# Patient Record
Sex: Female | Born: 1935 | Race: White | Hispanic: No | Smoking: Former smoker
Health system: Southern US, Community
[De-identification: ages and names within clinical notes are randomized; demographics above are authoritative.]

## PROBLEM LIST (undated history)

## (undated) DIAGNOSIS — I2510 Atherosclerotic heart disease of native coronary artery without angina pectoris: Secondary | ICD-10-CM

## (undated) DIAGNOSIS — E785 Hyperlipidemia, unspecified: Secondary | ICD-10-CM

## (undated) DIAGNOSIS — G4730 Sleep apnea, unspecified: Secondary | ICD-10-CM

## (undated) DIAGNOSIS — Z8639 Personal history of other endocrine, nutritional and metabolic disease: Secondary | ICD-10-CM

## (undated) DIAGNOSIS — I10 Essential (primary) hypertension: Secondary | ICD-10-CM

## (undated) DIAGNOSIS — R0600 Dyspnea, unspecified: Secondary | ICD-10-CM

## (undated) DIAGNOSIS — A0811 Acute gastroenteropathy due to Norwalk agent: Secondary | ICD-10-CM

## (undated) DIAGNOSIS — I341 Nonrheumatic mitral (valve) prolapse: Secondary | ICD-10-CM

## (undated) DIAGNOSIS — I779 Disorder of arteries and arterioles, unspecified: Secondary | ICD-10-CM

## (undated) DIAGNOSIS — IMO0002 Reserved for concepts with insufficient information to code with codable children: Secondary | ICD-10-CM

## (undated) DIAGNOSIS — M4802 Spinal stenosis, cervical region: Secondary | ICD-10-CM

## (undated) DIAGNOSIS — I639 Cerebral infarction, unspecified: Secondary | ICD-10-CM

## (undated) DIAGNOSIS — I739 Peripheral vascular disease, unspecified: Secondary | ICD-10-CM

## (undated) DIAGNOSIS — R001 Bradycardia, unspecified: Secondary | ICD-10-CM

## (undated) HISTORY — PX: CAROTID ENDARTERECTOMY: SUR193

## (undated) HISTORY — DX: Reserved for concepts with insufficient information to code with codable children: IMO0002

## (undated) HISTORY — PX: CHOLECYSTECTOMY: SHX55

## (undated) HISTORY — PX: CORONARY ARTERY BYPASS GRAFT: SHX141

## (undated) HISTORY — PX: ABDOMINAL HYSTERECTOMY: SUR658

## (undated) HISTORY — DX: Hyperlipidemia, unspecified: E78.5

## (undated) HISTORY — DX: Nonrheumatic mitral (valve) prolapse: I34.1

## (undated) HISTORY — DX: Atherosclerotic heart disease of native coronary artery without angina pectoris: I25.10

## (undated) HISTORY — DX: Bradycardia, unspecified: R00.1

## (undated) HISTORY — DX: Essential (primary) hypertension: I10

## (undated) HISTORY — DX: Acute gastroenteropathy due to Norwalk agent: A08.11

## (undated) HISTORY — DX: Spinal stenosis, cervical region: M48.02

## (undated) HISTORY — DX: Cerebral infarction, unspecified: I63.9

## (undated) HISTORY — DX: Personal history of other endocrine, nutritional and metabolic disease: Z86.39

## (undated) HISTORY — PX: BLADDER SUSPENSION: SHX72

---

## 2004-03-29 ENCOUNTER — Inpatient Hospital Stay: Payer: Self-pay | Admitting: Internal Medicine

## 2004-09-05 ENCOUNTER — Inpatient Hospital Stay (HOSPITAL_COMMUNITY): Admission: AD | Admit: 2004-09-05 | Discharge: 2004-09-07 | Payer: Self-pay | Admitting: Cardiology

## 2004-09-05 ENCOUNTER — Ambulatory Visit: Payer: Self-pay

## 2004-09-05 ENCOUNTER — Ambulatory Visit: Payer: Self-pay | Admitting: *Deleted

## 2004-09-22 ENCOUNTER — Ambulatory Visit: Payer: Self-pay | Admitting: Cardiology

## 2005-01-28 ENCOUNTER — Emergency Department: Payer: Self-pay | Admitting: Emergency Medicine

## 2005-04-21 ENCOUNTER — Ambulatory Visit: Payer: Self-pay | Admitting: Cardiology

## 2006-01-16 ENCOUNTER — Ambulatory Visit: Payer: Self-pay | Admitting: Cardiology

## 2006-01-29 ENCOUNTER — Ambulatory Visit: Payer: Self-pay | Admitting: Unknown Physician Specialty

## 2006-02-26 ENCOUNTER — Ambulatory Visit: Payer: Self-pay

## 2006-04-03 ENCOUNTER — Ambulatory Visit: Payer: Self-pay | Admitting: Cardiology

## 2006-04-11 ENCOUNTER — Ambulatory Visit (HOSPITAL_COMMUNITY): Admission: RE | Admit: 2006-04-11 | Discharge: 2006-04-11 | Payer: Self-pay | Admitting: Cardiology

## 2006-04-11 ENCOUNTER — Ambulatory Visit: Payer: Self-pay | Admitting: Cardiology

## 2006-05-02 ENCOUNTER — Ambulatory Visit: Payer: Self-pay | Admitting: Cardiology

## 2006-10-02 ENCOUNTER — Ambulatory Visit: Payer: Self-pay | Admitting: Unknown Physician Specialty

## 2006-10-29 ENCOUNTER — Ambulatory Visit: Payer: Self-pay | Admitting: Cardiology

## 2006-10-29 LAB — CONVERTED CEMR LAB
AST: 27 units/L (ref 0–37)
Chloride: 108 meq/L (ref 96–112)
Cholesterol: 140 mg/dL (ref 0–200)
GFR calc non Af Amer: 46 mL/min
Glucose, Bld: 104 mg/dL — ABNORMAL HIGH (ref 70–99)
HDL: 39.2 mg/dL (ref >39.0)
LDL Cholesterol: 73 mg/dL (ref 0–99)
Sodium: 143 meq/L (ref 135–145)
Total Bilirubin: 0.8 mg/dL (ref 0.3–1.2)
Total CHOL/HDL Ratio: 3.6
Total Protein: 7.4 g/dL (ref 6.0–8.3)
Triglycerides: 137 mg/dL (ref 0–149)

## 2006-12-28 ENCOUNTER — Ambulatory Visit: Payer: Self-pay | Admitting: Family Medicine

## 2007-01-28 ENCOUNTER — Ambulatory Visit: Payer: Self-pay | Admitting: Family Medicine

## 2007-01-31 ENCOUNTER — Ambulatory Visit: Payer: Self-pay | Admitting: Family Medicine

## 2007-02-13 ENCOUNTER — Ambulatory Visit: Payer: Self-pay | Admitting: Family Medicine

## 2007-02-14 ENCOUNTER — Ambulatory Visit: Payer: Self-pay | Admitting: Family Medicine

## 2007-04-23 ENCOUNTER — Ambulatory Visit: Payer: Self-pay | Admitting: Cardiology

## 2007-04-25 ENCOUNTER — Ambulatory Visit: Payer: Self-pay | Admitting: Cardiology

## 2007-07-30 ENCOUNTER — Ambulatory Visit: Payer: Self-pay | Admitting: Cardiology

## 2007-07-30 LAB — CONVERTED CEMR LAB
ALT: 16 units/L (ref 0–35)
Albumin: 4.2 g/dL (ref 3.5–5.2)
Alkaline Phosphatase: 110 units/L (ref 39–117)
Cholesterol: 131 mg/dL (ref 0–200)
LDL Cholesterol: 56 mg/dL (ref 0–99)
Triglycerides: 114 mg/dL (ref <150)

## 2007-10-17 ENCOUNTER — Ambulatory Visit: Payer: Self-pay | Admitting: Family Medicine

## 2007-11-08 ENCOUNTER — Ambulatory Visit: Payer: Self-pay | Admitting: Cardiology

## 2007-11-16 ENCOUNTER — Other Ambulatory Visit: Payer: Self-pay

## 2007-11-16 ENCOUNTER — Emergency Department: Payer: Self-pay | Admitting: Emergency Medicine

## 2007-12-18 ENCOUNTER — Ambulatory Visit: Payer: Self-pay | Admitting: Cardiology

## 2007-12-18 LAB — CONVERTED CEMR LAB
ALT: 20 U/L
AST: 25 U/L
Albumin: 4.3 g/dL
Alkaline Phosphatase: 102 U/L
BUN: 34 mg/dL — ABNORMAL HIGH
Bilirubin, Direct: 0.1 mg/dL
CO2: 23 meq/L
Calcium: 9.6 mg/dL
Chloride: 106 meq/L
Cholesterol: 138 mg/dL
Creatinine, Ser: 1.16 mg/dL
Glucose, Bld: 105 mg/dL — ABNORMAL HIGH
HDL: 45 mg/dL
Indirect Bilirubin: 0.4 mg/dL
LDL Cholesterol: 58 mg/dL
Potassium: 5.1 meq/L
Sodium: 142 meq/L
Total Bilirubin: 0.5 mg/dL
Total CHOL/HDL Ratio: 3.1
Total Protein: 7.3 g/dL
Triglycerides: 176 mg/dL — ABNORMAL HIGH
VLDL: 35 mg/dL

## 2008-03-27 ENCOUNTER — Ambulatory Visit: Payer: Self-pay | Admitting: Family Medicine

## 2008-04-28 ENCOUNTER — Ambulatory Visit: Payer: Self-pay | Admitting: Cardiovascular Disease

## 2008-06-23 ENCOUNTER — Ambulatory Visit: Payer: Federal, State, Local not specified - PPO | Admitting: Family Medicine

## 2008-07-27 ENCOUNTER — Ambulatory Visit: Payer: Self-pay | Admitting: Internal Medicine

## 2008-07-27 ENCOUNTER — Encounter: Payer: Self-pay | Admitting: Cardiovascular Disease

## 2008-07-27 LAB — CONVERTED CEMR LAB
Albumin: 4.3 g/dL (ref 3.5–5.2)
Alkaline Phosphatase: 105 units/L (ref 39–117)
Bilirubin, Direct: 0.1 mg/dL (ref 0.0–0.3)
HDL: 42 mg/dL (ref >39)
LDL Cholesterol: 63 mg/dL (ref 0–99)
Total Protein: 7.2 g/dL (ref 6.0–8.3)
Triglycerides: 209 mg/dL — ABNORMAL HIGH (ref <150)

## 2008-10-14 ENCOUNTER — Ambulatory Visit: Payer: Federal, State, Local not specified - PPO | Admitting: Family Medicine

## 2008-10-26 ENCOUNTER — Ambulatory Visit: Payer: Self-pay | Admitting: Cardiovascular Disease

## 2008-11-11 ENCOUNTER — Ambulatory Visit: Payer: Federal, State, Local not specified - PPO | Admitting: Family Medicine

## 2009-04-19 ENCOUNTER — Encounter (INDEPENDENT_AMBULATORY_CARE_PROVIDER_SITE_OTHER): Payer: Self-pay | Admitting: *Deleted

## 2009-05-20 ENCOUNTER — Encounter: Payer: Self-pay | Admitting: Cardiovascular Disease

## 2009-05-25 ENCOUNTER — Ambulatory Visit: Payer: Self-pay | Admitting: Cardiovascular Disease

## 2009-05-25 DIAGNOSIS — I25718 Atherosclerosis of autologous vein coronary artery bypass graft(s) with other forms of angina pectoris: Secondary | ICD-10-CM

## 2009-05-25 DIAGNOSIS — R0609 Other forms of dyspnea: Secondary | ICD-10-CM

## 2009-05-26 ENCOUNTER — Encounter: Payer: Self-pay | Admitting: Cardiovascular Disease

## 2009-05-28 LAB — CONVERTED CEMR LAB
CO2: 24 meq/L (ref 19–32)
Calcium: 9.8 mg/dL (ref 8.4–10.5)
Chloride: 103 meq/L (ref 96–112)
Sodium: 139 meq/L (ref 135–145)

## 2009-05-31 ENCOUNTER — Telehealth (INDEPENDENT_AMBULATORY_CARE_PROVIDER_SITE_OTHER): Payer: Self-pay | Admitting: *Deleted

## 2009-06-01 ENCOUNTER — Ambulatory Visit: Payer: Self-pay | Admitting: Cardiovascular Disease

## 2009-06-01 ENCOUNTER — Encounter (HOSPITAL_COMMUNITY): Admission: RE | Admit: 2009-06-01 | Discharge: 2009-06-17 | Payer: Self-pay | Admitting: Cardiovascular Disease

## 2009-06-01 ENCOUNTER — Ambulatory Visit: Payer: Self-pay

## 2009-06-01 ENCOUNTER — Encounter: Payer: Self-pay | Admitting: Cardiovascular Disease

## 2009-06-01 ENCOUNTER — Ambulatory Visit (HOSPITAL_COMMUNITY): Admission: RE | Admit: 2009-06-01 | Discharge: 2009-06-01 | Payer: Self-pay | Admitting: Cardiology

## 2009-06-08 ENCOUNTER — Ambulatory Visit: Payer: Self-pay | Admitting: Cardiovascular Disease

## 2009-06-09 LAB — CONVERTED CEMR LAB
BUN: 24 mg/dL — ABNORMAL HIGH (ref 6–23)
CO2: 16 meq/L — ABNORMAL LOW (ref 19–32)
Chloride: 107 meq/L (ref 96–112)
Glucose, Bld: 129 mg/dL — ABNORMAL HIGH (ref 70–99)
Pro B Natriuretic peptide (BNP): 48.1 pg/mL (ref 0.0–100.0)
Sodium: 141 meq/L (ref 135–145)

## 2009-06-17 ENCOUNTER — Ambulatory Visit: Payer: Federal, State, Local not specified - PPO | Admitting: Surgery

## 2009-06-21 ENCOUNTER — Ambulatory Visit: Payer: Self-pay | Admitting: Cardiovascular Disease

## 2009-06-22 ENCOUNTER — Encounter: Payer: Self-pay | Admitting: Cardiovascular Disease

## 2009-06-22 LAB — CONVERTED CEMR LAB
Lymphs Abs: 2.9 10*3/uL
MCHC: 32.7 g/dL
MCV: 92 fL
Monocytes Absolute: 0.9 10*3/uL
Neutro Abs: 5.4 10*3/uL

## 2009-08-23 ENCOUNTER — Ambulatory Visit: Payer: Federal, State, Local not specified - PPO | Admitting: Family Medicine

## 2009-10-12 ENCOUNTER — Ambulatory Visit: Payer: Federal, State, Local not specified - PPO | Admitting: Surgery

## 2009-11-16 ENCOUNTER — Telehealth: Payer: Self-pay | Admitting: Cardiovascular Disease

## 2009-11-22 ENCOUNTER — Telehealth (INDEPENDENT_AMBULATORY_CARE_PROVIDER_SITE_OTHER): Payer: Self-pay | Admitting: *Deleted

## 2009-11-22 ENCOUNTER — Telehealth: Payer: Self-pay | Admitting: Cardiovascular Disease

## 2009-11-24 ENCOUNTER — Telehealth: Payer: Self-pay | Admitting: Cardiovascular Disease

## 2009-12-21 ENCOUNTER — Ambulatory Visit: Payer: Self-pay

## 2009-12-21 ENCOUNTER — Encounter: Payer: Self-pay | Admitting: Cardiology

## 2009-12-21 DIAGNOSIS — I6529 Occlusion and stenosis of unspecified carotid artery: Secondary | ICD-10-CM

## 2010-01-21 ENCOUNTER — Encounter: Payer: Self-pay | Admitting: Cardiovascular Disease

## 2010-01-24 ENCOUNTER — Encounter: Payer: Self-pay | Admitting: Cardiovascular Disease

## 2010-01-31 ENCOUNTER — Ambulatory Visit: Payer: Self-pay | Admitting: Cardiovascular Disease

## 2010-01-31 DIAGNOSIS — R42 Dizziness and giddiness: Secondary | ICD-10-CM

## 2010-01-31 DIAGNOSIS — E785 Hyperlipidemia, unspecified: Secondary | ICD-10-CM

## 2010-02-01 ENCOUNTER — Encounter: Payer: Self-pay | Admitting: Cardiovascular Disease

## 2010-06-30 ENCOUNTER — Encounter: Payer: Self-pay | Admitting: Cardiovascular Disease

## 2010-07-17 LAB — CONVERTED CEMR LAB
ALT: 19 units/L
AST: 25 units/L
Alkaline Phosphatase: 127 units/L
BUN: 36 mg/dL
Calcium: 10.2 mg/dL
Chloride: 102 meq/L
Glomerular Filtration Rate, Af Am: 49 mL/min/{1.73_m2}
Total Bilirubin: 0.3 mg/dL
Total Protein: 7.4 g/dL

## 2010-07-19 NOTE — Miscellaneous (Signed)
Summary: Orders Update  Clinical Lists Changes  Problems: Added new problem of CAROTID ARTERY DISEASE (ICD-433.10) Orders: Added new Test order of Carotid Duplex (Carotid Duplex) - Signed 

## 2010-07-19 NOTE — Assessment & Plan Note (Signed)
Summary: F1M/AMD  Medications Added D 1000 PLUS  TABS (FA-CYANOCOBALAMIN-B6-D-CA) 2 by mouth daily LIPITOR 80 MG TABS (ATORVASTATIN CALCIUM) Take one tablet by mouth daily.      Allergies Added:   Visit Type:  Follow-up Primary Provider:  Dr.Maloney  CC:  SOB somewhat improved since starting spiriva and symbicort; lightheaded and dizzy on occasion, infected navel- resolving, and lump in L breast.  History of Present Illness: this is an 77 year-old woman with CAD s/p CABG presenting today for follow-up evaluation. She presented last month with progressive dyspnea and was evaluated with an echo and myoview stress test. The echo showed normal LV function without valvular abnormalities and the stress test was negative for ischemia.   She has been started on spiriva and symbicort and reports improvement in her breathing. Denies chest pain or edema. No other complaints at present.  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  Current Medications (verified): 1)  Amlodipine Besylate 10 Mg Tabs (Amlodipine Besylate) .... Take One Tablet By Mouth Daily 2)  Prevacid 30 Mg Cpdr (Lansoprazole) .Marland Kitchen.. 1 By Mouth Daily 3)  Detrol La 4 Mg Xr24h-Cap (Tolterodine Tartrate) .Marland Kitchen.. 1 By Mouth Daily 4)  Levothyroxine Sodium 25 Mcg Tabs (Levothyroxine Sodium) .Marland Kitchen.. 1 By Mouth Daily 5)  Sertraline Hcl 50 Mg Tabs (Sertraline Hcl) .Marland Kitchen.. 1 By Mouth Daily 6)  Aspirin Ec 325 Mg Tbec (Aspirin) .... Take One Tablet By Mouth Daily 7)  Calcium Citrate +  Tabs (Multiple Minerals-Vitamins) .Marland Kitchen.. 1 By Mouth Daily 8)  Centrum Silver  Tabs (Multiple Vitamins-Minerals) .Marland Kitchen.. 1 By Mouth Daily 9)  Ocuvite Preservision  Tabs (Multiple Vitamins-Minerals) .Marland Kitchen.. 1 By Mouth Daily 10)  Coenzyme Q10 100 Mg Caps (Coenzyme Q10) .Marland Kitchen.. 1 By Mouth Daily 11)  D 1000 Plus  Tabs (Fa-Cyanocobalamin-B6-D-Ca) .... 2 By Mouth Daily 12)  Lipitor 80 Mg Tabs (Atorvastatin Calcium) .... Take One Tablet By Mouth Daily. 13)   Nitroglycerine Er 2.5mg  Capsule .... Take One Capsule P.o. Two Times A Day 14)  Nitroglycerin 0.4 Mg/hr Pt24 (Nitroglycerin) .... As Needed 15)  Diazepam 5 Mg Tabs (Diazepam) .... As Needed 16)  Symbicort 80-4.5 Mcg/act Aero (Budesonide-Formoterol Fumarate) .... Use 1 Puff Every Morning and Every Evening 17)  Spiriva Handihaler 18 Mcg Caps (Tiotropium Bromide Monohydrate) .... Once Daily 18)  Furosemide 40 Mg Tabs (Furosemide) .... Take One Tablet By Mouth Daily. 19)  Klor-Con M20 20 Meq Cr-Tabs (Potassium Chloride Crys Cr) .Marland Kitchen.. 1 Tab By Mouth Daily  Allergies (verified): 1)  ! Sulfa  Past History:  Past medical history reviewed for relevance to current acute and chronic problems.  Past Medical History: Reviewed history from 05/25/2009 and no changes required. CAD status post CABG Hyperlipidemia Hypertension History of fatigue and bradycardia with beta blockade. History of hyperkalemia Cerebrovascular Disease status post carotid endarterectomy Left brain stroke in the 1980's Mitral Valve Prolapse Osteoporsis Asthma  Vital Signs:  Patient profile:   77 year old female Weight:      186.25 pounds Pulse rate:   68 / minute Pulse rhythm:   regular Resp:     16 per minute BP sitting:   122 / 56  (right arm) Cuff size:   large  Vitals Entered By: Charlena Cross, RN, BSN (June 21, 2009 2:19 PM)  Physical Exam  General:  Pt is alert and oriented, elderly woman, in no acute distress. HEENT: normal Neck: normal carotid upstrokes without bruits, JVP normal Lungs: CTA CV: RRR without murmur  or gallop Abd: soft, NT, positive BS, no bruit, no organomegaly Ext: no clubbing, cyanosis, or edema. peripheral pulses 2+ and equal Skin: warm and dry without rash    Impression & Recommendations:  Problem # 1:  DYSPNEA ON EXERTION (ICD-786.09) This is predominately related to pulmonary disease. The pt's echo and stress myoview revealed normal LV function and no areas of  ischemia. Her BNP was normal and physical exam shows no signs of volume overload. I have recommended to discontinue furosemide (this was started at the time of her last evaluation).  Recommend f/u in one year with Dr Mariah Milling.  The following medications were removed from the medication list:    Furosemide 40 Mg Tabs (Furosemide) .Marland Kitchen... Take one tablet by mouth daily. Her updated medication list for this problem includes:    Amlodipine Besylate 10 Mg Tabs (Amlodipine besylate) .Marland Kitchen... Take one tablet by mouth daily    Aspirin Ec 325 Mg Tbec (Aspirin) .Marland Kitchen... Take one tablet by mouth daily  Problem # 2:  CORONARY ATHEROSLERO AUTOL VEIN BYPASS GRAFT (ICD-414.02) Pt is stable without angina. Myoview results as above. Continue current medical therapy.  Her updated medication list for this problem includes:    Amlodipine Besylate 10 Mg Tabs (Amlodipine besylate) .Marland Kitchen... Take one tablet by mouth daily    Aspirin Ec 325 Mg Tbec (Aspirin) .Marland Kitchen... Take one tablet by mouth daily    Nitroglycerin 0.4 Mg/hr Pt24 (Nitroglycerin) .Marland Kitchen... As needed

## 2010-07-19 NOTE — Miscellaneous (Signed)
  Clinical Lists Changes  Observations: Added new observation of ABSOLUTE BAS: 0.0 K/uL (06/22/2009 9:09) Added new observation of EOS ABSLT: 0.3 K/uL (06/22/2009 9:09) Added new observation of ABSOLUTE MON: 0.9 K/uL (06/22/2009 9:09) Added new observation of ABS LYMPHOCY: 2.9 K/uL (06/22/2009 9:09) Added new observation of ABS NEUTROPH: 5.4 K/uL (06/22/2009 9:09) Added new observation of MCHC RBC: 32.7 g/dL (17/51/0258 5:27) Added new observation of PLATELETK/UL: 284 K/uL (06/22/2009 9:09) Added new observation of RDW: 14.5 % (06/22/2009 9:09) Added new observation of MCV: 92 fL (06/22/2009 9:09) Added new observation of HCT: 38.5 % (06/22/2009 9:09) Added new observation of HGB: 12.6 g/dL (78/24/2353 6:14) Added new observation of RBC M/UL: 4.19 M/uL (06/22/2009 9:09) Added new observation of WBC COUNT: 9.5 10*3/microliter (06/22/2009 9:09) Added new observation of GFR AA: 49 mL/min/1.67m2 (05/20/2009 9:09) Added new observation of GFR: 40 mL/min (05/20/2009 9:09) Added new observation of ALBUMIN: 4.5 g/dL (43/15/4008 6:76) Added new observation of PROTEIN, TOT: 7.4 g/dL (19/50/9326 7:12) Added new observation of CALCIUM: 10.2 mg/dL (45/80/9983 3:82) Added new observation of ALK PHOS: 127 units/L (05/20/2009 9:09) Added new observation of BILI TOTAL: 0.3 mg/dL (50/53/9767 3:41) Added new observation of SGPT (ALT): 19 units/L (05/20/2009 9:09) Added new observation of SGOT (AST): 25 units/L (05/20/2009 9:09) Added new observation of CO2 PLSM/SER: 20 meq/L (05/20/2009 9:09) Added new observation of CL SERUM: 102 meq/L (05/20/2009 9:09) Added new observation of K SERUM: 4.7 meq/L (05/20/2009 9:09) Added new observation of NA: 142 meq/L (05/20/2009 9:09) Added new observation of CREATININE: 1.27 mg/dL (93/79/0240 9:73) Added new observation of BUN: 36 mg/dL (53/29/9242 6:83) Added new observation of BG RANDOM: 105 mg/dL (41/96/2229 7:98)

## 2010-07-19 NOTE — Progress Notes (Signed)
Summary: RX NTG 2.5mg   Medications Added NITROGLYCERIN CR 2.5 MG CR-CAPS (NITROGLYCERIN) Take 1 tablet by mouth two times a day       Phone Note Refill Request Call back at Home Phone [phone] Message from:  Patient on November 24, 2009 4:43 PM  Refills Requested: Medication #1:  NITROGLYCERINE ER 2.5MG  CAPSULE take one capsule p.o. two times a day WOULD LIKE A 180 DAY RX CALLED INTO [facility] ON [address] IN [place]  Initial call taken by: Harlon Flor,  November 24, 2009 4:44 PM  Follow-up for Phone Call        [facility] regarding dose of NTG.    New/Updated Medications: NITROGLYCERIN CR 2.5 MG CR-CAPS (NITROGLYCERIN) Take 1 tablet by mouth two times a day Prescriptions: NITROGLYCERIN CR 2.5 MG CR-CAPS (NITROGLYCERIN) Take 1 tablet by mouth two times a day  #180 x 3   Entered by:   Bishop Dublin, CMA   Authorized by:   Norva Karvonen, MD   Signed by:   Bishop Dublin, CMA on 11/29/2009   Method used:   Electronically to        [facility]* (retail)             ,          Ph: [phone]       Fax: [phone]   RxID:   [id number]

## 2010-07-19 NOTE — Progress Notes (Signed)
Summary: Refill Lipitor   Phone Note Refill Request   Refills Requested: Medication #1:  LIPITOR 80 MG TABS Take one tablet by mouth daily. Needs Lipitor refilled  through [facility] not regular pharmacy     Prescriptions: LIPITOR 80 MG TABS (ATORVASTATIN CALCIUM) Take one tablet by mouth daily.  #30 x 1   Entered by:   Bishop Dublin, CMA   Authorized by:   Dossie Arbour MD   Signed by:   Bishop Dublin, CMA on 11/22/2009   Method used:   Electronically to        [facility] [address] [phone]* (retail)       [address] [place], [place]  [id number]       Ph: [phone]       Fax: [phone]   RxID:   [id number]

## 2010-07-19 NOTE — Assessment & Plan Note (Signed)
Summary: EC6/AMD  Medications Added AMLODIPINE BESYLATE 10 MG TABS (AMLODIPINE BESYLATE) Take one tablet by mouth daily in the evening LEVOTHROID 75 MCG TABS (LEVOTHYROXINE SODIUM) one tablet once daily      Allergies Added:   Visit Type:  Follow-up Primary Tawny Raspberry:  Dr.Maloney  CC:  c/o dizziness and shortness of breath.  Patient gets dizzy after gets up and walks about 10-12 ft..  History of Present Illness: 77 year-old woman with CAD s/p CABG, diastolic dysfunction, mildly dilated left atrium, mild MR, presenting today for follow-up evaluation. She presented several months ago with progressive dyspnea and was evaluated with an echo and myoview stress test.   She has been started on spiriva and symbicort and reports improvement in her breathing. Denies chest pain or edema. No other complaints at present.  she has had some dizziness when standing. It does not happen every time though happens frequently, worse when she starts to walk after standing.  Blood work done by Dr. Elease Hashimoto shows creatinine 1.46, BUN 34, GFR 33.  Previous echo in 05/2009 showed normal LV function without valvular abnormalities  stress test was negative for ischemia.    Current Medications (verified): 1)  Amlodipine Besylate 10 Mg Tabs (Amlodipine Besylate) .... Take One Tablet By Mouth Daily 2)  Prevacid 30 Mg Cpdr (Lansoprazole) .Marland Kitchen.. 1 By Mouth Daily 3)  Detrol La 4 Mg Xr24h-Cap (Tolterodine Tartrate) .Marland Kitchen.. 1 By Mouth Daily 4)  Levothroid 75 Mcg Tabs (Levothyroxine Sodium) .... One Tablet Once Daily 5)  Sertraline Hcl 50 Mg Tabs (Sertraline Hcl) .Marland Kitchen.. 1 By Mouth Daily 6)  Aspirin Ec 325 Mg Tbec (Aspirin) .... Take One Tablet By Mouth Daily 7)  Calcium Citrate +  Tabs (Multiple Minerals-Vitamins) .Marland Kitchen.. 1 By Mouth Daily 8)  Ocuvite Preservision  Tabs (Multiple Vitamins-Minerals) .Marland Kitchen.. 1 By Mouth Daily 9)  Coenzyme Q10 100 Mg Caps (Coenzyme Q10) .Marland Kitchen.. 1 By Mouth Daily 10)  D 1000 Plus  Tabs  (Fa-Cyanocobalamin-B6-D-Ca) .... 2 By Mouth Daily 11)  Lipitor 80 Mg Tabs (Atorvastatin Calcium) .... Take One Tablet By Mouth Daily. 12)  Nitroglycerin Cr 2.5 Mg Cr-Caps (Nitroglycerin) .... Take 1 Tablet By Mouth Two Times A Day 13)  Nitroglycerin 0.4 Mg/hr Pt24 (Nitroglycerin) .... As Needed 14)  Diazepam 5 Mg Tabs (Diazepam) .... As Needed 15)  Symbicort 80-4.5 Mcg/act Aero (Budesonide-Formoterol Fumarate) .... Use 1 Puff Every Morning and Every Evening 16)  Spiriva Handihaler 18 Mcg Caps (Tiotropium Bromide Monohydrate) .... Once Daily  Allergies (verified): 1)  ! Sulfa  Past History:  Past Medical History: Last updated: 05/25/2009 CAD status post CABG Hyperlipidemia Hypertension History of fatigue and bradycardia with beta blockade. History of hyperkalemia Cerebrovascular Disease status post carotid endarterectomy Left brain stroke in the 1980's Mitral Valve Prolapse Osteoporsis Asthma  Past Surgical History: Last updated: 05/25/2009 CABG Left carotid endarterectomy performed in [place] Cholecystectomy hysterectomy bladder suspension  Family History: Last updated: 11/06/08  Father died at 80 of stroke.  Mother died at 56 of  throat  cancer.  Three siblings are alive all with hypertension.  Her daughter is  alive and well at 59.  Social History: Last updated: 2008/11/06 The patient is a retired Catering manager.  She is widowed with a  daughter who lives in [place].  Gets no exercise.  Enjoys reading.  She  never smoked.  Denies alcohol and illicit drug use.  Review of Systems  The patient denies fever, weight loss, weight gain, vision loss, decreased hearing, hoarseness, chest pain, syncope, dyspnea on  exertion, peripheral edema, prolonged cough, abdominal pain, incontinence, muscle weakness, depression, and enlarged lymph nodes.         Dizzy with standing.   Vital Signs:  Patient profile:   77 year old female Height:      68 inches Weight:      174  pounds BMI:     26.55 Pulse rate:   64 / minute BP sitting:   148 / 83  (left arm) Cuff size:   regular  Vitals Entered By: Bishop Dublin, CMA (January 31, 2010 11:10 AM)  Physical Exam  General:  Well developed, well nourished, in no acute distress. Head:  normocephalic and atraumatic Neck:  Neck supple, no JVD. No masses, thyromegaly or abnormal cervical nodes. Chest Wall:  no deformities or breast masses noted Lungs:  Clear bilaterally to auscultation and percussion. Heart:  Non-displaced PMI, chest non-tender; regular rate and rhythm, S1, S2 with I/VI SEM RSB, no rubs or gallops. Carotid upstroke normal, no bruit.  Pedals normal pulses. No edema, no varicosities. Abdomen:  Bowel sounds positive; abdomen soft and non-tender without masses Msk:  Back normal, normal gait. Muscle strength and tone normal. Pulses:  pulses normal in all 4 extremities Extremities:  No clubbing or cyanosis. Neurologic:  Alert and oriented x 3. Skin:  Intact without lesions or rashes. Psych:  Normal affect.   Impression & Recommendations:  Problem # 1:  CAROTID ARTERY DISEASE (ICD-433.10) history of CAD with bypass. No symptoms of angina. Recent negative stress test.  Her updated medication list for this problem includes:    Aspirin Ec 325 Mg Tbec (Aspirin) .Marland Kitchen... Take one tablet by mouth daily  Orders: T-Lipid Profile [phone]) T-Hepatic Function [phone])  Problem # 2:  HYPERLIPIDEMIA (ICD-272.4) It has been over a year since her cholesterol was checked per her report. We will give her a lab slip for a lipid panel To be done at her convenience.  Her updated medication list for this problem includes:    Lipitor 80 Mg Tabs (Atorvastatin calcium) .Marland Kitchen... Take one tablet by mouth daily.  Orders: T-Hepatic Function [id number])  Problem # 3:  DIZZINESS (ICD-780.4) I suspect that her symptoms of dizziness might be due to orthostasis. Her BUN and creatinine are elevated compared to 6  months ago. She might be mildly dehydrated. I have asked her to increase her p.o. fluid intake. We will also change the amlodipine to  dosing in the p.m. to see if this helps with her symptoms. If she continues to have dizziness, and asked her to cut her amlodipine in half.  Patient Instructions: 1)  Your physician recommends that you return for a FASTING lipid profile: at [facility] 2)  Your physician has recommended you make the following change in your medication: START taking your amolodipin in the PM if you continue being dizzy you can cut the pill in half.  3)  Your physician wants you to follow-up in:   6 months You will receive a reminder letter in the mail two months in advance. If you don't receive a letter, please call our office to schedule the follow-up appointment. Prescriptions: LIPITOR 80 MG TABS (ATORVASTATIN CALCIUM) Take one tablet by mouth daily.  #90 x 3   Entered by:   Bishop Dublin, CMA   Authorized by:   Dossie Arbour MD   Signed by:   Bishop Dublin, CMA on 01/31/2010   Method used:   Electronically to        [facility]* (retail)             ,  Ph: [phone]       Fax: [phone]   RxID:   [id number]

## 2010-07-19 NOTE — Progress Notes (Signed)
   Phone Note Outgoing Call   Summary of Call: CMA S/W with pharmacy adivsed to disregard Rx. Rx. being sent to [facility] today. Bishop Dublin, CMA  November 22, 2009 11:27 AM

## 2010-07-19 NOTE — Progress Notes (Signed)
Summary: RX   Phone Note Refill Request Call back at Home Phone [phone] Message from:  Patient on Nov 16, 2009 9:56 AM  Refills Requested: Medication #1:  LIPITOR 80 MG TABS Take one tablet by mouth daily.   Notes: [facility]  Medication #2:  NITROGLYCERINE ER 2.5MG  CAPSULE take one capsule p.o. two times a day   Notes: [facility] #[id number] Initial call taken by: Harlon Flor,  Nov 16, 2009 9:57 AM

## 2010-07-21 ENCOUNTER — Ambulatory Visit: Admit: 2010-07-21 | Payer: Self-pay | Admitting: Cardiovascular Disease

## 2010-07-21 ENCOUNTER — Encounter: Payer: Self-pay | Admitting: Cardiovascular Disease

## 2010-07-21 ENCOUNTER — Ambulatory Visit (INDEPENDENT_AMBULATORY_CARE_PROVIDER_SITE_OTHER): Payer: Medicare Other | Admitting: Cardiovascular Disease

## 2010-07-21 DIAGNOSIS — R42 Dizziness and giddiness: Secondary | ICD-10-CM

## 2010-07-21 DIAGNOSIS — I739 Peripheral vascular disease, unspecified: Secondary | ICD-10-CM

## 2010-07-21 DIAGNOSIS — R1084 Generalized abdominal pain: Secondary | ICD-10-CM | POA: Insufficient documentation

## 2010-07-21 DIAGNOSIS — E785 Hyperlipidemia, unspecified: Secondary | ICD-10-CM

## 2010-07-21 DIAGNOSIS — I2510 Atherosclerotic heart disease of native coronary artery without angina pectoris: Secondary | ICD-10-CM

## 2010-07-25 ENCOUNTER — Telehealth: Payer: Self-pay | Admitting: Cardiovascular Disease

## 2010-07-27 NOTE — Assessment & Plan Note (Signed)
Summary: 6 MONTH F/U/SAB   Vital Signs:  Patient profile:   77 year old female Height:      68 inches Weight:      172.25 pounds BMI:     26.29 Pulse rate:   69 / minute BP sitting:   139 / 72  (left arm) Cuff size:   regular  Vitals Entered By: Lysbeth Galas CMA (July 21, 2010 10:45 AM)   Visit Type:  Follow-up Primary Provider:  Dr.Maloney  CC:  c/o SOB due to COPD and chest pains. Pt seems to think chest pain is due to her diverticulosis.Marland Kitchen  History of Present Illness: 77 year-old woman with CAD s/p CABG, diastolic dysfunction, carotid endarterectomy on the left 10 years ago, mildly dilated left atrium, mild MR, presenting today for follow-up evaluation. she had dizziness in the past and her amlodipine was decreased from 10 mg to 5 mg.  She has several issues on today's visit. She does have some dizziness that has come back again. It happens when she stands and last for several minutes. Sitting, her blood pressure has been in the 120s to 130s, rarely 140s.  She also reports abdominal pain radiating around her sides which she attributes to her gallbladder. It comes on for 15 minutes at a time, 2 hours after she eats, one time per week. She does have problems with constipation. Symptoms started last summer.  Previous echo in 05/2009 showed normal LV function without valvular abnormalities  stress test was negative for ischemia.   carotid ultrasound last year showed mild bilateral disease  labs show total cholesterol 130, LDL 56, HDL 54  EKG shows normal sinus rhythm with a rate of 69 beats per minute, no significant ST or T wave changes   Current Medications (verified): 1)  Amlodipine Besylate 10 Mg Tabs (Amlodipine Besylate) .... Take One Tablet By Mouth Daily in The Evening 2)  Prevacid 30 Mg Cpdr (Lansoprazole) .Marland Kitchen.. 1 By Mouth Daily 3)  Detrol La 4 Mg Xr24h-Cap (Tolterodine Tartrate) .Marland Kitchen.. 1 By Mouth Daily 4)  Levothroid 75 Mcg Tabs (Levothyroxine Sodium) .... One  Tablet Once Daily 5)  Sertraline Hcl 50 Mg Tabs (Sertraline Hcl) .Marland Kitchen.. 1 By Mouth Daily 6)  Aspirin Ec 325 Mg Tbec (Aspirin) .... Take One Tablet By Mouth Daily 7)  Calcium Citrate +  Tabs (Multiple Minerals-Vitamins) .Marland Kitchen.. 1 By Mouth Daily 8)  Ocuvite Preservision  Tabs (Multiple Vitamins-Minerals) .Marland Kitchen.. 1 By Mouth Daily 9)  D 1000 Plus  Tabs (Fa-Cyanocobalamin-B6-D-Ca) .... 2 By Mouth Daily 10)  Lipitor 80 Mg Tabs (Atorvastatin Calcium) .... Take One Tablet By Mouth Daily. 11)  Nitroglycerin Cr 2.5 Mg Cr-Caps (Nitroglycerin) .... Take 1 Tablet By Mouth Two Times A Day 12)  Nitroglycerin 0.4 Mg/hr Pt24 (Nitroglycerin) .... As Needed 13)  Diazepam 5 Mg Tabs (Diazepam) .... As Needed 14)  Symbicort 160-4.5 Mcg/act Aero (Budesonide-Formoterol Fumarate) .... Two Times A Day 15)  Spiriva Handihaler 18 Mcg Caps (Tiotropium Bromide Monohydrate) .... Once Daily  Allergies (verified): 1)  ! Sulfa  Past History:  Past Medical History: Last updated: 05/25/2009 CAD status post CABG Hyperlipidemia Hypertension History of fatigue and bradycardia with beta blockade. History of hyperkalemia Cerebrovascular Disease status post carotid endarterectomy Left brain stroke in the 1980's Mitral Valve Prolapse Osteoporsis Asthma  Past Surgical History: Last updated: 05/25/2009 CABG Left carotid endarterectomy performed in [place] Cholecystectomy hysterectomy bladder suspension  Family History: Last updated: 06-Nov-2008  Father died at 57 of stroke.  Mother died at 34  of  throat  cancer.  Three siblings are alive all with hypertension.  Her daughter is  alive and well at 60.  Social History: Last updated: 10/26/2008 The patient is a retired Catering manager.  She is widowed with a  daughter who lives in [place].  Gets no exercise.  Enjoys reading.  She  never smoked.  Denies alcohol and illicit drug use.  Review of Systems  The patient denies fever, weight loss, weight gain, vision loss,  decreased hearing, hoarseness, chest pain, syncope, dyspnea on exertion, peripheral edema, prolonged cough, abdominal pain, incontinence, muscle weakness, depression, and enlarged lymph nodes.         rare dizzy epsiodes, some stomach discomfort  Physical Exam  General:  Well developed, well nourished, in no acute distress. Head:  normocephalic and atraumatic Neck:  Neck supple, no JVD. No masses, thyromegaly or abnormal cervical nodes. Lungs:  Clear bilaterally to auscultation and percussion. Heart:  Non-displaced PMI, chest non-tender; regular rate and rhythm, S1, S2 with I/VI SEM RSB, no rubs or gallops. Carotid upstroke normal, no bruit.  Pedals normal pulses. No edema, no varicosities. Abdomen:  Bowel sounds positive; abdomen soft and non-tender without masses Msk:  Back normal, normal gait. Muscle strength and tone normal. Pulses:  pulses normal in all 4 extremities Extremities:  No clubbing or cyanosis. Neurologic:  Alert and oriented x 3. Skin:  Intact without lesions or rashes. Psych:  Normal affect.   Impression & Recommendations:  Problem # 1:  DIZZINESS (ICD-780.4) Etiology of her dizziness his likely secondary to orthostasis. We have suggested she try to decrease her amlodipine to 2.5 mg daily. If this helps alleviate her symptoms, we will write a new prescription for her. She has also been encouraged to drink more fluids. If she continues to have symptoms of dizziness, we will change her amlodipine medication.  she does have poor balance. We have recommended she participated in physical therapy at [facility].  Orders: EKG w/ Interpretation (93000)  Problem # 2:  HYPERLIPIDEMIA (ICD-272.4)  cholesterol is under excellent control on current medication. No changes made.  Her updated medication list for this problem includes:    Lipitor 80 Mg Tabs (Atorvastatin calcium) .Marland Kitchen... Take one tablet by mouth daily.  Problem # 3:  CORONARY ATHEROSLERO AUTOL VEIN BYPASS GRAFT  (ICD-414.02) No symptoms of angina at this time. No further testing needed.  Her updated medication list for this problem includes:    Amlodipine Besylate 5 Mg Tabs (Amlodipine besylate) .Marland Kitchen... Take one tablet by mouth daily in the evening    Aspirin Ec 325 Mg Tbec (Aspirin) .Marland Kitchen... Take one tablet by mouth daily    Nitroglycerin Cr 2.5 Mg Cr-caps (Nitroglycerin) .Marland Kitchen... Take 1 tablet by mouth two times a day    Nitroglycerin 0.4 Mg/hr Pt24 (Nitroglycerin) .Marland Kitchen... As needed  Problem # 4:  CAROTID ARTERY DISEASE (ICD-433.10) Mild bilateral carotid arterial disease bilaterally seen July 2011. She does have significant bruising on her arms. We will decrease her aspirin to 81 mg x2.  Her updated medication list for this problem includes:    Aspirin Ec 162 Mg Tbec (Aspirin) .Marland Kitchen... Take one tablet by mouth daily  Problem # 5:  ABDOMINAL PAIN-GENERALIZED (ICD-789.07) Etiology of her abdominal pain is uncertain. This could be secondary to hiatal hernia, GI pathology. She does have followup with a gastroenterologist in the near future.  Patient Instructions: 1)  Your physician recommends that you schedule a follow-up appointment in: 6 months 2)  Your physician recommends  that you continue on your current medications as directed. Please refer to the Current Medication list given to you today. 3)  Letter sent to [facility] to start Physical Therapy. Prescriptions: NITROGLYCERIN 0.4 MG/HR PT24 (NITROGLYCERIN) Take one tablet under the tongue every 5 minutes, up to 3 tablets as needed.  #25 x 3   Entered by:   Lanny Hurst RN   Authorized by:   Dossie Arbour MD   Signed by:   Lanny Hurst RN on 07/21/2010   Method used:   Electronically to        [facility] [address] [phone]* (retail)       [address] [place], [place]  [id number]       Ph: [phone]       Fax: [phone]   RxID:   [id number]    Orders Added: 1)  EKG w/ Interpretation [93000]  Appended Document: 6 MONTH  F/U/SAB Patch cancelled, resent rx for 2.5mg  tablets.   Clinical Lists Changes  Medications: Changed medication from NITROGLYCERIN CR 2.5 MG CR-CAPS (NITROGLYCERIN) Take 1 tablet by mouth two times a day to NITROGLYCERIN CR 2.5 MG CR-CAPS (NITROGLYCERIN) Take 1 tablet by mouth two times a day - Signed Rx of NITROGLYCERIN CR 2.5 MG CR-CAPS (NITROGLYCERIN) Take 1 tablet by mouth two times a day;  #60 x 6;  Signed;  Entered by: Lanny Hurst RN;  Authorized by: Dossie Arbour MD;  Method used: Electronically to [facility] [phone]*, [address], [place], [place]  [id number], Ph: [phone], Fax: [phone]    Prescriptions: NITROGLYCERIN CR 2.5 MG CR-CAPS (NITROGLYCERIN) Take 1 tablet by mouth two times a day  #60 x 6   Entered by:   Lanny Hurst RN   Authorized by:   Dossie Arbour MD   Signed by:   Lanny Hurst RN on 07/21/2010   Method used:   Electronically to        [facility]. [address] [phone]* (retail)       [address] [place], [place]  [address]       Ph: [phone]       Fax: [phone]   RxID:   [id number]

## 2010-07-27 NOTE — Miscellaneous (Signed)
Summary: Office Visit - Infectious Disease  Clinical Lists Changes  Medications: Removed medication of NITROGLYCERIN 0.4 MG/HR PT24 (NITROGLYCERIN) Take one tablet under the tongue every 5 minutes, up to 3 tablets as needed.

## 2010-07-27 NOTE — Letter (Signed)
Summary: [employer] [facility] at [address]   [place], [place] [address]   Phone: [phone]  Fax: [phone]        July 21, 2010 MRN: [id number]    Chelsea [name] [address] [place], [place]  [address]    To whom it may concern:  Patient will need physical therapy started for back and leg strengthening.         Sincerely,         Dossie Arbour, MD

## 2010-08-04 NOTE — Progress Notes (Signed)
Summary: NTG Rx  Medications Added NITROSTAT 0.4 MG SUBL (NITROGLYCERIN) 1 tablet under tongue at onset of chest pain; you may repeat every 5 minutes for up to 3 doses.       Phone Note Call from Patient   Caller: Patient Summary of Call: Pt called stating she needed refill of nitrostat 0.4mg  SL. Notified pt rx called in. Initial call taken by: Lanny Hurst RN,  July 25, 2010 9:11 AM    New/Updated Medications: NITROSTAT 0.4 MG SUBL (NITROGLYCERIN) 1 tablet under tongue at onset of chest pain; you may repeat every 5 minutes for up to 3 doses. Prescriptions: NITROSTAT 0.4 MG SUBL (NITROGLYCERIN) 1 tablet under tongue at onset of chest pain; you may repeat every 5 minutes for up to 3 doses.  #25 x 2   Entered by:   Lanny Hurst RN   Authorized by:   Dossie Arbour MD   Signed by:   Lanny Hurst RN on 07/25/2010   Method used:   Electronically to        [facility]. [address] [phone]* (retail)       [address] [place], [place]  [address]       Ph: [phone]       Fax: [phone]   RxID:   [id number]

## 2010-08-29 ENCOUNTER — Ambulatory Visit: Payer: Federal, State, Local not specified - PPO | Admitting: Unknown Physician Specialty

## 2010-09-06 NOTE — Miscellaneous (Signed)
Summary: Medical Information Form   Medical Information Form   Imported By: Roderic Ovens 08/29/2010 11:27:28  _____________________________________________________________________  External Attachment:    Type:   Image     Comment:   External Document

## 2010-09-08 ENCOUNTER — Ambulatory Visit: Payer: Federal, State, Local not specified - PPO | Admitting: Unknown Physician Specialty

## 2010-09-20 ENCOUNTER — Ambulatory Visit: Payer: Federal, State, Local not specified - PPO | Admitting: Specialist

## 2010-10-17 ENCOUNTER — Ambulatory Visit: Payer: Federal, State, Local not specified - PPO | Admitting: Family Medicine

## 2010-11-01 NOTE — Assessment & Plan Note (Signed)
[facility] OFFICE NOTE   NAME:Withrow, PALMA BUSTER                    MRN:          [id number]  DATE:04/28/2008                            DOB:          24-Jul-1923    Chelsea [name] is a delightful 77 year old woman seen in follow up at  the [facility] office in [place] on April 28, 2008.  She  has a history of coronary artery disease and underwent coronary bypass  surgery in 1990.  She also has cerebrovascular disease and has undergone  a left carotid endarterectomy.  She has been followed by Dr. Jens Som  and is changing over since he no longer comes to [place].   Ms. Kelsay is doing well from a symptomatic standpoint.  She has mild  bilateral calf pain with walking and also has stable exertional dyspnea.  She denies orthopnea, PND, or chest pain.  She does not recall her  symptoms back from the time of her bypass surgery.  She has not had  palpitations, lightheadedness, syncope, or edema.  She has seen Dr.  Earnestine Leys for her claudication symptoms, and apparently her ultrasound  studies have shown normal arterial and venous findings in the legs.   CURRENT MEDICATIONS:  1. Amlodipine 10 mg daily.  2. Prevacid 30 mg daily.  3. Nitroglycerin SR caps 1 p.o. daily.  4. Detrol LA 4 mg daily.  5. Levothyroxine 25 mcg daily.  6. Sertraline 50 mg daily.  7. Singulair 1 daily.  8. Aspirin 1 daily.  9. Citracal 800 mg twice daily.  10.Ocuvite daily.  11.Multivitamin daily.  12.CoQ10 1000 mg daily.  13.Vitamin D 1000 mg daily.  14.Lipitor 80 mg daily.   ALLERGIES:  NKDA.   PHYSICAL EXAMINATION:  GENERAL:  She is an alert and oriented elderly  woman no acute distress.  VITAL SIGNS:  Weight is 175 pounds, blood pressure 130/58, heart rate  56, respiratory rate 12.  HEENT:  Normal.  NECK:  Normal carotid upstrokes, no bruits.  JVP normal.  LUNGS:  Clear bilaterally.  HEART:  Bradycardic and  regular.  No murmurs or gallops.  ABDOMEN:  Soft, nontender.  No organomegaly.  No abdominal bruits.  EXTREMITIES:  No clubbing, cyanosis, or edema.  SKIN:  Warm and dry.  No rash.   EKG shows sinus bradycardia and otherwise is within normal limits.   ASSESSMENT:  1. Coronary artery disease status post coronary artery bypass graft.      The patient is stable with no angina.  Her last nuclear stress      study was in September 2007 and was negative for ischemia.  Her      gated left ventricular ejection fraction was 81%.  Continue medical      therapy as outlined above with a combination of aspirin, statin,      and treatment of her hypertension.  2. Hypertension.  Blood pressure well controlled on amlodipine.  No      changes were made to her medical regimen today.  3. Hyperlipidemia.  She is concerned about taking high-dose Lipitor.  She prefers to be on a lower dose and will be more comfortable with      this.  She is not having any symptoms at present, but has had some      problems with myalgias in the past.  Her last lipid panel from December 18, 2007, showed a cholesterol of 138, triglyceride 176, HDL 45, LDL      58.  I advised that it is okay to cut her dose back to 40 mg and      will repeat a lipid panel and liver function tests in 3 months.  4. Followup.  I would like to see Ms. Borghi back in 6 months or      sooner if any new problems arise.     Veverly Fells. Excell Seltzer, MD  Electronically Signed    MDC/MedQ  DD: 04/28/2008  DT: 04/29/2008  Job #: [id number]   cc:   Lorie Phenix

## 2010-11-01 NOTE — Assessment & Plan Note (Signed)
Chelsea [name]                            CARDIOLOGY OFFICE NOTE   NAME:Chelsea [name], Chelsea [name]                    MRN:          [id number]  DATE:04/22/2007                            DOB:          1923/09/04    Chelsea [name] is a pleasant female who has a history of coronary artery  disease, status post coronary artery bypass graft.  Her last Myoview was  in September 2007.  There was no sign of scar or ischemia and her  ejection fraction was 81%.  She also has a history of cerebrovascular  disease and will need followup carotid Dopplers in September 2009.  Note, the patient had peripheral arteriogram on April 11, 2006.  The  aorta was normal without plaque or aneurysm.  The iliac arteries were  normal bilaterally.  There was no renal artery stenosis noted.  Since I  last saw her, she has not had any substernal chest pain and there is no  dyspnea, however, she has had some pain in the right rib area and right  upper quadrant.  It lasts for 5 minutes.  It is not exertional nor is it  pleuritic or positional.  It is not related to food.  She has also had  problems with pain in her lower extremities bilaterally.  This is  present all the time and predominantly in the calves.  It does not  change with exertion.  She did have ABIs ordered by her primary care  physician.  This showed a left ABI of 0.80 and a right ABI of 1.01.  It  was also thought that this may be related to her Lipitor.  However, this  medication was discontinued x6 weeks and there was no change.  Also,  note she did have a CK checked that was normal.  She also has problems  feeling lightheaded at times when she ambulates.  There has been no  frank syncope.   CURRENT MEDICATIONS:  1. Aspirin 325 mg p.o. daily.  2. Nitroglycerin SR 2.5 mg p.o. b.i.d.  3. Advair.  4. Singulair.  5. Prevacid.  6. Citracal.  7. Ocuvite.  8. Detrol LA.  9. Lipitor 80 mg p.o. daily.  10.Levothyroxine 25 mcg  p.o. daily.  11.Sertraline 25 mg p.o. daily.  12.Norvasc 5 mg p.o. daily.  13.Coenzyme Q.  14.Multivitamin.   PHYSICAL EXAMINATION:  VITAL SIGNS:  Blood pressure 155/74, pulse 78.  HEENT:  Normal.  NECK:  Supple.  CHEST:  Clear.  CARDIAC:  Regular rate and rhythm.  ABDOMEN:  No tenderness.  EXTREMITIES:  She has 2+ femoral, popliteal and dorsalis pedis pulses  bilaterally.  I cannot palpate cords.  There is no edema.   Note, we did review laboratories that she had drawn recently.  Her TSH  was normal in July.  In August, she had a BUN and creatinine of 44 and  1.4 with a potassium of 4.7.  Her Alk phos is minimally elevated at 139.  Note, when her BUN and creatinine were up, she had been taking Lasix.  She also had an elevated D-dimer, but apparently she had  a CT that was  negative, although I do not have those records available.  A CK  performed on February 16, 2007, was 68 with a sedimentation rate of 19.  Her electrocardiogram shows sinus rhythm at a rate of 73.  The axis is  normal.  There are no ST changes noted.   IMPRESSION/PLAN:  1. Leg pain.  The etiology of this is not clear to me.  I do not think      this is claudication/peripheral vascular disease based on the      description of her symptoms.  Also, I note that she has 2+ femoral,      popliteal and dorsalis pedis pulses.  I also note that her ankle      brachial indexes are only minimally decreased on the left and the      right is normal.  She did have a peripheral arteriogram in October      2007, looking at her renal arteries and her iliacs were normal at      that time.  This also does not appear to be statin related as her      symptoms did not improve off of Lipitor and her previous CK was      normal.  She will follow up with Dr. Elease Hashimoto concerning this issue.  2. Right rib/upper quadrant pain.  We will check dedicated rib x-rays      as well as a right upper quadrant ultrasound, particularly in light      of  her minimally elevated Alk phos.  3. Coronary artery disease, status post coronary artery bypass graft.      She has had no chest pain and her previous Myoview showed no      ischemia or infarction.  We will continue with her aspirin, statin      and nitroglycerin.  4. Cerebrovascular disease.  She will need a followup of carotid      Doppler in September 2009.  5. Hypertension.  Her blood pressure is minimally elevated today.  I      will not increase her Norvasc as she is feeling lightheaded at      times when she ambulates.  She has not had frank syncope and the      etiology of this is not clear.  6. Hyperlipidemia.  We will check lipids and liver and adjust as      indicated.  I will also check a BMET.  7. History of fatigue and bradycardia with beta-blockade.  8. History of hyperkalemia.  9. I will see her back in 6 months.     Madolyn Frieze Jens Som, MD, [facility]  Electronically Signed    BSC/MedQ  DD: 04/22/2007  DT: 04/23/2007  Job #: [id number]   cc:   Lorie Phenix

## 2010-11-01 NOTE — Assessment & Plan Note (Signed)
[facility]                            CARDIOLOGY OFFICE NOTE   NAME:WHITCOMBHolleigh, Crihfield                    MRN:          [id number]  DATE:10/29/2006                            DOB:          June 12, 1924    Ms. Hinesley is a pleasant female who is status post coronary artery  bypass graft.  Her most recent Myoview was in September 2007.  It showed  an ejection fraction of 81% and no scar or ischemia.  Since I last saw  her, she is doing reasonably well.  She has mild dyspnea on exertion but  not with routine activities.  There is no orthopnea, PND or pedal edema.  She occasionally feels chest tightness for several minutes which  resolves the pain spontaneously.  This is not exertional, and it is  unchanged.  She has had this for years.  She also has had some problems  with a pounding sensation in her ears.  She apparently has seen ENT as  well as neurology.  They wonder whether it may be related to the higher  dose of aspirin.   MEDICATIONS:  1. Aspirin 325 mg p.o. daily.  2. Nitroglycerin SR 2.5 mg p.o. b.i.d.  3. Advair.  4. Singulair.  5. Prevacid.  6. Citracal.  7. Ocuvite.  8. Detrol LA 40 mg p.o. daily.  9. Lipitor 80 mg p.o. daily.  10.Levothyroxine 25 mcg p.o. daily.  11.Sertraline 25 mg p.o. daily.  12.She is also taking Norvasc 5 mg p.o. daily.   PHYSICAL EXAMINATION:  VITAL SIGNS:  Blood pressure 120/76, pulse 70.  NECK:  Supple.  CHEST:  Clear.  CARDIOVASCULAR:  Reveals regular rhythm.  EXTREMITIES:  Show no edema.   Her electrocardiogram shows a sinus rhythm at a rate of 70, axis normal.  There are no significant ST changes.   DIAGNOSES:  1. Atypical chest pain - Ms. Sappington's electrocardiogram is      unchanged, and she has had these symptoms intermittently for years.      Her most recent Myoview in September showed no ischemia or infarct.      We will continue with medical therapy.  2. Coronary artery disease status post  coronary bypass and graft - we      will continue with medical therapy including her aspirin.  I will      reduce the dose to 81 mg p.o. daily (the neurologists have wondered      whether the pounding in her ears was related to higher dose of      aspirin; I think this is unlikely, but we will try to decrease to      see if it helps).  We will also continue with her Lipitor and her      nitroglycerin SR.  3. Cerebrovascular disease with history of carotid endarterectomy.      She will need followup carotid Dopplers in September 2009.  4. Hypertension - her blood pressure has been mildly elevated.  She      states it runs in the 130 to 150 systolic range.  We will increase  her Norvasc to 10 mg p.o. daily to see if she tolerates.  5. Hyperlipidemia - we will check lipids and liver and adjust her      regimen as indicated.  6. History of fatigue and bradycardia with beta blockade therapy.  7. History of hyperkalemia now improved.   PLAN:  We will see her back in six months.     Madolyn Frieze Jens Som, MD, [facility]  Electronically Signed    BSC/MedQ  DD: 10/29/2006  DT: 10/29/2006  Job #: [id number]

## 2010-11-01 NOTE — Assessment & Plan Note (Signed)
[facility] OFFICE NOTE   NAME:WHITCOMBBraeley, Buskey                    MRN:          [id number]  DATE:11/08/2007                            DOB:          [birth year]    Ms. Mcburney is a pleasant 77 year old female who has a history of  coronary artery disease, status post coronary bypassing graft.  Her most  recent Myoview was performed in September 2007.  There was no ischemia  or scar and  her ejection fraction was 81%.  She also has a history of  cerebrovascular disease.  She also has a history of renal insufficiency.  When I last saw her on April 22, 2007, she was having some leg pain.  However, this apparently has improved.  We did note at that time that  she had had recent ABIs.  They were only minimally decreased.  She has  also had a previous peripheral arteriogram in October 2007 that showed  no significant obstruction of the renal arteries and her iliacs were  normal at that time.  She was also complaining of right rib upper  quadrant pain.  We did perform an ultrasound of her abdomen.  She was  status post cholecystectomy and the left kidney was smaller than the  right, which was noted on previous scans.  Her rib films showed no  definite fracture.  Since I last saw her she is doing well from a  symptomatic standpoint.  She does have some dyspnea on exertion but she  discontinued her Advair and attributes her dyspnea to this.  There is no  orthopnea, PND or increased pedal edema.  She has not had exertional  chest pain, presyncope or syncope.  She has had some problems with back  pain and problems with dentition.   MEDICATIONS:  1. Aspirin 325 mg p.o. daily.  2. Nitroglycerin SR 2.5 mg p.o. b.i.d.  3. Singulair.  4. Prevacid.  5. Citracal.  6. Ocuvite.  7. Detrol.  8. Levothyroxine 25 mcg p.o. daily.  9. Sertraline.  10.Norvasc 10 mg p.o. daily.  11.Coenzyme Q.  12.Multivitamin.  13.Lipitor 80  mg p.o. daily.   PHYSICAL EXAM:  Blood pressure of 132/60 and her pulse is 61.  She  weighs 185 pounds.  HEENT:  Normal.  NECK:  Supple.  CHEST:  Clear.  CARDIOVASCULAR:  A regular rate and rhythm.  ABDOMEN:  No tenderness.  EXTREMITIES:  No edema.   Her electrocardiogram shows a sinus rhythm at a rate of 61.  There are  no significant ST changes noted.   DIAGNOSES:  1. Coronary artery disease, status post coronary bypassing grafts.      Mrs. Schnoebelen is doing well from a symptomatic standpoint.  She      will continue on her aspirin as well as her statin.  She will      continue with exercise and diet.  2. Cerebrovascular disease.  She needs follow-up carotid Dopplers, and      we will arrange this.  3. Hypertension.  Her blood pressure is adequately controlled on her  present medications.  Note, she has had some problems with      hyperkalemia in the past and we will check a BMET to follow      potassium and renal insufficiency.  4. Hyperlipidemia.  She will continue on her statin and we will check      lipids and liver and adjust with a goal LDL of less than 70.  5. History of fatigue and bradycardia with beta blockade.  6. History of hyperkalemia.   She will see [place] back in 6 months in [place].     Madolyn Frieze Jens Som, MD, [facility]  Electronically Signed    BSC/MedQ  DD: 11/08/2007  DT: 11/08/2007  Job #: [id number]   cc:   Lorie Phenix

## 2010-11-01 NOTE — Assessment & Plan Note (Signed)
[facility] OFFICE NOTE   NAME:Chelsea [name], Chelsea [name]                    MRN:          [id number]  DATE:10/26/2008                            DOB:          [birth year]    REASON FOR VISIT:  Followup CAD.   HISTORY OF PRESENT ILLNESS:  Ms. Boullion is an 77 year old woman with  coronary artery disease, status post remote coronary bypass surgery in  1990.  She had a carotid endarterectomy just before her bypass.  She has  really been very stable from a cardiac standpoint.  She reports  undergoing a cardiac catheterization in [place] prior to her moved down  to [place].  This was a few years back.  She was told that her  grafts were all patent.   The patient has had pneumonia and has been treated as an outpatient.  She completed a course of Augmentin.  She has been taking Mucinex.  She  continues to have a productive cough with approximately 1 month of  symptoms.  She denies fevers or chills.  She complains of generalized  weakness.   She has taken 1 nitroglycerin since her last visit here.  This was a few  months back.  She has otherwise had no chest pain or anginal symptoms.  She denies edema, orthopnea, PND, lightheadedness, palpitations, or  syncope.   CURRENT MEDICATIONS:  1. Amlodipine 10 mg daily.  2. Prevacid 30 mg daily.  3. Nitroglycerin SR 1 p.o. b.i.d.  4. Detrol LA 4 mg daily.  5. Levothyroxine 25 mcg daily.  6. Sertraline 50 mg daily.  7. Aspirin 325 mg daily.  8. Citracal twice daily.  9. Ocuvite twice daily.  10.Multivitamin 1 daily.  11.Coenzyme Q10 100 mg daily.  12.Vitamin D 1 g daily.  13.Lipitor 40 mg daily.  14.Fish oil 120 mg 2 daily.  15.Mucinex 1 daily.   ALLERGIES:  NKDA.   PHYSICAL EXAMINATION:  GENERAL:  The patient is alert and oriented  elderly woman no acute distress.  VITAL SIGNS:  Weight is 178 pounds, blood pressure 126/60, heart rate  74, and respiratory rate  16.  HEENT:  Normal.  NECK:  Normal carotid upstrokes without bruits.  JVP is normal.  LUNGS:  Inspiratory rales in the left lower lung field, otherwise clear.  HEART:  Regular rate and rhythm.  No murmurs or gallops.  ABDOMEN:  Soft, nontender, no organomegaly.  EXTREMITIES:  No clubbing, cyanosis, or edema.  SKIN:  Warm and dry.   EKG shows normal sinus rhythm, cannot rule out anterior infarct,  otherwise within normal limits with no significant ST or T changes.   ASSESSMENT:  1. Coronary artery disease, status post coronary artery bypass graft.      The patient remains stable.  She is on antiplatelet-therapy with      aspirin.  She is on appropriate secondary risk reduction measures      with atorvastatin.  Her blood pressure is well controlled.  No      changes were made in her medical regimen today.  2. Dyslipidemia.  The patient's Lipitor  dose was reduced from 80 to 40      several months ago.  Followup lipids were still at goal with total      cholesterol of 147, triglycerides of 209, HDL 42, and LDL 63.      Followup lipids and LFTs in February 2011.  I would like to see her      back in the office after those labs are completed.  3. Hypertension.  Blood pressure at goal on current therapy.  No      changes made today.  4. Recent pneumonia.  The patient continues to have an abnormal lung      exam.  I have asked her to follow up with her primary care      physician.     Veverly Fells. Excell Seltzer, MD  Electronically Signed    MDC/MedQ  DD: 10/26/2008  DT: 10/27/2008  Job #: [id number]   cc:   Lorie Phenix

## 2010-11-04 NOTE — Assessment & Plan Note (Signed)
[facility]                              CARDIOLOGY OFFICE NOTE   NAME:Chelsea [name], Chelsea [name]                    MRN:          [id number]  DATE:01/16/2006                            DOB:          08/21/1923    Mrs. Blassingame is a very pleasant 77 year old female who has a history of  coronary disease, status post coronary bypassing graft.  Since I last saw  her, she continues to have dyspnea which is a chronic issue.  There is no  orthopnea, PND, or pedal edema.  She has had 2 episodes of chest pain  requiring nitroglycerin.  She also is complaining of leg cramps that she  thinks may be related to her Lipitor.   MEDICATIONS:  1.  Meclizine.  2.  Nitroglycerin SR 2.5 mg p.o. b.i.d.  3.  Aspirin 325 mg p.o. daily.  4.  Advair.  5.  Singulair.  6.  Prevacid.  7.  Citracal.  8.  Ocuvite.  9.  Detrol.  10. Premarin.  11. Altace 10 mg p.o. daily.  12. Levothyroxine 25 mcg p.o. every day.  13. Lipitor 80 mg p.o. every day.  14. Albuterol b.i.d.   PHYSICAL EXAMINATION:  CHEST:  Clear  CARDIOVASCULAR:  Reveals a regular rate and rhythm.  EXTREMITIES:  Show no edema.   I do have an electrocardiogram from December 27, 2005, that showed a normal  sinus rhythm with no significant ST changes.  There were occasional PACs.  Note, there was some concern about an irregular heart rhythm but she has not  had palpitations.   DIAGNOSES:  1.  Coronary artery disease, status post coronary bypassing graft.  2.  Hypertension.  3.  Hyperlipidemia.  4.  History of carotid endarterectomy.  5.  History of bradycardia and fatigue with beta-blockade.   PLAN:  Mrs. Kent is doing reasonably well from a symptomatic standpoint.  She is complaining of chest pain with 2 brief episodes in the past 2 months.  We will schedule her for an adenosine Myoview for risk stratification.  If  it shows normal perfusion, we will continue with medical therapy.  She has  complained of  pain in her lower extremities that are described as cramping  and she feels it is related to her Lipitor.  We will discontinue that  medication and see if she improves.  If so, we will try Vytorin 10/40 q.h.s.  to see if she tolerates this better with recheck of lipids and liver 6 weeks  afterwards.  She will need followup carotid dopplers.  She will continue  with diet and exercise.  I will see her back in 12 months.  Note, she  apparently was found to have an irregular heart beat  but her  electrocardiogram only shows PACs.  I do not think this needs further  evaluation.                              Madolyn Frieze Jens Som, MD, [facility]    BSC/MedQ  DD:  01/16/2006  DT:  01/16/2006  Job #:  [id number]

## 2010-11-04 NOTE — H&P (Signed)
NAMEMarland Kitchen  Chelsea [name], Chelsea [name] NO.:  [id number]   MEDICAL RECORD NO.:  [id number]          PATIENT TYPE:  INP   LOCATION:  3733                         FACILITY:  [facility]   PHYSICIAN:  Carole Binning, M.D. LHCDATE OF BIRTH:  04-11-1924   DATE OF ADMISSION:  09/05/2004  DATE OF DISCHARGE:                                HISTORY & PHYSICAL   CHIEF COMPLAINT:  Chest pain.   HISTORY OF PRESENT ILLNESS:  Chelsea [name] is a 77 year old female who has  had bypass surgery in 1989 with LIMA to LAD and SVG to circumflex.  She had  a Cardiolite in October 2005 that was equivocal for ischemia.  She was  evaluated by Dr. Jens Som at that time, and patient stated she was offered  catheterization but refused it.   On Friday, Chelsea [name] stated that she received a prescription from her  physician for Astelin nasal spray and had it filled.  She stated she used  the nasal spray as directed.  She stated that ever since then she has had  chest pain.  The chest pain is described as a pressure, and she states it is  like her previous episodes of chest pain, but this did not resolve.  She  states the pain has been continuous for the last two days.  She stated that  finally yesterday morning she took a sublingual nitroglycerin with partial  relief.  She states it reaches a 6 or 7/10.  She has associated shortness of  breath but no nausea or diaphoresis. She has not tried any other medications  for this pain.  She is noted by Dr. Jens Som to have occasional chest  heaviness every since the surgery, and this is worse than usual for her.   PAST MEDICAL HISTORY:  1.  Status post coronary artery bypass surgery in 1989 with LIMA to LAD and      SVG to circumflex.  2.  Preserved left ventricular function with EF of 79% by [facility]      February 2005.  3.  Hypertension.  4.  Hyperlipidemia.  5.  History of bradycardia and fatigue secondary to beta blockade.  6.  Peripheral vascular disease.  7.   History of atypical chest pain.  8.  History of COPD, asthma.  9.  History of cataracts.   PAST SURGICAL HISTORY:  1.  Aortocoronary bypass surgery.  2.  Carotid endarterectomy.  3.  Hysterectomy.   ALLERGIES:  No known drug allergies.   MEDICATIONS:  1.  Meclizine 12.5 mg b.i.d.  2.  Nitroglycerin 2.5 mg b.i.d.  3.  Aspirin 325 mg daily.  4.  Advair 250/50 b.i.d.  5.  Singulair 2 mg daily.  6.  Flonase 1 spray in each nostril daily.  7.  Prevacid 30 mg daily.  8.  Citracal 800 mg daily.  9.  Ocu-Vite/PreserVision four times a day  10. Detrol LA 4 mg daily.  11. Premarin 0.625 mg daily.  12. Altace 10 mg daily.  13. Folic acid.  14. Lipitor 40 mg daily.  15. Glucolax daily.  16. Astelin is on hold.  17. Sublingual nitroglycerin.  18. Valium 5 mg p.r.n.   SOCIAL HISTORY:  She lives alone in [place].  She is retired.  She does  not abuse alcohol or drugs.   FAMILY HISTORY:  Her parents are deceased.  They had a history of CVA but  not coronary artery disease.   REVIEW OF SYSTEMS:  Significant for chronic dyspnea on exertion.  She has  had some abdominal pain.  She had an episode recently where she had weakness  and abnormal level of consciousness.  She was wondering if this might be a  TIA but did not seek medical attention at the time.  Review of Systems is  otherwise negative.   PHYSICAL EXAMINATION:  VITAL SIGNS:  She is afebrile.  Her weight is 180  pounds which is 3 pounds more than it was November 2005.  Blood pressure  142/86, heart rate 100.  GENERAL:  Well-developed, elderly, white female in no acute distress.  HEENT:  Head is normocephalic and atraumatic.  Pupils equal, round, and  reactive to light and accommodation.  Extraocular movements are intact.  NECK:  There is no JVD or thyromegaly.  She may have a soft carotid bruit on  the left.  CHEST:  Clear to auscultation bilaterally.  CARDIOVASCULAR:  Heart is regular in rate and rhythm with no  significant  murmur, rub, or gallop noted.  ABDOMEN:  Soft with active bowel sounds.  She has some right upper quadrant  tenderness but no hepatosplenomegaly by palpation.  EXTREMITIES:  No edema.  Distal pulses are 2+ in all four extremities.  No  femoral bruits are appreciated.  MUSCULOSKELETAL:  No deformity or effusions noted.  NEUROLOGIC:  She is alert and oriented.  Cranial nerves II-XII grossly  intact.  SKIN:  No rashes or lesions are noted.   LABORATORY DATA AND OTHER STUDIES:  EKG:  Sinus tachycardia with PVCs.   ASSESSMENT AND PLAN:  1.  Chest pain:  With ongoing chest pain, admission to the hospital is      indicated.  She will be started on IV nitroglycerin. We will obtain a      head CT since recently concerning for transient ischemic attack.  If the      head CT is negative, will add heparin to her medication regimen as well.      She will be continued on her other medications.  She has been put on the      catheterization schedule and pre-catheterization orders have been      written.  Her blood pressure will be reassessed once she is pain free.      If it continues elevated, possibly can add Cardizem or Norvasc to her      medications.  She will be continued on her other medications.   This is Theodore Demark, P.A.-C. dictating for [name], M.D. who saw  the patient and determined the plan of care.      RB/MEDQ  D:  09/05/2004  T:  09/05/2004  Job:  [id number]

## 2010-11-04 NOTE — Op Note (Signed)
[name], [name] NO.:  [id number]   MEDICAL RECORD NO.:  [id number]          PATIENT TYPE:  AMB   LOCATION:  SDS                          FACILITY:  [facility]   PHYSICIAN:  Salvadore Farber, MD  DATE OF BIRTH:  01/15/1924   DATE OF PROCEDURE:  04/11/2006  DATE OF DISCHARGE:                                 OPERATIVE REPORT   PROCEDURE:  Selective bilateral renal angiography, abdominal aortography,  Starclose closure of the right common femoral arteriotomy site.   INDICATIONS:  Chelsea [name] is an 77 year old lady with long standing  atherosclerosis.  She recently had a non contrast CT scan of the abdomen  suggesting atrophy of the left kidney.  She then developed severe  hyperkalemia while on an ACE inhibitor.  This raised concern for bilateral  renal artery stenosis.  She has a creatinine of 1.2.  She has not had any  heart failure.  Due to a high pretest probability of renal artery stenosis,  she was referred for renal angiography.   PROCEDURE TECHNIQUE:  Informed consent was obtained.  Under 1% lidocaine  local anesthesia, a 5-French sheath was placed in the right common femoral  artery using the modified Seldinger technique.  A pigtail catheter was  advanced to the suprarenal abdominal aorta.  Abdominal aortography was  performed by power injection.  This demonstrated two right renal arteries  and a single left renal artery.  A LIMA catheter was then used to  selectively engage each renal artery in turn.  All renal arteries are  normal.  The parenchyma of the left kidney appears normal with a normal  sized kidney.   The arteriotomy was then closed using a Starclose device.  Complete  hemostasis was obtained.  She was then transferred to the holding room in  stable condition.   COMPLICATIONS:  None.   FINDINGS:  1. Abdominal aorta:  Normal infrarenal abdominal aorta without plaque or      aneurysm.  2. Iliac arteries:  The proximal portion of the common  iliacs were normal      bilaterally.  3. Renal arteries:  There is a single left renal artery.  It is      angiographically normal.  The left kidney is normal in size with a      normal appearing distal vasculature.  There are two right renal      arteries.  The inferior is the dominant.  Both are angiographically      normal.   IMPRESSION/PLAN:  The patient has normal renal arteries.  Her hyperkalemia,  therefore, raises the concern for primary hyperaldosteronism.  I discussed  this with Dr. Jens Som who will evaluate her at an upcoming office visit.      Salvadore Farber, MD  Electronically Signed     WED/MEDQ  D:  04/11/2006  T:  04/12/2006  Job:  [id number]   cc:   Baxter Kail Jens Som, MD, [facility]

## 2010-11-04 NOTE — Cardiovascular Report (Signed)
NAMEMarland Kitchen  Chelsea [name], Chelsea [name] NO.:  [id number]   MEDICAL RECORD NO.:  [id number]          PATIENT TYPE:  INP   LOCATION:  3733                         FACILITY:  [facility]   PHYSICIAN:  Carole Binning, M.D. LHCDATE OF BIRTH:  11/18/1923   DATE OF PROCEDURE:  09/06/2004  DATE OF DISCHARGE:                              CARDIAC CATHETERIZATION   PROCEDURE PERFORMED:  Left heart catheterization with coronary angiography,  bypass graft angiography and left ventriculography.   INDICATIONS:  The patient is a 77 year old woman with history of previous  coronary artery bypass surgery. She presented to the office yesterday with  three days of persistent substernal chest pain. She was admitted to the  hospital where she ruled out for myocardial infarction. She was then  referred for cardiac catheterization.   PROCEDURE NOTE:  A 6-French sheath was placed in the right femoral artery.  Native coronary angiography was performed using standard Judkins 6-French  catheters. The saphenous vein graft to obtuse marginal was imaged with a JR-  4 catheter. The internal mammary graft was imaged with internal mammary  catheter. Left ventriculography was performed with an angled pigtail  catheter. Contrast was Omnipaque. There were no complications.   RESULTS:  HEMODYNAMICS:  Left ventricular pressure 140/10, aortic pressure was initially 188/75 but  then decreased with intravenous nitroglycerin. There was no aortic valve  gradient. Wall motion is normal, ejection fraction estimated 65%. There was  mild mitral regurgitation which appeared to be secondary to ventricular  ectopy.   Coronary arteriography:  Left main is normal.  Left anterior descending artery has an 80% stenosis in the ostium and 80%  stenosis in the mid vessel. The distal LAD fills via left internal mammary  graft.  Left circumflex gives rise to large bifurcating ramus intermedius and normal-  sized obtuse marginal branch.  The ramus intermedius is free of significant  disease. The obtuse marginal branch is 100% occluded at its origin. However,  it fills via saphenous vein graft.  Right coronary is a dominant vessel. There is a 30% stenosis in the proximal  vessel, followed by 60-70% stenosis in the mid vessel, followed by 40%  stenosis in the mid vessel. The distal right coronary gives rise to large  acute marginal branch, a normal size posterior descending artery and a small  posterior branch.  Left internal mammary artery to the distal LAD is patent throughout its  course filling the mid and distal LAD with competitive flow from the native  LAD.  Saphenous vein graft to obtuse marginal was patent with mild ectasia in the  mid graft but no stenotic disease. This fills a normal-sized obtuse marginal  branch.   IMPRESSION:  1.  Normal left ventricular systolic function.  2.  Native three-vessel coronary artery disease.  3.  Status post coronary bypass surgery. The patient has patent grafts to      the left anterior descending artery and obtuse marginal branch. The      right coronary is not grafted and there is moderate disease in the      native right coronary which is of borderline  severity.   RECOMMENDATIONS:  For medical therapy. If the patient has recurrent, more  typical anginal symptoms, would recommend a stress nuclear study to rule out  ischemia in the right coronary artery distribution. If there is ischemia  found in this territory, the right coronary could be treated percutaneously.      MWP/MEDQ  D:  09/06/2004  T:  09/06/2004  Job:  [id number]   cc:   Lorie Phenix  [address]  [place]  [place] [address]  Fax: [phone]

## 2010-11-04 NOTE — Assessment & Plan Note (Signed)
[facility]                              CARDIOLOGY OFFICE NOTE   NAME:WHITCOMBMoria, Brophy                    MRN:          [id number]  DATE:05/02/2006                            DOB:          [birth year]    Chelsea [name] has a history of coronary artery disease and is status post  coronary bypassing graft.  I last saw her in July and she has had occasional  episodes of chest pain which is not uncommon for her.  We did schedule her  to have a Myoview which was performed on February 26, 2006.  Her ejection  fraction was noted to be 81% and there was no ischemia or infarction.  Note,  her most recent cardiac catheterization performed in March of 2006 also  showed patent grafts.  She recently apparently had an episode of  hyperkalemia and was seen by Dr. Samule Ohm after CT performed at [facility] suggested an atypical left kidney.  There was concern that there  may be renal artery stenosis.  She underwent a renal arteriogram on April 11, 2006.  There was no significant renal artery stenosis noted.  Since that  time she has had occasional right chest pain that lasts for several minutes  and resolves.  She has not taken nitroglycerine.  Note, again this is not  common and she typically does not have exertional chest pain.  She has mild  dyspnea on exertion which is a chronic issue.  Her medications include  nitroglycerine SR 2.5 mg p.o. b.i.d., aspirin, Advair, Singulair, Prevacid,  Citracal, OsteoVite, Detrol, Premarin, Altace 10 mg p.o. daily, GlycoLax,  Sertraline and Lipitor 80 mg p.o. daily.   PHYSICAL EXAMINATION:  Her physical examination today shows a blood pressure  of 143/83 and her pulse is 59.  NECK:  Supple.  CHEST:  Clear.  CARDIAC:  Regular rate and rhythm.  EXTREMITIES:  Show no edema.   Her electrocardiogram shows a sinus rhythm with occasional PACs.  There are  no ST changes noted.   DIAGNOSES:  1. Coronary artery disease  status post coronary bypassing graft.  2. Cardiovascular disease with history of carotid endarterectomy.  3. Hypertension.  4. Hyperlipidemia.  5. Recent hyperkalemia.  6. History of bradycardia and fatigue with beta blocker therapy   PLAN:  [name] is doing well from a symptomatic standpoint.  She has  had brief episodes of chest pain which not unusual for her, but she does not  have exertional chest pain and her recent Myoview showed normal perfusion.  We will therefore continue with medical therapy.  Etiology of her recent  hyperkalemia is unclear.  Her renal arteriogram did not show renal artery  stenosis.  She is scheduled to have BMET tomorrow by Dr. Elease Hashimoto and if she  continues to have hyperkalemia then her Altace would need to be  discontinued.  She had had problems with myalgias in the past on Lipitor but  apparently is tolerating 80 mg a day at present.  We will therefore continue with this medication.  She will see [place] back in  approximately 6  months.  She will need followup carotid Doppler in  approximately 2 years.     Chelsea [name] Jens Som, MD, [facility]  Electronically Signed    BSC/MedQ  DD: 05/02/2006  DT: 05/02/2006  Job #: [id number]

## 2010-11-04 NOTE — Discharge Summary (Signed)
NAMESHERMAINE, RIVET NO.:  [id number]   MEDICAL RECORD NO.:  [id number]          PATIENT TYPE:  INP   LOCATION:  3733                         FACILITY:  [facility]   PHYSICIAN:  Olga Millers, M.D. LHCDATE OF BIRTH:  12-Jun-1924   DATE OF ADMISSION:  09/05/2004  DATE OF DISCHARGE:  09/07/2004                                 DISCHARGE SUMMARY   PROCEDURES:  1.  Cardiac catheterization.  2.  Coronary arteriogram.  3.  Left ventriculogram.  4.  Angiogram.  5.  Left internal mammary artery arteriogram.   HOSPITAL COURSE:  Ms. Fort is a 77 year old female with known coronary  artery disease.  She came to the office on September 05, 2004 complaining of a  two day history of chest pain.  She was admitted for further evaluation and  treatment.   Her cardiac enzymes were negative and she had a catheterization on September 06, 2004.  It shows an 80% LAD and an OM that was total proximally.  The LIMA to  LAD filled the distal LAD and the SVG to OM was patent as well.  The RCA had  60-70% stenosis and her EF was normal.  Dr. Gerri Spore evaluated the films  and felt that the RCA lesion was borderline in severity and medical therapy  was the best option.  Postprocedure she was pain-free and her groin was  stable.  She is tentatively considered stable for discharge on September 07, 2004.   DISCHARGE DIAGNOSES:  1.  Status post aortocoronary bypass surgery in 1989 with left internal      mammary artery to left anterior descending artery and saphenous vein      graft to circumflex.  2.  Preserved left ventricular function by catheterization this admission.  3.  Hypertension.  4.  Hyperlipidemia.  5.  History of bradycardia/fatigue secondary to beta-blockers.  6.  History of peripheral vascular disease.  7.  History of atypical chest pain.  8.  History of chronic obstructive pulmonary disease and asthma.  9.  History of cataracts.  10. Status post carotid endarterectomy.  11.  Status post hysterectomy.   DISCHARGE INSTRUCTIONS:  Her activity level is to include no strenuous  activity and no driving for two days.  She is to stick to a low fat diet.  She is to call the office for problems with the cath site.  She has a  followup appointment with Dr. Jens Som on September 22, 2004 at 11:00 a.m. and  she is to follow up with Dr. Elease Hashimoto as scheduled.   DISCHARGE MEDICATIONS:  1.  Coated aspirin 325 mg daily.  2.  Meclizine 12.5 mg b.i.d.  3.  Nitroglycerin 2.5 mg b.i.d.  4.  Advair 250/50 b.i.d.  5.  Singulair 2 mg daily.  6.  Flonase q. nares daily.  7.  Prevacid 30 mg daily.  8.  Citracal 800 mg daily.  9.  Ocuvite PreserVision vitamins four times a day.  10. Detrol LA 4 mg daily.  11. Premarin 0.625 daily.  12. Altace 10 mg daily.  13. Folic acid daily.  14. Lipitor  40 mg daily.  15. __________ daily.  16. Astelin is on hold.  17. Sublingual nitroglycerin.  18. Valium 5 mg p.r.n.      RB/MEDQ  D:  09/06/2004  T:  09/06/2004  Job:  [id number]   cc:   Lorie Phenix  [address]  [place]  [place] [address]  Fax: [phone]   Olga Millers, M.D. [facility]

## 2010-11-04 NOTE — Progress Notes (Signed)
[facility]                          PERIPHERAL VASCULAR OFFICE NOTE   NAME:Chelsea [name]                    MRN:          [id number]  DATE:04/03/2006                            DOB:          [birth year]    REASON FOR CONSULTATION:  The patient referred by Dr. Elease Hashimoto for  consultation regarding possible renal artery stenosis.   HISTORY OF PRESENT ILLNESS:  Chelsea [name] is an 77 year old lady with a  longstanding history of controlled hypertension.  CT of the abdomen and  pelvis performed January 29, 2006, at [facility] demonstrated an  atrophic left kidney and unremarkable right kidney.  She has been treated  with Altace for a number of years.  Last week she was noted to have  hyperkalemia with a potassium level of 6.4.  Altace was discontinued and  potassium declined in the subsequent 2 days.  She has not been on exogenous  potassium.  She has not had heart failure.   PAST MEDICAL HISTORY:  1. Atherosclerotic coronary disease status post coronary artery bypass      grafting in 1989.  2. Status post left carotid endarterectomy performed in [place].  3. Status post cholecystectomy.  4. Status post hysterectomy and bladder suspension.  5. Irritable bowel syndrome.  6. Hypertension.  7. Hypercholesterolemia.  8. Status post left brain stroke in the 1980s, etiology not recalled by      the patient. No residual symptoms.  9. Mitral valve prolapse.  10.Osteoporosis.  11.Asthma.   ALLERGIES:  NKDA.   CURRENT MEDICATIONS:  1. Lipitor 80 mg per week.  2. Premarin 0.065 mg per day.  3. Prevacid 30 mg per day.  4. Detrol LA 4 mg per day.  5. Advair 250/50 one per day.  6. Singulair p.r.n.  7. Nitroglycerin p.r.n.  8. Aspirin 325 mg per day.  9. Valium 5 mg p.r.n.  10.Zoloft 25 mg per day.  11.Ocuvite.  12.Levothyroxine 25 mcg per day.  13.She stopped Altace last week.   SOCIAL HISTORY:  The patient is a retired Catering manager.  She  is widowed with a  daughter who lives in [place].  Gets no exercise.  Enjoys reading.  She  never smoked.  Denies alcohol and illicit drug use.   FAMILY HISTORY:  Father died at 74 of stroke.  Mother died at 41 of  throat  cancer.  Three siblings are alive all with hypertension.  Her daughter is  alive and well at 15.   REVIEW OF SYSTEMS:  Remarkable for wearing glasses, partial upper and lower  dentures, occasional exertional dyspnea which she attributes to her asthma.  Review of systems is otherwise negative in detail except as above.   PHYSICAL EXAMINATION:  She is generally well-appearing in no distress.  She  appears younger than her stated age.  Heart rate 77.  Blood pressure 128/82 on the right and 122/80 on the left.  She is 5 feet 8 inches tall and weighs 181 pounds.  SKIN:  Normal.  HEENT:  Normal.  MUSCULOSKELETAL:  Normal.  She has no jugular venous distention, thyromegaly, or lymphadenopathy.  Respiratory effort  is normal.  Lungs are clear to auscultation.  She has a nondisplaced point of maximal cardiac impulse.  There is a regular  rate and rhythm without murmur, rub, or gallop.  ABDOMEN:  Soft, nondistended, nontender.  There is no hepatosplenomegaly.  There is no midline pulsatile mass and no abdominal bruit.  EXTREMITIES:  Warm without clubbing, cyanosis, edema, or ulceration.  Carotid pulses bilaterally without bruit.  Femoral pulses 2+ bilaterally  without bruit.  DP and PT pulses 2+ bilaterally.  Popliteal pulses 2+ with  normal size.  She is alert and oriented x3 with normal affect and normal neurologic exam.   IMPRESSION/RECOMMENDATION:  An 77 year old lady with longstanding  atherosclerotic disease.  She has CT evidence of an atrophic left kidney.  The recent development of hyperkalemia is suggestive of development of renal  artery stenosis in the right kidney as well.  Because of the pretest  probability of bilateral renal artery stenosis, I have suggested  proceeding  directly to invasive angiography.  If found, would plan on revascularization  to allow treatment with angiotensin-converting enzyme inhibitor and perhaps  to prevent further deterioration in  renal function.  We will check pre-procedural laboratories today and  schedule the procedure for shortly.       Chelsea Farber, MD      WED/MedQ  DD:  04/04/2006  DT:  04/05/2006  Job #:  [id number]

## 2010-12-07 ENCOUNTER — Encounter: Payer: Self-pay | Admitting: Cardiovascular Disease

## 2011-01-20 ENCOUNTER — Encounter: Payer: Self-pay | Admitting: Cardiovascular Disease

## 2011-01-20 ENCOUNTER — Ambulatory Visit (INDEPENDENT_AMBULATORY_CARE_PROVIDER_SITE_OTHER): Payer: Medicare Other | Admitting: Cardiovascular Disease

## 2011-01-20 DIAGNOSIS — I2510 Atherosclerotic heart disease of native coronary artery without angina pectoris: Secondary | ICD-10-CM

## 2011-01-20 DIAGNOSIS — I25810 Atherosclerosis of coronary artery bypass graft(s) without angina pectoris: Secondary | ICD-10-CM

## 2011-01-20 DIAGNOSIS — E785 Hyperlipidemia, unspecified: Secondary | ICD-10-CM

## 2011-01-20 DIAGNOSIS — R42 Dizziness and giddiness: Secondary | ICD-10-CM

## 2011-01-20 DIAGNOSIS — I6529 Occlusion and stenosis of unspecified carotid artery: Secondary | ICD-10-CM

## 2011-01-20 MED ORDER — ATORVASTATIN CALCIUM 80 MG PO TABS: 80.0000 mg | ORAL_TABLET | Freq: Every day | ORAL | Status: DC

## 2011-01-20 NOTE — Assessment & Plan Note (Signed)
Currently with no symptoms of angina. No further workup at this time. Continue current medication regimen. 

## 2011-01-20 NOTE — Patient Instructions (Addendum)
You are doing well. Please hold the amlodipine.  Take benicar as needed for high blood pressure Decrease the aspirin to one a day Please call us if you have new issues that need to be addressed before your next appt.  We will call you for a follow up Appt. In 6 months

## 2011-01-20 NOTE — Assessment & Plan Note (Signed)
Cholesterol is at goal on the current lipid regimen. No changes to the medications were made.  

## 2011-01-20 NOTE — Assessment & Plan Note (Signed)
Etiology of the dizziness is uncertain. We have suggested we hold the amlodipine. We have given her samples of Benicar that she can take for hypertension over the next week. Uncertain if amlodipine has been causing her symptoms.

## 2011-01-20 NOTE — Assessment & Plan Note (Signed)
Heavy plaque with minimal stenosis in the right bulb in 2011. Will repeat carotid ultrasound next year in 2013.

## 2011-01-20 NOTE — Progress Notes (Signed)
Patient ID: Chelsea [name], female    DOB: Nov 27, 1923, 77 y.o.   MRN: [id number]  HPI Comments: 76 year-old woman with CAD s/p CABG, diastolic dysfunction, carotid endarterectomy on the left 10 years ago, mildly dilated left atrium, mild MR, presenting today for follow-up evaluation. she had dizziness in the past and her amlodipine was decreased from 10 mg to 5 mg.    She Continues to have some dizziness . It happens when she stands and last for several minutes. Blood pressure measurements are ranging in the 140-160 range on average. She has stopped nitroglycerin patch with no improvement. No improvement with decreasing amlodipine to 5 mg daily. Previous flank and abdominal pain has resolved   Previous echo in 05/2009 showed normal LV function without valvular abnormalities   stress test was negative for ischemia.     carotid ultrasound last year showed mild bilateral disease   Cholesterol is well controlled with LDL less than 70   EKG shows normal sinus rhythm with a rate of 62 beats per minute, no significant ST or T wave changes     Outpatient Encounter Prescriptions as of 01/20/2011  Medication Sig Dispense Refill  . amLODipine (NORVASC) 5 MG tablet Take 5 mg by mouth daily.        Marland Kitchen aspirin 81 MG tablet Take two tablets daily.       Marland Kitchen atorvastatin (LIPITOR) 80 MG tablet Take 1 tablet (80 mg total) by mouth daily.  90 tablet  4  . budesonide-formoterol (SYMBICORT) 80-4.5 MCG/ACT inhaler Inhale 2 puffs into the lungs 2 (two) times daily.        . diazepam (VALIUM) 5 MG tablet Take 5 mg by mouth every 6 (six) hours as needed.        . lansoprazole (PREVACID) 30 MG capsule Take 30 mg by mouth daily.        Marland Kitchen levothyroxine (SYNTHROID, LEVOTHROID) 75 MCG tablet Take 75 mcg by mouth daily.        . Multiple Minerals-Vitamins (CALCIUM CITRATE +) TABS Take 1 tablet by mouth daily.        . Multiple Vitamins-Minerals (OCUVITE PRESERVISION PO) Take 1 capsule by mouth daily.        .  nitroGLYCERIN (NITRODUR - DOSED IN MG/24 HR) 0.4 mg/hr Place 1 patch onto the skin daily as needed.        . nitroGLYCERIN (NITROSTAT) 0.4 MG SL tablet Place 0.4 mg under the tongue every 5 (five) minutes as needed.        . sertraline (ZOLOFT) 50 MG tablet Take 50 mg by mouth daily.        Marland Kitchen tiotropium (SPIRIVA) 18 MCG inhalation capsule Place 18 mcg into inhaler and inhale daily.        Marland Kitchen tolterodine (DETROL LA) 4 MG 24 hr capsule Take 4 mg by mouth daily.          Review of Systems  Constitutional: Negative.   HENT: Negative.   Eyes: Negative.   Respiratory: Negative.   Cardiovascular: Negative.   Gastrointestinal: Negative.   Musculoskeletal: Negative.   Skin: Negative.   Neurological: Positive for dizziness.  Hematological: Negative.   Psychiatric/Behavioral: Negative.   All other systems reviewed and are negative.    BP 139/75  Pulse 62  Ht 5\' 7"  (1.702 m)  Wt 169 lb (76.658 kg)  BMI 26.47 kg/m2   Physical Exam  Nursing note and vitals reviewed. Constitutional: She is oriented to person, place, and time.  She appears well-developed and well-nourished.  HENT:  Head: Normocephalic.  Nose: Nose normal.  Mouth/Throat: Oropharynx is clear and moist.  Eyes: Conjunctivae are normal. Pupils are equal, round, and reactive to light.  Neck: Normal range of motion. Neck supple. No JVD present.  Cardiovascular: Normal rate, regular rhythm, S1 normal, S2 normal, normal heart sounds and intact distal pulses.  Exam reveals no gallop and no friction rub.   No murmur heard. Pulmonary/Chest: Effort normal and breath sounds normal. No respiratory distress. She has no wheezes. She has no rales. She exhibits no tenderness.  Abdominal: Soft. Bowel sounds are normal. She exhibits no distension. There is no tenderness.  Musculoskeletal: Normal range of motion. She exhibits no edema and no tenderness.  Lymphadenopathy:    She has no cervical adenopathy.  Neurological: She is alert and  oriented to person, place, and time. Coordination normal.  Skin: Skin is warm and dry. No rash noted. No erythema.  Psychiatric: She has a normal mood and affect. Her behavior is normal. Judgment and thought content normal.         Assessment and Plan

## 2011-01-26 ENCOUNTER — Telehealth: Payer: Self-pay | Admitting: *Deleted

## 2011-01-26 NOTE — Telephone Encounter (Signed)
She could take a benicar (samples given) if BP is > than 150 over the weekend Try a few more days off the amlodipine to see if this helps with dizziness

## 2011-01-26 NOTE — Telephone Encounter (Signed)
Pt called to give update on her dizziness, she has been holding amlodipine as instructed at last visit. Her BP running SBP140s, she had an episode of dizziness and her SBP was 160. She says dizziness is "some" better but still present. Advised pt if BP continues to be elevated throughout weekend to restart amlodipine if she has not heard back from me by Friday. Dr. Mariah Milling please advise.

## 2011-01-27 NOTE — Telephone Encounter (Signed)
Ok, will let pt know. Attempted to contact pt, LMOM TCB.

## 2011-01-27 NOTE — Telephone Encounter (Signed)
Spoke to pt, she had forgotten about the samples. She will take Benicare for SBP >150, and continue to hold amlodipine. Pt will call me back early next week to follow up with symptoms.

## 2011-02-07 ENCOUNTER — Telehealth: Payer: Self-pay | Admitting: *Deleted

## 2011-02-07 NOTE — Telephone Encounter (Signed)
Pt called to report BP elevated at 160/82, consistent past 2 days. I advised pt to take her Benicar samples as instructed at last ov 01/20/11 for SBP >150. Pt will do this, she just wanted to make sure its ok. She still has dizziness, and not only when she stands up, but "all the time." Pt states her HR in 70s mostly. She has held her amlodipine for 3 weeks now, and has not had any change. Her tremors have also increased along with dizziness, she sees Dr. Sherryll Burger pcp for tremors. I told pt if BP/Hr not low, off amlodipine >3wks, and still dizzy, this may be some other medical issue rather than cardiac causing.  Dr. Mariah Milling, since we had her hold amlodipine to see if symptoms improved, and no changes please advise regarding continued dizziness. Thanks.

## 2011-02-08 NOTE — Telephone Encounter (Signed)
As you said, dizziness probably not from amlodipine. If it is all the time, could be inner ear problem? She is not on any other heart meds. She could restart amlodipine probably as this does not seem to be the cause.

## 2011-02-08 NOTE — Telephone Encounter (Signed)
Spoke to pt, notified of msg below, she is going to restart Amlodipine, hold Benicar and f/u with her PCP regarding dizziness.

## 2011-02-18 HISTORY — PX: HIP SURGERY: SHX245

## 2011-05-04 DIAGNOSIS — J449 Chronic obstructive pulmonary disease, unspecified: Secondary | ICD-10-CM

## 2011-05-04 DIAGNOSIS — I10 Essential (primary) hypertension: Secondary | ICD-10-CM

## 2011-05-04 DIAGNOSIS — S7290XA Unspecified fracture of unspecified femur, initial encounter for closed fracture: Secondary | ICD-10-CM

## 2011-05-04 DIAGNOSIS — I2510 Atherosclerotic heart disease of native coronary artery without angina pectoris: Secondary | ICD-10-CM

## 2011-05-23 DIAGNOSIS — S7290XA Unspecified fracture of unspecified femur, initial encounter for closed fracture: Secondary | ICD-10-CM

## 2011-05-23 DIAGNOSIS — I2510 Atherosclerotic heart disease of native coronary artery without angina pectoris: Secondary | ICD-10-CM

## 2011-05-23 DIAGNOSIS — J449 Chronic obstructive pulmonary disease, unspecified: Secondary | ICD-10-CM

## 2011-05-23 DIAGNOSIS — I10 Essential (primary) hypertension: Secondary | ICD-10-CM

## 2011-06-21 DIAGNOSIS — R269 Unspecified abnormalities of gait and mobility: Secondary | ICD-10-CM | POA: Diagnosis not present

## 2011-06-21 DIAGNOSIS — M6281 Muscle weakness (generalized): Secondary | ICD-10-CM | POA: Diagnosis not present

## 2011-06-23 DIAGNOSIS — M6281 Muscle weakness (generalized): Secondary | ICD-10-CM | POA: Diagnosis not present

## 2011-06-23 DIAGNOSIS — R269 Unspecified abnormalities of gait and mobility: Secondary | ICD-10-CM | POA: Diagnosis not present

## 2011-06-26 DIAGNOSIS — M6281 Muscle weakness (generalized): Secondary | ICD-10-CM | POA: Diagnosis not present

## 2011-06-26 DIAGNOSIS — R269 Unspecified abnormalities of gait and mobility: Secondary | ICD-10-CM | POA: Diagnosis not present

## 2011-06-28 DIAGNOSIS — R269 Unspecified abnormalities of gait and mobility: Secondary | ICD-10-CM | POA: Diagnosis not present

## 2011-06-28 DIAGNOSIS — M6281 Muscle weakness (generalized): Secondary | ICD-10-CM | POA: Diagnosis not present

## 2011-06-30 DIAGNOSIS — M6281 Muscle weakness (generalized): Secondary | ICD-10-CM | POA: Diagnosis not present

## 2011-06-30 DIAGNOSIS — R269 Unspecified abnormalities of gait and mobility: Secondary | ICD-10-CM | POA: Diagnosis not present

## 2011-07-04 DIAGNOSIS — R269 Unspecified abnormalities of gait and mobility: Secondary | ICD-10-CM | POA: Diagnosis not present

## 2011-07-04 DIAGNOSIS — M6281 Muscle weakness (generalized): Secondary | ICD-10-CM | POA: Diagnosis not present

## 2011-07-05 DIAGNOSIS — M6281 Muscle weakness (generalized): Secondary | ICD-10-CM | POA: Diagnosis not present

## 2011-07-05 DIAGNOSIS — R269 Unspecified abnormalities of gait and mobility: Secondary | ICD-10-CM | POA: Diagnosis not present

## 2011-07-06 DIAGNOSIS — R05 Cough: Secondary | ICD-10-CM | POA: Diagnosis not present

## 2011-07-06 DIAGNOSIS — R079 Chest pain, unspecified: Secondary | ICD-10-CM | POA: Diagnosis not present

## 2011-07-07 DIAGNOSIS — M6281 Muscle weakness (generalized): Secondary | ICD-10-CM | POA: Diagnosis not present

## 2011-07-07 DIAGNOSIS — R269 Unspecified abnormalities of gait and mobility: Secondary | ICD-10-CM | POA: Diagnosis not present

## 2011-07-10 DIAGNOSIS — R269 Unspecified abnormalities of gait and mobility: Secondary | ICD-10-CM | POA: Diagnosis not present

## 2011-07-10 DIAGNOSIS — M6281 Muscle weakness (generalized): Secondary | ICD-10-CM | POA: Diagnosis not present

## 2011-07-12 DIAGNOSIS — R269 Unspecified abnormalities of gait and mobility: Secondary | ICD-10-CM | POA: Diagnosis not present

## 2011-07-12 DIAGNOSIS — M6281 Muscle weakness (generalized): Secondary | ICD-10-CM | POA: Diagnosis not present

## 2011-07-14 DIAGNOSIS — R269 Unspecified abnormalities of gait and mobility: Secondary | ICD-10-CM | POA: Diagnosis not present

## 2011-07-14 DIAGNOSIS — M6281 Muscle weakness (generalized): Secondary | ICD-10-CM | POA: Diagnosis not present

## 2011-07-17 DIAGNOSIS — R269 Unspecified abnormalities of gait and mobility: Secondary | ICD-10-CM | POA: Diagnosis not present

## 2011-07-17 DIAGNOSIS — M6281 Muscle weakness (generalized): Secondary | ICD-10-CM | POA: Diagnosis not present

## 2011-07-19 ENCOUNTER — Ambulatory Visit: Payer: Medicare Other | Admitting: Internal Medicine

## 2011-07-19 DIAGNOSIS — M6281 Muscle weakness (generalized): Secondary | ICD-10-CM | POA: Diagnosis not present

## 2011-07-19 DIAGNOSIS — R269 Unspecified abnormalities of gait and mobility: Secondary | ICD-10-CM | POA: Diagnosis not present

## 2011-07-21 DIAGNOSIS — M6281 Muscle weakness (generalized): Secondary | ICD-10-CM | POA: Diagnosis not present

## 2011-07-21 DIAGNOSIS — R269 Unspecified abnormalities of gait and mobility: Secondary | ICD-10-CM | POA: Diagnosis not present

## 2011-07-24 ENCOUNTER — Encounter: Payer: Self-pay | Admitting: Cardiovascular Disease

## 2011-07-24 ENCOUNTER — Ambulatory Visit (INDEPENDENT_AMBULATORY_CARE_PROVIDER_SITE_OTHER): Payer: Medicare Other | Admitting: Cardiovascular Disease

## 2011-07-24 VITALS — BP 166/72 | HR 72 | Ht 67.0 in | Wt 153.0 lb

## 2011-07-24 DIAGNOSIS — M6281 Muscle weakness (generalized): Secondary | ICD-10-CM | POA: Diagnosis not present

## 2011-07-24 DIAGNOSIS — R42 Dizziness and giddiness: Secondary | ICD-10-CM

## 2011-07-24 DIAGNOSIS — I25810 Atherosclerosis of coronary artery bypass graft(s) without angina pectoris: Secondary | ICD-10-CM | POA: Diagnosis not present

## 2011-07-24 DIAGNOSIS — E785 Hyperlipidemia, unspecified: Secondary | ICD-10-CM

## 2011-07-24 DIAGNOSIS — I2510 Atherosclerotic heart disease of native coronary artery without angina pectoris: Secondary | ICD-10-CM

## 2011-07-24 DIAGNOSIS — I6529 Occlusion and stenosis of unspecified carotid artery: Secondary | ICD-10-CM

## 2011-07-24 DIAGNOSIS — I779 Disorder of arteries and arterioles, unspecified: Secondary | ICD-10-CM

## 2011-07-24 DIAGNOSIS — I10 Essential (primary) hypertension: Secondary | ICD-10-CM | POA: Insufficient documentation

## 2011-07-24 DIAGNOSIS — R269 Unspecified abnormalities of gait and mobility: Secondary | ICD-10-CM | POA: Diagnosis not present

## 2011-07-24 NOTE — Patient Instructions (Addendum)
You are doing well. No medication changes were made. Please monitor your blood pressure three times a week. Call the office with your numbers We will schedule you for a carotid ultrasound this summer  Please call us if you have new issues that need to be addressed before your next appt.  Your physician wants you to follow-up in: 6 months.  You will receive a reminder letter in the mail two months in advance. If you don't receive a letter, please call our office to schedule the follow-up appointment.

## 2011-07-24 NOTE — Assessment & Plan Note (Signed)
Cholesterol is at goal on the current lipid regimen. No changes to the medications were made.  

## 2011-07-24 NOTE — Progress Notes (Signed)
Patient ID: Chelsea [name], female    DOB: [birth year], 78 y.o.   MRN: [id number]  HPI Comments: 78 year-old woman with CAD s/p CABG, diastolic dysfunction, carotid endarterectomy on the left 10 years ago, mildly dilated left atrium, mild MR, presenting today for follow-up evaluation. she had dizziness in the past and her amlodipine was decreased from 10 mg to 5 mg. Since we have last seen her in August 2012, she had a fall with right hip fracture, right arm fracture, 3 ribs injured. She had a long recovery in [place] where the injury occurred. She is now back at [facility] and doing physical therapy 3 times per week. She is recovering from a upper respiratory infection.   She does not complain of any dizziness but does have gait instability and is using a walker. She has not been watching her blood pressure. Overall she has no complaints   Previous echo in 05/2009 showed normal LV function without valvular abnormalities   stress test was negative for ischemia.   carotid ultrasound last year showed mild bilateral disease Cholesterol is well controlled with LDL less than 70   EKG shows normal sinus rhythm with a rate of 65 beats per minute, no significant ST or T wave changes     Outpatient Encounter Prescriptions as of 07/24/2011  Medication Sig Dispense Refill  . albuterol (PROVENTIL HFA;VENTOLIN HFA) 108 (90 BASE) MCG/ACT inhaler Inhale 2 puffs into the lungs every 6 (six) hours as needed.      Marland Kitchen amLODipine (NORVASC) 10 MG tablet Take 5 mg by mouth daily.       Marland Kitchen aspirin 81 MG tablet Take two tablets daily.       Marland Kitchen atorvastatin (LIPITOR) 80 MG tablet Take 1 tablet (80 mg total) by mouth daily.  90 tablet  4  . budesonide-formoterol (SYMBICORT) 80-4.5 MCG/ACT inhaler Inhale 2 puffs into the lungs 2 (two) times daily.        . diazepam (VALIUM) 5 MG tablet Take 5 mg by mouth every 6 (six) hours as needed.        . lansoprazole (PREVACID) 30 MG capsule Take 30 mg by mouth daily.        Marland Kitchen  levothyroxine (SYNTHROID, LEVOTHROID) 75 MCG tablet Take 75 mcg by mouth daily.        . Multiple Minerals-Vitamins (CALCIUM CITRATE +) TABS Take 1 tablet by mouth daily.        . Multiple Vitamins-Minerals (OCUVITE PRESERVISION PO) Take 1 capsule by mouth daily.        . nitroGLYCERIN (NITRODUR - DOSED IN MG/24 HR) 0.4 mg/hr Place 1 patch onto the skin daily as needed.        . nitroGLYCERIN (NITROSTAT) 0.4 MG SL tablet Place 0.4 mg under the tongue every 5 (five) minutes as needed.        . sertraline (ZOLOFT) 50 MG tablet Take 50 mg by mouth daily.        Marland Kitchen tiotropium (SPIRIVA) 18 MCG inhalation capsule Place 18 mcg into inhaler and inhale daily.        Marland Kitchen tolterodine (DETROL LA) 4 MG 24 hr capsule Take 4 mg by mouth daily.           Review of Systems  HENT: Negative.   Eyes: Negative.   Respiratory: Negative.   Cardiovascular: Negative.   Gastrointestinal: Negative.   Musculoskeletal: Positive for gait problem.  Skin: Negative.   Neurological: Positive for weakness.  Hematological: Negative.  Psychiatric/Behavioral: Negative.   All other systems reviewed and are negative.    BP 166/72  Pulse 72  Ht 5\' 7"  (1.702 m)  Wt 153 lb (69.4 kg)  BMI 23.96 kg/m2   Physical Exam  Nursing note and vitals reviewed. Constitutional: She is oriented to person, place, and time. She appears well-developed and well-nourished.  HENT:  Head: Normocephalic.  Nose: Nose normal.  Mouth/Throat: Oropharynx is clear and moist.  Eyes: Conjunctivae are normal. Pupils are equal, round, and reactive to light.  Neck: Normal range of motion. Neck supple. No JVD present.  Cardiovascular: Normal rate, regular rhythm, S1 normal, S2 normal, normal heart sounds and intact distal pulses.  Exam reveals no gallop and no friction rub.   No murmur heard. Pulmonary/Chest: Effort normal and breath sounds normal. No respiratory distress. She has no wheezes. She has no rales. She exhibits no tenderness.  Abdominal:  Soft. Bowel sounds are normal. She exhibits no distension. There is no tenderness.  Musculoskeletal: Normal range of motion. She exhibits no edema and no tenderness.  Lymphadenopathy:    She has no cervical adenopathy.  Neurological: She is alert and oriented to person, place, and time. Coordination normal.  Skin: Skin is warm and dry. No rash noted. No erythema.  Psychiatric: She has a normal mood and affect. Her behavior is normal. Judgment and thought content normal.         Assessment and Plan

## 2011-07-24 NOTE — Assessment & Plan Note (Signed)
Currently with no symptoms of angina. No further workup at this time. Continue current medication regimen. 

## 2011-07-24 NOTE — Assessment & Plan Note (Signed)
I suggested that she have the nurse at [facility] monitor her blood pressure for now and call us with the. Measurements. We could potentially start an ACE inhibitor or ARB for hypertension if needed.

## 2011-07-24 NOTE — Assessment & Plan Note (Signed)
She has not commented on dizziness on today's visit. We'll continue the amlodipine 5 mg daily and closely monitor her blood pressure.

## 2011-07-24 NOTE — Assessment & Plan Note (Signed)
We will schedule her for a repeat carotid ultrasound later this year.

## 2011-07-26 DIAGNOSIS — M6281 Muscle weakness (generalized): Secondary | ICD-10-CM | POA: Diagnosis not present

## 2011-07-26 DIAGNOSIS — R269 Unspecified abnormalities of gait and mobility: Secondary | ICD-10-CM | POA: Diagnosis not present

## 2011-07-27 DIAGNOSIS — H8110 Benign paroxysmal vertigo, unspecified ear: Secondary | ICD-10-CM | POA: Diagnosis not present

## 2011-07-27 DIAGNOSIS — N318 Other neuromuscular dysfunction of bladder: Secondary | ICD-10-CM | POA: Diagnosis not present

## 2011-07-27 DIAGNOSIS — J019 Acute sinusitis, unspecified: Secondary | ICD-10-CM | POA: Diagnosis not present

## 2011-07-28 DIAGNOSIS — M6281 Muscle weakness (generalized): Secondary | ICD-10-CM | POA: Diagnosis not present

## 2011-07-28 DIAGNOSIS — R269 Unspecified abnormalities of gait and mobility: Secondary | ICD-10-CM | POA: Diagnosis not present

## 2011-07-30 ENCOUNTER — Emergency Department: Payer: Self-pay | Admitting: Internal Medicine

## 2011-07-30 DIAGNOSIS — I10 Essential (primary) hypertension: Secondary | ICD-10-CM | POA: Diagnosis not present

## 2011-07-30 DIAGNOSIS — Z951 Presence of aortocoronary bypass graft: Secondary | ICD-10-CM | POA: Diagnosis not present

## 2011-07-30 DIAGNOSIS — J019 Acute sinusitis, unspecified: Secondary | ICD-10-CM | POA: Diagnosis not present

## 2011-07-30 DIAGNOSIS — J0100 Acute maxillary sinusitis, unspecified: Secondary | ICD-10-CM | POA: Diagnosis not present

## 2011-07-30 DIAGNOSIS — Z79899 Other long term (current) drug therapy: Secondary | ICD-10-CM | POA: Diagnosis not present

## 2011-08-01 DIAGNOSIS — G250 Essential tremor: Secondary | ICD-10-CM | POA: Diagnosis not present

## 2011-08-01 DIAGNOSIS — G252 Other specified forms of tremor: Secondary | ICD-10-CM | POA: Diagnosis not present

## 2011-08-01 DIAGNOSIS — R209 Unspecified disturbances of skin sensation: Secondary | ICD-10-CM | POA: Diagnosis not present

## 2011-08-02 DIAGNOSIS — R269 Unspecified abnormalities of gait and mobility: Secondary | ICD-10-CM | POA: Diagnosis not present

## 2011-08-02 DIAGNOSIS — M6281 Muscle weakness (generalized): Secondary | ICD-10-CM | POA: Diagnosis not present

## 2011-08-04 DIAGNOSIS — M6281 Muscle weakness (generalized): Secondary | ICD-10-CM | POA: Diagnosis not present

## 2011-08-04 DIAGNOSIS — R269 Unspecified abnormalities of gait and mobility: Secondary | ICD-10-CM | POA: Diagnosis not present

## 2011-08-08 DIAGNOSIS — S72009D Fracture of unspecified part of neck of unspecified femur, subsequent encounter for closed fracture with routine healing: Secondary | ICD-10-CM | POA: Diagnosis not present

## 2011-08-08 DIAGNOSIS — M6281 Muscle weakness (generalized): Secondary | ICD-10-CM | POA: Diagnosis not present

## 2011-08-08 DIAGNOSIS — M76899 Other specified enthesopathies of unspecified lower limb, excluding foot: Secondary | ICD-10-CM | POA: Diagnosis not present

## 2011-08-08 DIAGNOSIS — R269 Unspecified abnormalities of gait and mobility: Secondary | ICD-10-CM | POA: Diagnosis not present

## 2011-08-11 DIAGNOSIS — R269 Unspecified abnormalities of gait and mobility: Secondary | ICD-10-CM | POA: Diagnosis not present

## 2011-08-11 DIAGNOSIS — M6281 Muscle weakness (generalized): Secondary | ICD-10-CM | POA: Diagnosis not present

## 2011-08-15 ENCOUNTER — Other Ambulatory Visit: Payer: Self-pay | Admitting: Cardiovascular Disease

## 2011-08-15 MED ORDER — AMLODIPINE BESYLATE 5 MG PO TABS: 5.0000 mg | ORAL_TABLET | Freq: Every day | ORAL | Status: DC

## 2011-08-15 NOTE — Telephone Encounter (Signed)
Pt feeling fine, no complaints. Will send to Dr Mariah Milling to review bp readings, refilled norvasc 5mg  tablet. Please advise if different strength needed. Thank you.

## 2011-08-15 NOTE — Telephone Encounter (Signed)
CALLING WITH BP READINGS  2/11 152/78 2/12 146/70 2/19 118/64 2/17 120/70 2/19 148/70 2/27 151/70

## 2011-08-22 DIAGNOSIS — K219 Gastro-esophageal reflux disease without esophagitis: Secondary | ICD-10-CM | POA: Diagnosis not present

## 2011-08-23 DIAGNOSIS — L570 Actinic keratosis: Secondary | ICD-10-CM | POA: Diagnosis not present

## 2011-08-23 DIAGNOSIS — L820 Inflamed seborrheic keratosis: Secondary | ICD-10-CM | POA: Diagnosis not present

## 2011-08-23 DIAGNOSIS — L821 Other seborrheic keratosis: Secondary | ICD-10-CM | POA: Diagnosis not present

## 2011-09-05 DIAGNOSIS — B351 Tinea unguium: Secondary | ICD-10-CM | POA: Diagnosis not present

## 2011-09-05 DIAGNOSIS — M79609 Pain in unspecified limb: Secondary | ICD-10-CM | POA: Diagnosis not present

## 2011-09-11 ENCOUNTER — Other Ambulatory Visit: Payer: Medicare Other

## 2011-09-26 DIAGNOSIS — M76899 Other specified enthesopathies of unspecified lower limb, excluding foot: Secondary | ICD-10-CM | POA: Diagnosis not present

## 2011-10-09 DIAGNOSIS — L219 Seborrheic dermatitis, unspecified: Secondary | ICD-10-CM | POA: Diagnosis not present

## 2011-10-09 DIAGNOSIS — L570 Actinic keratosis: Secondary | ICD-10-CM | POA: Diagnosis not present

## 2011-10-10 ENCOUNTER — Other Ambulatory Visit: Payer: Medicare Other

## 2011-10-18 DIAGNOSIS — J019 Acute sinusitis, unspecified: Secondary | ICD-10-CM | POA: Diagnosis not present

## 2011-10-18 DIAGNOSIS — I10 Essential (primary) hypertension: Secondary | ICD-10-CM | POA: Diagnosis not present

## 2011-10-18 DIAGNOSIS — J309 Allergic rhinitis, unspecified: Secondary | ICD-10-CM | POA: Diagnosis not present

## 2011-11-03 ENCOUNTER — Ambulatory Visit: Payer: Self-pay | Admitting: Family Medicine

## 2011-11-03 DIAGNOSIS — Z1231 Encounter for screening mammogram for malignant neoplasm of breast: Secondary | ICD-10-CM | POA: Diagnosis not present

## 2011-11-03 DIAGNOSIS — R928 Other abnormal and inconclusive findings on diagnostic imaging of breast: Secondary | ICD-10-CM | POA: Diagnosis not present

## 2011-11-07 DIAGNOSIS — M76899 Other specified enthesopathies of unspecified lower limb, excluding foot: Secondary | ICD-10-CM | POA: Diagnosis not present

## 2011-11-16 DIAGNOSIS — L570 Actinic keratosis: Secondary | ICD-10-CM | POA: Diagnosis not present

## 2011-12-04 DIAGNOSIS — J984 Other disorders of lung: Secondary | ICD-10-CM | POA: Diagnosis not present

## 2011-12-04 DIAGNOSIS — J449 Chronic obstructive pulmonary disease, unspecified: Secondary | ICD-10-CM | POA: Diagnosis not present

## 2011-12-04 DIAGNOSIS — J309 Allergic rhinitis, unspecified: Secondary | ICD-10-CM | POA: Diagnosis not present

## 2011-12-04 DIAGNOSIS — J019 Acute sinusitis, unspecified: Secondary | ICD-10-CM | POA: Diagnosis not present

## 2011-12-04 DIAGNOSIS — N390 Urinary tract infection, site not specified: Secondary | ICD-10-CM | POA: Diagnosis not present

## 2011-12-08 ENCOUNTER — Emergency Department: Payer: Self-pay | Admitting: Emergency Medicine

## 2011-12-08 DIAGNOSIS — Z951 Presence of aortocoronary bypass graft: Secondary | ICD-10-CM | POA: Diagnosis not present

## 2011-12-08 DIAGNOSIS — Z9079 Acquired absence of other genital organ(s): Secondary | ICD-10-CM | POA: Diagnosis not present

## 2011-12-08 DIAGNOSIS — K5289 Other specified noninfective gastroenteritis and colitis: Secondary | ICD-10-CM | POA: Diagnosis not present

## 2011-12-08 DIAGNOSIS — R109 Unspecified abdominal pain: Secondary | ICD-10-CM | POA: Diagnosis not present

## 2011-12-08 DIAGNOSIS — R197 Diarrhea, unspecified: Secondary | ICD-10-CM | POA: Diagnosis not present

## 2011-12-08 DIAGNOSIS — S2239XA Fracture of one rib, unspecified side, initial encounter for closed fracture: Secondary | ICD-10-CM | POA: Diagnosis not present

## 2011-12-08 DIAGNOSIS — R6889 Other general symptoms and signs: Secondary | ICD-10-CM | POA: Diagnosis not present

## 2011-12-08 DIAGNOSIS — R1110 Vomiting, unspecified: Secondary | ICD-10-CM | POA: Diagnosis not present

## 2011-12-08 DIAGNOSIS — R0602 Shortness of breath: Secondary | ICD-10-CM | POA: Diagnosis not present

## 2011-12-08 DIAGNOSIS — Z9089 Acquired absence of other organs: Secondary | ICD-10-CM | POA: Diagnosis not present

## 2011-12-08 LAB — COMPREHENSIVE METABOLIC PANEL
Albumin: 3.3 g/dL — ABNORMAL LOW (ref 3.4–5.0)
Alkaline Phosphatase: 189 U/L — ABNORMAL HIGH (ref 50–136)
Anion Gap: 9 (ref 7–16)
BUN: 27 mg/dL — ABNORMAL HIGH (ref 7–18)
Bilirubin,Total: 0.6 mg/dL (ref 0.2–1.0)
Calcium, Total: 8.9 mg/dL (ref 8.5–10.1)
Chloride: 103 mmol/L (ref 98–107)
Co2: 25 mmol/L (ref 21–32)
Creatinine: 1.27 mg/dL (ref 0.60–1.30)
EGFR (African American): 45 — ABNORMAL LOW
Glucose: 99 mg/dL (ref 65–99)
Osmolality: 279 (ref 275–301)
Potassium: 3.9 mmol/L (ref 3.5–5.1)
SGOT(AST): 52 U/L — ABNORMAL HIGH (ref 15–37)
SGPT (ALT): 38 U/L
Sodium: 137 mmol/L (ref 136–145)
Total Protein: 7.7 g/dL (ref 6.4–8.2)

## 2011-12-08 LAB — CBC
HGB: 11.4 g/dL — ABNORMAL LOW (ref 12.0–16.0)
MCHC: 32 g/dL (ref 32.0–36.0)
Platelet: 304 10*3/uL (ref 150–440)
RBC: 3.95 10*6/uL (ref 3.80–5.20)
RDW: 14.8 % — ABNORMAL HIGH (ref 11.5–14.5)
WBC: 14 10*3/uL — ABNORMAL HIGH (ref 3.6–11.0)

## 2011-12-08 LAB — URINALYSIS, COMPLETE
Bacteria: NONE SEEN
Bilirubin,UR: NEGATIVE
Glucose,UR: NEGATIVE mg/dL (ref 0–75)
Ph: 5 (ref 4.5–8.0)
Protein: 30
RBC,UR: 1 /HPF (ref 0–5)
Specific Gravity: 1.024 (ref 1.003–1.030)
Squamous Epithelial: 16
WBC UR: 6 /HPF (ref 0–5)

## 2011-12-08 LAB — TROPONIN I: Troponin-I: <0.02 ng/mL

## 2011-12-11 DIAGNOSIS — M549 Dorsalgia, unspecified: Secondary | ICD-10-CM | POA: Diagnosis not present

## 2011-12-11 DIAGNOSIS — K5289 Other specified noninfective gastroenteritis and colitis: Secondary | ICD-10-CM | POA: Diagnosis not present

## 2011-12-11 DIAGNOSIS — J309 Allergic rhinitis, unspecified: Secondary | ICD-10-CM | POA: Diagnosis not present

## 2011-12-18 DIAGNOSIS — K5900 Constipation, unspecified: Secondary | ICD-10-CM | POA: Diagnosis not present

## 2011-12-18 DIAGNOSIS — K5732 Diverticulitis of large intestine without perforation or abscess without bleeding: Secondary | ICD-10-CM | POA: Diagnosis not present

## 2011-12-18 DIAGNOSIS — M549 Dorsalgia, unspecified: Secondary | ICD-10-CM | POA: Diagnosis not present

## 2011-12-19 ENCOUNTER — Ambulatory Visit: Payer: Self-pay | Admitting: Specialist

## 2011-12-19 DIAGNOSIS — J439 Emphysema, unspecified: Secondary | ICD-10-CM | POA: Diagnosis not present

## 2011-12-19 DIAGNOSIS — K449 Diaphragmatic hernia without obstruction or gangrene: Secondary | ICD-10-CM | POA: Diagnosis not present

## 2011-12-19 DIAGNOSIS — J984 Other disorders of lung: Secondary | ICD-10-CM | POA: Diagnosis not present

## 2011-12-19 DIAGNOSIS — J438 Other emphysema: Secondary | ICD-10-CM | POA: Diagnosis not present

## 2011-12-26 DIAGNOSIS — M25559 Pain in unspecified hip: Secondary | ICD-10-CM | POA: Diagnosis not present

## 2011-12-27 DIAGNOSIS — M25559 Pain in unspecified hip: Secondary | ICD-10-CM | POA: Diagnosis not present

## 2011-12-27 DIAGNOSIS — M47817 Spondylosis without myelopathy or radiculopathy, lumbosacral region: Secondary | ICD-10-CM | POA: Diagnosis not present

## 2011-12-27 DIAGNOSIS — M76899 Other specified enthesopathies of unspecified lower limb, excluding foot: Secondary | ICD-10-CM | POA: Diagnosis not present

## 2011-12-27 DIAGNOSIS — M5137 Other intervertebral disc degeneration, lumbosacral region: Secondary | ICD-10-CM | POA: Diagnosis not present

## 2011-12-27 DIAGNOSIS — IMO0002 Reserved for concepts with insufficient information to code with codable children: Secondary | ICD-10-CM | POA: Diagnosis not present

## 2011-12-29 ENCOUNTER — Ambulatory Visit: Payer: Self-pay | Admitting: Physical Medicine and Rehabilitation

## 2011-12-29 DIAGNOSIS — M51379 Other intervertebral disc degeneration, lumbosacral region without mention of lumbar back pain or lower extremity pain: Secondary | ICD-10-CM | POA: Diagnosis not present

## 2011-12-29 DIAGNOSIS — IMO0002 Reserved for concepts with insufficient information to code with codable children: Secondary | ICD-10-CM | POA: Diagnosis not present

## 2011-12-29 DIAGNOSIS — M545 Low back pain, unspecified: Secondary | ICD-10-CM | POA: Diagnosis not present

## 2011-12-29 DIAGNOSIS — M79609 Pain in unspecified limb: Secondary | ICD-10-CM | POA: Diagnosis not present

## 2011-12-29 DIAGNOSIS — M5030 Other cervical disc degeneration, unspecified cervical region: Secondary | ICD-10-CM | POA: Diagnosis not present

## 2011-12-29 DIAGNOSIS — M5137 Other intervertebral disc degeneration, lumbosacral region: Secondary | ICD-10-CM | POA: Diagnosis not present

## 2011-12-29 DIAGNOSIS — M47817 Spondylosis without myelopathy or radiculopathy, lumbosacral region: Secondary | ICD-10-CM | POA: Diagnosis not present

## 2011-12-29 DIAGNOSIS — M5126 Other intervertebral disc displacement, lumbar region: Secondary | ICD-10-CM | POA: Diagnosis not present

## 2012-01-03 DIAGNOSIS — M47817 Spondylosis without myelopathy or radiculopathy, lumbosacral region: Secondary | ICD-10-CM | POA: Diagnosis not present

## 2012-01-03 DIAGNOSIS — M48062 Spinal stenosis, lumbar region with neurogenic claudication: Secondary | ICD-10-CM | POA: Diagnosis not present

## 2012-01-03 DIAGNOSIS — IMO0002 Reserved for concepts with insufficient information to code with codable children: Secondary | ICD-10-CM | POA: Diagnosis not present

## 2012-01-07 ENCOUNTER — Emergency Department: Payer: Self-pay | Admitting: Emergency Medicine

## 2012-01-07 DIAGNOSIS — Z79899 Other long term (current) drug therapy: Secondary | ICD-10-CM | POA: Diagnosis not present

## 2012-01-07 DIAGNOSIS — R6889 Other general symptoms and signs: Secondary | ICD-10-CM | POA: Diagnosis not present

## 2012-01-07 DIAGNOSIS — R109 Unspecified abdominal pain: Secondary | ICD-10-CM | POA: Diagnosis not present

## 2012-01-07 DIAGNOSIS — K5900 Constipation, unspecified: Secondary | ICD-10-CM | POA: Diagnosis not present

## 2012-01-07 DIAGNOSIS — Z7982 Long term (current) use of aspirin: Secondary | ICD-10-CM | POA: Diagnosis not present

## 2012-01-07 DIAGNOSIS — N390 Urinary tract infection, site not specified: Secondary | ICD-10-CM | POA: Diagnosis not present

## 2012-01-07 LAB — URINALYSIS, COMPLETE
Bilirubin,UR: NEGATIVE
Glucose,UR: NEGATIVE mg/dL (ref 0–75)
Ketone: NEGATIVE
Nitrite: NEGATIVE
Protein: 30
Specific Gravity: 1.014 (ref 1.003–1.030)
Squamous Epithelial: 3
WBC UR: 136 /HPF (ref 0–5)

## 2012-01-12 DIAGNOSIS — M6281 Muscle weakness (generalized): Secondary | ICD-10-CM | POA: Diagnosis not present

## 2012-01-12 DIAGNOSIS — M545 Low back pain: Secondary | ICD-10-CM | POA: Diagnosis not present

## 2012-01-12 DIAGNOSIS — R262 Difficulty in walking, not elsewhere classified: Secondary | ICD-10-CM | POA: Diagnosis not present

## 2012-01-15 DIAGNOSIS — M545 Low back pain: Secondary | ICD-10-CM | POA: Diagnosis not present

## 2012-01-15 DIAGNOSIS — M6281 Muscle weakness (generalized): Secondary | ICD-10-CM | POA: Diagnosis not present

## 2012-01-15 DIAGNOSIS — R262 Difficulty in walking, not elsewhere classified: Secondary | ICD-10-CM | POA: Diagnosis not present

## 2012-01-16 DIAGNOSIS — R262 Difficulty in walking, not elsewhere classified: Secondary | ICD-10-CM | POA: Diagnosis not present

## 2012-01-16 DIAGNOSIS — I2510 Atherosclerotic heart disease of native coronary artery without angina pectoris: Secondary | ICD-10-CM | POA: Diagnosis not present

## 2012-01-16 DIAGNOSIS — M6281 Muscle weakness (generalized): Secondary | ICD-10-CM | POA: Diagnosis not present

## 2012-01-16 DIAGNOSIS — M48061 Spinal stenosis, lumbar region without neurogenic claudication: Secondary | ICD-10-CM

## 2012-01-16 DIAGNOSIS — J449 Chronic obstructive pulmonary disease, unspecified: Secondary | ICD-10-CM

## 2012-01-16 DIAGNOSIS — M545 Low back pain: Secondary | ICD-10-CM | POA: Diagnosis not present

## 2012-01-16 DIAGNOSIS — K219 Gastro-esophageal reflux disease without esophagitis: Secondary | ICD-10-CM

## 2012-01-17 DIAGNOSIS — M545 Low back pain: Secondary | ICD-10-CM | POA: Diagnosis not present

## 2012-01-17 DIAGNOSIS — M6281 Muscle weakness (generalized): Secondary | ICD-10-CM | POA: Diagnosis not present

## 2012-01-17 DIAGNOSIS — R262 Difficulty in walking, not elsewhere classified: Secondary | ICD-10-CM | POA: Diagnosis not present

## 2012-01-18 DIAGNOSIS — M545 Low back pain: Secondary | ICD-10-CM | POA: Diagnosis not present

## 2012-01-18 DIAGNOSIS — IMO0002 Reserved for concepts with insufficient information to code with codable children: Secondary | ICD-10-CM | POA: Diagnosis not present

## 2012-01-19 DIAGNOSIS — M545 Low back pain: Secondary | ICD-10-CM | POA: Diagnosis not present

## 2012-01-19 DIAGNOSIS — IMO0002 Reserved for concepts with insufficient information to code with codable children: Secondary | ICD-10-CM | POA: Diagnosis not present

## 2012-01-21 DIAGNOSIS — M545 Low back pain: Secondary | ICD-10-CM | POA: Diagnosis not present

## 2012-01-21 DIAGNOSIS — IMO0002 Reserved for concepts with insufficient information to code with codable children: Secondary | ICD-10-CM | POA: Diagnosis not present

## 2012-01-22 ENCOUNTER — Ambulatory Visit: Payer: Medicare Other | Admitting: Cardiovascular Disease

## 2012-01-22 DIAGNOSIS — B370 Candidal stomatitis: Secondary | ICD-10-CM | POA: Diagnosis not present

## 2012-01-22 DIAGNOSIS — IMO0002 Reserved for concepts with insufficient information to code with codable children: Secondary | ICD-10-CM | POA: Diagnosis not present

## 2012-01-22 DIAGNOSIS — M545 Low back pain: Secondary | ICD-10-CM | POA: Diagnosis not present

## 2012-01-23 DIAGNOSIS — M545 Low back pain: Secondary | ICD-10-CM | POA: Diagnosis not present

## 2012-01-23 DIAGNOSIS — IMO0002 Reserved for concepts with insufficient information to code with codable children: Secondary | ICD-10-CM | POA: Diagnosis not present

## 2012-01-24 DIAGNOSIS — IMO0002 Reserved for concepts with insufficient information to code with codable children: Secondary | ICD-10-CM | POA: Diagnosis not present

## 2012-01-24 DIAGNOSIS — M545 Low back pain: Secondary | ICD-10-CM | POA: Diagnosis not present

## 2012-01-25 DIAGNOSIS — IMO0002 Reserved for concepts with insufficient information to code with codable children: Secondary | ICD-10-CM | POA: Diagnosis not present

## 2012-01-25 DIAGNOSIS — M545 Low back pain: Secondary | ICD-10-CM | POA: Diagnosis not present

## 2012-01-29 DIAGNOSIS — M545 Low back pain: Secondary | ICD-10-CM | POA: Diagnosis not present

## 2012-01-29 DIAGNOSIS — M6281 Muscle weakness (generalized): Secondary | ICD-10-CM | POA: Diagnosis not present

## 2012-01-30 DIAGNOSIS — IMO0002 Reserved for concepts with insufficient information to code with codable children: Secondary | ICD-10-CM | POA: Diagnosis not present

## 2012-01-30 DIAGNOSIS — M48062 Spinal stenosis, lumbar region with neurogenic claudication: Secondary | ICD-10-CM | POA: Diagnosis not present

## 2012-01-30 DIAGNOSIS — M47817 Spondylosis without myelopathy or radiculopathy, lumbosacral region: Secondary | ICD-10-CM | POA: Diagnosis not present

## 2012-01-31 DIAGNOSIS — M545 Low back pain: Secondary | ICD-10-CM | POA: Diagnosis not present

## 2012-01-31 DIAGNOSIS — M6281 Muscle weakness (generalized): Secondary | ICD-10-CM | POA: Diagnosis not present

## 2012-02-01 DIAGNOSIS — M6281 Muscle weakness (generalized): Secondary | ICD-10-CM | POA: Diagnosis not present

## 2012-02-01 DIAGNOSIS — M545 Low back pain: Secondary | ICD-10-CM | POA: Diagnosis not present

## 2012-02-05 DIAGNOSIS — F4320 Adjustment disorder, unspecified: Secondary | ICD-10-CM | POA: Diagnosis not present

## 2012-02-05 DIAGNOSIS — M4800 Spinal stenosis, site unspecified: Secondary | ICD-10-CM | POA: Diagnosis not present

## 2012-02-05 DIAGNOSIS — I10 Essential (primary) hypertension: Secondary | ICD-10-CM | POA: Diagnosis not present

## 2012-02-05 DIAGNOSIS — M961 Postlaminectomy syndrome, not elsewhere classified: Secondary | ICD-10-CM | POA: Diagnosis not present

## 2012-02-05 DIAGNOSIS — D649 Anemia, unspecified: Secondary | ICD-10-CM | POA: Diagnosis not present

## 2012-02-05 DIAGNOSIS — K5900 Constipation, unspecified: Secondary | ICD-10-CM | POA: Diagnosis not present

## 2012-02-06 DIAGNOSIS — M6281 Muscle weakness (generalized): Secondary | ICD-10-CM | POA: Diagnosis not present

## 2012-02-06 DIAGNOSIS — M545 Low back pain: Secondary | ICD-10-CM | POA: Diagnosis not present

## 2012-02-08 DIAGNOSIS — M545 Low back pain: Secondary | ICD-10-CM | POA: Diagnosis not present

## 2012-02-08 DIAGNOSIS — M6281 Muscle weakness (generalized): Secondary | ICD-10-CM | POA: Diagnosis not present

## 2012-02-09 DIAGNOSIS — M545 Low back pain: Secondary | ICD-10-CM | POA: Diagnosis not present

## 2012-02-09 DIAGNOSIS — M6281 Muscle weakness (generalized): Secondary | ICD-10-CM | POA: Diagnosis not present

## 2012-02-12 DIAGNOSIS — B351 Tinea unguium: Secondary | ICD-10-CM | POA: Diagnosis not present

## 2012-02-12 DIAGNOSIS — M79609 Pain in unspecified limb: Secondary | ICD-10-CM | POA: Diagnosis not present

## 2012-02-13 DIAGNOSIS — M545 Low back pain: Secondary | ICD-10-CM | POA: Diagnosis not present

## 2012-02-13 DIAGNOSIS — M6281 Muscle weakness (generalized): Secondary | ICD-10-CM | POA: Diagnosis not present

## 2012-02-14 DIAGNOSIS — M545 Low back pain: Secondary | ICD-10-CM | POA: Diagnosis not present

## 2012-02-14 DIAGNOSIS — M6281 Muscle weakness (generalized): Secondary | ICD-10-CM | POA: Diagnosis not present

## 2012-02-15 DIAGNOSIS — M6281 Muscle weakness (generalized): Secondary | ICD-10-CM | POA: Diagnosis not present

## 2012-02-15 DIAGNOSIS — M545 Low back pain: Secondary | ICD-10-CM | POA: Diagnosis not present

## 2012-02-21 DIAGNOSIS — M545 Low back pain: Secondary | ICD-10-CM | POA: Diagnosis not present

## 2012-02-21 DIAGNOSIS — M6281 Muscle weakness (generalized): Secondary | ICD-10-CM | POA: Diagnosis not present

## 2012-02-22 ENCOUNTER — Encounter (INDEPENDENT_AMBULATORY_CARE_PROVIDER_SITE_OTHER): Payer: Medicare Other

## 2012-02-22 DIAGNOSIS — M76899 Other specified enthesopathies of unspecified lower limb, excluding foot: Secondary | ICD-10-CM | POA: Diagnosis not present

## 2012-02-22 DIAGNOSIS — I6529 Occlusion and stenosis of unspecified carotid artery: Secondary | ICD-10-CM

## 2012-02-22 DIAGNOSIS — IMO0002 Reserved for concepts with insufficient information to code with codable children: Secondary | ICD-10-CM | POA: Diagnosis not present

## 2012-02-22 DIAGNOSIS — M47817 Spondylosis without myelopathy or radiculopathy, lumbosacral region: Secondary | ICD-10-CM | POA: Diagnosis not present

## 2012-02-22 DIAGNOSIS — M48062 Spinal stenosis, lumbar region with neurogenic claudication: Secondary | ICD-10-CM | POA: Diagnosis not present

## 2012-02-26 ENCOUNTER — Ambulatory Visit: Payer: Medicare Other | Admitting: Cardiovascular Disease

## 2012-03-05 DIAGNOSIS — M48062 Spinal stenosis, lumbar region with neurogenic claudication: Secondary | ICD-10-CM | POA: Diagnosis not present

## 2012-03-05 DIAGNOSIS — IMO0002 Reserved for concepts with insufficient information to code with codable children: Secondary | ICD-10-CM | POA: Diagnosis not present

## 2012-03-05 DIAGNOSIS — M47817 Spondylosis without myelopathy or radiculopathy, lumbosacral region: Secondary | ICD-10-CM | POA: Diagnosis not present

## 2012-03-11 ENCOUNTER — Ambulatory Visit (INDEPENDENT_AMBULATORY_CARE_PROVIDER_SITE_OTHER): Payer: Medicare Other | Admitting: Cardiovascular Disease

## 2012-03-11 ENCOUNTER — Encounter: Payer: Self-pay | Admitting: Cardiovascular Disease

## 2012-03-11 VITALS — BP 148/70 | HR 77 | Ht 63.0 in | Wt 147.5 lb

## 2012-03-11 DIAGNOSIS — I6529 Occlusion and stenosis of unspecified carotid artery: Secondary | ICD-10-CM | POA: Diagnosis not present

## 2012-03-11 DIAGNOSIS — I25810 Atherosclerosis of coronary artery bypass graft(s) without angina pectoris: Secondary | ICD-10-CM | POA: Diagnosis not present

## 2012-03-11 DIAGNOSIS — I10 Essential (primary) hypertension: Secondary | ICD-10-CM | POA: Diagnosis not present

## 2012-03-11 DIAGNOSIS — E785 Hyperlipidemia, unspecified: Secondary | ICD-10-CM

## 2012-03-11 NOTE — Assessment & Plan Note (Signed)
We have suggested that she stay on her Lipitor

## 2012-03-11 NOTE — Patient Instructions (Addendum)
You are doing well. No medication changes were made.  Please call us if you have new issues that need to be addressed before your next appt.  Your physician wants you to follow-up in: 6 months.  You will receive a reminder letter in the mail two months in advance. If you don't receive a letter, please call our office to schedule the follow-up appointment.   

## 2012-03-11 NOTE — Progress Notes (Signed)
Patient ID: Chelsea [name], female    DOB: 06-29-1923, 78 y.o.   MRN: [id number]  HPI Comments: 78 year-old woman with CAD s/p CABG, diastolic dysfunction, carotid endarterectomy on the left 10 years ago, now with 40-59% carotid disease , mildly dilated left atrium, mild MR, presenting today for follow-up evaluation. she had dizziness in the past and her amlodipine was decreased from 10 mg to 5 mg. Since we have last seen her in August 2012, she had a fall with right hip fracture, right arm fracture, 3 ribs injured. She had a long recovery in [place] where the injury occurred. She is now back at [facility] and doing physical therapy 3 times per week. Previous upper respiratory infection.  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 She has had significant pain, cortisone shots x3 to her hip now wearing a TENS unit for pain. She reports having moderate spinal stenosis.  She denies any significant chest pain or shortness of breath. She wonders if she can cut back on any of her medications but she is only taking aspirin, statin, amlodipine. Blood pressure at home has been within a reasonable range by her report   Previous echo in 05/2009 showed normal LV function without valvular abnormalities   stress test was negative for ischemia.   carotid ultrasound last year showed mild bilateral disease Cholesterol is well controlled with LDL less than 70   EKG shows normal sinus rhythm with a rate of 77 beats per minute, no significant ST or T wave changes     Outpatient Encounter Prescriptions as of 03/11/2012  Medication Sig Dispense Refill  . albuterol (PROVENTIL HFA;VENTOLIN HFA) 108 (90 BASE) MCG/ACT inhaler Inhale 2 puffs into the lungs every 6 (six) hours as needed.      Marland Kitchen amLODipine (NORVASC) 10 MG tablet Take 10 mg by mouth daily.      Marland Kitchen aspirin 81 MG tablet Take two tablets daily.       Marland Kitchen atorvastatin (LIPITOR) 80 MG tablet Take 1 tablet (80  mg total) by mouth daily.  90 tablet  4  . budesonide-formoterol (SYMBICORT) 80-4.5 MCG/ACT inhaler Inhale 2 puffs into the lungs 2 (two) times daily.        . diazepam (VALIUM) 5 MG tablet Take 5 mg by mouth every 6 (six) hours as needed.        . lansoprazole (PREVACID) 30 MG capsule Take 30 mg by mouth 2 (two) times daily.       Marland Kitchen levothyroxine (SYNTHROID, LEVOTHROID) 75 MCG tablet Take 75 mcg by mouth daily.        Marland Kitchen lidocaine (LIDODERM) 5 % Place 1 patch onto the skin daily. Remove & Discard patch within 12 hours or as directed by MD      . Multiple Minerals-Vitamins (CALCIUM CITRATE +) TABS Take 1 tablet by mouth daily.        . Multiple Vitamins-Minerals (OCUVITE PRESERVISION PO) Take 1 capsule by mouth daily.        . nitroGLYCERIN (NITROSTAT) 0.4 MG SL tablet Place 0.4 mg under the tongue every 5 (five) minutes as needed.        . NYSTATIN PO Take by mouth 4 (four) times daily.      Marland Kitchen oxyCODONE-acetaminophen (PERCOCET) 10-325 MG per tablet Take 1 tablet by mouth every 4 (four) hours as needed.      . Polyethylene Glycol 3350 (MIRALAX PO) Take by mouth daily.      Marland Kitchen  sertraline (ZOLOFT) 100 MG tablet Take 100 mg by mouth daily.      Marland Kitchen tiotropium (SPIRIVA) 18 MCG inhalation capsule Place 18 mcg into inhaler and inhale daily.        Marland Kitchen tolterodine (DETROL LA) 4 MG 24 hr capsule Take 4 mg by mouth daily.        Marland Kitchen DISCONTD: amLODipine (NORVASC) 5 MG tablet Take 1 tablet (5 mg total) by mouth daily.  90 tablet  1  . DISCONTD: nitroGLYCERIN (NITRODUR - DOSED IN MG/24 HR) 0.4 mg/hr Place 1 patch onto the skin daily as needed.        Marland Kitchen DISCONTD: sertraline (ZOLOFT) 50 MG tablet Take 50 mg by mouth daily.           Review of Systems  HENT: Negative.   Eyes: Negative.   Respiratory: Negative.   Cardiovascular: Negative.   Gastrointestinal: Negative.   Musculoskeletal: Positive for gait problem.  Skin: Negative.   Neurological: Positive for weakness.  Hematological: Negative.     Psychiatric/Behavioral: Negative.   All other systems reviewed and are negative.    BP 148/70  Pulse 77  Ht 5\' 3"  (1.6 m)  Wt 147 lb 8 oz (66.906 kg)  BMI 26.13 kg/m2  Physical Exam  Nursing note and vitals reviewed. Constitutional: She is oriented to person, place, and time. She appears well-developed and well-nourished.  HENT:  Head: Normocephalic.  Nose: Nose normal.  Mouth/Throat: Oropharynx is clear and moist.  Eyes: Conjunctivae normal are normal. Pupils are equal, round, and reactive to light.  Neck: Normal range of motion. Neck supple. No JVD present.  Cardiovascular: Normal rate, regular rhythm, S1 normal, S2 normal, normal heart sounds and intact distal pulses.  Exam reveals no gallop and no friction rub.   No murmur heard. Pulmonary/Chest: Effort normal and breath sounds normal. No respiratory distress. She has no wheezes. She has no rales. She exhibits no tenderness.  Abdominal: Soft. Bowel sounds are normal. She exhibits no distension. There is no tenderness.  Musculoskeletal: Normal range of motion. She exhibits no edema and no tenderness.  Lymphadenopathy:    She has no cervical adenopathy.  Neurological: She is alert and oriented to person, place, and time. Coordination normal.  Skin: Skin is warm and dry. No rash noted. No erythema.  Psychiatric: She has a normal mood and affect. Her behavior is normal. Judgment and thought content normal.         Assessment and Plan

## 2012-03-11 NOTE — Assessment & Plan Note (Signed)
Blood pressure is well controlled on today's visit. No changes made to the medications. 

## 2012-03-11 NOTE — Assessment & Plan Note (Signed)
Continue Lipitor, recheck carotid in 1 year. 40-59% disease

## 2012-03-11 NOTE — Assessment & Plan Note (Signed)
Currently with no symptoms of angina. No further workup at this time. Continue current medication regimen. 

## 2012-03-18 DIAGNOSIS — Z23 Encounter for immunization: Secondary | ICD-10-CM | POA: Diagnosis not present

## 2012-03-18 DIAGNOSIS — M961 Postlaminectomy syndrome, not elsewhere classified: Secondary | ICD-10-CM | POA: Diagnosis not present

## 2012-03-18 DIAGNOSIS — R5381 Other malaise: Secondary | ICD-10-CM | POA: Diagnosis not present

## 2012-03-18 DIAGNOSIS — B370 Candidal stomatitis: Secondary | ICD-10-CM | POA: Diagnosis not present

## 2012-03-18 DIAGNOSIS — R5383 Other fatigue: Secondary | ICD-10-CM | POA: Diagnosis not present

## 2012-03-25 ENCOUNTER — Other Ambulatory Visit: Payer: Self-pay | Admitting: Cardiovascular Disease

## 2012-03-25 DIAGNOSIS — M47817 Spondylosis without myelopathy or radiculopathy, lumbosacral region: Secondary | ICD-10-CM | POA: Diagnosis not present

## 2012-03-25 DIAGNOSIS — M48062 Spinal stenosis, lumbar region with neurogenic claudication: Secondary | ICD-10-CM | POA: Diagnosis not present

## 2012-03-25 DIAGNOSIS — IMO0002 Reserved for concepts with insufficient information to code with codable children: Secondary | ICD-10-CM | POA: Diagnosis not present

## 2012-03-25 MED ORDER — ATORVASTATIN CALCIUM 80 MG PO TABS: 80.0000 mg | ORAL_TABLET | Freq: Every day | ORAL | Status: DC

## 2012-03-28 DIAGNOSIS — L819 Disorder of pigmentation, unspecified: Secondary | ICD-10-CM | POA: Diagnosis not present

## 2012-03-28 DIAGNOSIS — L570 Actinic keratosis: Secondary | ICD-10-CM | POA: Diagnosis not present

## 2012-03-28 DIAGNOSIS — L821 Other seborrheic keratosis: Secondary | ICD-10-CM | POA: Diagnosis not present

## 2012-03-28 DIAGNOSIS — L219 Seborrheic dermatitis, unspecified: Secondary | ICD-10-CM | POA: Diagnosis not present

## 2012-04-05 DIAGNOSIS — H353190 Nonexudative age-related macular degeneration, unspecified eye, stage unspecified: Secondary | ICD-10-CM | POA: Diagnosis not present

## 2012-04-09 DIAGNOSIS — M47817 Spondylosis without myelopathy or radiculopathy, lumbosacral region: Secondary | ICD-10-CM | POA: Diagnosis not present

## 2012-04-09 DIAGNOSIS — M48062 Spinal stenosis, lumbar region with neurogenic claudication: Secondary | ICD-10-CM | POA: Diagnosis not present

## 2012-04-09 DIAGNOSIS — M76899 Other specified enthesopathies of unspecified lower limb, excluding foot: Secondary | ICD-10-CM | POA: Diagnosis not present

## 2012-04-09 DIAGNOSIS — IMO0002 Reserved for concepts with insufficient information to code with codable children: Secondary | ICD-10-CM | POA: Diagnosis not present

## 2012-04-18 DIAGNOSIS — M48062 Spinal stenosis, lumbar region with neurogenic claudication: Secondary | ICD-10-CM | POA: Diagnosis not present

## 2012-04-18 DIAGNOSIS — M79609 Pain in unspecified limb: Secondary | ICD-10-CM | POA: Diagnosis not present

## 2012-04-18 DIAGNOSIS — M76899 Other specified enthesopathies of unspecified lower limb, excluding foot: Secondary | ICD-10-CM | POA: Diagnosis not present

## 2012-04-18 DIAGNOSIS — IMO0002 Reserved for concepts with insufficient information to code with codable children: Secondary | ICD-10-CM | POA: Diagnosis not present

## 2012-04-18 DIAGNOSIS — M25569 Pain in unspecified knee: Secondary | ICD-10-CM | POA: Diagnosis not present

## 2012-04-18 DIAGNOSIS — M47817 Spondylosis without myelopathy or radiculopathy, lumbosacral region: Secondary | ICD-10-CM | POA: Diagnosis not present

## 2012-04-19 DIAGNOSIS — R262 Difficulty in walking, not elsewhere classified: Secondary | ICD-10-CM | POA: Diagnosis not present

## 2012-04-19 DIAGNOSIS — M25569 Pain in unspecified knee: Secondary | ICD-10-CM | POA: Diagnosis not present

## 2012-04-19 DIAGNOSIS — M6281 Muscle weakness (generalized): Secondary | ICD-10-CM | POA: Diagnosis not present

## 2012-04-22 DIAGNOSIS — M25569 Pain in unspecified knee: Secondary | ICD-10-CM | POA: Diagnosis not present

## 2012-04-22 DIAGNOSIS — M6281 Muscle weakness (generalized): Secondary | ICD-10-CM | POA: Diagnosis not present

## 2012-04-22 DIAGNOSIS — R262 Difficulty in walking, not elsewhere classified: Secondary | ICD-10-CM | POA: Diagnosis not present

## 2012-04-23 DIAGNOSIS — M25569 Pain in unspecified knee: Secondary | ICD-10-CM | POA: Diagnosis not present

## 2012-04-23 DIAGNOSIS — R262 Difficulty in walking, not elsewhere classified: Secondary | ICD-10-CM | POA: Diagnosis not present

## 2012-04-23 DIAGNOSIS — I2510 Atherosclerotic heart disease of native coronary artery without angina pectoris: Secondary | ICD-10-CM

## 2012-04-23 DIAGNOSIS — M6281 Muscle weakness (generalized): Secondary | ICD-10-CM | POA: Diagnosis not present

## 2012-04-23 DIAGNOSIS — M48061 Spinal stenosis, lumbar region without neurogenic claudication: Secondary | ICD-10-CM

## 2012-04-23 DIAGNOSIS — IMO0002 Reserved for concepts with insufficient information to code with codable children: Secondary | ICD-10-CM

## 2012-04-23 DIAGNOSIS — J449 Chronic obstructive pulmonary disease, unspecified: Secondary | ICD-10-CM

## 2012-04-24 DIAGNOSIS — M25569 Pain in unspecified knee: Secondary | ICD-10-CM | POA: Diagnosis not present

## 2012-04-24 DIAGNOSIS — R262 Difficulty in walking, not elsewhere classified: Secondary | ICD-10-CM | POA: Diagnosis not present

## 2012-04-24 DIAGNOSIS — M6281 Muscle weakness (generalized): Secondary | ICD-10-CM | POA: Diagnosis not present

## 2012-04-25 DIAGNOSIS — M6281 Muscle weakness (generalized): Secondary | ICD-10-CM | POA: Diagnosis not present

## 2012-04-25 DIAGNOSIS — R262 Difficulty in walking, not elsewhere classified: Secondary | ICD-10-CM | POA: Diagnosis not present

## 2012-04-25 DIAGNOSIS — M25569 Pain in unspecified knee: Secondary | ICD-10-CM | POA: Diagnosis not present

## 2012-04-29 DIAGNOSIS — R262 Difficulty in walking, not elsewhere classified: Secondary | ICD-10-CM | POA: Diagnosis not present

## 2012-04-29 DIAGNOSIS — M25569 Pain in unspecified knee: Secondary | ICD-10-CM | POA: Diagnosis not present

## 2012-04-29 DIAGNOSIS — M6281 Muscle weakness (generalized): Secondary | ICD-10-CM | POA: Diagnosis not present

## 2012-04-30 DIAGNOSIS — M6281 Muscle weakness (generalized): Secondary | ICD-10-CM | POA: Diagnosis not present

## 2012-04-30 DIAGNOSIS — R262 Difficulty in walking, not elsewhere classified: Secondary | ICD-10-CM | POA: Diagnosis not present

## 2012-04-30 DIAGNOSIS — M25569 Pain in unspecified knee: Secondary | ICD-10-CM | POA: Diagnosis not present

## 2012-05-02 DIAGNOSIS — M6281 Muscle weakness (generalized): Secondary | ICD-10-CM | POA: Diagnosis not present

## 2012-05-02 DIAGNOSIS — M25569 Pain in unspecified knee: Secondary | ICD-10-CM | POA: Diagnosis not present

## 2012-05-02 DIAGNOSIS — R262 Difficulty in walking, not elsewhere classified: Secondary | ICD-10-CM | POA: Diagnosis not present

## 2012-05-03 DIAGNOSIS — M6281 Muscle weakness (generalized): Secondary | ICD-10-CM | POA: Diagnosis not present

## 2012-05-03 DIAGNOSIS — M25569 Pain in unspecified knee: Secondary | ICD-10-CM | POA: Diagnosis not present

## 2012-05-03 DIAGNOSIS — R262 Difficulty in walking, not elsewhere classified: Secondary | ICD-10-CM | POA: Diagnosis not present

## 2012-05-06 DIAGNOSIS — IMO0002 Reserved for concepts with insufficient information to code with codable children: Secondary | ICD-10-CM | POA: Diagnosis not present

## 2012-05-06 DIAGNOSIS — M76899 Other specified enthesopathies of unspecified lower limb, excluding foot: Secondary | ICD-10-CM | POA: Diagnosis not present

## 2012-05-06 DIAGNOSIS — M47817 Spondylosis without myelopathy or radiculopathy, lumbosacral region: Secondary | ICD-10-CM | POA: Diagnosis not present

## 2012-05-06 DIAGNOSIS — M5137 Other intervertebral disc degeneration, lumbosacral region: Secondary | ICD-10-CM | POA: Diagnosis not present

## 2012-05-07 DIAGNOSIS — M25569 Pain in unspecified knee: Secondary | ICD-10-CM | POA: Diagnosis not present

## 2012-05-07 DIAGNOSIS — R262 Difficulty in walking, not elsewhere classified: Secondary | ICD-10-CM | POA: Diagnosis not present

## 2012-05-07 DIAGNOSIS — M6281 Muscle weakness (generalized): Secondary | ICD-10-CM | POA: Diagnosis not present

## 2012-05-10 DIAGNOSIS — M6281 Muscle weakness (generalized): Secondary | ICD-10-CM | POA: Diagnosis not present

## 2012-05-10 DIAGNOSIS — R262 Difficulty in walking, not elsewhere classified: Secondary | ICD-10-CM | POA: Diagnosis not present

## 2012-05-10 DIAGNOSIS — M25569 Pain in unspecified knee: Secondary | ICD-10-CM | POA: Diagnosis not present

## 2012-05-12 DIAGNOSIS — M25569 Pain in unspecified knee: Secondary | ICD-10-CM | POA: Diagnosis not present

## 2012-05-12 DIAGNOSIS — R262 Difficulty in walking, not elsewhere classified: Secondary | ICD-10-CM | POA: Diagnosis not present

## 2012-05-12 DIAGNOSIS — M6281 Muscle weakness (generalized): Secondary | ICD-10-CM | POA: Diagnosis not present

## 2012-05-13 DIAGNOSIS — M549 Dorsalgia, unspecified: Secondary | ICD-10-CM | POA: Diagnosis not present

## 2012-05-13 DIAGNOSIS — M6281 Muscle weakness (generalized): Secondary | ICD-10-CM | POA: Diagnosis not present

## 2012-05-13 DIAGNOSIS — Z23 Encounter for immunization: Secondary | ICD-10-CM | POA: Diagnosis not present

## 2012-05-13 DIAGNOSIS — I10 Essential (primary) hypertension: Secondary | ICD-10-CM | POA: Diagnosis not present

## 2012-05-13 DIAGNOSIS — M25569 Pain in unspecified knee: Secondary | ICD-10-CM | POA: Diagnosis not present

## 2012-05-13 DIAGNOSIS — R262 Difficulty in walking, not elsewhere classified: Secondary | ICD-10-CM | POA: Diagnosis not present

## 2012-05-20 DIAGNOSIS — M47817 Spondylosis without myelopathy or radiculopathy, lumbosacral region: Secondary | ICD-10-CM | POA: Diagnosis not present

## 2012-05-20 DIAGNOSIS — IMO0002 Reserved for concepts with insufficient information to code with codable children: Secondary | ICD-10-CM | POA: Diagnosis not present

## 2012-05-20 DIAGNOSIS — M76899 Other specified enthesopathies of unspecified lower limb, excluding foot: Secondary | ICD-10-CM | POA: Diagnosis not present

## 2012-05-20 DIAGNOSIS — M48062 Spinal stenosis, lumbar region with neurogenic claudication: Secondary | ICD-10-CM | POA: Diagnosis not present

## 2012-05-28 DIAGNOSIS — J984 Other disorders of lung: Secondary | ICD-10-CM | POA: Diagnosis not present

## 2012-05-28 DIAGNOSIS — J449 Chronic obstructive pulmonary disease, unspecified: Secondary | ICD-10-CM | POA: Diagnosis not present

## 2012-06-07 DIAGNOSIS — M25559 Pain in unspecified hip: Secondary | ICD-10-CM | POA: Diagnosis not present

## 2012-06-07 DIAGNOSIS — R1031 Right lower quadrant pain: Secondary | ICD-10-CM | POA: Diagnosis not present

## 2012-06-07 LAB — URINALYSIS, COMPLETE
Bilirubin,UR: NEGATIVE
Blood: NEGATIVE
Glucose,UR: NEGATIVE mg/dL (ref 0–75)
Ketone: NEGATIVE
RBC,UR: 4 /HPF (ref 0–5)
Specific Gravity: 1.013 (ref 1.003–1.030)
Squamous Epithelial: 11
Transitional Epi: 1
WBC UR: 20 /HPF (ref 0–5)

## 2012-06-07 LAB — COMPREHENSIVE METABOLIC PANEL
Anion Gap: 9 (ref 7–16)
BUN: 29 mg/dL — ABNORMAL HIGH (ref 7–18)
Calcium, Total: 8.8 mg/dL (ref 8.5–10.1)
Chloride: 107 mmol/L (ref 98–107)
Creatinine: 1.15 mg/dL (ref 0.60–1.30)
EGFR (African American): 50 — ABNORMAL LOW
Osmolality: 282 (ref 275–301)
Potassium: 4.3 mmol/L (ref 3.5–5.1)
SGOT(AST): 24 U/L (ref 15–37)
Sodium: 138 mmol/L (ref 136–145)

## 2012-06-07 LAB — CBC
HCT: 34.5 % — ABNORMAL LOW (ref 35.0–47.0)
HGB: 11.3 g/dL — ABNORMAL LOW (ref 12.0–16.0)
Platelet: 303 10*3/uL (ref 150–440)
RDW: 16.1 % — ABNORMAL HIGH (ref 11.5–14.5)
WBC: 9.8 10*3/uL (ref 3.6–11.0)

## 2012-06-08 ENCOUNTER — Inpatient Hospital Stay: Payer: Self-pay | Admitting: Internal Medicine

## 2012-06-08 DIAGNOSIS — N390 Urinary tract infection, site not specified: Secondary | ICD-10-CM | POA: Diagnosis not present

## 2012-06-08 DIAGNOSIS — I10 Essential (primary) hypertension: Secondary | ICD-10-CM | POA: Diagnosis not present

## 2012-06-08 DIAGNOSIS — E785 Hyperlipidemia, unspecified: Secondary | ICD-10-CM | POA: Diagnosis not present

## 2012-06-08 DIAGNOSIS — Z800 Family history of malignant neoplasm of digestive organs: Secondary | ICD-10-CM | POA: Diagnosis not present

## 2012-06-08 DIAGNOSIS — Z66 Do not resuscitate: Secondary | ICD-10-CM | POA: Diagnosis present

## 2012-06-08 DIAGNOSIS — E860 Dehydration: Secondary | ICD-10-CM | POA: Diagnosis not present

## 2012-06-08 DIAGNOSIS — Z79899 Other long term (current) drug therapy: Secondary | ICD-10-CM | POA: Diagnosis not present

## 2012-06-08 DIAGNOSIS — M25559 Pain in unspecified hip: Secondary | ICD-10-CM | POA: Diagnosis not present

## 2012-06-08 DIAGNOSIS — M899 Disorder of bone, unspecified: Secondary | ICD-10-CM | POA: Diagnosis present

## 2012-06-08 DIAGNOSIS — Z951 Presence of aortocoronary bypass graft: Secondary | ICD-10-CM | POA: Diagnosis not present

## 2012-06-08 DIAGNOSIS — Z9089 Acquired absence of other organs: Secondary | ICD-10-CM | POA: Diagnosis not present

## 2012-06-08 DIAGNOSIS — Z882 Allergy status to sulfonamides status: Secondary | ICD-10-CM | POA: Diagnosis not present

## 2012-06-08 DIAGNOSIS — J45909 Unspecified asthma, uncomplicated: Secondary | ICD-10-CM | POA: Diagnosis present

## 2012-06-08 DIAGNOSIS — Z0389 Encounter for observation for other suspected diseases and conditions ruled out: Secondary | ICD-10-CM | POA: Diagnosis not present

## 2012-06-08 DIAGNOSIS — E039 Hypothyroidism, unspecified: Secondary | ICD-10-CM | POA: Diagnosis not present

## 2012-06-08 DIAGNOSIS — Z823 Family history of stroke: Secondary | ICD-10-CM | POA: Diagnosis not present

## 2012-06-08 DIAGNOSIS — H3530 Unspecified macular degeneration: Secondary | ICD-10-CM | POA: Diagnosis present

## 2012-06-08 DIAGNOSIS — M810 Age-related osteoporosis without current pathological fracture: Secondary | ICD-10-CM | POA: Diagnosis present

## 2012-06-08 DIAGNOSIS — N318 Other neuromuscular dysfunction of bladder: Secondary | ICD-10-CM | POA: Diagnosis present

## 2012-06-08 DIAGNOSIS — M79609 Pain in unspecified limb: Secondary | ICD-10-CM | POA: Diagnosis not present

## 2012-06-08 DIAGNOSIS — K219 Gastro-esophageal reflux disease without esophagitis: Secondary | ICD-10-CM | POA: Diagnosis present

## 2012-06-08 DIAGNOSIS — Z87891 Personal history of nicotine dependence: Secondary | ICD-10-CM | POA: Diagnosis not present

## 2012-06-08 DIAGNOSIS — M87059 Idiopathic aseptic necrosis of unspecified femur: Secondary | ICD-10-CM | POA: Diagnosis not present

## 2012-06-08 DIAGNOSIS — I2510 Atherosclerotic heart disease of native coronary artery without angina pectoris: Secondary | ICD-10-CM | POA: Diagnosis present

## 2012-06-08 DIAGNOSIS — K5900 Constipation, unspecified: Secondary | ICD-10-CM | POA: Diagnosis present

## 2012-06-08 DIAGNOSIS — Z7982 Long term (current) use of aspirin: Secondary | ICD-10-CM | POA: Diagnosis not present

## 2012-06-08 DIAGNOSIS — Z90710 Acquired absence of both cervix and uterus: Secondary | ICD-10-CM | POA: Diagnosis not present

## 2012-06-09 DIAGNOSIS — E860 Dehydration: Secondary | ICD-10-CM | POA: Diagnosis not present

## 2012-06-09 DIAGNOSIS — M87059 Idiopathic aseptic necrosis of unspecified femur: Secondary | ICD-10-CM | POA: Diagnosis not present

## 2012-06-09 DIAGNOSIS — M25559 Pain in unspecified hip: Secondary | ICD-10-CM | POA: Diagnosis not present

## 2012-06-09 DIAGNOSIS — E785 Hyperlipidemia, unspecified: Secondary | ICD-10-CM | POA: Diagnosis not present

## 2012-06-09 DIAGNOSIS — N390 Urinary tract infection, site not specified: Secondary | ICD-10-CM | POA: Diagnosis not present

## 2012-06-09 LAB — CBC WITH DIFFERENTIAL/PLATELET
Basophil %: 0.5 %
Eosinophil #: 0.3 10*3/uL (ref 0.0–0.7)
HGB: 11.2 g/dL — ABNORMAL LOW (ref 12.0–16.0)
Lymphocyte #: 1.3 10*3/uL (ref 1.0–3.6)
Lymphocyte %: 18.8 %
MCH: 30.1 pg (ref 26.0–34.0)
MCHC: 33.5 g/dL (ref 32.0–36.0)
MCV: 90 fL (ref 80–100)
Monocyte #: 0.4 x10 3/mm (ref 0.2–0.9)
Monocyte %: 6.3 %
Neutrophil #: 4.7 10*3/uL (ref 1.4–6.5)
Neutrophil %: 69.9 %
Platelet: 272 10*3/uL (ref 150–440)
RBC: 3.74 10*6/uL — ABNORMAL LOW (ref 3.80–5.20)
WBC: 6.7 10*3/uL (ref 3.6–11.0)

## 2012-06-09 LAB — BASIC METABOLIC PANEL
Anion Gap: 8 (ref 7–16)
BUN: 19 mg/dL — ABNORMAL HIGH (ref 7–18)
Creatinine: 0.94 mg/dL (ref 0.60–1.30)
EGFR (African American): >60
EGFR (Non-African Amer.): 55 — ABNORMAL LOW
Glucose: 96 mg/dL (ref 65–99)
Potassium: 4 mmol/L (ref 3.5–5.1)
Sodium: 139 mmol/L (ref 136–145)

## 2012-06-19 DIAGNOSIS — A0811 Acute gastroenteropathy due to Norwalk agent: Secondary | ICD-10-CM

## 2012-06-19 HISTORY — DX: Acute gastroenteropathy due to Norwalk agent: A08.11

## 2012-06-21 DIAGNOSIS — K589 Irritable bowel syndrome without diarrhea: Secondary | ICD-10-CM | POA: Diagnosis not present

## 2012-06-21 DIAGNOSIS — Z23 Encounter for immunization: Secondary | ICD-10-CM | POA: Diagnosis not present

## 2012-06-21 DIAGNOSIS — S7290XD Unspecified fracture of unspecified femur, subsequent encounter for closed fracture with routine healing: Secondary | ICD-10-CM | POA: Diagnosis not present

## 2012-06-21 DIAGNOSIS — M25559 Pain in unspecified hip: Secondary | ICD-10-CM | POA: Diagnosis not present

## 2012-06-21 DIAGNOSIS — M549 Dorsalgia, unspecified: Secondary | ICD-10-CM | POA: Diagnosis not present

## 2012-06-21 DIAGNOSIS — M4800 Spinal stenosis, site unspecified: Secondary | ICD-10-CM | POA: Diagnosis not present

## 2012-06-24 DIAGNOSIS — M76899 Other specified enthesopathies of unspecified lower limb, excluding foot: Secondary | ICD-10-CM | POA: Diagnosis not present

## 2012-06-24 DIAGNOSIS — IMO0002 Reserved for concepts with insufficient information to code with codable children: Secondary | ICD-10-CM | POA: Diagnosis not present

## 2012-06-24 DIAGNOSIS — M47817 Spondylosis without myelopathy or radiculopathy, lumbosacral region: Secondary | ICD-10-CM | POA: Diagnosis not present

## 2012-06-24 DIAGNOSIS — M48062 Spinal stenosis, lumbar region with neurogenic claudication: Secondary | ICD-10-CM | POA: Diagnosis not present

## 2012-07-03 ENCOUNTER — Ambulatory Visit: Payer: Medicare Other | Admitting: Internal Medicine

## 2012-07-05 DIAGNOSIS — M48062 Spinal stenosis, lumbar region with neurogenic claudication: Secondary | ICD-10-CM | POA: Diagnosis not present

## 2012-07-05 DIAGNOSIS — M47817 Spondylosis without myelopathy or radiculopathy, lumbosacral region: Secondary | ICD-10-CM | POA: Diagnosis not present

## 2012-07-05 DIAGNOSIS — IMO0002 Reserved for concepts with insufficient information to code with codable children: Secondary | ICD-10-CM | POA: Diagnosis not present

## 2012-07-15 DIAGNOSIS — M79609 Pain in unspecified limb: Secondary | ICD-10-CM | POA: Diagnosis not present

## 2012-07-15 DIAGNOSIS — B351 Tinea unguium: Secondary | ICD-10-CM | POA: Diagnosis not present

## 2012-07-15 DIAGNOSIS — L03119 Cellulitis of unspecified part of limb: Secondary | ICD-10-CM | POA: Diagnosis not present

## 2012-07-15 DIAGNOSIS — L02619 Cutaneous abscess of unspecified foot: Secondary | ICD-10-CM | POA: Diagnosis not present

## 2012-07-22 DIAGNOSIS — M47817 Spondylosis without myelopathy or radiculopathy, lumbosacral region: Secondary | ICD-10-CM | POA: Diagnosis not present

## 2012-07-22 DIAGNOSIS — M48062 Spinal stenosis, lumbar region with neurogenic claudication: Secondary | ICD-10-CM | POA: Diagnosis not present

## 2012-07-22 DIAGNOSIS — IMO0002 Reserved for concepts with insufficient information to code with codable children: Secondary | ICD-10-CM | POA: Diagnosis not present

## 2012-07-25 DIAGNOSIS — M87059 Idiopathic aseptic necrosis of unspecified femur: Secondary | ICD-10-CM | POA: Diagnosis not present

## 2012-07-30 DIAGNOSIS — K5289 Other specified noninfective gastroenteritis and colitis: Secondary | ICD-10-CM | POA: Diagnosis not present

## 2012-08-03 ENCOUNTER — Other Ambulatory Visit: Payer: Self-pay

## 2012-08-05 ENCOUNTER — Ambulatory Visit (INDEPENDENT_AMBULATORY_CARE_PROVIDER_SITE_OTHER): Payer: Medicare Other | Admitting: Cardiovascular Disease

## 2012-08-05 ENCOUNTER — Encounter: Payer: Self-pay | Admitting: Cardiovascular Disease

## 2012-08-05 VITALS — BP 127/67 | HR 70 | Ht 67.0 in | Wt 138.5 lb

## 2012-08-05 DIAGNOSIS — I25810 Atherosclerosis of coronary artery bypass graft(s) without angina pectoris: Secondary | ICD-10-CM | POA: Diagnosis not present

## 2012-08-05 DIAGNOSIS — R42 Dizziness and giddiness: Secondary | ICD-10-CM

## 2012-08-05 DIAGNOSIS — I10 Essential (primary) hypertension: Secondary | ICD-10-CM

## 2012-08-05 DIAGNOSIS — R0602 Shortness of breath: Secondary | ICD-10-CM | POA: Diagnosis not present

## 2012-08-05 DIAGNOSIS — I6529 Occlusion and stenosis of unspecified carotid artery: Secondary | ICD-10-CM

## 2012-08-05 DIAGNOSIS — E785 Hyperlipidemia, unspecified: Secondary | ICD-10-CM | POA: Diagnosis not present

## 2012-08-05 DIAGNOSIS — Z01810 Encounter for preprocedural cardiovascular examination: Secondary | ICD-10-CM

## 2012-08-05 NOTE — Assessment & Plan Note (Signed)
Rare episodes of dizziness. She will monitor her blood pressure when dizzy to make sure she is not orthostatic

## 2012-08-05 NOTE — Assessment & Plan Note (Signed)
Currently with no symptoms of angina. No further workup at this time. Continue current medication regimen. 

## 2012-08-05 NOTE — Assessment & Plan Note (Signed)
No recent lipid panel available. Would continue her statin. Previously was well controlled

## 2012-08-05 NOTE — Patient Instructions (Addendum)
You are doing well. No medication changes were made.  Please monitor your blood pressure. Call the office if you have low numbers  Please call us if you have new issues that need to be addressed before your next appt.  Your physician wants you to follow-up in: 6 months.  You will receive a reminder letter in the mail two months in advance. If you don't receive a letter, please call our office to schedule the follow-up appointment.

## 2012-08-05 NOTE — Assessment & Plan Note (Signed)
40-59% disease on the right

## 2012-08-05 NOTE — Progress Notes (Signed)
Patient ID: Chelsea [name], female    DOB: 10-Apr-1924, 79 y.o.   MRN: [id number]  HPI Comments: 79 year-old woman with CAD s/p CABG, diastolic dysfunction, carotid endarterectomy on the left 10 years ago, now with 40-59% carotid disease , mildly dilated left atrium, mild MR, presenting today for follow-up evaluation. she had dizziness in the past, h/o fall with right hip fracture, right arm fracture, 3 ribs injured. She had a long recovery in [place] where the injury occurred. She is now back at [facility]. Previous upper respiratory infection. 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 She has had significant pain, cortisone shots x3 to her hip now wearing a TENS unit for pain. She reports having moderate spinal stenosis.  Since we have last seen her, she has had several admissions to the hospital for abdominal pain. One was in late December, the other last week. She reports having a viral gastroenteritis last week and was in the hospital 3 days. Her weight is down 7 pounds from her prior clinic visit. She denies any significant chest pain, shortness of breath. Balance is stable but she uses a walker. Blood pressures labile, occasionally low. Occasional lightheadedness of uncertain if this is related to low blood pressure  She is  schedule for hip replacement surgery on the right on 09/02/2012  Previous echo in 05/2009 showed normal LV function without valvular abnormalities   stress test was negative for ischemia.   carotid ultrasound last year showed mild bilateral disease Cholesterol is well controlled with LDL less than 70   EKG shows normal sinus rhythm with a rate of 70 beats per minute, no significant ST or T wave changes     Outpatient Encounter Prescriptions as of 08/05/2012  Medication Sig Dispense Refill  . albuterol (PROVENTIL HFA;VENTOLIN HFA) 108 (90 BASE) MCG/ACT inhaler Inhale 2 puffs into the lungs every 6 (six) hours as needed.      Marland Kitchen amLODipine (NORVASC) 10 MG tablet Take  10 mg by mouth daily.      Marland Kitchen atorvastatin (LIPITOR) 80 MG tablet Take 1 tablet (80 mg total) by mouth daily.  90 tablet  4  . budesonide-formoterol (SYMBICORT) 80-4.5 MCG/ACT inhaler Inhale 2 puffs into the lungs 2 (two) times daily.        . diazepam (VALIUM) 5 MG tablet Take 5 mg by mouth every 6 (six) hours as needed.        . lansoprazole (PREVACID) 30 MG capsule Take 30 mg by mouth 2 (two) times daily.       Marland Kitchen levothyroxine (SYNTHROID, LEVOTHROID) 75 MCG tablet Take 75 mcg by mouth daily.        . Multiple Minerals-Vitamins (CALCIUM CITRATE +) TABS Take 1 tablet by mouth daily.        . Multiple Vitamins-Minerals (OCUVITE PRESERVISION PO) Take 1 capsule by mouth daily.        . nitroGLYCERIN (NITROSTAT) 0.4 MG SL tablet Place 0.4 mg under the tongue every 5 (five) minutes as needed.        Marland Kitchen oxyCODONE-acetaminophen (PERCOCET) 10-325 MG per tablet Take 1 tablet by mouth every 4 (four) hours as needed.      . Polyethylene Glycol 3350 (MIRALAX PO) Take by mouth daily.      . sertraline (ZOLOFT) 100 MG tablet Take 100 mg by mouth daily.      Marland Kitchen tiotropium (SPIRIVA) 18 MCG inhalation capsule Place 18 mcg into inhaler and inhale daily.        Marland Kitchen  tolterodine (DETROL LA) 4 MG 24 hr capsule Take 4 mg by mouth daily.        . [DISCONTINUED] aspirin 81 MG tablet Take two tablets daily.       . [DISCONTINUED] lidocaine (LIDODERM) 5 % Place 1 patch onto the skin daily. Remove & Discard patch within 12 hours or as directed by MD      . [DISCONTINUED] NYSTATIN PO Take by mouth 4 (four) times daily.       No facility-administered encounter medications on file as of 08/05/2012.     Review of Systems  HENT: Negative.   Eyes: Negative.   Respiratory: Negative.   Cardiovascular: Negative.   Gastrointestinal: Negative.   Musculoskeletal: Positive for gait problem.  Skin: Negative.   Neurological: Positive for weakness.  Psychiatric/Behavioral: Negative.   All other systems reviewed and are  negative.    BP 127/67  Pulse 70  Ht 5\' 7"  (1.702 m)  Wt 138 lb 8 oz (62.823 kg)  BMI 21.69 kg/m2  Physical Exam  Nursing note and vitals reviewed. Constitutional: She is oriented to person, place, and time. She appears well-developed and well-nourished.  HENT:  Head: Normocephalic.  Nose: Nose normal.  Mouth/Throat: Oropharynx is clear and moist.  Eyes: Conjunctivae are normal. Pupils are equal, round, and reactive to light.  Neck: Normal range of motion. Neck supple. No JVD present.  Cardiovascular: Normal rate, regular rhythm, S1 normal, S2 normal, normal heart sounds and intact distal pulses.  Exam reveals no gallop and no friction rub.   No murmur heard. Pulmonary/Chest: Effort normal and breath sounds normal. No respiratory distress. She has no wheezes. She has no rales. She exhibits no tenderness.  Abdominal: Soft. Bowel sounds are normal. She exhibits no distension. There is no tenderness.  Musculoskeletal: Normal range of motion. She exhibits no edema and no tenderness.  Lymphadenopathy:    She has no cervical adenopathy.  Neurological: She is alert and oriented to person, place, and time. Coordination normal.  Skin: Skin is warm and dry. No rash noted. No erythema.  Psychiatric: She has a normal mood and affect. Her behavior is normal. Judgment and thought content normal.    Assessment and Plan

## 2012-08-05 NOTE — Assessment & Plan Note (Signed)
Acceptable risk for upcoming replacement surgery by Dr. Ernest Pine. No further testing needed at this time. No changes to her medications.

## 2012-08-05 NOTE — Assessment & Plan Note (Signed)
Blood pressure adequate today. We have suggested that she closely monitor her blood pressure when she is dizzy. If blood pressure is low, we will change her amlodipine to 5 mg twice a day, possibly even once a day. She has had recent weight loss which may cause lower blood pressure

## 2012-08-13 DIAGNOSIS — M87059 Idiopathic aseptic necrosis of unspecified femur: Secondary | ICD-10-CM | POA: Diagnosis not present

## 2012-08-22 ENCOUNTER — Ambulatory Visit: Payer: Self-pay | Admitting: General Practice

## 2012-08-22 DIAGNOSIS — Z9889 Other specified postprocedural states: Secondary | ICD-10-CM | POA: Diagnosis not present

## 2012-08-22 DIAGNOSIS — Z79899 Other long term (current) drug therapy: Secondary | ICD-10-CM | POA: Diagnosis not present

## 2012-08-22 DIAGNOSIS — Z90710 Acquired absence of both cervix and uterus: Secondary | ICD-10-CM | POA: Diagnosis not present

## 2012-08-22 DIAGNOSIS — M169 Osteoarthritis of hip, unspecified: Secondary | ICD-10-CM | POA: Diagnosis not present

## 2012-08-22 DIAGNOSIS — M79609 Pain in unspecified limb: Secondary | ICD-10-CM | POA: Diagnosis not present

## 2012-08-22 DIAGNOSIS — R350 Frequency of micturition: Secondary | ICD-10-CM | POA: Diagnosis not present

## 2012-08-22 DIAGNOSIS — Z01812 Encounter for preprocedural laboratory examination: Secondary | ICD-10-CM | POA: Diagnosis not present

## 2012-08-22 LAB — BASIC METABOLIC PANEL
Creatinine: 1.04 mg/dL (ref 0.60–1.30)
EGFR (African American): 56 — ABNORMAL LOW
EGFR (Non-African Amer.): 49 — ABNORMAL LOW
Glucose: 106 mg/dL — ABNORMAL HIGH (ref 65–99)
Osmolality: 282 (ref 275–301)
Potassium: 4.5 mmol/L (ref 3.5–5.1)
Sodium: 139 mmol/L (ref 136–145)

## 2012-08-22 LAB — CBC
HCT: 34.5 % — ABNORMAL LOW (ref 35.0–47.0)
HGB: 11.4 g/dL — ABNORMAL LOW (ref 12.0–16.0)
MCH: 30.3 pg (ref 26.0–34.0)
RBC: 3.76 10*6/uL — ABNORMAL LOW (ref 3.80–5.20)

## 2012-08-22 LAB — PROTIME-INR
INR: 1
Prothrombin Time: 13.2 secs (ref 11.5–14.7)

## 2012-08-22 LAB — URINALYSIS, COMPLETE
Bilirubin,UR: NEGATIVE
Blood: NEGATIVE
Glucose,UR: NEGATIVE mg/dL (ref 0–75)
Hyaline Cast: 9
Ketone: NEGATIVE
Ph: 6 (ref 4.5–8.0)
Protein: NEGATIVE
RBC,UR: NONE SEEN /HPF (ref 0–5)
Specific Gravity: 1.011 (ref 1.003–1.030)
Squamous Epithelial: 4
Transitional Epi: 1
WBC UR: 1 /HPF (ref 0–5)

## 2012-08-22 LAB — SEDIMENTATION RATE: Erythrocyte Sed Rate: 25 mm/hr (ref 0–30)

## 2012-08-22 LAB — APTT: Activated PTT: 30.3 secs (ref 23.6–35.9)

## 2012-08-23 LAB — URINE CULTURE

## 2012-08-27 DIAGNOSIS — M87059 Idiopathic aseptic necrosis of unspecified femur: Secondary | ICD-10-CM | POA: Diagnosis not present

## 2012-09-02 ENCOUNTER — Inpatient Hospital Stay: Payer: Self-pay | Admitting: General Practice

## 2012-09-02 DIAGNOSIS — E7800 Pure hypercholesterolemia, unspecified: Secondary | ICD-10-CM | POA: Diagnosis not present

## 2012-09-02 DIAGNOSIS — Z66 Do not resuscitate: Secondary | ICD-10-CM | POA: Diagnosis present

## 2012-09-02 DIAGNOSIS — M6281 Muscle weakness (generalized): Secondary | ICD-10-CM | POA: Diagnosis not present

## 2012-09-02 DIAGNOSIS — M8700 Idiopathic aseptic necrosis of unspecified bone: Secondary | ICD-10-CM | POA: Diagnosis not present

## 2012-09-02 DIAGNOSIS — T84498A Other mechanical complication of other internal orthopedic devices, implants and grafts, initial encounter: Secondary | ICD-10-CM | POA: Diagnosis not present

## 2012-09-02 DIAGNOSIS — R6889 Other general symptoms and signs: Secondary | ICD-10-CM | POA: Diagnosis not present

## 2012-09-02 DIAGNOSIS — R262 Difficulty in walking, not elsewhere classified: Secondary | ICD-10-CM | POA: Diagnosis not present

## 2012-09-02 DIAGNOSIS — Z882 Allergy status to sulfonamides status: Secondary | ICD-10-CM | POA: Diagnosis not present

## 2012-09-02 DIAGNOSIS — K589 Irritable bowel syndrome without diarrhea: Secondary | ICD-10-CM | POA: Diagnosis not present

## 2012-09-02 DIAGNOSIS — Z8719 Personal history of other diseases of the digestive system: Secondary | ICD-10-CM | POA: Diagnosis not present

## 2012-09-02 DIAGNOSIS — R32 Unspecified urinary incontinence: Secondary | ICD-10-CM | POA: Diagnosis present

## 2012-09-02 DIAGNOSIS — I2510 Atherosclerotic heart disease of native coronary artery without angina pectoris: Secondary | ICD-10-CM | POA: Diagnosis present

## 2012-09-02 DIAGNOSIS — J449 Chronic obstructive pulmonary disease, unspecified: Secondary | ICD-10-CM | POA: Diagnosis present

## 2012-09-02 DIAGNOSIS — D62 Acute posthemorrhagic anemia: Secondary | ICD-10-CM | POA: Diagnosis not present

## 2012-09-02 DIAGNOSIS — Z471 Aftercare following joint replacement surgery: Secondary | ICD-10-CM | POA: Diagnosis not present

## 2012-09-02 DIAGNOSIS — K219 Gastro-esophageal reflux disease without esophagitis: Secondary | ICD-10-CM | POA: Diagnosis present

## 2012-09-02 DIAGNOSIS — E039 Hypothyroidism, unspecified: Secondary | ICD-10-CM | POA: Diagnosis not present

## 2012-09-02 DIAGNOSIS — Z79899 Other long term (current) drug therapy: Secondary | ICD-10-CM | POA: Diagnosis not present

## 2012-09-02 DIAGNOSIS — M167 Other unilateral secondary osteoarthritis of hip: Secondary | ICD-10-CM | POA: Diagnosis not present

## 2012-09-02 DIAGNOSIS — R42 Dizziness and giddiness: Secondary | ICD-10-CM | POA: Diagnosis not present

## 2012-09-02 DIAGNOSIS — H3530 Unspecified macular degeneration: Secondary | ICD-10-CM | POA: Diagnosis present

## 2012-09-02 DIAGNOSIS — F411 Generalized anxiety disorder: Secondary | ICD-10-CM | POA: Diagnosis present

## 2012-09-02 DIAGNOSIS — Z8781 Personal history of (healed) traumatic fracture: Secondary | ICD-10-CM | POA: Diagnosis not present

## 2012-09-02 DIAGNOSIS — Z96649 Presence of unspecified artificial hip joint: Secondary | ICD-10-CM | POA: Diagnosis not present

## 2012-09-02 DIAGNOSIS — M87059 Idiopathic aseptic necrosis of unspecified femur: Secondary | ICD-10-CM | POA: Diagnosis not present

## 2012-09-03 LAB — PLATELET COUNT: Platelet: 174 10*3/uL (ref 150–440)

## 2012-09-03 LAB — BASIC METABOLIC PANEL
Anion Gap: 6 — ABNORMAL LOW (ref 7–16)
Calcium, Total: 7.7 mg/dL — ABNORMAL LOW (ref 8.5–10.1)
Co2: 26 mmol/L (ref 21–32)
Creatinine: 1.07 mg/dL (ref 0.60–1.30)
EGFR (Non-African Amer.): 47 — ABNORMAL LOW
Glucose: 144 mg/dL — ABNORMAL HIGH (ref 65–99)
Osmolality: 279 (ref 275–301)
Potassium: 4.3 mmol/L (ref 3.5–5.1)
Sodium: 136 mmol/L (ref 136–145)

## 2012-09-03 LAB — HEMOGLOBIN
HGB: 6.6 g/dL — ABNORMAL LOW (ref 12.0–16.0)
HGB: 6.8 g/dL — ABNORMAL LOW (ref 12.0–16.0)

## 2012-09-04 LAB — BASIC METABOLIC PANEL
Anion Gap: 9 (ref 7–16)
BUN: 15 mg/dL (ref 7–18)
Calcium, Total: 7.9 mg/dL — ABNORMAL LOW (ref 8.5–10.1)
Chloride: 103 mmol/L (ref 98–107)
Co2: 22 mmol/L (ref 21–32)
Osmolality: 270 (ref 275–301)
Sodium: 134 mmol/L — ABNORMAL LOW (ref 136–145)

## 2012-09-04 LAB — PLATELET COUNT: Platelet: 157 10*3/uL (ref 150–440)

## 2012-09-04 LAB — HEMOGLOBIN: HGB: 7.7 g/dL — ABNORMAL LOW (ref 12.0–16.0)

## 2012-09-05 LAB — HEMOGLOBIN: HGB: 7.9 g/dL — ABNORMAL LOW (ref 12.0–16.0)

## 2012-09-05 LAB — BASIC METABOLIC PANEL
Chloride: 105 mmol/L (ref 98–107)
EGFR (Non-African Amer.): >60
Glucose: 116 mg/dL — ABNORMAL HIGH (ref 65–99)
Osmolality: 276 (ref 275–301)

## 2012-09-05 LAB — PATHOLOGY REPORT

## 2012-09-06 DIAGNOSIS — K219 Gastro-esophageal reflux disease without esophagitis: Secondary | ICD-10-CM | POA: Diagnosis not present

## 2012-09-06 DIAGNOSIS — K589 Irritable bowel syndrome without diarrhea: Secondary | ICD-10-CM | POA: Diagnosis not present

## 2012-09-06 DIAGNOSIS — J449 Chronic obstructive pulmonary disease, unspecified: Secondary | ICD-10-CM | POA: Diagnosis not present

## 2012-09-06 DIAGNOSIS — L820 Inflamed seborrheic keratosis: Secondary | ICD-10-CM | POA: Diagnosis not present

## 2012-09-06 DIAGNOSIS — R6889 Other general symptoms and signs: Secondary | ICD-10-CM | POA: Diagnosis not present

## 2012-09-06 DIAGNOSIS — I2510 Atherosclerotic heart disease of native coronary artery without angina pectoris: Secondary | ICD-10-CM | POA: Diagnosis not present

## 2012-09-06 DIAGNOSIS — M8700 Idiopathic aseptic necrosis of unspecified bone: Secondary | ICD-10-CM | POA: Diagnosis not present

## 2012-09-06 DIAGNOSIS — R262 Difficulty in walking, not elsewhere classified: Secondary | ICD-10-CM | POA: Diagnosis not present

## 2012-09-06 DIAGNOSIS — R351 Nocturia: Secondary | ICD-10-CM | POA: Diagnosis not present

## 2012-09-06 DIAGNOSIS — M79609 Pain in unspecified limb: Secondary | ICD-10-CM | POA: Diagnosis not present

## 2012-09-06 DIAGNOSIS — L570 Actinic keratosis: Secondary | ICD-10-CM | POA: Diagnosis not present

## 2012-09-06 DIAGNOSIS — J209 Acute bronchitis, unspecified: Secondary | ICD-10-CM | POA: Diagnosis not present

## 2012-09-06 DIAGNOSIS — H3530 Unspecified macular degeneration: Secondary | ICD-10-CM | POA: Diagnosis not present

## 2012-09-06 DIAGNOSIS — Z471 Aftercare following joint replacement surgery: Secondary | ICD-10-CM | POA: Diagnosis not present

## 2012-09-06 DIAGNOSIS — F411 Generalized anxiety disorder: Secondary | ICD-10-CM | POA: Diagnosis not present

## 2012-09-06 DIAGNOSIS — M6281 Muscle weakness (generalized): Secondary | ICD-10-CM | POA: Diagnosis not present

## 2012-09-06 DIAGNOSIS — E039 Hypothyroidism, unspecified: Secondary | ICD-10-CM | POA: Diagnosis not present

## 2012-09-06 DIAGNOSIS — B351 Tinea unguium: Secondary | ICD-10-CM | POA: Diagnosis not present

## 2012-09-06 DIAGNOSIS — L821 Other seborrheic keratosis: Secondary | ICD-10-CM | POA: Diagnosis not present

## 2012-09-06 DIAGNOSIS — L219 Seborrheic dermatitis, unspecified: Secondary | ICD-10-CM | POA: Diagnosis not present

## 2012-09-06 DIAGNOSIS — E7800 Pure hypercholesterolemia, unspecified: Secondary | ICD-10-CM | POA: Diagnosis not present

## 2012-09-06 DIAGNOSIS — Z96649 Presence of unspecified artificial hip joint: Secondary | ICD-10-CM | POA: Diagnosis not present

## 2012-09-06 LAB — HEMOGLOBIN: HGB: 9.7 g/dL — ABNORMAL LOW (ref 12.0–16.0)

## 2012-09-10 DIAGNOSIS — F411 Generalized anxiety disorder: Secondary | ICD-10-CM

## 2012-09-10 DIAGNOSIS — J449 Chronic obstructive pulmonary disease, unspecified: Secondary | ICD-10-CM

## 2012-09-10 DIAGNOSIS — I2510 Atherosclerotic heart disease of native coronary artery without angina pectoris: Secondary | ICD-10-CM

## 2012-09-10 DIAGNOSIS — M8700 Idiopathic aseptic necrosis of unspecified bone: Secondary | ICD-10-CM | POA: Diagnosis not present

## 2012-09-16 DIAGNOSIS — M79609 Pain in unspecified limb: Secondary | ICD-10-CM | POA: Diagnosis not present

## 2012-09-16 DIAGNOSIS — B351 Tinea unguium: Secondary | ICD-10-CM | POA: Diagnosis not present

## 2012-09-19 DIAGNOSIS — J449 Chronic obstructive pulmonary disease, unspecified: Secondary | ICD-10-CM

## 2012-09-19 DIAGNOSIS — M8700 Idiopathic aseptic necrosis of unspecified bone: Secondary | ICD-10-CM

## 2012-09-19 DIAGNOSIS — R351 Nocturia: Secondary | ICD-10-CM

## 2012-09-19 DIAGNOSIS — J4489 Other specified chronic obstructive pulmonary disease: Secondary | ICD-10-CM

## 2012-09-19 DIAGNOSIS — F411 Generalized anxiety disorder: Secondary | ICD-10-CM

## 2012-10-07 DIAGNOSIS — J209 Acute bronchitis, unspecified: Secondary | ICD-10-CM

## 2012-10-15 DIAGNOSIS — Z96649 Presence of unspecified artificial hip joint: Secondary | ICD-10-CM | POA: Diagnosis not present

## 2012-10-16 DIAGNOSIS — L820 Inflamed seborrheic keratosis: Secondary | ICD-10-CM | POA: Diagnosis not present

## 2012-10-16 DIAGNOSIS — L570 Actinic keratosis: Secondary | ICD-10-CM | POA: Diagnosis not present

## 2012-10-16 DIAGNOSIS — L821 Other seborrheic keratosis: Secondary | ICD-10-CM | POA: Diagnosis not present

## 2012-10-16 DIAGNOSIS — L219 Seborrheic dermatitis, unspecified: Secondary | ICD-10-CM | POA: Diagnosis not present

## 2012-10-21 DIAGNOSIS — R262 Difficulty in walking, not elsewhere classified: Secondary | ICD-10-CM | POA: Diagnosis not present

## 2012-10-21 DIAGNOSIS — M6281 Muscle weakness (generalized): Secondary | ICD-10-CM | POA: Diagnosis not present

## 2012-10-23 DIAGNOSIS — R262 Difficulty in walking, not elsewhere classified: Secondary | ICD-10-CM | POA: Diagnosis not present

## 2012-10-23 DIAGNOSIS — M6281 Muscle weakness (generalized): Secondary | ICD-10-CM | POA: Diagnosis not present

## 2012-10-24 DIAGNOSIS — M6281 Muscle weakness (generalized): Secondary | ICD-10-CM | POA: Diagnosis not present

## 2012-10-24 DIAGNOSIS — R262 Difficulty in walking, not elsewhere classified: Secondary | ICD-10-CM | POA: Diagnosis not present

## 2012-10-25 DIAGNOSIS — M6281 Muscle weakness (generalized): Secondary | ICD-10-CM | POA: Diagnosis not present

## 2012-10-25 DIAGNOSIS — R262 Difficulty in walking, not elsewhere classified: Secondary | ICD-10-CM | POA: Diagnosis not present

## 2012-10-28 DIAGNOSIS — R262 Difficulty in walking, not elsewhere classified: Secondary | ICD-10-CM | POA: Diagnosis not present

## 2012-10-28 DIAGNOSIS — M6281 Muscle weakness (generalized): Secondary | ICD-10-CM | POA: Diagnosis not present

## 2012-10-29 DIAGNOSIS — M6281 Muscle weakness (generalized): Secondary | ICD-10-CM | POA: Diagnosis not present

## 2012-10-29 DIAGNOSIS — R262 Difficulty in walking, not elsewhere classified: Secondary | ICD-10-CM | POA: Diagnosis not present

## 2012-10-30 DIAGNOSIS — IMO0002 Reserved for concepts with insufficient information to code with codable children: Secondary | ICD-10-CM | POA: Diagnosis not present

## 2012-10-30 DIAGNOSIS — M545 Low back pain: Secondary | ICD-10-CM | POA: Diagnosis not present

## 2012-10-31 DIAGNOSIS — M6281 Muscle weakness (generalized): Secondary | ICD-10-CM | POA: Diagnosis not present

## 2012-10-31 DIAGNOSIS — R262 Difficulty in walking, not elsewhere classified: Secondary | ICD-10-CM | POA: Diagnosis not present

## 2012-11-04 DIAGNOSIS — M549 Dorsalgia, unspecified: Secondary | ICD-10-CM | POA: Diagnosis not present

## 2012-11-04 DIAGNOSIS — M6281 Muscle weakness (generalized): Secondary | ICD-10-CM | POA: Diagnosis not present

## 2012-11-04 DIAGNOSIS — R262 Difficulty in walking, not elsewhere classified: Secondary | ICD-10-CM | POA: Diagnosis not present

## 2012-11-04 DIAGNOSIS — J449 Chronic obstructive pulmonary disease, unspecified: Secondary | ICD-10-CM | POA: Diagnosis not present

## 2012-11-04 DIAGNOSIS — Z23 Encounter for immunization: Secondary | ICD-10-CM | POA: Diagnosis not present

## 2012-11-04 DIAGNOSIS — I10 Essential (primary) hypertension: Secondary | ICD-10-CM | POA: Diagnosis not present

## 2012-11-05 DIAGNOSIS — R262 Difficulty in walking, not elsewhere classified: Secondary | ICD-10-CM | POA: Diagnosis not present

## 2012-11-05 DIAGNOSIS — M6281 Muscle weakness (generalized): Secondary | ICD-10-CM | POA: Diagnosis not present

## 2012-11-07 DIAGNOSIS — M6281 Muscle weakness (generalized): Secondary | ICD-10-CM | POA: Diagnosis not present

## 2012-11-07 DIAGNOSIS — R262 Difficulty in walking, not elsewhere classified: Secondary | ICD-10-CM | POA: Diagnosis not present

## 2012-11-13 DIAGNOSIS — M48062 Spinal stenosis, lumbar region with neurogenic claudication: Secondary | ICD-10-CM | POA: Diagnosis not present

## 2012-11-13 DIAGNOSIS — IMO0002 Reserved for concepts with insufficient information to code with codable children: Secondary | ICD-10-CM | POA: Diagnosis not present

## 2012-11-13 DIAGNOSIS — M62838 Other muscle spasm: Secondary | ICD-10-CM | POA: Diagnosis not present

## 2012-11-18 DIAGNOSIS — R262 Difficulty in walking, not elsewhere classified: Secondary | ICD-10-CM | POA: Diagnosis not present

## 2012-11-18 DIAGNOSIS — M545 Low back pain: Secondary | ICD-10-CM | POA: Diagnosis not present

## 2012-11-20 DIAGNOSIS — R262 Difficulty in walking, not elsewhere classified: Secondary | ICD-10-CM | POA: Diagnosis not present

## 2012-11-20 DIAGNOSIS — M545 Low back pain: Secondary | ICD-10-CM | POA: Diagnosis not present

## 2012-11-21 DIAGNOSIS — R262 Difficulty in walking, not elsewhere classified: Secondary | ICD-10-CM | POA: Diagnosis not present

## 2012-11-21 DIAGNOSIS — M545 Low back pain: Secondary | ICD-10-CM | POA: Diagnosis not present

## 2012-11-22 DIAGNOSIS — M545 Low back pain: Secondary | ICD-10-CM | POA: Diagnosis not present

## 2012-11-22 DIAGNOSIS — R262 Difficulty in walking, not elsewhere classified: Secondary | ICD-10-CM | POA: Diagnosis not present

## 2012-11-25 DIAGNOSIS — R262 Difficulty in walking, not elsewhere classified: Secondary | ICD-10-CM | POA: Diagnosis not present

## 2012-11-25 DIAGNOSIS — M545 Low back pain: Secondary | ICD-10-CM | POA: Diagnosis not present

## 2012-11-26 DIAGNOSIS — R262 Difficulty in walking, not elsewhere classified: Secondary | ICD-10-CM | POA: Diagnosis not present

## 2012-11-26 DIAGNOSIS — M545 Low back pain: Secondary | ICD-10-CM | POA: Diagnosis not present

## 2012-11-28 DIAGNOSIS — R05 Cough: Secondary | ICD-10-CM | POA: Diagnosis not present

## 2012-11-28 DIAGNOSIS — J449 Chronic obstructive pulmonary disease, unspecified: Secondary | ICD-10-CM | POA: Diagnosis not present

## 2012-11-29 DIAGNOSIS — M545 Low back pain: Secondary | ICD-10-CM | POA: Diagnosis not present

## 2012-11-29 DIAGNOSIS — IMO0002 Reserved for concepts with insufficient information to code with codable children: Secondary | ICD-10-CM | POA: Diagnosis not present

## 2012-11-29 DIAGNOSIS — M5137 Other intervertebral disc degeneration, lumbosacral region: Secondary | ICD-10-CM | POA: Diagnosis not present

## 2012-11-29 DIAGNOSIS — M48062 Spinal stenosis, lumbar region with neurogenic claudication: Secondary | ICD-10-CM | POA: Diagnosis not present

## 2012-11-29 DIAGNOSIS — R262 Difficulty in walking, not elsewhere classified: Secondary | ICD-10-CM | POA: Diagnosis not present

## 2012-12-03 DIAGNOSIS — R262 Difficulty in walking, not elsewhere classified: Secondary | ICD-10-CM | POA: Diagnosis not present

## 2012-12-03 DIAGNOSIS — M545 Low back pain: Secondary | ICD-10-CM | POA: Diagnosis not present

## 2012-12-05 DIAGNOSIS — R262 Difficulty in walking, not elsewhere classified: Secondary | ICD-10-CM | POA: Diagnosis not present

## 2012-12-05 DIAGNOSIS — M545 Low back pain: Secondary | ICD-10-CM | POA: Diagnosis not present

## 2012-12-06 DIAGNOSIS — R262 Difficulty in walking, not elsewhere classified: Secondary | ICD-10-CM | POA: Diagnosis not present

## 2012-12-06 DIAGNOSIS — M545 Low back pain: Secondary | ICD-10-CM | POA: Diagnosis not present

## 2012-12-11 DIAGNOSIS — M545 Low back pain: Secondary | ICD-10-CM | POA: Diagnosis not present

## 2012-12-11 DIAGNOSIS — R262 Difficulty in walking, not elsewhere classified: Secondary | ICD-10-CM | POA: Diagnosis not present

## 2012-12-23 DIAGNOSIS — R262 Difficulty in walking, not elsewhere classified: Secondary | ICD-10-CM | POA: Diagnosis not present

## 2012-12-23 DIAGNOSIS — M545 Low back pain: Secondary | ICD-10-CM | POA: Diagnosis not present

## 2012-12-30 DIAGNOSIS — B351 Tinea unguium: Secondary | ICD-10-CM | POA: Diagnosis not present

## 2012-12-30 DIAGNOSIS — M79609 Pain in unspecified limb: Secondary | ICD-10-CM | POA: Diagnosis not present

## 2013-01-02 ENCOUNTER — Ambulatory Visit: Payer: Self-pay | Admitting: Specialist

## 2013-01-02 DIAGNOSIS — R918 Other nonspecific abnormal finding of lung field: Secondary | ICD-10-CM | POA: Diagnosis not present

## 2013-01-02 DIAGNOSIS — J984 Other disorders of lung: Secondary | ICD-10-CM | POA: Diagnosis not present

## 2013-01-03 DIAGNOSIS — IMO0002 Reserved for concepts with insufficient information to code with codable children: Secondary | ICD-10-CM | POA: Diagnosis not present

## 2013-01-03 DIAGNOSIS — M48062 Spinal stenosis, lumbar region with neurogenic claudication: Secondary | ICD-10-CM | POA: Diagnosis not present

## 2013-01-03 DIAGNOSIS — M5137 Other intervertebral disc degeneration, lumbosacral region: Secondary | ICD-10-CM | POA: Diagnosis not present

## 2013-01-08 DIAGNOSIS — M47817 Spondylosis without myelopathy or radiculopathy, lumbosacral region: Secondary | ICD-10-CM | POA: Diagnosis not present

## 2013-01-08 DIAGNOSIS — IMO0002 Reserved for concepts with insufficient information to code with codable children: Secondary | ICD-10-CM | POA: Diagnosis not present

## 2013-01-08 DIAGNOSIS — M48062 Spinal stenosis, lumbar region with neurogenic claudication: Secondary | ICD-10-CM | POA: Diagnosis not present

## 2013-01-15 DIAGNOSIS — L570 Actinic keratosis: Secondary | ICD-10-CM | POA: Diagnosis not present

## 2013-01-15 DIAGNOSIS — L219 Seborrheic dermatitis, unspecified: Secondary | ICD-10-CM | POA: Diagnosis not present

## 2013-01-22 ENCOUNTER — Other Ambulatory Visit: Payer: Self-pay

## 2013-01-29 DIAGNOSIS — I10 Essential (primary) hypertension: Secondary | ICD-10-CM | POA: Diagnosis not present

## 2013-01-29 DIAGNOSIS — J029 Acute pharyngitis, unspecified: Secondary | ICD-10-CM | POA: Diagnosis not present

## 2013-01-29 DIAGNOSIS — M549 Dorsalgia, unspecified: Secondary | ICD-10-CM | POA: Diagnosis not present

## 2013-01-29 DIAGNOSIS — E7800 Pure hypercholesterolemia, unspecified: Secondary | ICD-10-CM | POA: Diagnosis not present

## 2013-01-31 ENCOUNTER — Other Ambulatory Visit: Payer: Self-pay

## 2013-02-04 ENCOUNTER — Ambulatory Visit: Payer: Medicare Other | Admitting: Cardiovascular Disease

## 2013-02-10 ENCOUNTER — Ambulatory Visit: Payer: Self-pay | Admitting: Family Medicine

## 2013-02-10 DIAGNOSIS — J029 Acute pharyngitis, unspecified: Secondary | ICD-10-CM | POA: Diagnosis not present

## 2013-02-10 DIAGNOSIS — I10 Essential (primary) hypertension: Secondary | ICD-10-CM | POA: Diagnosis not present

## 2013-02-10 DIAGNOSIS — M549 Dorsalgia, unspecified: Secondary | ICD-10-CM | POA: Diagnosis not present

## 2013-02-10 DIAGNOSIS — R109 Unspecified abdominal pain: Secondary | ICD-10-CM | POA: Diagnosis not present

## 2013-02-20 ENCOUNTER — Encounter: Payer: Self-pay | Admitting: Cardiovascular Disease

## 2013-02-20 ENCOUNTER — Ambulatory Visit (INDEPENDENT_AMBULATORY_CARE_PROVIDER_SITE_OTHER): Payer: Medicare Other | Admitting: Cardiovascular Disease

## 2013-02-20 VITALS — BP 130/60 | HR 66 | Ht 67.0 in | Wt 131.0 lb

## 2013-02-20 DIAGNOSIS — E785 Hyperlipidemia, unspecified: Secondary | ICD-10-CM | POA: Diagnosis not present

## 2013-02-20 DIAGNOSIS — I25810 Atherosclerosis of coronary artery bypass graft(s) without angina pectoris: Secondary | ICD-10-CM

## 2013-02-20 DIAGNOSIS — I6529 Occlusion and stenosis of unspecified carotid artery: Secondary | ICD-10-CM

## 2013-02-20 DIAGNOSIS — I10 Essential (primary) hypertension: Secondary | ICD-10-CM

## 2013-02-20 NOTE — Progress Notes (Signed)
Patient ID: Chelsea [name], female    DOB: [birth year], 79 y.o.   MRN: [id number]  HPI Comments: 79 year-old woman with CAD s/p CABG, diastolic dysfunction, carotid endarterectomy on the left 10 years ago, now with 40-59% carotid disease , mildly dilated left atrium, mild MR, presenting today for follow-up evaluation.   dizziness in the past, h/o fall with right hip fracture, right arm fracture, 3 ribs injured. She had a long recovery in [place] where the injury occurred. She is now back at [facility]. Previous upper respiratory infection. 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 She has had significant pain, cortisone shots x3 to her hip now wearing a TENS unit for pain. She reports having moderate spinal stenosis.  several admissions to the hospital for abdominal pain. One was in late December 2013, any in February 2014 . Her weight continues to drop over the past year. Weight was 147 pounds September 2013, down to 138 pounds in February 2014, now down to 131 pounds. She reports that she lost weight after her hip surgery in March 2014. She had a complicated recovery with bronchitis requiring long period of rehabilitation. She no longer participates in physical therapy. She walks with a cane. Continues to have rare abdominal pain. She did take laxatives for a short period of time, now has frequent bowel movements without laxatives.   Previous echo in 05/2009 showed normal LV function without valvular abnormalities   stress test was negative for ischemia.   carotid ultrasound last year showed mild bilateral disease   EKG shows normal sinus rhythm with a rate of 66 beats per minute, no significant ST or T wave changes     Outpatient Encounter Prescriptions as of 02/20/2013  Medication Sig Dispense Refill  . albuterol (PROVENTIL HFA;VENTOLIN HFA) 108 (90 BASE) MCG/ACT inhaler Inhale 2 puffs into the lungs every 6 (six) hours as needed.      Marland Kitchen amLODipine (NORVASC) 10 MG tablet Take 10 mg by  mouth daily.      Marland Kitchen atorvastatin (LIPITOR) 80 MG tablet Take 1 tablet (80 mg total) by mouth daily.  90 tablet  4  . budesonide-formoterol (SYMBICORT) 80-4.5 MCG/ACT inhaler Inhale 2 puffs into the lungs 2 (two) times daily.        . diazepam (VALIUM) 5 MG tablet Take 5 mg by mouth every 6 (six) hours as needed.        . lansoprazole (PREVACID) 30 MG capsule Take 30 mg by mouth 2 (two) times daily.       Marland Kitchen levothyroxine (SYNTHROID, LEVOTHROID) 75 MCG tablet Take 75 mcg by mouth daily.        Marland Kitchen LORazepam (ATIVAN) 0.5 MG tablet Take 0.5 mg by mouth as needed.       . Multiple Minerals-Vitamins (CALCIUM CITRATE +) TABS Take 1 tablet by mouth daily.        . Multiple Vitamins-Minerals (OCUVITE PRESERVISION PO) Take 1 capsule by mouth 2 (two) times daily.       . nitroGLYCERIN (NITROSTAT) 0.4 MG SL tablet Place 0.4 mg under the tongue every 5 (five) minutes as needed.        Marland Kitchen oxyCODONE-acetaminophen (PERCOCET) 10-325 MG per tablet Take 1 tablet by mouth every 4 (four) hours as needed.      . Polyethylene Glycol 3350 (MIRALAX PO) Take by mouth daily.      . sertraline (ZOLOFT) 100 MG tablet Take 150 mg by mouth daily.       Marland Kitchen  tiotropium (SPIRIVA) 18 MCG inhalation capsule Place 18 mcg into inhaler and inhale daily.        Marland Kitchen tolterodine (DETROL LA) 4 MG 24 hr capsule Take 4 mg by mouth daily.         No facility-administered encounter medications on file as of 02/20/2013.     Review of Systems  HENT: Negative.   Eyes: Negative.   Respiratory: Negative.   Cardiovascular: Negative.   Gastrointestinal: Negative.   Musculoskeletal: Positive for gait problem.  Skin: Negative.   Neurological: Positive for weakness.  Psychiatric/Behavioral: Negative.   All other systems reviewed and are negative.    BP 130/60  Pulse 66  Ht 5\' 7"  (1.702 m)  Wt 131 lb (59.421 kg)  BMI 20.51 kg/m2  Physical Exam  Nursing note and vitals reviewed. Constitutional: She is oriented to person, place, and time. She  appears well-developed and well-nourished.  HENT:  Head: Normocephalic.  Nose: Nose normal.  Mouth/Throat: Oropharynx is clear and moist.  Eyes: Conjunctivae are normal. Pupils are equal, round, and reactive to light.  Neck: Normal range of motion. Neck supple. No JVD present.  Cardiovascular: Normal rate, regular rhythm, S1 normal, S2 normal, normal heart sounds and intact distal pulses.  Exam reveals no gallop and no friction rub.   No murmur heard. Pulmonary/Chest: Effort normal and breath sounds normal. No respiratory distress. She has no wheezes. She has no rales. She exhibits no tenderness.  Abdominal: Soft. Bowel sounds are normal. She exhibits no distension. There is no tenderness.  Musculoskeletal: Normal range of motion. She exhibits no edema and no tenderness.  Lymphadenopathy:    She has no cervical adenopathy.  Neurological: She is alert and oriented to person, place, and time. Coordination normal.  Skin: Skin is warm and dry. No rash noted. No erythema.  Psychiatric: She has a normal mood and affect. Her behavior is normal. Judgment and thought content normal.    Assessment and Plan

## 2013-02-20 NOTE — Patient Instructions (Addendum)
You are doing well. No medication changes were made.  Please have fasting labs done anytime for cholesterol  Please call us if you have new issues that need to be addressed before your next appt.  Your physician wants you to follow-up in: 6 months.  You will receive a reminder letter in the mail two months in advance. If you don't receive a letter, please call our office to schedule the follow-up appointment.

## 2013-02-20 NOTE — Assessment & Plan Note (Signed)
We'll repeat cholesterol panel. If well below goal, we'll decrease her Lipitor in half.

## 2013-02-20 NOTE — Assessment & Plan Note (Signed)
Moderate carotid disease in the past. Repeat carotid ultrasound scheduled later this month.

## 2013-02-20 NOTE — Assessment & Plan Note (Signed)
Blood pressure is well controlled on today's visit. No changes made to the medications. We'll need to monitor her blood pressure with recent weight loss. May need to decrease medications for any low blood pressures.

## 2013-02-20 NOTE — Assessment & Plan Note (Signed)
Currently with no symptoms of angina. No further workup at this time. Continue current medication regimen. 

## 2013-02-25 DIAGNOSIS — I25810 Atherosclerosis of coronary artery bypass graft(s) without angina pectoris: Secondary | ICD-10-CM | POA: Diagnosis not present

## 2013-02-25 DIAGNOSIS — E782 Mixed hyperlipidemia: Secondary | ICD-10-CM | POA: Diagnosis not present

## 2013-02-27 ENCOUNTER — Telehealth: Payer: Self-pay

## 2013-02-27 NOTE — Telephone Encounter (Signed)
Message copied by Marilynne Halsted on Thu Feb 27, 2013 11:47 AM ------      Message from: Antonieta Iba      Created: Wed Feb 26, 2013  8:09 PM       Cholesterol good,      Stay on current meds ------

## 2013-02-27 NOTE — Telephone Encounter (Signed)
Pt is aware of results. 

## 2013-02-28 ENCOUNTER — Other Ambulatory Visit: Payer: Self-pay | Admitting: Internal Medicine

## 2013-02-28 NOTE — Telephone Encounter (Signed)
Refill denied 

## 2013-02-28 NOTE — Telephone Encounter (Signed)
Just from rehab Should go to PCP or to Dr Mariah Milling who sees her

## 2013-02-28 NOTE — Telephone Encounter (Signed)
Is this your patient?

## 2013-03-19 DIAGNOSIS — M48062 Spinal stenosis, lumbar region with neurogenic claudication: Secondary | ICD-10-CM | POA: Diagnosis not present

## 2013-03-19 DIAGNOSIS — IMO0002 Reserved for concepts with insufficient information to code with codable children: Secondary | ICD-10-CM | POA: Diagnosis not present

## 2013-03-19 DIAGNOSIS — M5137 Other intervertebral disc degeneration, lumbosacral region: Secondary | ICD-10-CM | POA: Diagnosis not present

## 2013-03-31 ENCOUNTER — Encounter: Payer: Self-pay | Admitting: Podiatry

## 2013-03-31 ENCOUNTER — Ambulatory Visit (INDEPENDENT_AMBULATORY_CARE_PROVIDER_SITE_OTHER): Payer: Medicare Other | Admitting: Podiatry

## 2013-03-31 VITALS — BP 122/74 | HR 72 | Resp 14 | Ht 66.0 in | Wt 130.0 lb

## 2013-03-31 DIAGNOSIS — B351 Tinea unguium: Secondary | ICD-10-CM | POA: Diagnosis not present

## 2013-03-31 DIAGNOSIS — M79609 Pain in unspecified limb: Secondary | ICD-10-CM | POA: Insufficient documentation

## 2013-03-31 NOTE — Progress Notes (Signed)
Derriana presents today with a chief complaint of painful elongated toenails bilaterally.  Objective: Pulses are palpable strong bilateral. Nails are elongated thickened dystrophic with hammertoe deformities bilateral.  Assessment: Pain in limb secondary to onychomycosis and hammertoe deformity bilateral.  Plan: Debridement of nails 1 through 5 bilateral is cover service secondary to pain.

## 2013-04-10 DIAGNOSIS — IMO0002 Reserved for concepts with insufficient information to code with codable children: Secondary | ICD-10-CM | POA: Diagnosis not present

## 2013-04-10 DIAGNOSIS — M48062 Spinal stenosis, lumbar region with neurogenic claudication: Secondary | ICD-10-CM | POA: Diagnosis not present

## 2013-04-10 DIAGNOSIS — M5137 Other intervertebral disc degeneration, lumbosacral region: Secondary | ICD-10-CM | POA: Diagnosis not present

## 2013-04-10 DIAGNOSIS — M62838 Other muscle spasm: Secondary | ICD-10-CM | POA: Diagnosis not present

## 2013-04-11 DIAGNOSIS — H353190 Nonexudative age-related macular degeneration, unspecified eye, stage unspecified: Secondary | ICD-10-CM | POA: Diagnosis not present

## 2013-04-17 DIAGNOSIS — Z23 Encounter for immunization: Secondary | ICD-10-CM | POA: Diagnosis not present

## 2013-04-24 ENCOUNTER — Other Ambulatory Visit: Payer: Self-pay

## 2013-04-29 ENCOUNTER — Encounter (INDEPENDENT_AMBULATORY_CARE_PROVIDER_SITE_OTHER): Payer: Medicare Other

## 2013-04-29 DIAGNOSIS — I6529 Occlusion and stenosis of unspecified carotid artery: Secondary | ICD-10-CM | POA: Diagnosis not present

## 2013-05-08 ENCOUNTER — Other Ambulatory Visit: Payer: Self-pay | Admitting: Internal Medicine

## 2013-05-09 ENCOUNTER — Ambulatory Visit: Payer: Self-pay | Admitting: Family Medicine

## 2013-05-09 DIAGNOSIS — S2249XA Multiple fractures of ribs, unspecified side, initial encounter for closed fracture: Secondary | ICD-10-CM | POA: Diagnosis not present

## 2013-05-09 DIAGNOSIS — K449 Diaphragmatic hernia without obstruction or gangrene: Secondary | ICD-10-CM | POA: Diagnosis not present

## 2013-05-09 DIAGNOSIS — R059 Cough, unspecified: Secondary | ICD-10-CM | POA: Diagnosis not present

## 2013-05-12 ENCOUNTER — Telehealth: Payer: Self-pay

## 2013-05-12 NOTE — Telephone Encounter (Signed)
Spoke w/ pt. She is aware of results and agreeable to plan. She reports that she doesn't know when she stopped taking asa at night, but would like to know if Dr. Mariah Milling would like her resume taking it.  Please advise.  Thank you.

## 2013-05-12 NOTE — Telephone Encounter (Signed)
Would restart taking aspirin 81 mg daily

## 2013-05-12 NOTE — Telephone Encounter (Signed)
Message copied by Marilynne Halsted on Mon May 12, 2013 10:05 AM ------      Message from: Antonieta Iba      Created: Sun May 11, 2013  9:44 PM       40% carotid disease on the right      Appears stable      Repeat in 1 to 2 years ------

## 2013-05-13 NOTE — Telephone Encounter (Signed)
Spoke w/ pt.  She verbalizes understanding and will restart asa today.

## 2013-06-03 DIAGNOSIS — J309 Allergic rhinitis, unspecified: Secondary | ICD-10-CM | POA: Diagnosis not present

## 2013-06-03 DIAGNOSIS — J029 Acute pharyngitis, unspecified: Secondary | ICD-10-CM | POA: Diagnosis not present

## 2013-06-03 DIAGNOSIS — M549 Dorsalgia, unspecified: Secondary | ICD-10-CM | POA: Diagnosis not present

## 2013-06-03 DIAGNOSIS — J45909 Unspecified asthma, uncomplicated: Secondary | ICD-10-CM | POA: Diagnosis not present

## 2013-06-06 DIAGNOSIS — IMO0002 Reserved for concepts with insufficient information to code with codable children: Secondary | ICD-10-CM | POA: Diagnosis not present

## 2013-06-06 DIAGNOSIS — M47817 Spondylosis without myelopathy or radiculopathy, lumbosacral region: Secondary | ICD-10-CM | POA: Diagnosis not present

## 2013-06-06 DIAGNOSIS — M5137 Other intervertebral disc degeneration, lumbosacral region: Secondary | ICD-10-CM | POA: Diagnosis not present

## 2013-06-06 DIAGNOSIS — M48062 Spinal stenosis, lumbar region with neurogenic claudication: Secondary | ICD-10-CM | POA: Diagnosis not present

## 2013-07-01 DIAGNOSIS — J449 Chronic obstructive pulmonary disease, unspecified: Secondary | ICD-10-CM | POA: Diagnosis not present

## 2013-07-01 DIAGNOSIS — R079 Chest pain, unspecified: Secondary | ICD-10-CM | POA: Diagnosis not present

## 2013-07-01 DIAGNOSIS — J984 Other disorders of lung: Secondary | ICD-10-CM | POA: Diagnosis not present

## 2013-07-01 DIAGNOSIS — R071 Chest pain on breathing: Secondary | ICD-10-CM | POA: Diagnosis not present

## 2013-07-01 DIAGNOSIS — J8410 Pulmonary fibrosis, unspecified: Secondary | ICD-10-CM | POA: Diagnosis not present

## 2013-07-02 ENCOUNTER — Ambulatory Visit: Payer: Medicare Other | Admitting: Podiatry

## 2013-07-04 DIAGNOSIS — J45909 Unspecified asthma, uncomplicated: Secondary | ICD-10-CM | POA: Diagnosis not present

## 2013-07-04 DIAGNOSIS — G4700 Insomnia, unspecified: Secondary | ICD-10-CM | POA: Diagnosis not present

## 2013-07-04 DIAGNOSIS — R079 Chest pain, unspecified: Secondary | ICD-10-CM | POA: Diagnosis not present

## 2013-07-04 DIAGNOSIS — J029 Acute pharyngitis, unspecified: Secondary | ICD-10-CM | POA: Diagnosis not present

## 2013-07-08 DIAGNOSIS — IMO0002 Reserved for concepts with insufficient information to code with codable children: Secondary | ICD-10-CM | POA: Diagnosis not present

## 2013-07-08 DIAGNOSIS — M48062 Spinal stenosis, lumbar region with neurogenic claudication: Secondary | ICD-10-CM | POA: Diagnosis not present

## 2013-07-08 DIAGNOSIS — M47817 Spondylosis without myelopathy or radiculopathy, lumbosacral region: Secondary | ICD-10-CM | POA: Diagnosis not present

## 2013-07-08 DIAGNOSIS — M5137 Other intervertebral disc degeneration, lumbosacral region: Secondary | ICD-10-CM | POA: Diagnosis not present

## 2013-07-15 ENCOUNTER — Ambulatory Visit: Payer: Self-pay | Admitting: Family Medicine

## 2013-07-15 DIAGNOSIS — M810 Age-related osteoporosis without current pathological fracture: Secondary | ICD-10-CM | POA: Diagnosis not present

## 2013-07-23 ENCOUNTER — Encounter: Payer: Self-pay | Admitting: Podiatry

## 2013-07-23 ENCOUNTER — Ambulatory Visit (INDEPENDENT_AMBULATORY_CARE_PROVIDER_SITE_OTHER): Payer: Medicare Other | Admitting: Podiatry

## 2013-07-23 VITALS — BP 119/52 | HR 72 | Resp 16 | Ht 64.0 in | Wt 135.0 lb

## 2013-07-23 DIAGNOSIS — M79609 Pain in unspecified limb: Secondary | ICD-10-CM

## 2013-07-23 DIAGNOSIS — B351 Tinea unguium: Secondary | ICD-10-CM | POA: Diagnosis not present

## 2013-07-23 NOTE — Progress Notes (Signed)
Chelsea [name] presents today with a chief complaint of painful toenails one through 5 bilateral.  Objective: Vital signs are stable she is alert and oriented x3. Nails are thick yellow dystrophic onychomycotic and painful on palpation as well as debridement.  Assessment: Pain in limb secondary to onychomycosis 1 through 5 bilateral.  Plan: Debridement of nails 1 through 5 bilateral is cover service secondary to pain.

## 2013-07-30 DIAGNOSIS — IMO0002 Reserved for concepts with insufficient information to code with codable children: Secondary | ICD-10-CM | POA: Diagnosis not present

## 2013-07-30 DIAGNOSIS — M48062 Spinal stenosis, lumbar region with neurogenic claudication: Secondary | ICD-10-CM | POA: Diagnosis not present

## 2013-07-30 DIAGNOSIS — M5137 Other intervertebral disc degeneration, lumbosacral region: Secondary | ICD-10-CM | POA: Diagnosis not present

## 2013-07-30 DIAGNOSIS — M47817 Spondylosis without myelopathy or radiculopathy, lumbosacral region: Secondary | ICD-10-CM | POA: Diagnosis not present

## 2013-08-19 ENCOUNTER — Ambulatory Visit: Payer: Medicare Other | Admitting: Cardiovascular Disease

## 2013-08-20 DIAGNOSIS — N390 Urinary tract infection, site not specified: Secondary | ICD-10-CM | POA: Diagnosis not present

## 2013-08-20 DIAGNOSIS — J309 Allergic rhinitis, unspecified: Secondary | ICD-10-CM | POA: Diagnosis not present

## 2013-08-20 DIAGNOSIS — J45909 Unspecified asthma, uncomplicated: Secondary | ICD-10-CM | POA: Diagnosis not present

## 2013-08-20 DIAGNOSIS — M810 Age-related osteoporosis without current pathological fracture: Secondary | ICD-10-CM | POA: Diagnosis not present

## 2013-08-27 ENCOUNTER — Ambulatory Visit (INDEPENDENT_AMBULATORY_CARE_PROVIDER_SITE_OTHER): Payer: Medicare Other | Admitting: Cardiovascular Disease

## 2013-08-27 ENCOUNTER — Encounter: Payer: Self-pay | Admitting: Cardiovascular Disease

## 2013-08-27 VITALS — BP 130/60 | HR 74 | Ht 65.0 in | Wt 142.5 lb

## 2013-08-27 DIAGNOSIS — I10 Essential (primary) hypertension: Secondary | ICD-10-CM | POA: Diagnosis not present

## 2013-08-27 DIAGNOSIS — R0609 Other forms of dyspnea: Secondary | ICD-10-CM

## 2013-08-27 DIAGNOSIS — I6529 Occlusion and stenosis of unspecified carotid artery: Secondary | ICD-10-CM

## 2013-08-27 DIAGNOSIS — R0989 Other specified symptoms and signs involving the circulatory and respiratory systems: Secondary | ICD-10-CM

## 2013-08-27 DIAGNOSIS — R0602 Shortness of breath: Secondary | ICD-10-CM

## 2013-08-27 DIAGNOSIS — E785 Hyperlipidemia, unspecified: Secondary | ICD-10-CM

## 2013-08-27 DIAGNOSIS — I25810 Atherosclerosis of coronary artery bypass graft(s) without angina pectoris: Secondary | ICD-10-CM

## 2013-08-27 MED ORDER — NITROGLYCERIN 0.4 MG SL SUBL: 0.4000 mg | SUBLINGUAL_TABLET | SUBLINGUAL | Status: DC | PRN

## 2013-08-27 NOTE — Assessment & Plan Note (Signed)
Blood pressure is well controlled on today's visit. No changes made to the medications. 

## 2013-08-27 NOTE — Progress Notes (Signed)
Patient ID: Chelsea [name], female    DOB: 1924/02/15, 80 y.o.   MRN: [id number]  HPI Comments: 80 year-old woman with CAD s/p CABG, diastolic dysfunction, carotid endarterectomy on the left 10 years ago, now with 40-59% carotid disease , mildly dilated left atrium, mild MR, presenting today for follow-up evaluation.   dizziness in the past, h/o fall with right hip fracture, right arm fracture, 3 ribs injured. She had a long recovery in [place] where the injury occurred. She is now back at [facility]. Previous upper respiratory infection. 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 She has had significant pain, cortisone shots x3 to her hip now wearing a TENS unit for pain. She reports having moderate spinal stenosis.  several admissions to the hospital for abdominal pain. One was in late December 2013, any in February 2014 .  Weight was 147 pounds September 2013, down to 138 pounds in February 2014, down to 131 pounds. Weight has improved on today's visit now up to 143 pounds   hip surgery in March 2014. She had a complicated recovery with bronchitis requiring long period of rehabilitation.  She walks with a cane. Continues to have rare abdominal pain. She did take laxatives for a short period of time, now has frequent bowel movements without laxatives.  She reports having hypoxia that her visit with [facility]. Ambulation in the office today showed saturations maintained in the mid 90s. Minimal prior smoking history She does have chronic mild shortness of breath. Continues to have significant back pain, leg pain She would like to start an exercise program at [facility]  Previous echo in 05/2009 showed normal LV function without valvular abnormalities   stress test was negative for ischemia.   carotid ultrasound last year showed mild bilateral disease   EKG shows normal sinus rhythm with a rate of 74 beats per minute, no significant ST or T wave changes     Outpatient  Encounter Prescriptions as of 08/27/2013  Medication Sig  . albuterol (PROVENTIL HFA;VENTOLIN HFA) 108 (90 BASE) MCG/ACT inhaler Inhale 2 puffs into the lungs every 6 (six) hours as needed.  Marland Kitchen amLODipine (NORVASC) 10 MG tablet Take 10 mg by mouth daily.  Marland Kitchen aspirin 81 MG tablet Take 81 mg by mouth daily.  Marland Kitchen atorvastatin (LIPITOR) 80 MG tablet Take 1 tablet (80 mg total) by mouth daily.  . budesonide-formoterol (SYMBICORT) 80-4.5 MCG/ACT inhaler Inhale 2 puffs into the lungs 2 (two) times daily.    . ciprofloxacin (CIPRO) 500 MG tablet Take 500 mg by mouth 2 (two) times daily.  . diazepam (VALIUM) 5 MG tablet Take 5 mg by mouth every 6 (six) hours as needed.    . fluticasone (FLONASE) 50 MCG/ACT nasal spray Place 2 sprays into both nostrils daily.  . lansoprazole (PREVACID) 30 MG capsule Take 30 mg by mouth 2 (two) times daily.   Marland Kitchen levothyroxine (SYNTHROID, LEVOTHROID) 75 MCG tablet Take 75 mcg by mouth daily.    Marland Kitchen loratadine (ALLERGY RELIEF) 10 MG tablet Take 10 mg by mouth daily.  Marland Kitchen LORazepam (ATIVAN) 0.5 MG tablet Take 0.5 mg by mouth as needed.   . metaxalone (SKELAXIN) 800 MG tablet Take 800 mg by mouth as needed for muscle spasms.  . Multiple Minerals-Vitamins (CALCIUM CITRATE +) TABS Take 1 tablet by mouth daily.    . Multiple Vitamins-Minerals (OCUVITE PRESERVISION PO) Take 1 capsule by mouth 2 (two) times daily.   . nitroGLYCERIN (NITROSTAT) 0.4 MG SL  tablet Place 0.4 mg under the tongue every 5 (five) minutes as needed.    Marland Kitchen oxyCODONE-acetaminophen (PERCOCET) 10-325 MG per tablet Take 1 tablet by mouth every 4 (four) hours as needed.  . Polyethylene Glycol 3350 (MIRALAX PO) Take by mouth daily.  . sertraline (ZOLOFT) 100 MG tablet Take 150 mg by mouth daily.   Marland Kitchen tiotropium (SPIRIVA) 18 MCG inhalation capsule Place 18 mcg into inhaler and inhale daily.    Marland Kitchen tolterodine (DETROL LA) 4 MG 24 hr capsule Take 4 mg by mouth daily.    . vitamin C (ASCORBIC ACID) 250 MG tablet Take 250 mg by  mouth daily.    Review of Systems  HENT: Negative.   Eyes: Negative.   Respiratory: Negative.   Cardiovascular: Negative.   Gastrointestinal: Negative.   Endocrine: Negative.   Musculoskeletal: Positive for gait problem.  Skin: Negative.   Allergic/Immunologic: Negative.   Neurological: Positive for weakness.  Hematological: Negative.   Psychiatric/Behavioral: Negative.   All other systems reviewed and are negative.    BP 130/60  Pulse 74  Ht 5\' 5"  (1.651 m)  Wt 142 lb 8 oz (64.638 kg)  BMI 23.71 kg/m2  Physical Exam  Nursing note and vitals reviewed. Constitutional: She is oriented to person, place, and time. She appears well-developed and well-nourished.  HENT:  Head: Normocephalic.  Nose: Nose normal.  Mouth/Throat: Oropharynx is clear and moist.  Eyes: Conjunctivae are normal. Pupils are equal, round, and reactive to light.  Neck: Normal range of motion. Neck supple. No JVD present.  Cardiovascular: Normal rate, regular rhythm, S1 normal, S2 normal, normal heart sounds and intact distal pulses.  Exam reveals no gallop and no friction rub.   No murmur heard. Pulmonary/Chest: Effort normal and breath sounds normal. No respiratory distress. She has no wheezes. She has no rales. She exhibits no tenderness.  Abdominal: Soft. Bowel sounds are normal. She exhibits no distension. There is no tenderness.  Musculoskeletal: Normal range of motion. She exhibits no edema and no tenderness.  Lymphadenopathy:    She has no cervical adenopathy.  Neurological: She is alert and oriented to person, place, and time. Coordination normal.  Skin: Skin is warm and dry. No rash noted. No erythema.  Psychiatric: She has a normal mood and affect. Her behavior is normal. Judgment and thought content normal.    Assessment and Plan

## 2013-08-27 NOTE — Assessment & Plan Note (Signed)
Encouraged her to stay on her Lipitor. Goal LDL less than 70 

## 2013-08-27 NOTE — Patient Instructions (Signed)
You are doing well. No medication changes were made.  Please call us if you have new issues that need to be addressed before your next appt.  Your physician wants you to follow-up in: 6 months.  You will receive a reminder letter in the mail two months in advance. If you don't receive a letter, please call our office to schedule the follow-up appointment.   

## 2013-08-27 NOTE — Assessment & Plan Note (Signed)
Unable to exclude ischemia as a cause of her shortness of breath. She does not want stress testing at this time.

## 2013-08-27 NOTE — Assessment & Plan Note (Signed)
Etiology of her shortness of breath is likely multifactorial. Underlying coronary disease, deconditioning, kyphosis, secondhand smoke exposure. No hypoxia noted on today's visit with ambulation. Oxygen not indicated

## 2013-08-27 NOTE — Assessment & Plan Note (Signed)
Stable mild to moderate carotid arterial disease. Repeat ultrasound in one year   

## 2013-09-03 DIAGNOSIS — M48062 Spinal stenosis, lumbar region with neurogenic claudication: Secondary | ICD-10-CM | POA: Diagnosis not present

## 2013-09-03 DIAGNOSIS — IMO0002 Reserved for concepts with insufficient information to code with codable children: Secondary | ICD-10-CM | POA: Diagnosis not present

## 2013-09-03 DIAGNOSIS — M47817 Spondylosis without myelopathy or radiculopathy, lumbosacral region: Secondary | ICD-10-CM | POA: Diagnosis not present

## 2013-09-04 DIAGNOSIS — Z471 Aftercare following joint replacement surgery: Secondary | ICD-10-CM | POA: Diagnosis not present

## 2013-09-04 DIAGNOSIS — Z96649 Presence of unspecified artificial hip joint: Secondary | ICD-10-CM | POA: Diagnosis not present

## 2013-09-23 DIAGNOSIS — J309 Allergic rhinitis, unspecified: Secondary | ICD-10-CM | POA: Diagnosis not present

## 2013-09-23 DIAGNOSIS — L0390 Cellulitis, unspecified: Secondary | ICD-10-CM | POA: Diagnosis not present

## 2013-09-23 DIAGNOSIS — S81009A Unspecified open wound, unspecified knee, initial encounter: Secondary | ICD-10-CM | POA: Diagnosis not present

## 2013-09-23 DIAGNOSIS — S81809A Unspecified open wound, unspecified lower leg, initial encounter: Secondary | ICD-10-CM | POA: Diagnosis not present

## 2013-09-23 DIAGNOSIS — M810 Age-related osteoporosis without current pathological fracture: Secondary | ICD-10-CM | POA: Diagnosis not present

## 2013-09-23 DIAGNOSIS — L0291 Cutaneous abscess, unspecified: Secondary | ICD-10-CM | POA: Diagnosis not present

## 2013-10-01 ENCOUNTER — Ambulatory Visit: Payer: Self-pay | Admitting: Family Medicine

## 2013-10-01 DIAGNOSIS — L0291 Cutaneous abscess, unspecified: Secondary | ICD-10-CM | POA: Diagnosis not present

## 2013-10-01 DIAGNOSIS — M79609 Pain in unspecified limb: Secondary | ICD-10-CM | POA: Diagnosis not present

## 2013-10-01 DIAGNOSIS — M810 Age-related osteoporosis without current pathological fracture: Secondary | ICD-10-CM | POA: Diagnosis not present

## 2013-10-01 DIAGNOSIS — J309 Allergic rhinitis, unspecified: Secondary | ICD-10-CM | POA: Diagnosis not present

## 2013-10-01 DIAGNOSIS — L0390 Cellulitis, unspecified: Secondary | ICD-10-CM | POA: Diagnosis not present

## 2013-10-01 DIAGNOSIS — M7989 Other specified soft tissue disorders: Secondary | ICD-10-CM | POA: Diagnosis not present

## 2013-10-01 DIAGNOSIS — R609 Edema, unspecified: Secondary | ICD-10-CM | POA: Diagnosis not present

## 2013-10-06 DIAGNOSIS — L0291 Cutaneous abscess, unspecified: Secondary | ICD-10-CM | POA: Diagnosis not present

## 2013-10-06 DIAGNOSIS — M810 Age-related osteoporosis without current pathological fracture: Secondary | ICD-10-CM | POA: Diagnosis not present

## 2013-10-06 DIAGNOSIS — R609 Edema, unspecified: Secondary | ICD-10-CM | POA: Diagnosis not present

## 2013-10-06 DIAGNOSIS — J309 Allergic rhinitis, unspecified: Secondary | ICD-10-CM | POA: Diagnosis not present

## 2013-10-06 DIAGNOSIS — Z0000 Encounter for general adult medical examination without abnormal findings: Secondary | ICD-10-CM | POA: Diagnosis not present

## 2013-10-07 DIAGNOSIS — R609 Edema, unspecified: Secondary | ICD-10-CM | POA: Diagnosis not present

## 2013-10-07 DIAGNOSIS — I2510 Atherosclerotic heart disease of native coronary artery without angina pectoris: Secondary | ICD-10-CM | POA: Diagnosis not present

## 2013-10-07 DIAGNOSIS — D649 Anemia, unspecified: Secondary | ICD-10-CM | POA: Diagnosis not present

## 2013-10-18 DIAGNOSIS — R059 Cough, unspecified: Secondary | ICD-10-CM | POA: Diagnosis not present

## 2013-10-18 DIAGNOSIS — M810 Age-related osteoporosis without current pathological fracture: Secondary | ICD-10-CM | POA: Diagnosis not present

## 2013-10-18 DIAGNOSIS — Z0000 Encounter for general adult medical examination without abnormal findings: Secondary | ICD-10-CM | POA: Diagnosis not present

## 2013-10-18 DIAGNOSIS — J329 Chronic sinusitis, unspecified: Secondary | ICD-10-CM | POA: Diagnosis not present

## 2013-10-18 DIAGNOSIS — R05 Cough: Secondary | ICD-10-CM | POA: Diagnosis not present

## 2013-10-22 ENCOUNTER — Ambulatory Visit (INDEPENDENT_AMBULATORY_CARE_PROVIDER_SITE_OTHER): Payer: Medicare Other

## 2013-10-22 ENCOUNTER — Emergency Department: Payer: Self-pay | Admitting: Emergency Medicine

## 2013-10-22 ENCOUNTER — Ambulatory Visit (INDEPENDENT_AMBULATORY_CARE_PROVIDER_SITE_OTHER): Payer: Medicare Other | Admitting: Podiatry

## 2013-10-22 VITALS — BP 120/53 | HR 64 | Resp 16

## 2013-10-22 DIAGNOSIS — M79609 Pain in unspecified limb: Secondary | ICD-10-CM | POA: Diagnosis not present

## 2013-10-22 DIAGNOSIS — R112 Nausea with vomiting, unspecified: Secondary | ICD-10-CM | POA: Diagnosis not present

## 2013-10-22 DIAGNOSIS — K5289 Other specified noninfective gastroenteritis and colitis: Secondary | ICD-10-CM | POA: Diagnosis not present

## 2013-10-22 DIAGNOSIS — K5730 Diverticulosis of large intestine without perforation or abscess without bleeding: Secondary | ICD-10-CM | POA: Diagnosis not present

## 2013-10-22 DIAGNOSIS — R1084 Generalized abdominal pain: Secondary | ICD-10-CM | POA: Diagnosis not present

## 2013-10-22 DIAGNOSIS — R197 Diarrhea, unspecified: Secondary | ICD-10-CM | POA: Diagnosis not present

## 2013-10-22 DIAGNOSIS — M25579 Pain in unspecified ankle and joints of unspecified foot: Secondary | ICD-10-CM

## 2013-10-22 DIAGNOSIS — B351 Tinea unguium: Secondary | ICD-10-CM

## 2013-10-22 DIAGNOSIS — Z9089 Acquired absence of other organs: Secondary | ICD-10-CM | POA: Diagnosis not present

## 2013-10-22 DIAGNOSIS — Z951 Presence of aortocoronary bypass graft: Secondary | ICD-10-CM | POA: Diagnosis not present

## 2013-10-22 DIAGNOSIS — S8012XA Contusion of left lower leg, initial encounter: Secondary | ICD-10-CM

## 2013-10-22 DIAGNOSIS — I10 Essential (primary) hypertension: Secondary | ICD-10-CM | POA: Diagnosis not present

## 2013-10-22 DIAGNOSIS — Z79899 Other long term (current) drug therapy: Secondary | ICD-10-CM | POA: Diagnosis not present

## 2013-10-22 NOTE — Progress Notes (Signed)
Chelsea [name] presents today with a chief complaint of painful elongated toenails she also has a painful area to the anterior ankle where she hit the car door with her leg.  Objective: Vital signs are stable she is alert and oriented x3. Pulses are palpable bilateral. Nails are thick yellow dystrophic with mycotic and painful palpation as well as debridement. She has a wound to the anterior [place] it appears to be healing quite nicely. Mild edema distal to that area. Radiographic evaluation does not demonstrate any type of osseous abnormalities associated with the injury.  Assessment: Pain in limb secondary to onychomycosis 1 through 5 bilateral as well as contusion left anterior tibia.  Plan: Debridement of nails 1 through 5 bilateral is cover service secondary to pain.

## 2013-10-23 DIAGNOSIS — K5730 Diverticulosis of large intestine without perforation or abscess without bleeding: Secondary | ICD-10-CM | POA: Diagnosis not present

## 2013-10-23 LAB — CBC WITH DIFFERENTIAL/PLATELET
Basophil #: 0 10*3/uL (ref 0.0–0.1)
Basophil %: 0.2 %
Eosinophil #: 0 10*3/uL (ref 0.0–0.7)
Eosinophil %: 0.4 %
HCT: 39.1 % (ref 35.0–47.0)
HGB: 12.4 g/dL (ref 12.0–16.0)
Lymphocyte #: 0.3 10*3/uL — ABNORMAL LOW (ref 1.0–3.6)
Lymphocyte %: 2.8 %
MCH: 29.6 pg (ref 26.0–34.0)
MCHC: 31.6 g/dL — ABNORMAL LOW (ref 32.0–36.0)
MCV: 94 fL (ref 80–100)
Monocyte #: 0 x10 3/mm — ABNORMAL LOW (ref 0.2–0.9)
Monocyte %: 0.3 %
Neutrophil #: 11.2 10*3/uL — ABNORMAL HIGH (ref 1.4–6.5)
Neutrophil %: 96.3 %
Platelet: 357 10*3/uL (ref 150–440)
RBC: 4.18 10*6/uL (ref 3.80–5.20)
RDW: 14.5 % (ref 11.5–14.5)
WBC: 11.6 10*3/uL — ABNORMAL HIGH (ref 3.6–11.0)

## 2013-10-23 LAB — COMPREHENSIVE METABOLIC PANEL
ANION GAP: 9 (ref 7–16)
Albumin: 3.3 g/dL — ABNORMAL LOW (ref 3.4–5.0)
Alkaline Phosphatase: 304 U/L — ABNORMAL HIGH
BILIRUBIN TOTAL: 0.5 mg/dL (ref 0.2–1.0)
BUN: 28 mg/dL — AB (ref 7–18)
CALCIUM: 8.8 mg/dL (ref 8.5–10.1)
CO2: 23 mmol/L (ref 21–32)
CREATININE: 1.25 mg/dL (ref 0.60–1.30)
Chloride: 107 mmol/L (ref 98–107)
GFR CALC AF AMER: 45 — AB
GFR CALC NON AF AMER: 39 — AB
GLUCOSE: 121 mg/dL — AB (ref 65–99)
Osmolality: 284 (ref 275–301)
POTASSIUM: 4.3 mmol/L (ref 3.5–5.1)
SGOT(AST): 37 U/L (ref 15–37)
SGPT (ALT): 15 U/L (ref 12–78)
SODIUM: 139 mmol/L (ref 136–145)
Total Protein: 7.3 g/dL (ref 6.4–8.2)

## 2013-10-23 LAB — URINALYSIS, COMPLETE
BACTERIA: NONE SEEN
Bilirubin,UR: NEGATIVE
Blood: NEGATIVE
Glucose,UR: NEGATIVE mg/dL (ref 0–75)
Hyaline Cast: 1
Leukocyte Esterase: NEGATIVE
Nitrite: NEGATIVE
PH: 5 (ref 4.5–8.0)
PROTEIN: NEGATIVE
RBC,UR: NONE SEEN /HPF (ref 0–5)
SPECIFIC GRAVITY: 1.013 (ref 1.003–1.030)

## 2013-10-23 LAB — LIPASE, BLOOD: LIPASE: 101 U/L (ref 73–393)

## 2013-10-23 LAB — TROPONIN I

## 2013-11-04 DIAGNOSIS — E039 Hypothyroidism, unspecified: Secondary | ICD-10-CM | POA: Diagnosis not present

## 2013-11-04 DIAGNOSIS — M48062 Spinal stenosis, lumbar region with neurogenic claudication: Secondary | ICD-10-CM | POA: Diagnosis not present

## 2013-11-04 DIAGNOSIS — M5116 Intervertebral disc disorders with radiculopathy, lumbar region: Secondary | ICD-10-CM | POA: Insufficient documentation

## 2013-11-04 DIAGNOSIS — M5137 Other intervertebral disc degeneration, lumbosacral region: Secondary | ICD-10-CM | POA: Diagnosis not present

## 2013-11-04 DIAGNOSIS — M5136 Other intervertebral disc degeneration, lumbar region: Secondary | ICD-10-CM | POA: Insufficient documentation

## 2013-11-04 DIAGNOSIS — M48061 Spinal stenosis, lumbar region without neurogenic claudication: Secondary | ICD-10-CM | POA: Insufficient documentation

## 2013-11-04 DIAGNOSIS — IMO0002 Reserved for concepts with insufficient information to code with codable children: Secondary | ICD-10-CM | POA: Diagnosis not present

## 2013-11-04 DIAGNOSIS — R6889 Other general symptoms and signs: Secondary | ICD-10-CM | POA: Diagnosis not present

## 2013-11-27 DIAGNOSIS — L570 Actinic keratosis: Secondary | ICD-10-CM | POA: Diagnosis not present

## 2013-11-27 DIAGNOSIS — D692 Other nonthrombocytopenic purpura: Secondary | ICD-10-CM | POA: Diagnosis not present

## 2013-12-17 DIAGNOSIS — E039 Hypothyroidism, unspecified: Secondary | ICD-10-CM | POA: Diagnosis not present

## 2013-12-17 DIAGNOSIS — R609 Edema, unspecified: Secondary | ICD-10-CM | POA: Diagnosis not present

## 2013-12-31 ENCOUNTER — Ambulatory Visit: Payer: Self-pay | Admitting: Specialist

## 2013-12-31 DIAGNOSIS — J45909 Unspecified asthma, uncomplicated: Secondary | ICD-10-CM | POA: Diagnosis not present

## 2013-12-31 DIAGNOSIS — J984 Other disorders of lung: Secondary | ICD-10-CM | POA: Diagnosis not present

## 2013-12-31 DIAGNOSIS — M899 Disorder of bone, unspecified: Secondary | ICD-10-CM | POA: Diagnosis not present

## 2013-12-31 DIAGNOSIS — M949 Disorder of cartilage, unspecified: Secondary | ICD-10-CM | POA: Diagnosis not present

## 2013-12-31 DIAGNOSIS — K449 Diaphragmatic hernia without obstruction or gangrene: Secondary | ICD-10-CM | POA: Diagnosis not present

## 2014-01-01 DIAGNOSIS — R6889 Other general symptoms and signs: Secondary | ICD-10-CM | POA: Diagnosis not present

## 2014-01-07 DIAGNOSIS — J449 Chronic obstructive pulmonary disease, unspecified: Secondary | ICD-10-CM | POA: Diagnosis not present

## 2014-01-07 DIAGNOSIS — J8410 Pulmonary fibrosis, unspecified: Secondary | ICD-10-CM | POA: Diagnosis not present

## 2014-01-07 DIAGNOSIS — R0609 Other forms of dyspnea: Secondary | ICD-10-CM | POA: Diagnosis not present

## 2014-01-09 DIAGNOSIS — IMO0002 Reserved for concepts with insufficient information to code with codable children: Secondary | ICD-10-CM | POA: Diagnosis not present

## 2014-01-09 DIAGNOSIS — M48062 Spinal stenosis, lumbar region with neurogenic claudication: Secondary | ICD-10-CM | POA: Diagnosis not present

## 2014-01-09 DIAGNOSIS — M5137 Other intervertebral disc degeneration, lumbosacral region: Secondary | ICD-10-CM | POA: Diagnosis not present

## 2014-01-26 ENCOUNTER — Ambulatory Visit: Payer: Medicare Other | Admitting: Podiatry

## 2014-02-09 ENCOUNTER — Ambulatory Visit (INDEPENDENT_AMBULATORY_CARE_PROVIDER_SITE_OTHER): Payer: Medicare Other | Admitting: Podiatry

## 2014-02-09 DIAGNOSIS — B351 Tinea unguium: Secondary | ICD-10-CM | POA: Diagnosis not present

## 2014-02-09 DIAGNOSIS — M79676 Pain in unspecified toe(s): Secondary | ICD-10-CM

## 2014-02-09 DIAGNOSIS — M79609 Pain in unspecified limb: Secondary | ICD-10-CM

## 2014-02-09 NOTE — Progress Notes (Signed)
She presents today with a chief complaint of painful elongated toenails.  Objective: Pulses are palpable bilateral. Nails are thick yellow dystrophic onychomycotic and painful palpation.  Assessment: Pain in limb secondary to onychomycosis 1 through 5 bilateral.  Plan: Debridement of nails 1 through 5 bilateral is cover service secondary to pain followup with her in 3 months. 

## 2014-02-10 DIAGNOSIS — M48062 Spinal stenosis, lumbar region with neurogenic claudication: Secondary | ICD-10-CM | POA: Diagnosis not present

## 2014-02-10 DIAGNOSIS — M5137 Other intervertebral disc degeneration, lumbosacral region: Secondary | ICD-10-CM | POA: Diagnosis not present

## 2014-02-10 DIAGNOSIS — IMO0002 Reserved for concepts with insufficient information to code with codable children: Secondary | ICD-10-CM | POA: Diagnosis not present

## 2014-03-06 DIAGNOSIS — J45909 Unspecified asthma, uncomplicated: Secondary | ICD-10-CM | POA: Diagnosis not present

## 2014-03-06 DIAGNOSIS — G4700 Insomnia, unspecified: Secondary | ICD-10-CM | POA: Diagnosis not present

## 2014-03-06 DIAGNOSIS — R197 Diarrhea, unspecified: Secondary | ICD-10-CM | POA: Diagnosis not present

## 2014-03-06 DIAGNOSIS — J329 Chronic sinusitis, unspecified: Secondary | ICD-10-CM | POA: Diagnosis not present

## 2014-03-10 DIAGNOSIS — J329 Chronic sinusitis, unspecified: Secondary | ICD-10-CM | POA: Diagnosis not present

## 2014-03-10 DIAGNOSIS — R197 Diarrhea, unspecified: Secondary | ICD-10-CM | POA: Diagnosis not present

## 2014-03-10 DIAGNOSIS — I10 Essential (primary) hypertension: Secondary | ICD-10-CM | POA: Diagnosis not present

## 2014-03-16 DIAGNOSIS — J320 Chronic maxillary sinusitis: Secondary | ICD-10-CM | POA: Diagnosis not present

## 2014-03-16 DIAGNOSIS — J8410 Pulmonary fibrosis, unspecified: Secondary | ICD-10-CM | POA: Diagnosis not present

## 2014-03-16 DIAGNOSIS — J449 Chronic obstructive pulmonary disease, unspecified: Secondary | ICD-10-CM | POA: Diagnosis not present

## 2014-03-20 DIAGNOSIS — I10 Essential (primary) hypertension: Secondary | ICD-10-CM | POA: Diagnosis not present

## 2014-03-20 DIAGNOSIS — R7989 Other specified abnormal findings of blood chemistry: Secondary | ICD-10-CM | POA: Diagnosis not present

## 2014-03-25 IMAGING — CT CT CHEST W/O CM
1 of 2 series · 14 of 32 positions shown, 18 images · non-contrast
Comparison: none

REASON FOR EXAM: Pulmonary nodules
COMMENTS:

PROCEDURE:     KCT - KCT CHEST WITHOUT CONTRAST  - December 19, 2011  [DATE]
RESULT:
TECHNIQUE: CT of the chest is performed with a noncontrast technique and
compared to the previous exam which was also performed without contrast on 20 September, 2010. Images are reconstructed at 3.0 mm slice thickness in the axial
plane.

[Series 2: chest w/o 3.0 i41f 3 · axial · non-contrast · 0.64mm/px · z∈[-627,-363]mm · 14 of 106 slices shown, 18 images]
[im 9/106  mediastinal]
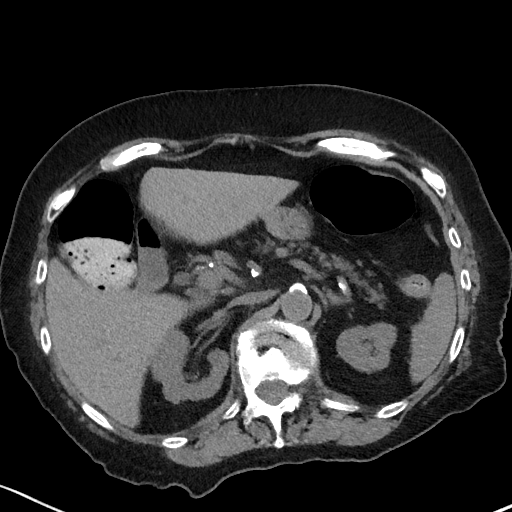
[im 9/106  lung]
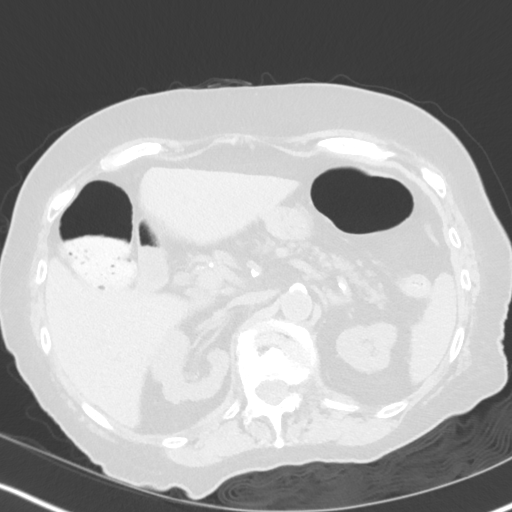
[im 17/106  lung]
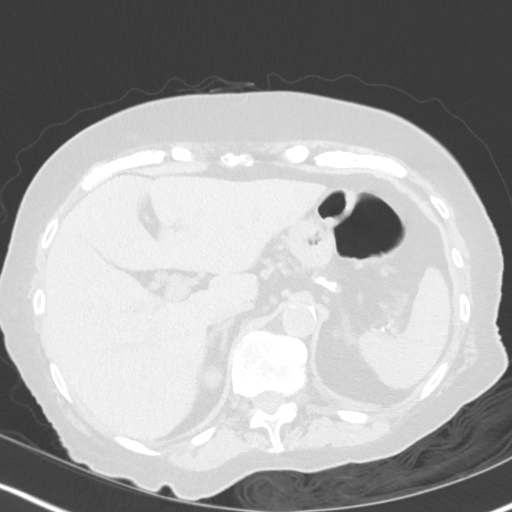
[im 25/106  lung]
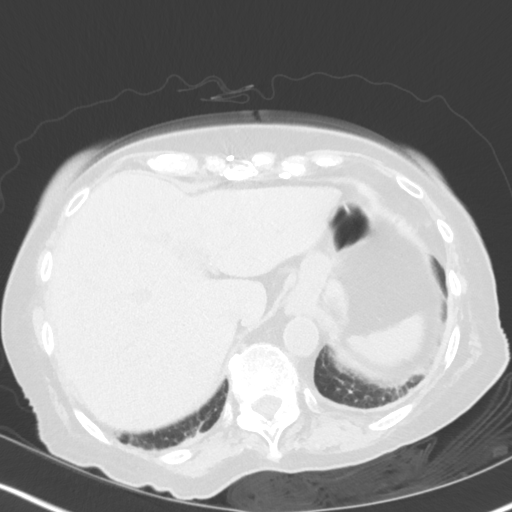
[im 33/106  lung]
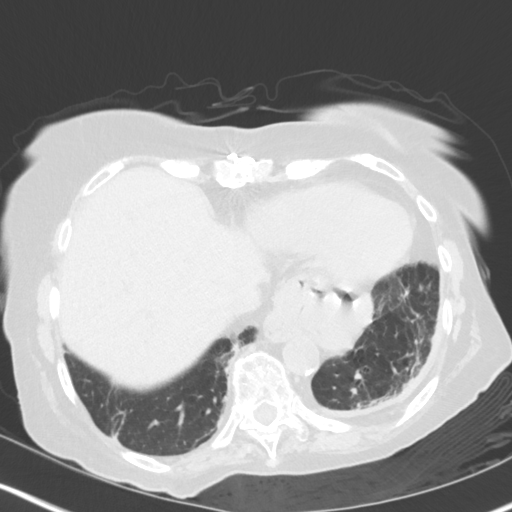
[im 41/106  mediastinal]
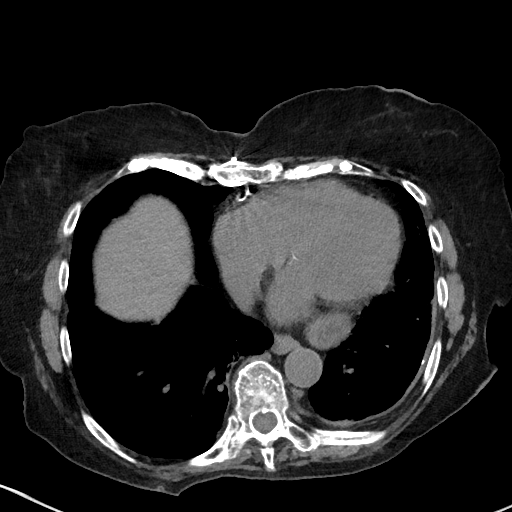
[im 41/106  lung]
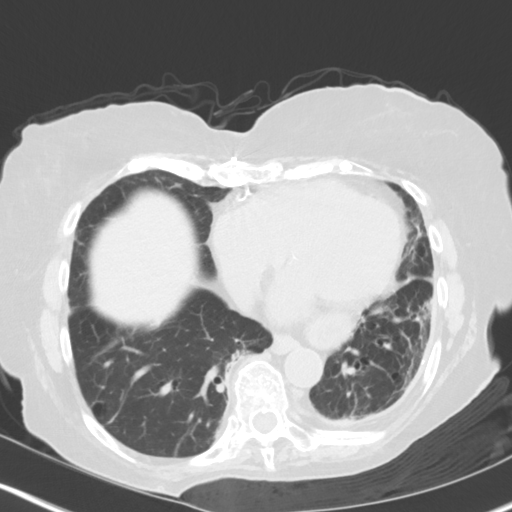
[im 49/106  lung]
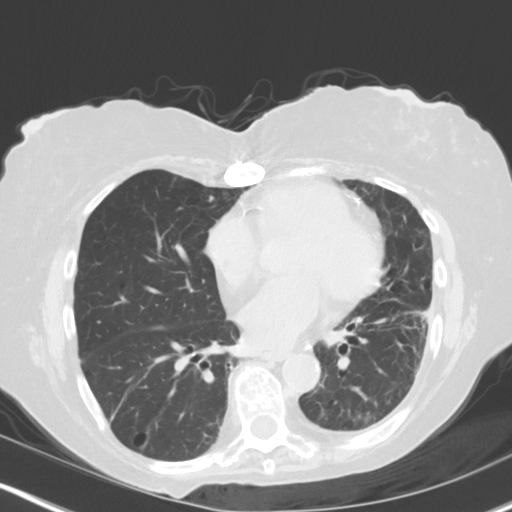
[im 51/106  lung]
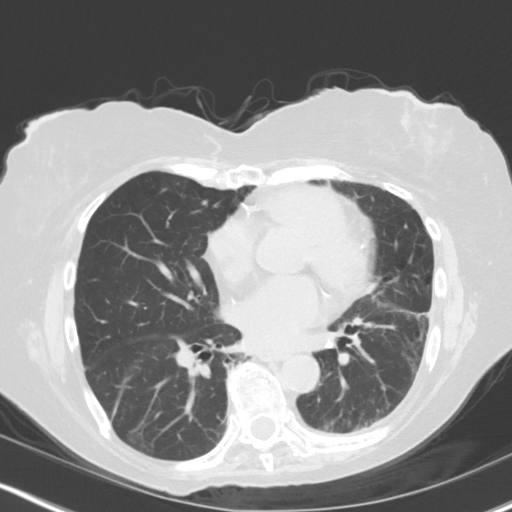
[im 53/106  lung]
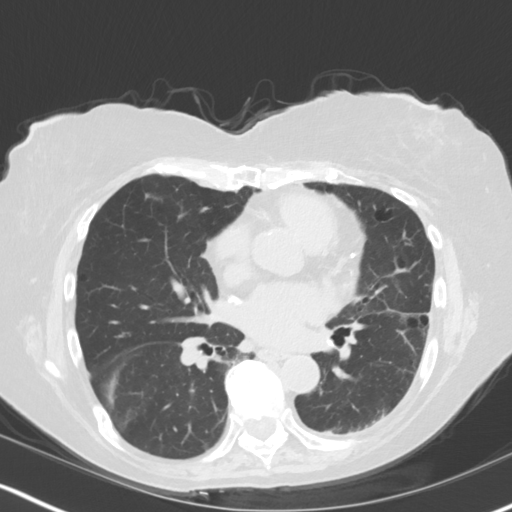
[im 57/106  mediastinal]
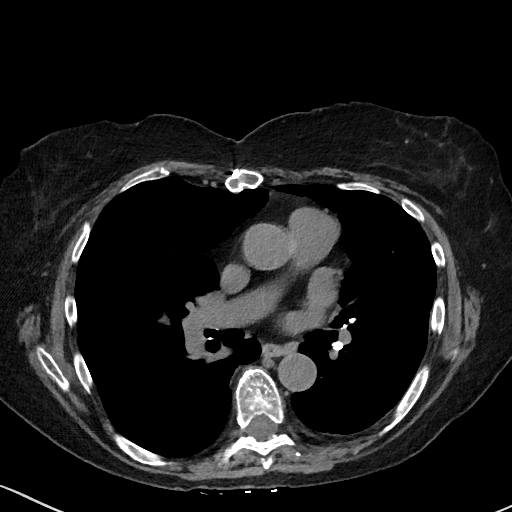
[im 57/106  lung]
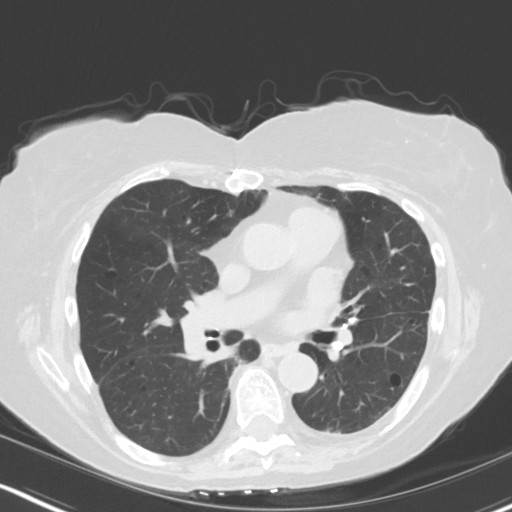
[im 65/106  lung]
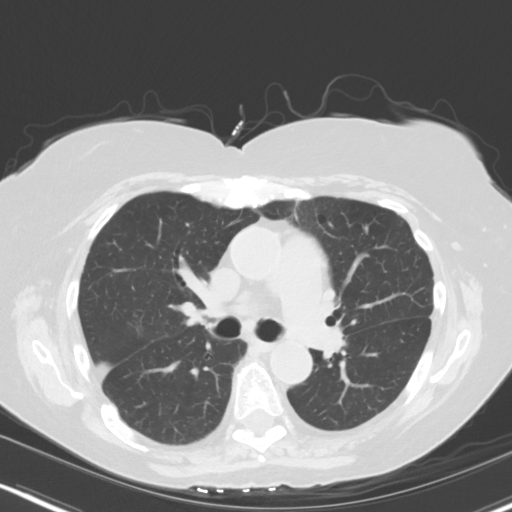
[im 73/106  lung]
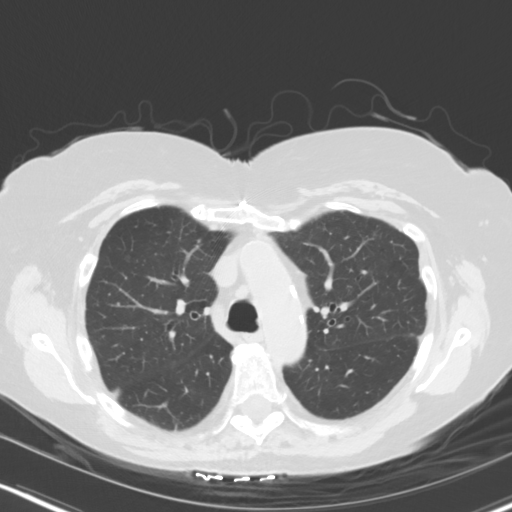
[im 81/106  lung]
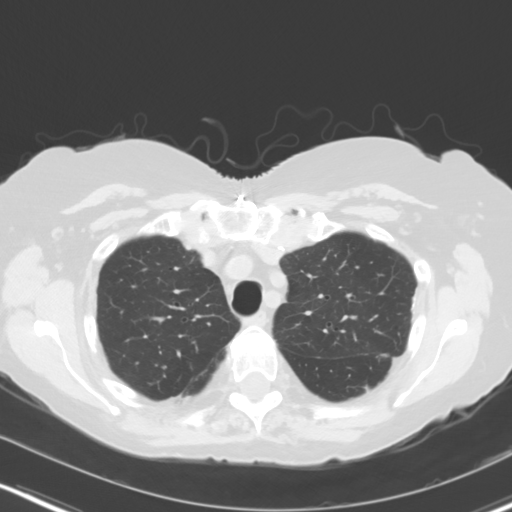
[im 89/106  mediastinal]
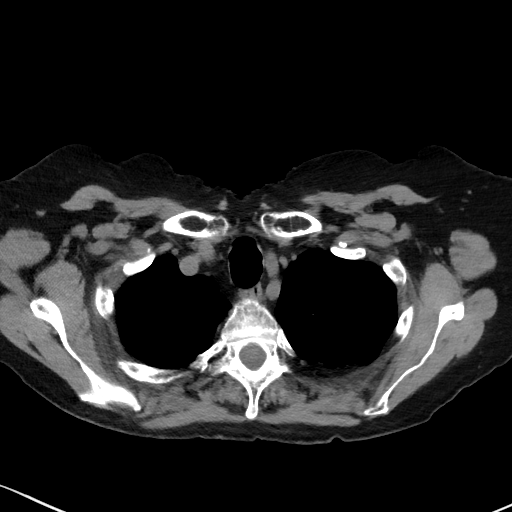
[im 89/106  lung]
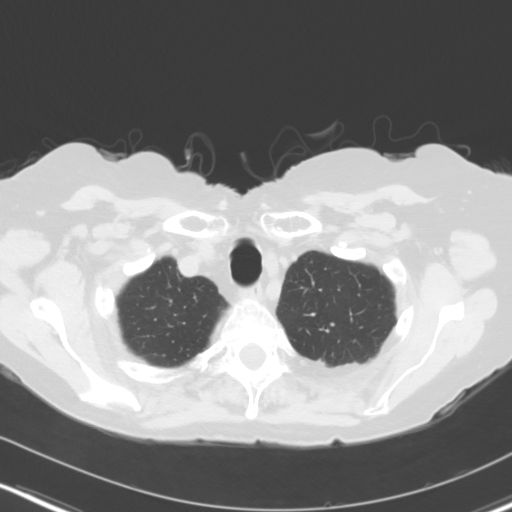
[im 97/106  lung]
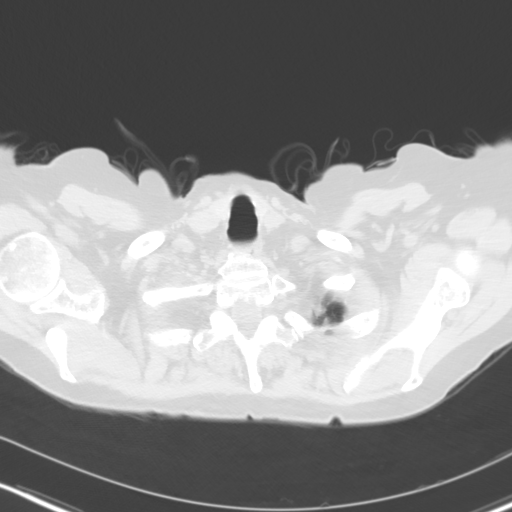

[14 of 32 positions shown; findings below may reference images not displayed]

FINDINGS: Lung window images demonstrate extensive fibrotic changes in the
lung bases, worse in the left lower lobe with some subpleural thickening and
fibrotic stranding and interlobular septal thickening. Similar less
prominent changes are seen in the right lower lobe and lingula. Small
bullous areas are present in both lower lobes. Early bronchiectasis is seen
in the left lower lobe anterior basal region. The 3 mm nodular density seen
in the left upper lobe on image 10 previously is not evident and may have
been mucus within a bronchus given the appearance on image 11 of the old
study. There are some tiny calcified nodules in both lower lobes. There is
pleural thickening in the left hemithorax especially inferiorly. There are
some minimal areas of pleural calcification as noted on image 64 laterally
and in the area of images 68 through 74 laterally and posterolaterally.
There is deformity of posterior ribs on the right consistent with old healed
fractures. No acute bony abnormality is evident. There is no mediastinal or
hilar mass or adenopathy. The included upper abdominal structures appear
unremarkable. Cholecystectomy clips are present.
IMPRESSION: 1.  Granulomatous calcifications appear present in both lungs. There is
pleural thickening and some pleural calcification in the left hemithorax.
Correlate for history of asbestos exposure.
2.  A moderate sized, hiatal hernia is present but not mentioned above.
3.  Chronic fibrotic and emphysematous changes with some bullous areas are
present as described.
4.  Nodular density seen previously in the left lower lobe is not definitely
identified. The left upper lobe nodular density measuring 3 to 4 mm on image
[DATE] have represented some mucus within a small airway given the
appearance on image 11.

[REDACTED]

## 2014-03-31 DIAGNOSIS — M5136 Other intervertebral disc degeneration, lumbar region: Secondary | ICD-10-CM | POA: Diagnosis not present

## 2014-03-31 DIAGNOSIS — M5416 Radiculopathy, lumbar region: Secondary | ICD-10-CM | POA: Diagnosis not present

## 2014-04-06 DIAGNOSIS — Z23 Encounter for immunization: Secondary | ICD-10-CM | POA: Diagnosis not present

## 2014-04-17 DIAGNOSIS — H3531 Nonexudative age-related macular degeneration: Secondary | ICD-10-CM | POA: Diagnosis not present

## 2014-04-21 DIAGNOSIS — R197 Diarrhea, unspecified: Secondary | ICD-10-CM | POA: Diagnosis not present

## 2014-04-21 DIAGNOSIS — J309 Allergic rhinitis, unspecified: Secondary | ICD-10-CM | POA: Diagnosis not present

## 2014-05-06 ENCOUNTER — Encounter: Payer: Self-pay | Admitting: Cardiovascular Disease

## 2014-05-06 ENCOUNTER — Other Ambulatory Visit: Payer: Self-pay

## 2014-05-06 ENCOUNTER — Ambulatory Visit: Payer: Federal, State, Local not specified - PPO | Admitting: Cardiovascular Disease

## 2014-05-06 ENCOUNTER — Ambulatory Visit (INDEPENDENT_AMBULATORY_CARE_PROVIDER_SITE_OTHER): Payer: Medicare Other | Admitting: Cardiovascular Disease

## 2014-05-06 ENCOUNTER — Ambulatory Visit: Payer: Self-pay | Admitting: Cardiovascular Disease

## 2014-05-06 VITALS — BP 110/52 | HR 65 | Ht 65.0 in | Wt 150.5 lb

## 2014-05-06 DIAGNOSIS — E785 Hyperlipidemia, unspecified: Secondary | ICD-10-CM

## 2014-05-06 DIAGNOSIS — I25719 Atherosclerosis of autologous vein coronary artery bypass graft(s) with unspecified angina pectoris: Secondary | ICD-10-CM | POA: Diagnosis not present

## 2014-05-06 DIAGNOSIS — R0602 Shortness of breath: Secondary | ICD-10-CM | POA: Diagnosis not present

## 2014-05-06 DIAGNOSIS — I10 Essential (primary) hypertension: Secondary | ICD-10-CM | POA: Diagnosis not present

## 2014-05-06 DIAGNOSIS — R1084 Generalized abdominal pain: Secondary | ICD-10-CM | POA: Diagnosis not present

## 2014-05-06 DIAGNOSIS — R0609 Other forms of dyspnea: Secondary | ICD-10-CM | POA: Diagnosis not present

## 2014-05-06 DIAGNOSIS — R05 Cough: Secondary | ICD-10-CM | POA: Diagnosis not present

## 2014-05-06 DIAGNOSIS — I25810 Atherosclerosis of coronary artery bypass graft(s) without angina pectoris: Secondary | ICD-10-CM | POA: Diagnosis not present

## 2014-05-06 DIAGNOSIS — I6523 Occlusion and stenosis of bilateral carotid arteries: Secondary | ICD-10-CM | POA: Diagnosis not present

## 2014-05-06 DIAGNOSIS — M8580 Other specified disorders of bone density and structure, unspecified site: Secondary | ICD-10-CM | POA: Diagnosis not present

## 2014-05-06 DIAGNOSIS — J45909 Unspecified asthma, uncomplicated: Secondary | ICD-10-CM | POA: Diagnosis not present

## 2014-05-06 DIAGNOSIS — M479 Spondylosis, unspecified: Secondary | ICD-10-CM | POA: Diagnosis not present

## 2014-05-06 NOTE — Assessment & Plan Note (Signed)
Stable mild to moderate carotid arterial disease. Repeat ultrasound in one year

## 2014-05-06 NOTE — Patient Instructions (Signed)
You are doing well. No medication changes were made.  We will order a CXR for shortness of breath (AP and lateral) We will order labs to take with you (lipids, LFTS, BNP)  Please call us if you have new issues that need to be addressed before your next appt.  Your physician wants you to follow-up in: 6 months.  You will receive a reminder letter in the mail two months in advance. If you don't receive a letter, please call our office to schedule the follow-up appointment.

## 2014-05-06 NOTE — Assessment & Plan Note (Signed)
She continues to have chronic abdominal pain. Etiology unclear. This was discussed with her. No new treatment options at this time

## 2014-05-06 NOTE — Assessment & Plan Note (Signed)
We will schedule her for lipid panel at her convenience. Suggested that she stay on her Lipitor. Last lab work in 2014

## 2014-05-06 NOTE — Assessment & Plan Note (Signed)
Currently with no symptoms of angina. No further workup at this time. Continue current medication regimen. 

## 2014-05-06 NOTE — Assessment & Plan Note (Signed)
Blood pressure is well controlled on today's visit. No changes made to the medications. 

## 2014-05-06 NOTE — Assessment & Plan Note (Signed)
Clinical exam concerning for pulmonary fibrosis in the left lower base. Scant in the right lower base. Recommended she follow-up with Dr. Raul Del

## 2014-05-06 NOTE — Progress Notes (Signed)
Patient ID: Chelsea [name], female    DOB: 09-29-1923, 80 y.o.   MRN: [id number]  HPI Comments: 80 year-old woman with CAD s/p CABG, diastolic dysfunction, carotid endarterectomy on the left 10 years ago, now with 40-59% carotid disease , mildly dilated left atrium, mild MR, presenting today for follow-up evaluation of her carotid arterial disease, carotid disease  In follow-up today, she reports that she continues to have severe back pain from spinal stenosis. She also continues to have stomach pain which has been a chronic issue. She sees Dr. Raul Del for her lung issues, recently started on steroid inhaler which is very expensive for her Weight is up 8 pounds from her prior clinic visit, now 150 pounds.  Lab work reviewed with her from September 2014 showing total cholesterol 148, LDL 71 She has been told by other physicians she has some crackling at her left base in the lung. Chronic mild shortness of breath, no changes. No sputum or coughing  EKG shows normal sinus rhythm with rate 65 beats minute, no significant ST or T-wave changes   Other past medical history dizziness in the past, h/o fall with right hip fracture, right arm fracture, 3 ribs injured. She had a long recovery in [place] where the injury occurred. She is now back at [facility]. Previous upper respiratory infection. 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 She has had significant pain, cortisone shots x3 to her hip now wearing a TENS unit for pain. She reports having moderate spinal stenosis.  several admissions to the hospital for abdominal pain. One was in late December 2013, any in February 2014 .  Weight was 147 pounds September 2013, down to 138 pounds in February 2014, down to 131 pounds. Weight has improved on today's visit now up to 143 pounds   hip surgery in March 2014. She had a complicated recovery with bronchitis requiring long period of rehabilitation.  She walks with a cane. Continues to have rare  abdominal pain. She did take laxatives for a short period of time, now has frequent bowel movements without laxatives.  She reports having hypoxia that her visit with [facility]. Ambulation in the office today showed saturations maintained in the mid 90s. Minimal prior smoking history She does have chronic mild shortness of breath. Continues to have significant back pain, leg pain She would like to start an exercise program at [facility]  Previous echo in 05/2009 showed normal LV function without valvular abnormalities   stress test was negative for ischemia.   carotid ultrasound last year showed mild bilateral disease  Outpatient Encounter Prescriptions as of 05/06/2014  Medication Sig  . albuterol (PROVENTIL HFA;VENTOLIN HFA) 108 (90 BASE) MCG/ACT inhaler Inhale 2 puffs into the lungs every 6 (six) hours as needed.  Marland Kitchen alendronate (FOSAMAX) 70 MG tablet Take 70 mg by mouth once a week. Take with a full glass of water on an empty stomach.  Marland Kitchen amLODipine (NORVASC) 10 MG tablet Take 10 mg by mouth daily.  Marland Kitchen aspirin 81 MG tablet Take 81 mg by mouth daily.  Marland Kitchen atorvastatin (LIPITOR) 80 MG tablet Take 1 tablet (80 mg total) by mouth daily.  . beclomethasone (BECONASE-AQ) 42 MCG/SPRAY nasal spray Place 1 spray into both nostrils daily. Dose is for each nostril.  . ciprofloxacin (CIPRO) 500 MG tablet Take 500 mg by mouth 2 (two) times daily.  . diazepam (VALIUM) 5 MG tablet Take 5 mg by mouth every 6 (six) hours as needed.    Marland Kitchen  fluticasone (FLONASE) 50 MCG/ACT nasal spray Place 2 sprays into both nostrils daily.  . Fluticasone Furoate-Vilanterol (BREO ELLIPTA IN) Inhale into the lungs daily.  . lansoprazole (PREVACID) 30 MG capsule Take 30 mg by mouth 2 (two) times daily.   Marland Kitchen levothyroxine (SYNTHROID, LEVOTHROID) 75 MCG tablet Take 75 mcg by mouth daily.    Marland Kitchen loratadine (ALLERGY RELIEF) 10 MG tablet Take 10 mg by mouth daily.  Marland Kitchen LORazepam (ATIVAN) 0.5 MG tablet Take 0.5 mg by mouth  as needed.   . metaxalone (SKELAXIN) 800 MG tablet Take 800 mg by mouth as needed for muscle spasms.  . Multiple Minerals-Vitamins (CALCIUM CITRATE +) TABS Take 1 tablet by mouth daily.    . Multiple Vitamins-Minerals (OCUVITE PRESERVISION PO) Take 1 capsule by mouth 2 (two) times daily.   . nitroGLYCERIN (NITROSTAT) 0.4 MG SL tablet Place 1 tablet (0.4 mg total) under the tongue every 5 (five) minutes as needed.  Marland Kitchen oxyCODONE-acetaminophen (PERCOCET) 10-325 MG per tablet Take 1 tablet by mouth every 4 (four) hours as needed.  . Polyethylene Glycol 3350 (MIRALAX PO) Take by mouth daily.  . sertraline (ZOLOFT) 100 MG tablet Take 150 mg by mouth daily.   Marland Kitchen tolterodine (DETROL LA) 4 MG 24 hr capsule Take 4 mg by mouth daily.    . vitamin C (ASCORBIC ACID) 250 MG tablet Take 250 mg by mouth daily.  . [DISCONTINUED] budesonide-formoterol (SYMBICORT) 80-4.5 MCG/ACT inhaler Inhale 2 puffs into the lungs 2 (two) times daily.    . [DISCONTINUED] doxycycline (VIBRAMYCIN) 100 MG capsule Take 100 mg by mouth 2 (two) times daily.   Social history  reports that she has quit smoking. She has never used smokeless tobacco. She reports that she does not drink alcohol or use illicit drugs.  Review of Systems  Respiratory: Positive for shortness of breath.   Cardiovascular: Negative.   Gastrointestinal: Positive for abdominal pain.  Musculoskeletal: Positive for back pain and gait problem.  Skin: Negative.   Neurological: Positive for weakness.  Hematological: Negative.   Psychiatric/Behavioral: Negative.   All other systems reviewed and are negative.   BP 110/52 mmHg  Pulse 65  Ht 5\' 5"  (1.651 m)  Wt 150 lb 8 oz (68.266 kg)  BMI 25.04 kg/m2  Physical Exam  Constitutional: She is oriented to person, place, and time. She appears well-developed and well-nourished.  HENT:  Head: Normocephalic.  Nose: Nose normal.  Mouth/Throat: Oropharynx is clear and moist.  Eyes: Conjunctivae are normal. Pupils are  equal, round, and reactive to light.  Neck: Normal range of motion. Neck supple. No JVD present.  Cardiovascular: Normal rate, regular rhythm, S1 normal, S2 normal, normal heart sounds and intact distal pulses.  Exam reveals no gallop and no friction rub.   No murmur heard. Trace lower extremity edema  Pulmonary/Chest: Effort normal. No respiratory distress. She has decreased breath sounds in the left lower field. She has no wheezes. She has rales. She exhibits no tenderness.  Abdominal: Soft. Bowel sounds are normal. She exhibits no distension. There is no tenderness.  Musculoskeletal: Normal range of motion. She exhibits edema. She exhibits no tenderness.  Lymphadenopathy:    She has no cervical adenopathy.  Neurological: She is alert and oriented to person, place, and time. Coordination normal.  Skin: Skin is warm and dry. No rash noted. No erythema.  Psychiatric: She has a normal mood and affect. Her behavior is normal. Judgment and thought content normal.    Assessment and Plan  Nursing note and  vitals reviewed.

## 2014-05-07 ENCOUNTER — Telehealth: Payer: Self-pay

## 2014-05-07 NOTE — Telephone Encounter (Signed)
See result note.  

## 2014-05-07 NOTE — Telephone Encounter (Signed)
Pt is returning Mandi's call, please call back after 4pm

## 2014-05-08 ENCOUNTER — Other Ambulatory Visit: Payer: Self-pay | Admitting: Cardiovascular Disease

## 2014-05-08 DIAGNOSIS — R0602 Shortness of breath: Secondary | ICD-10-CM | POA: Diagnosis not present

## 2014-05-08 DIAGNOSIS — I25719 Atherosclerosis of autologous vein coronary artery bypass graft(s) with unspecified angina pectoris: Secondary | ICD-10-CM | POA: Diagnosis not present

## 2014-05-08 DIAGNOSIS — E785 Hyperlipidemia, unspecified: Secondary | ICD-10-CM | POA: Diagnosis not present

## 2014-05-09 LAB — HEPATIC FUNCTION PANEL
ALT: 13 IU/L (ref 0–32)
AST: 21 IU/L (ref 0–40)
Albumin: 3.7 g/dL (ref 3.5–4.7)
Alkaline Phosphatase: 98 IU/L (ref 39–117)
Bilirubin, Direct: 0.13 mg/dL (ref 0.00–0.40)
Total Bilirubin: 0.5 mg/dL (ref 0.0–1.2)
Total Protein: 6 g/dL (ref 6.0–8.5)

## 2014-05-09 LAB — LIPID PANEL W/O CHOL/HDL RATIO
Cholesterol, Total: 142 mg/dL (ref 100–199)
HDL: 57 mg/dL (ref >39)
LDL Calculated: 62 mg/dL (ref 0–99)
Triglycerides: 114 mg/dL (ref 0–149)
VLDL Cholesterol Cal: 23 mg/dL (ref 5–40)

## 2014-05-11 ENCOUNTER — Ambulatory Visit (INDEPENDENT_AMBULATORY_CARE_PROVIDER_SITE_OTHER): Payer: Medicare Other | Admitting: Podiatry

## 2014-05-11 ENCOUNTER — Other Ambulatory Visit: Payer: Self-pay

## 2014-05-11 DIAGNOSIS — B351 Tinea unguium: Secondary | ICD-10-CM | POA: Diagnosis not present

## 2014-05-11 DIAGNOSIS — M79676 Pain in unspecified toe(s): Secondary | ICD-10-CM

## 2014-05-11 DIAGNOSIS — J309 Allergic rhinitis, unspecified: Secondary | ICD-10-CM | POA: Diagnosis not present

## 2014-05-11 DIAGNOSIS — E785 Hyperlipidemia, unspecified: Secondary | ICD-10-CM

## 2014-05-11 DIAGNOSIS — J019 Acute sinusitis, unspecified: Secondary | ICD-10-CM | POA: Diagnosis not present

## 2014-05-11 DIAGNOSIS — R0602 Shortness of breath: Secondary | ICD-10-CM

## 2014-05-11 DIAGNOSIS — I25719 Atherosclerosis of autologous vein coronary artery bypass graft(s) with unspecified angina pectoris: Secondary | ICD-10-CM

## 2014-05-11 LAB — BRAIN NATRIURETIC PEPTIDE: BNP: 140.4 pg/mL — ABNORMAL HIGH (ref 0.0–100.0)

## 2014-05-11 NOTE — Progress Notes (Signed)
She presents today with a chief complaint of painful elongated toenails.  Objective: Pulses are palpable bilateral. Nails are thick yellow dystrophic onychomycotic and painful palpation.  Assessment: Pain in limb secondary to onychomycosis 1 through 5 bilateral.  Plan: Debridement of nails 1 through 5 bilateral is cover service secondary to pain followup with her in 3 months. 

## 2014-05-13 ENCOUNTER — Telehealth: Payer: Self-pay | Admitting: Cardiovascular Disease

## 2014-05-13 NOTE — Telephone Encounter (Signed)
Mailed

## 2014-05-13 NOTE — Telephone Encounter (Signed)
Pt would like it if we could mail her, the lab results, please call patient when this is done.

## 2014-05-18 DIAGNOSIS — M5416 Radiculopathy, lumbar region: Secondary | ICD-10-CM | POA: Diagnosis not present

## 2014-05-18 DIAGNOSIS — M5136 Other intervertebral disc degeneration, lumbar region: Secondary | ICD-10-CM | POA: Diagnosis not present

## 2014-05-18 DIAGNOSIS — M4806 Spinal stenosis, lumbar region: Secondary | ICD-10-CM | POA: Diagnosis not present

## 2014-05-26 DIAGNOSIS — L578 Other skin changes due to chronic exposure to nonionizing radiation: Secondary | ICD-10-CM | POA: Diagnosis not present

## 2014-05-26 DIAGNOSIS — L570 Actinic keratosis: Secondary | ICD-10-CM | POA: Diagnosis not present

## 2014-05-26 DIAGNOSIS — L821 Other seborrheic keratosis: Secondary | ICD-10-CM | POA: Diagnosis not present

## 2014-05-26 DIAGNOSIS — L820 Inflamed seborrheic keratosis: Secondary | ICD-10-CM | POA: Diagnosis not present

## 2014-05-26 DIAGNOSIS — T148 Other injury of unspecified body region: Secondary | ICD-10-CM | POA: Diagnosis not present

## 2014-05-26 DIAGNOSIS — D692 Other nonthrombocytopenic purpura: Secondary | ICD-10-CM | POA: Diagnosis not present

## 2014-06-01 DIAGNOSIS — S0081XA Abrasion of other part of head, initial encounter: Secondary | ICD-10-CM | POA: Diagnosis not present

## 2014-06-01 DIAGNOSIS — J309 Allergic rhinitis, unspecified: Secondary | ICD-10-CM | POA: Diagnosis not present

## 2014-06-16 ENCOUNTER — Ambulatory Visit: Payer: Self-pay | Admitting: Family Medicine

## 2014-06-16 DIAGNOSIS — S0081XA Abrasion of other part of head, initial encounter: Secondary | ICD-10-CM | POA: Diagnosis not present

## 2014-06-16 DIAGNOSIS — J189 Pneumonia, unspecified organism: Secondary | ICD-10-CM | POA: Diagnosis not present

## 2014-06-16 DIAGNOSIS — J441 Chronic obstructive pulmonary disease with (acute) exacerbation: Secondary | ICD-10-CM | POA: Diagnosis not present

## 2014-06-16 DIAGNOSIS — J309 Allergic rhinitis, unspecified: Secondary | ICD-10-CM | POA: Diagnosis not present

## 2014-06-16 DIAGNOSIS — R079 Chest pain, unspecified: Secondary | ICD-10-CM | POA: Diagnosis not present

## 2014-06-16 DIAGNOSIS — R05 Cough: Secondary | ICD-10-CM | POA: Diagnosis not present

## 2014-06-16 DIAGNOSIS — R0989 Other specified symptoms and signs involving the circulatory and respiratory systems: Secondary | ICD-10-CM | POA: Diagnosis not present

## 2014-06-25 ENCOUNTER — Inpatient Hospital Stay: Payer: Self-pay | Admitting: Internal Medicine

## 2014-06-25 DIAGNOSIS — K5792 Diverticulitis of intestine, part unspecified, without perforation or abscess without bleeding: Secondary | ICD-10-CM | POA: Diagnosis not present

## 2014-06-25 DIAGNOSIS — K449 Diaphragmatic hernia without obstruction or gangrene: Secondary | ICD-10-CM | POA: Diagnosis not present

## 2014-06-25 DIAGNOSIS — N3281 Overactive bladder: Secondary | ICD-10-CM | POA: Diagnosis present

## 2014-06-25 DIAGNOSIS — J189 Pneumonia, unspecified organism: Secondary | ICD-10-CM | POA: Diagnosis not present

## 2014-06-25 DIAGNOSIS — R1110 Vomiting, unspecified: Secondary | ICD-10-CM | POA: Diagnosis not present

## 2014-06-25 DIAGNOSIS — R1032 Left lower quadrant pain: Secondary | ICD-10-CM | POA: Diagnosis not present

## 2014-06-25 DIAGNOSIS — I2510 Atherosclerotic heart disease of native coronary artery without angina pectoris: Secondary | ICD-10-CM | POA: Diagnosis not present

## 2014-06-25 DIAGNOSIS — R109 Unspecified abdominal pain: Secondary | ICD-10-CM | POA: Diagnosis not present

## 2014-06-25 DIAGNOSIS — E039 Hypothyroidism, unspecified: Secondary | ICD-10-CM | POA: Diagnosis present

## 2014-06-25 DIAGNOSIS — H3530 Unspecified macular degeneration: Secondary | ICD-10-CM | POA: Diagnosis present

## 2014-06-25 DIAGNOSIS — I10 Essential (primary) hypertension: Secondary | ICD-10-CM | POA: Diagnosis present

## 2014-06-25 DIAGNOSIS — J9811 Atelectasis: Secondary | ICD-10-CM | POA: Diagnosis not present

## 2014-06-25 DIAGNOSIS — R262 Difficulty in walking, not elsewhere classified: Secondary | ICD-10-CM | POA: Diagnosis not present

## 2014-06-25 DIAGNOSIS — R1013 Epigastric pain: Secondary | ICD-10-CM | POA: Diagnosis not present

## 2014-06-25 DIAGNOSIS — R531 Weakness: Secondary | ICD-10-CM | POA: Diagnosis not present

## 2014-06-25 DIAGNOSIS — D649 Anemia, unspecified: Secondary | ICD-10-CM | POA: Diagnosis not present

## 2014-06-25 DIAGNOSIS — R079 Chest pain, unspecified: Secondary | ICD-10-CM | POA: Diagnosis not present

## 2014-06-25 DIAGNOSIS — M4800 Spinal stenosis, site unspecified: Secondary | ICD-10-CM | POA: Diagnosis present

## 2014-06-25 DIAGNOSIS — I739 Peripheral vascular disease, unspecified: Secondary | ICD-10-CM | POA: Diagnosis present

## 2014-06-25 DIAGNOSIS — K5900 Constipation, unspecified: Secondary | ICD-10-CM | POA: Diagnosis not present

## 2014-06-25 DIAGNOSIS — K589 Irritable bowel syndrome without diarrhea: Secondary | ICD-10-CM | POA: Diagnosis present

## 2014-06-25 DIAGNOSIS — A419 Sepsis, unspecified organism: Secondary | ICD-10-CM | POA: Diagnosis not present

## 2014-06-25 DIAGNOSIS — K5909 Other constipation: Secondary | ICD-10-CM | POA: Diagnosis present

## 2014-06-25 DIAGNOSIS — R918 Other nonspecific abnormal finding of lung field: Secondary | ICD-10-CM | POA: Diagnosis not present

## 2014-06-25 DIAGNOSIS — M6281 Muscle weakness (generalized): Secondary | ICD-10-CM | POA: Diagnosis not present

## 2014-06-25 DIAGNOSIS — Z951 Presence of aortocoronary bypass graft: Secondary | ICD-10-CM | POA: Diagnosis not present

## 2014-06-25 DIAGNOSIS — R0602 Shortness of breath: Secondary | ICD-10-CM | POA: Diagnosis not present

## 2014-06-25 DIAGNOSIS — K5660 Unspecified intestinal obstruction: Secondary | ICD-10-CM | POA: Diagnosis not present

## 2014-06-25 DIAGNOSIS — R10817 Generalized abdominal tenderness: Secondary | ICD-10-CM | POA: Diagnosis not present

## 2014-06-25 DIAGNOSIS — J90 Pleural effusion, not elsewhere classified: Secondary | ICD-10-CM | POA: Diagnosis not present

## 2014-06-25 DIAGNOSIS — R5381 Other malaise: Secondary | ICD-10-CM | POA: Diagnosis not present

## 2014-06-25 DIAGNOSIS — R1011 Right upper quadrant pain: Secondary | ICD-10-CM | POA: Diagnosis not present

## 2014-06-25 DIAGNOSIS — K529 Noninfective gastroenteritis and colitis, unspecified: Secondary | ICD-10-CM | POA: Diagnosis present

## 2014-06-25 DIAGNOSIS — J449 Chronic obstructive pulmonary disease, unspecified: Secondary | ICD-10-CM | POA: Diagnosis not present

## 2014-06-25 DIAGNOSIS — R05 Cough: Secondary | ICD-10-CM | POA: Diagnosis not present

## 2014-06-25 DIAGNOSIS — K5641 Fecal impaction: Secondary | ICD-10-CM | POA: Diagnosis not present

## 2014-06-25 DIAGNOSIS — E785 Hyperlipidemia, unspecified: Secondary | ICD-10-CM | POA: Diagnosis present

## 2014-06-25 DIAGNOSIS — Z87891 Personal history of nicotine dependence: Secondary | ICD-10-CM | POA: Diagnosis not present

## 2014-06-25 DIAGNOSIS — I341 Nonrheumatic mitral (valve) prolapse: Secondary | ICD-10-CM | POA: Diagnosis present

## 2014-06-25 DIAGNOSIS — F329 Major depressive disorder, single episode, unspecified: Secondary | ICD-10-CM | POA: Diagnosis present

## 2014-06-25 DIAGNOSIS — R6510 Systemic inflammatory response syndrome (SIRS) of non-infectious origin without acute organ dysfunction: Secondary | ICD-10-CM | POA: Diagnosis not present

## 2014-06-25 DIAGNOSIS — R112 Nausea with vomiting, unspecified: Secondary | ICD-10-CM | POA: Diagnosis not present

## 2014-06-25 DIAGNOSIS — K219 Gastro-esophageal reflux disease without esophagitis: Secondary | ICD-10-CM | POA: Diagnosis present

## 2014-06-25 DIAGNOSIS — J209 Acute bronchitis, unspecified: Secondary | ICD-10-CM | POA: Diagnosis not present

## 2014-06-25 DIAGNOSIS — Z7982 Long term (current) use of aspirin: Secondary | ICD-10-CM | POA: Diagnosis not present

## 2014-06-25 DIAGNOSIS — Z882 Allergy status to sulfonamides status: Secondary | ICD-10-CM | POA: Diagnosis not present

## 2014-06-25 DIAGNOSIS — Z79899 Other long term (current) drug therapy: Secondary | ICD-10-CM | POA: Diagnosis not present

## 2014-06-25 DIAGNOSIS — M810 Age-related osteoporosis without current pathological fracture: Secondary | ICD-10-CM | POA: Diagnosis present

## 2014-06-25 LAB — COMPREHENSIVE METABOLIC PANEL
ALBUMIN: 3.1 g/dL — AB (ref 3.4–5.0)
ALK PHOS: 181 U/L — AB
ALT: 26 U/L
ANION GAP: 12 (ref 7–16)
BILIRUBIN TOTAL: 0.7 mg/dL (ref 0.2–1.0)
BUN: 33 mg/dL — ABNORMAL HIGH (ref 7–18)
CREATININE: 1.29 mg/dL (ref 0.60–1.30)
Calcium, Total: 9.1 mg/dL (ref 8.5–10.1)
Chloride: 109 mmol/L — ABNORMAL HIGH (ref 98–107)
Co2: 19 mmol/L — ABNORMAL LOW (ref 21–32)
EGFR (African American): 50 — ABNORMAL LOW
GFR CALC NON AF AMER: 41 — AB
GLUCOSE: 173 mg/dL — AB (ref 65–99)
OSMOLALITY: 291 (ref 275–301)
Potassium: 5 mmol/L (ref 3.5–5.1)
SGOT(AST): 49 U/L — ABNORMAL HIGH (ref 15–37)
SODIUM: 140 mmol/L (ref 136–145)
Total Protein: 6.9 g/dL (ref 6.4–8.2)

## 2014-06-25 LAB — CBC
HCT: 41.2 % (ref 35.0–47.0)
HGB: 13.2 g/dL (ref 12.0–16.0)
MCH: 29.8 pg (ref 26.0–34.0)
MCHC: 32.1 g/dL (ref 32.0–36.0)
MCV: 93 fL (ref 80–100)
Platelet: 369 10*3/uL (ref 150–440)
RBC: 4.45 10*6/uL (ref 3.80–5.20)
RDW: 15.5 % — AB (ref 11.5–14.5)
WBC: 28.3 10*3/uL — AB (ref 3.6–11.0)

## 2014-06-25 LAB — URINALYSIS, COMPLETE
Bacteria: NONE SEEN
Bilirubin,UR: NEGATIVE
Blood: NEGATIVE
GLUCOSE, UR: NEGATIVE mg/dL (ref 0–75)
Leukocyte Esterase: NEGATIVE
Nitrite: NEGATIVE
Ph: 5 (ref 4.5–8.0)
Protein: NEGATIVE
SPECIFIC GRAVITY: 1.024 (ref 1.003–1.030)
SQUAMOUS EPITHELIAL: NONE SEEN
WBC UR: 1 /HPF (ref 0–5)

## 2014-06-25 LAB — LIPASE, BLOOD: Lipase: 53 U/L — ABNORMAL LOW (ref 73–393)

## 2014-06-25 LAB — TROPONIN I

## 2014-06-26 LAB — CBC WITH DIFFERENTIAL/PLATELET
Basophil #: 0 10*3/uL (ref 0.0–0.1)
Basophil %: 0 %
EOS ABS: 0 10*3/uL (ref 0.0–0.7)
Eosinophil %: 0 %
HCT: 34.4 % — AB (ref 35.0–47.0)
HGB: 10.8 g/dL — ABNORMAL LOW (ref 12.0–16.0)
LYMPHS PCT: 2 %
Lymphocyte #: 0.4 10*3/uL — ABNORMAL LOW (ref 1.0–3.6)
MCH: 28.9 pg (ref 26.0–34.0)
MCHC: 31.5 g/dL — ABNORMAL LOW (ref 32.0–36.0)
MCV: 92 fL (ref 80–100)
MONOS PCT: 3 %
Monocyte #: 0.6 x10 3/mm (ref 0.2–0.9)
NEUTROS ABS: 17.8 10*3/uL — AB (ref 1.4–6.5)
NEUTROS PCT: 95 %
PLATELETS: 316 10*3/uL (ref 150–440)
RBC: 3.74 10*6/uL — AB (ref 3.80–5.20)
RDW: 15.6 % — AB (ref 11.5–14.5)
WBC: 18.8 10*3/uL — ABNORMAL HIGH (ref 3.6–11.0)

## 2014-06-26 LAB — BASIC METABOLIC PANEL
ANION GAP: 9 (ref 7–16)
BUN: 36 mg/dL — ABNORMAL HIGH (ref 7–18)
CHLORIDE: 107 mmol/L (ref 98–107)
CO2: 21 mmol/L (ref 21–32)
Calcium, Total: 7.4 mg/dL — ABNORMAL LOW (ref 8.5–10.1)
Creatinine: 1.33 mg/dL — ABNORMAL HIGH (ref 0.60–1.30)
EGFR (African American): 49 — ABNORMAL LOW
EGFR (Non-African Amer.): 40 — ABNORMAL LOW
GLUCOSE: 149 mg/dL — AB (ref 65–99)
OSMOLALITY: 285 (ref 275–301)
Potassium: 4.1 mmol/L (ref 3.5–5.1)
SODIUM: 137 mmol/L (ref 136–145)

## 2014-06-27 LAB — CBC WITH DIFFERENTIAL/PLATELET
BASOS ABS: 0 10*3/uL (ref 0.0–0.1)
Basophil %: 0.1 %
Eosinophil #: 0 10*3/uL (ref 0.0–0.7)
Eosinophil %: 0.1 %
HCT: 31.6 % — ABNORMAL LOW (ref 35.0–47.0)
HGB: 10 g/dL — ABNORMAL LOW (ref 12.0–16.0)
Lymphocyte #: 0.4 10*3/uL — ABNORMAL LOW (ref 1.0–3.6)
Lymphocyte %: 2.1 %
MCH: 29.2 pg (ref 26.0–34.0)
MCHC: 31.8 g/dL — AB (ref 32.0–36.0)
MCV: 92 fL (ref 80–100)
MONOS PCT: 2.6 %
Monocyte #: 0.5 x10 3/mm (ref 0.2–0.9)
Neutrophil #: 16.4 10*3/uL — ABNORMAL HIGH (ref 1.4–6.5)
Neutrophil %: 95.1 %
Platelet: 277 10*3/uL (ref 150–440)
RBC: 3.44 10*6/uL — ABNORMAL LOW (ref 3.80–5.20)
RDW: 15.8 % — AB (ref 11.5–14.5)
WBC: 17.3 10*3/uL — AB (ref 3.6–11.0)

## 2014-06-27 LAB — BASIC METABOLIC PANEL
ANION GAP: 12 (ref 7–16)
BUN: 28 mg/dL — ABNORMAL HIGH (ref 7–18)
CALCIUM: 7.3 mg/dL — AB (ref 8.5–10.1)
CO2: 18 mmol/L — AB (ref 21–32)
Chloride: 111 mmol/L — ABNORMAL HIGH (ref 98–107)
Creatinine: 0.94 mg/dL (ref 0.60–1.30)
EGFR (Non-African Amer.): 60 — ABNORMAL LOW
Glucose: 136 mg/dL — ABNORMAL HIGH (ref 65–99)
OSMOLALITY: 289 (ref 275–301)
Potassium: 3.7 mmol/L (ref 3.5–5.1)
Sodium: 141 mmol/L (ref 136–145)

## 2014-06-30 LAB — CBC WITH DIFFERENTIAL/PLATELET
BASOS PCT: 0.4 %
Basophil #: 0.1 10*3/uL (ref 0.0–0.1)
EOS ABS: 0 10*3/uL (ref 0.0–0.7)
EOS PCT: 0 %
HCT: 35.6 % (ref 35.0–47.0)
HGB: 11.2 g/dL — AB (ref 12.0–16.0)
LYMPHS ABS: 1.2 10*3/uL (ref 1.0–3.6)
Lymphocyte %: 7.9 %
MCH: 28.8 pg (ref 26.0–34.0)
MCHC: 31.4 g/dL — AB (ref 32.0–36.0)
MCV: 92 fL (ref 80–100)
Monocyte #: 0.7 x10 3/mm (ref 0.2–0.9)
Monocyte %: 4.7 %
NEUTROS ABS: 13.7 10*3/uL — AB (ref 1.4–6.5)
Neutrophil %: 87 %
Platelet: 316 10*3/uL (ref 150–440)
RBC: 3.88 10*6/uL (ref 3.80–5.20)
RDW: 15.6 % — ABNORMAL HIGH (ref 11.5–14.5)
WBC: 15.8 10*3/uL — ABNORMAL HIGH (ref 3.6–11.0)

## 2014-06-30 LAB — CULTURE, BLOOD (SINGLE)

## 2014-07-01 LAB — CREATININE, SERUM
Creatinine: 0.9 mg/dL (ref 0.60–1.30)
EGFR (African American): >60
EGFR (Non-African Amer.): >60

## 2014-07-01 LAB — TSH: Thyroid Stimulating Horm: 2.57 u[IU]/mL

## 2014-07-01 LAB — VANCOMYCIN, TROUGH: Vancomycin, Trough: 13 ug/mL (ref 10–20)

## 2014-07-02 LAB — BASIC METABOLIC PANEL
Anion Gap: 9 (ref 7–16)
BUN: 33 mg/dL — ABNORMAL HIGH (ref 7–18)
Calcium, Total: 7.9 mg/dL — ABNORMAL LOW (ref 8.5–10.1)
Chloride: 104 mmol/L (ref 98–107)
Co2: 25 mmol/L (ref 21–32)
Creatinine: 1.06 mg/dL (ref 0.60–1.30)
EGFR (African American): >60
EGFR (Non-African Amer.): 52 — ABNORMAL LOW
Glucose: 147 mg/dL — ABNORMAL HIGH (ref 65–99)
Osmolality: 286 (ref 275–301)
Potassium: 4.5 mmol/L (ref 3.5–5.1)
Sodium: 138 mmol/L (ref 136–145)

## 2014-07-02 LAB — CBC WITH DIFFERENTIAL/PLATELET
Bands: 1 %
HCT: 31 % — ABNORMAL LOW (ref 35.0–47.0)
HGB: 9.9 g/dL — AB (ref 12.0–16.0)
LYMPHS PCT: 8 %
MCH: 28.9 pg (ref 26.0–34.0)
MCHC: 31.9 g/dL — ABNORMAL LOW (ref 32.0–36.0)
MCV: 91 fL (ref 80–100)
METAMYELOCYTE: 1 %
Monocytes: 5 %
Platelet: 260 10*3/uL (ref 150–440)
RBC: 3.43 10*6/uL — ABNORMAL LOW (ref 3.80–5.20)
RDW: 15.5 % — AB (ref 11.5–14.5)
SEGMENTED NEUTROPHILS: 85 %
WBC: 13.3 10*3/uL — ABNORMAL HIGH (ref 3.6–11.0)

## 2014-07-03 LAB — VANCOMYCIN, TROUGH: Vancomycin, Trough: 14 ug/mL (ref 10–20)

## 2014-07-03 LAB — CREATININE, SERUM
Creatinine: 1.17 mg/dL (ref 0.60–1.30)
EGFR (Non-African Amer.): 46 — ABNORMAL LOW
GFR CALC AF AMER: 56 — AB

## 2014-07-05 LAB — VANCOMYCIN, TROUGH: VANCOMYCIN, TROUGH: 14 ug/mL (ref 10–20)

## 2014-07-05 LAB — CBC WITH DIFFERENTIAL/PLATELET
BASOS ABS: 0 10*3/uL (ref 0.0–0.1)
Basophil %: 0 %
EOS ABS: 0 10*3/uL (ref 0.0–0.7)
Eosinophil %: 0 %
HCT: 27.4 % — AB (ref 35.0–47.0)
HGB: 8.6 g/dL — AB (ref 12.0–16.0)
Lymphocyte #: 0.5 10*3/uL — ABNORMAL LOW (ref 1.0–3.6)
Lymphocyte %: 4 %
MCH: 28.8 pg (ref 26.0–34.0)
MCHC: 31.3 g/dL — ABNORMAL LOW (ref 32.0–36.0)
MCV: 92 fL (ref 80–100)
Monocyte #: 0.4 x10 3/mm (ref 0.2–0.9)
Monocyte %: 2.8 %
NEUTROS PCT: 93.2 %
Neutrophil #: 12.1 10*3/uL — ABNORMAL HIGH (ref 1.4–6.5)
Platelet: 242 10*3/uL (ref 150–440)
RBC: 2.99 10*6/uL — AB (ref 3.80–5.20)
RDW: 15.9 % — ABNORMAL HIGH (ref 11.5–14.5)
WBC: 13 10*3/uL — AB (ref 3.6–11.0)

## 2014-07-06 DIAGNOSIS — R262 Difficulty in walking, not elsewhere classified: Secondary | ICD-10-CM | POA: Diagnosis not present

## 2014-07-06 DIAGNOSIS — M6281 Muscle weakness (generalized): Secondary | ICD-10-CM | POA: Diagnosis not present

## 2014-07-06 DIAGNOSIS — K5641 Fecal impaction: Secondary | ICD-10-CM | POA: Diagnosis not present

## 2014-07-06 DIAGNOSIS — I2510 Atherosclerotic heart disease of native coronary artery without angina pectoris: Secondary | ICD-10-CM | POA: Diagnosis not present

## 2014-07-06 DIAGNOSIS — E039 Hypothyroidism, unspecified: Secondary | ICD-10-CM | POA: Diagnosis not present

## 2014-07-06 DIAGNOSIS — J449 Chronic obstructive pulmonary disease, unspecified: Secondary | ICD-10-CM | POA: Diagnosis not present

## 2014-07-06 DIAGNOSIS — J189 Pneumonia, unspecified organism: Secondary | ICD-10-CM | POA: Diagnosis not present

## 2014-07-06 DIAGNOSIS — J181 Lobar pneumonia, unspecified organism: Secondary | ICD-10-CM | POA: Diagnosis not present

## 2014-07-06 DIAGNOSIS — R531 Weakness: Secondary | ICD-10-CM | POA: Diagnosis not present

## 2014-07-06 DIAGNOSIS — K5900 Constipation, unspecified: Secondary | ICD-10-CM | POA: Diagnosis not present

## 2014-07-06 DIAGNOSIS — I10 Essential (primary) hypertension: Secondary | ICD-10-CM | POA: Diagnosis not present

## 2014-07-06 DIAGNOSIS — K5909 Other constipation: Secondary | ICD-10-CM | POA: Diagnosis not present

## 2014-07-06 DIAGNOSIS — R1013 Epigastric pain: Secondary | ICD-10-CM | POA: Diagnosis not present

## 2014-07-07 DIAGNOSIS — K5909 Other constipation: Secondary | ICD-10-CM | POA: Diagnosis not present

## 2014-07-07 DIAGNOSIS — J449 Chronic obstructive pulmonary disease, unspecified: Secondary | ICD-10-CM | POA: Diagnosis not present

## 2014-07-07 DIAGNOSIS — I2510 Atherosclerotic heart disease of native coronary artery without angina pectoris: Secondary | ICD-10-CM | POA: Diagnosis not present

## 2014-07-07 DIAGNOSIS — F419 Anxiety disorder, unspecified: Secondary | ICD-10-CM

## 2014-07-07 DIAGNOSIS — J181 Lobar pneumonia, unspecified organism: Secondary | ICD-10-CM | POA: Diagnosis not present

## 2014-07-07 DIAGNOSIS — I10 Essential (primary) hypertension: Secondary | ICD-10-CM

## 2014-07-20 DIAGNOSIS — J449 Chronic obstructive pulmonary disease, unspecified: Secondary | ICD-10-CM | POA: Diagnosis not present

## 2014-07-20 DIAGNOSIS — K5900 Constipation, unspecified: Secondary | ICD-10-CM | POA: Diagnosis not present

## 2014-07-20 DIAGNOSIS — I10 Essential (primary) hypertension: Secondary | ICD-10-CM | POA: Diagnosis not present

## 2014-07-20 DIAGNOSIS — J189 Pneumonia, unspecified organism: Secondary | ICD-10-CM | POA: Diagnosis not present

## 2014-07-23 DIAGNOSIS — M6281 Muscle weakness (generalized): Secondary | ICD-10-CM | POA: Diagnosis not present

## 2014-07-23 DIAGNOSIS — J189 Pneumonia, unspecified organism: Secondary | ICD-10-CM | POA: Diagnosis not present

## 2014-07-23 DIAGNOSIS — R262 Difficulty in walking, not elsewhere classified: Secondary | ICD-10-CM | POA: Diagnosis not present

## 2014-07-24 DIAGNOSIS — R262 Difficulty in walking, not elsewhere classified: Secondary | ICD-10-CM | POA: Diagnosis not present

## 2014-07-24 DIAGNOSIS — J189 Pneumonia, unspecified organism: Secondary | ICD-10-CM | POA: Diagnosis not present

## 2014-07-24 DIAGNOSIS — M6281 Muscle weakness (generalized): Secondary | ICD-10-CM | POA: Diagnosis not present

## 2014-07-28 DIAGNOSIS — J189 Pneumonia, unspecified organism: Secondary | ICD-10-CM | POA: Diagnosis not present

## 2014-07-28 DIAGNOSIS — R262 Difficulty in walking, not elsewhere classified: Secondary | ICD-10-CM | POA: Diagnosis not present

## 2014-07-28 DIAGNOSIS — M6281 Muscle weakness (generalized): Secondary | ICD-10-CM | POA: Diagnosis not present

## 2014-07-29 DIAGNOSIS — J189 Pneumonia, unspecified organism: Secondary | ICD-10-CM | POA: Diagnosis not present

## 2014-07-29 DIAGNOSIS — R262 Difficulty in walking, not elsewhere classified: Secondary | ICD-10-CM | POA: Diagnosis not present

## 2014-07-29 DIAGNOSIS — M6281 Muscle weakness (generalized): Secondary | ICD-10-CM | POA: Diagnosis not present

## 2014-07-30 DIAGNOSIS — R262 Difficulty in walking, not elsewhere classified: Secondary | ICD-10-CM | POA: Diagnosis not present

## 2014-07-30 DIAGNOSIS — M6281 Muscle weakness (generalized): Secondary | ICD-10-CM | POA: Diagnosis not present

## 2014-07-30 DIAGNOSIS — J189 Pneumonia, unspecified organism: Secondary | ICD-10-CM | POA: Diagnosis not present

## 2014-07-31 DIAGNOSIS — F4320 Adjustment disorder, unspecified: Secondary | ICD-10-CM | POA: Diagnosis not present

## 2014-07-31 DIAGNOSIS — K5909 Other constipation: Secondary | ICD-10-CM | POA: Diagnosis not present

## 2014-07-31 DIAGNOSIS — J189 Pneumonia, unspecified organism: Secondary | ICD-10-CM | POA: Diagnosis not present

## 2014-07-31 DIAGNOSIS — J441 Chronic obstructive pulmonary disease with (acute) exacerbation: Secondary | ICD-10-CM | POA: Diagnosis not present

## 2014-08-04 DIAGNOSIS — J189 Pneumonia, unspecified organism: Secondary | ICD-10-CM | POA: Diagnosis not present

## 2014-08-04 DIAGNOSIS — R262 Difficulty in walking, not elsewhere classified: Secondary | ICD-10-CM | POA: Diagnosis not present

## 2014-08-04 DIAGNOSIS — M6281 Muscle weakness (generalized): Secondary | ICD-10-CM | POA: Diagnosis not present

## 2014-08-05 DIAGNOSIS — J189 Pneumonia, unspecified organism: Secondary | ICD-10-CM | POA: Diagnosis not present

## 2014-08-05 DIAGNOSIS — M6281 Muscle weakness (generalized): Secondary | ICD-10-CM | POA: Diagnosis not present

## 2014-08-05 DIAGNOSIS — R262 Difficulty in walking, not elsewhere classified: Secondary | ICD-10-CM | POA: Diagnosis not present

## 2014-08-07 DIAGNOSIS — J189 Pneumonia, unspecified organism: Secondary | ICD-10-CM | POA: Diagnosis not present

## 2014-08-07 DIAGNOSIS — R262 Difficulty in walking, not elsewhere classified: Secondary | ICD-10-CM | POA: Diagnosis not present

## 2014-08-07 DIAGNOSIS — M6281 Muscle weakness (generalized): Secondary | ICD-10-CM | POA: Diagnosis not present

## 2014-08-10 ENCOUNTER — Ambulatory Visit (INDEPENDENT_AMBULATORY_CARE_PROVIDER_SITE_OTHER): Payer: Medicare Other | Admitting: Podiatry

## 2014-08-10 DIAGNOSIS — B351 Tinea unguium: Secondary | ICD-10-CM | POA: Diagnosis not present

## 2014-08-10 DIAGNOSIS — M79676 Pain in unspecified toe(s): Secondary | ICD-10-CM | POA: Diagnosis not present

## 2014-08-10 NOTE — Progress Notes (Signed)
She presents today with a chief complaint of painful elongated toenails.  Objective: Pulses are palpable bilateral. Nails are thick yellow dystrophic onychomycotic and painful palpation.  Assessment: Pain in limb secondary to onychomycosis 1 through 5 bilateral.  Plan: Debridement of nails 1 through 5 bilateral is cover service secondary to pain followup with her in 3 months.

## 2014-08-11 DIAGNOSIS — J189 Pneumonia, unspecified organism: Secondary | ICD-10-CM | POA: Diagnosis not present

## 2014-08-11 DIAGNOSIS — R262 Difficulty in walking, not elsewhere classified: Secondary | ICD-10-CM | POA: Diagnosis not present

## 2014-08-11 DIAGNOSIS — M6281 Muscle weakness (generalized): Secondary | ICD-10-CM | POA: Diagnosis not present

## 2014-08-12 DIAGNOSIS — M5416 Radiculopathy, lumbar region: Secondary | ICD-10-CM | POA: Diagnosis not present

## 2014-08-12 DIAGNOSIS — M5136 Other intervertebral disc degeneration, lumbar region: Secondary | ICD-10-CM | POA: Diagnosis not present

## 2014-08-12 DIAGNOSIS — M4806 Spinal stenosis, lumbar region: Secondary | ICD-10-CM | POA: Diagnosis not present

## 2014-08-14 DIAGNOSIS — J449 Chronic obstructive pulmonary disease, unspecified: Secondary | ICD-10-CM | POA: Diagnosis not present

## 2014-08-14 DIAGNOSIS — J8410 Pulmonary fibrosis, unspecified: Secondary | ICD-10-CM | POA: Diagnosis not present

## 2014-08-14 DIAGNOSIS — R938 Abnormal findings on diagnostic imaging of other specified body structures: Secondary | ICD-10-CM | POA: Diagnosis not present

## 2014-08-14 DIAGNOSIS — Z0389 Encounter for observation for other suspected diseases and conditions ruled out: Secondary | ICD-10-CM | POA: Diagnosis not present

## 2014-08-21 ENCOUNTER — Telehealth: Payer: Self-pay | Admitting: *Deleted

## 2014-08-21 NOTE — Telephone Encounter (Signed)
Spoke with patient and explained that she is due for a Carotid u/s (1 yr f/u).

## 2014-08-25 ENCOUNTER — Other Ambulatory Visit: Payer: Self-pay | Admitting: Radiology

## 2014-08-25 DIAGNOSIS — I6523 Occlusion and stenosis of bilateral carotid arteries: Secondary | ICD-10-CM

## 2014-09-14 DIAGNOSIS — M5416 Radiculopathy, lumbar region: Secondary | ICD-10-CM | POA: Diagnosis not present

## 2014-09-14 DIAGNOSIS — M4806 Spinal stenosis, lumbar region: Secondary | ICD-10-CM | POA: Diagnosis not present

## 2014-09-21 ENCOUNTER — Encounter (INDEPENDENT_AMBULATORY_CARE_PROVIDER_SITE_OTHER): Payer: Medicare Other

## 2014-09-21 DIAGNOSIS — I6523 Occlusion and stenosis of bilateral carotid arteries: Secondary | ICD-10-CM | POA: Diagnosis not present

## 2014-10-05 DIAGNOSIS — R350 Frequency of micturition: Secondary | ICD-10-CM | POA: Diagnosis not present

## 2014-10-05 DIAGNOSIS — R159 Full incontinence of feces: Secondary | ICD-10-CM | POA: Diagnosis not present

## 2014-10-05 DIAGNOSIS — J189 Pneumonia, unspecified organism: Secondary | ICD-10-CM | POA: Diagnosis not present

## 2014-10-05 DIAGNOSIS — J441 Chronic obstructive pulmonary disease with (acute) exacerbation: Secondary | ICD-10-CM | POA: Diagnosis not present

## 2014-10-06 NOTE — Discharge Summary (Signed)
PATIENT NAME:  Chelsea [name], Chelsea [name] MR#:  [id number] DATE OF BIRTH:  [birth year]  PRIMARY CARE PHYSICIAN:   Margarita Rana, MD   CONSULTATIONS:  Thornton Park, MD  DISCHARGE DIAGNOSES: 1.  Right hip avascular necrosis.  2.  Urinary tract infection.  3.  Dehydration.  4.  Hypertension.  5.  Coronary artery disease.   CONDITION: Stable.   CODE STATUS:  DO NOT RESUSCITATE.   MEDICATIONS: Please refer to the medication reconciliation list in the [facility] discharge instructions.   DIET: Low sodium diet.   ACTIVITY: As tolerated.   FOLLOW-UP CARE:  Follow-up with PCP within 1 to 2 weeks, follow up with Dr. Marry Guan, orthopedic surgeon, within 1 to 2 weeks.   REASON FOR ADMISSION:  Right hip pain.   HOSPITAL COURSE: The patient is an 81 year old Caucasian female with a history of osteoporosis with right hip pain who presented to the ED with right hip pain for 10 days with radiation to median side as well as her abdomen.  For a detailed history and physical examination, please refer to the admission note dictated by Dr. Levonne Hubert.  In the ED the patient had a CT scan of pelvis which showed concerns for avascular necrosis of the right hip.  X-ray did not show any acute bony abnormality of pelvis or right hip.  The patient was admitted for right hip pain, possibly due to avascular necrosis.   After admission the patient was evaluated by Dr. Mack Guise.   He suggested the patient does not need surgery at this time.  Need to follow-up with Dr. Marry Guan as outpatient with pain medications.   For UTI, the patient's urinalysis showed UTI. She was treated with Rocephin IV but the patient has no symptoms.   For dehydration, the patient's BNP was 28.  She was treated with IV fluid support and it decreased to 19.  The patient is clinically stable and was discharged to home yesterday.  Discussed the discharge plan with the patient and case manager.  TIME SPENT:  About 33  minutes.    ____________________________ Demetrios Loll, MD qc:ct D: 06/10/2012 15:35:00 ET T: 06/11/2012 08:57:40 ET JOB#: [id number]  cc: Demetrios Loll, MD, <Dictator> Demetrios Loll MD ELECTRONICALLY SIGNED 06/12/2012 16:09

## 2014-10-06 NOTE — Consult Note (Signed)
PATIENT NAME:  Chelsea [name], Chelsea [name] MR#:  [id number] DATE OF BIRTH:  1923/12/11  DATE OF CONSULTATION:  06/08/2012  REFERRING PHYSICIAN:   CONSULTING PHYSICIAN:  Timoteo Gaul, MD  REASON FOR CONSULTATION:  Right hip pain.   HISTORY OF PRESENT ILLNESS:  The patient was admitted to the hospitalist service for complaints of right hip pain. She states that the pain has been present for approximately 10 days ago but began in the absence of trauma. The pain she describes as primarily medial and radiates down to her knee. She occasionally has pain in the groin as well as laterally. She has known spinal stenosis and is followed by Dr. Sharlet Salina at [facility]. He has given her previous corticosteroid injections for this condition. The patient had her femoral neck fracture treated with 3 cannulated screws in [place] approximately 15 months ago. She is now a resident of [place]. She has already followed up with Dr. Marry Guan in regards to her right hip. The patient states today that her pain has improved while in the hospital with pain medications. She was out walking with physical therapy earlier today by her report.   PAST MEDICAL HISTORY:  Reviewed today from her history and physical.   MEDICATIONS:  Reviewed today from her history and physical.   ALLERGIES:  Reviewed today from her history and physical.   PHYSICAL EXAMINATION:  GENERAL APPEARANCE: The patient is alert, sitting up in bed. She does not appear uncomfortable.  EXTREMITIES:  She does have medial-sided thigh pain with leg rolling. She has tenderness over the medial thigh as well which extends into the medial joint line of the knee. She does not have lower leg tenderness. She has palpable pedal pulses and intact sensation to light touch. The patient has palpable pedal pulses.   The patient had mild discomfort in the hip, but more so in the medial thigh with attempted knee flexion. She does not have severe knee pain. The patient has mild  tenderness over the greater trochanter laterally. She has minimal tenderness to palpation in the groin specifically. When the hip is gently internally and externally rotated with the hip flexed 90 degrees, she does complain of mild right hip pain. There is no obvious deformity to the lower extremities. There is no significant leg length discrepancy.   RADIOLOGY: I reviewed the plain films of the right hip as well as the CAT scan performed in the ER. I requested coronal and sagittal reconstructions to be added to the study. These images demonstrate the 3 cannulated screws are crossing the fracture site. The screws are backing out laterally by a few millimeters. There was no signs of new fracture. The radiologist reports a serpiginous signal within the femoral head which may be consistent with avascular necrosis.   ASSESSMENT: Right hip and medial thigh pain.   PLAN: The patient has multiple etiologies which could be contributing to her pain. The most intense pain is in the medial thigh and is tender to palpation. This is not typical for AVN and may be musculoskeletal strain or muscle spasm. It is possible that spinal stenosis is also causing her symptoms. She states that she has pain in the left leg as well but to a lesser degree. The patient has been able to ambulate today. She may have some AVN in the femoral head as this is a potential complication to a femoral neck fracture. She, however, has not had advanced AVN as there is no evidence of femoral head collapse yet  on her films. The patient has medial tenderness in her knee. This may indicate osteoarthritis which may be radiating proximally into her thigh. I am going to order a set of knee films to check for osteoarthritis. I would also recommend starting an anti-inflammatory such as Celebrex for her pain and likely inflammation. The patient's screws are beginning to back out and may require surgical removal if they become a constant source of pain for her.  Her previous fracture appears well healed. The patient may follow-up with Dr. Marry Guan as he has begun following her already for her right hip. Dr. Sharlet Salina should also follow her for her spinal stenosis. The patient should follow up with Dr. Sharlet Salina and Dr. Marry Guan in the next few weeks following discharge.     ____________________________ Timoteo Gaul, MD klk:cs D: 06/08/2012 13:29:00 ET T: 06/09/2012 18:36:31 ET JOB#: [id number]  cc: Timoteo Gaul, MD, <Dictator> Timoteo Gaul MD ELECTRONICALLY SIGNED 06/11/2012 13:52

## 2014-10-06 NOTE — Consult Note (Signed)
Brief Consult Note: Diagnosis: Right lower leg pain.   Patient was seen by consultant.   Consult note dictated.   Recommend further assessment or treatment.   Comments: Patient has multiple reasons for potential lower extremity pain including previous ORIF for femoral neck fracture, possible AVN of femoral head, prominient hardware right hip, spinal stenosis and possible knee OA.  I am ordering plain films of the patient's knees.  I recommend starting an oral antiinflammatory (i.e. Celebrex) and continued PT for now.  No history of trauma, so fracture is unlikely.  Patient has been followed by Dr. Marry Guan for her hip already and Dr. Sharlet Salina for her back.  She will not require any surgical intervention while an inpatient.  She can follow-up with Dr. Marry Guan and [facility] after discharge.  Electronic Signatures: Thornton Park (MD)  (Signed 21-Dec-13 13:20)  Authored: Brief Consult Note   Last Updated: 21-Dec-13 13:20 by Thornton Park (MD)

## 2014-10-09 NOTE — Op Note (Signed)
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   POSTOPERATIVE DIAGNOSIS: Avascular necrosis of the right femoral head status post percutaneous pinning for right femoral neck fracture (pathology pending).   PROCEDURE PERFORMED:  Conversion of previous right hip surgery to right total hip arthroplasty with removal of three 7.3 mm cannulated screws and washers.   SURGEON: Laurice Record. Hooten, MD  ASSISTANT:  Vance Peper, PA.   ANESTHESIA: Spinal.   ESTIMATED BLOOD LOSS: 150 mL.   FLUIDS REPLACED: 800 mL of crystalloid.   DRAINS: Two medium drains to Hemovac reservoir.   IMPLANTS UTILIZED: DePuy 15 mm small stature AML femoral component, 52 mm outer diameter Pinnacle Gription Sector acetabular component, +4 mm 10 degree Pinnacle Marathon polyethylene liner, and a 36 mm M-Spec femoral head with a -2 mm neck length.   INDICATIONS FOR SURGERY: The patient is an 81 year old female who underwent percutaneous pinning of right femoral neck fracture by a physician in [place] approximately 2 years ago. She was having increasing right groin and hip pain and had noticeable limb length discrepancy. Work-up was consistent with avascular necrosis of the right femoral head. Pain had increased to the point where she was having difficulty with ambulation. After discussion of the risks and benefits of surgical intervention, the patient expressed understanding of the risks and benefits and agreed with plans for surgical intervention.   PROCEDURE IN DETAIL: The patient was brought into the operating room and, after adequate spinal anesthesia was achieved, the patient was placed in the left lateral decubitus position. Axillary roll was placed and all bony prominences were well padded. The patient's right hip and leg were  cleaned and prepped with alcohol and DuraPrep and draped in the usual sterile fashion. Multiple ecchymotic areas were visualized along the leg. A "timeout" was performed as per usual protocol. A lateral curvilinear incision was made gently curving towards the posterior superior iliac spine. IT band was incised in line with the skin incision and the fibers of the gluteus maximus were split in line. Two of the 3 screw heads were protruding through the fascia of the lateralis. These screws were removed without difficulty and the accompanying washers also removed. The third inferior screw head was palpable and fascia was split and the screw was removed as was the washer without difficulty. Next, the piriformis tendon was identified and incised at its insertion, on the proximal femur. In a similar fashion, short external rotators were incised and reflected posteriorly. A T-type posterior capsulotomy was performed. Prior to dislocation of the femoral head, a threaded Steinmann pin was inserted through a separate stab incision into the pelvis superior to the acetabulum and bent in the form of a stylus so as to assess limb length and hip offset throughout the procedure. The femoral head was then dislocated posteriorly. There was slight collapse appreciated at the neck, although gross nonunion was not appreciated. The articular cartilage was somewhat soft with some mild degenerative changes appreciated. The femoral neck cut was performed using an oscillating saw. The anterior capsule was elevated off of the femoral neck. Inspection of the acetabulum demonstrated some degenerative changes. The remnant of the labrum was excised. The acetabulum was reamed in a sequential fashion up to a 51 mm diameter. Excellent bleeding bone was encountered. The quality of the bone was somewhat soft centrally,  but good supportive dome was appreciated. A 52 mm outer diameter Pinnacle Gription sector cup was positioned and impacted into place.  Excellent scratch fit was appreciated. A +4 mm neutral polyethylene was inserted and attention was directed to the proximal femur. Pilot hole for reaming of the proximal femoral canal was created using a high-speed bur. Proximal femoral canal was reamed in sequential fashion up to a 14.5 mm diameter. This allowed for between 4 and 5 cm of scratch fit. The proximal femur was then prepared using a 50 mm aggressive side-biting reamer. Serial broaches were inserted up to a 50 mm small stature broach. The calcar region was planed and trial reduction was performed with first a +1 and subsequently a -2 mm neck length. Reasonably good stability was appreciated and good return of equalization of limb lengths was appreciated. The trials were removed and the neutral polyethylene was replaced with a 10 degree face changing liner with the high side positioned at approximately the 8 o'clock position. Trial reduction was again performed with good equalization of limb lengths and improved stability noted. The trials were removed. The metal shell was irrigated with copious amounts of normal saline with antibiotic solution and suctioned dry. A +4 mm 10 degree Pinnacle Marathon polyethylene liner was positioned with the high side at approximately the 8 o'clock position and then impacted into place. Next, a 15 mm AML small stature femoral component was positioned and impacted into place. Excellent scratch fit was appreciated. Trial reduction was again performed with a -2 mm neck segment with good equalization of limb length and hip offset and excellent stability, both anteriorly and posteriorly. Trial hip ball was removed. The Morse taper was cleaned and dried. A 36 mm outer diameter M-Spec femoral head with a -2 mm neck length was placed on the trunnion and impacted into place. The hip was reduced and placed through range of motion with excellent stability appreciated. The wound was irrigated with copious amounts of normal saline with  antibiotic solution using pulsatile lavage and then suctioned dry. Good hemostasis was appreciated. The posterior capsulotomy was repaired using #5 Ethibond. The piriformis tendon was reapproximated on the undersurface of the gluteus medius tendon using #5 Ethibond with good restoration of the posterior structure. Two medium drains were placed in the wound bed and brought out through a separate stab incision to be attached to a reinfusion system. The IT band was repaired using interrupted sutures of #1 Vicryl. The subcutaneous tissue was approximated in layers using first #0 Vicryl followed by #2-0 Vicryl. Skin was closed with skin staples. A sterile dressing was applied.   The patient tolerated the procedure well. She was transported to the recovery room in stable condition. ____________________________ Laurice Record. Holley Bouche., MD jph:sb D: 09/02/2012 11:20:03 ET T: 09/02/2012 11:57:04 ET JOB#: [id number]  cc: Jeneen Rinks P. Holley Bouche., MD, <Dictator> Laurice Record Holley Bouche MD ELECTRONICALLY SIGNED 09/03/2012 20:47

## 2014-10-09 NOTE — Discharge Summary (Signed)
PATIENT NAME:  Chelsea [name], Chelsea [name] MR#:  [id number] DATE OF BIRTH:  1923-09-08  DATE OF ADMISSION:  09/02/2012 DATE OF DISCHARGE:  09/06/2012  ADMITTING DIAGNOSIS: Avascular necrosis of right femoral head status post percutaneous pinning for right femoral neck fracture.   DISCHARGE DIAGNOSIS: Avascular necrosis of the right femoral head status post percutaneous pinning of right femoral neck fracture.   PROCEDURE PERFORMED: Conversion of previous right total hip surgery to right total hip arthroplasty with removal of three 7.0 mm cannulated screws and washers.   SURGEON: Laurice Record. Hooten, M.D.   ASSISTANT: Marylee Floras, PA-C  ANESTHESIA: Spinal.   ESTIMATED BLOOD LOSS: 150 mL.  FLUIDS REPLACED: 800 mL of crystalloid.   DRAINS: Two medium drains to Hemovac reservoir.   HISTORY: The patient is an 81 year old who presents today for history and physical. She is to undergo a right total hip arthroplasty on 09/02/2012. She had previously underwent a right total hip percutaneous pinning of a femoral neck fracture performed in [place] in 2012.  She did well following that surgery.  However, she has since began having progressive right hip pain and groin pain. She has gotten to the point where she is now using a walker for ambulation. She is having difficulty with range of motion of her hip. The patient states that her pain has been aggravating her weight bearing activities. She did have a CT scan of the pelvis performed on 06/07/2012 which demonstrated patchy sclerosis with serpiginous areas in the right femoral neck suggestive of an avascular nephrosis.    PHYSICAL EXAMINATION: GENERAL: Well developed, well-nourished female in no acute distress. Antalgic gait. Abductor lurch.  HEENT: Atraumatic, normocephalic. Pupils equal, round, and reactive to light. Extraocular motion is intact. Sclerae are clear. Oropharynx is clear with moist mucosa.  NECK: Supple, nontender and with good range of motion. No  thyromegaly, adenopathy, JVD or carotid bruits.  LUNGS: Clear to auscultation bilaterally.  CARDIOVASCULAR: Regular rate and rhythm. Normal S1, S2. No murmur. No appreciable gallops or rubs. Peripheral pulses are palpable. No lower extremity edema. Homans test is negative.  ABDOMEN: Soft, nontender and nondistended. Bowel sounds are present.  SPINE: Good range of motion with both flexion and extension. No gross scoliosis. SI joints are nontender. No paraspinous tenderness. Flip test is negative.  EXTREMITIES: Good strength, stability and range of motion of the upper extremities. Good range of motion of the knees and ankles.  RIGHT HIP: Leg lengths are of equal length. There is no soft tissue swelling, erythema or tenderness. She has no crepitus on range of motion. She has no tenderness over the greater trochanter and has severe pain elicited by axial compression or extremes of rotation. There is no atrophy noted and she is neurovascularly intact in the right lower extremity.   HOSPITAL COURSE: The patient was admitted to the hospital on 09/02/2012. She had surgery that same day and was brought to the orthopedic floor from the PAC-U in stable condition. On postoperative day 1, the patient was found to have an acute postop blood loss anemia with a hemoglobin of 6.6. The patient was transfused 1 unit. Her vital signs remained stable. She did have some dizziness with physical therapy. On postoperative day 2, hemoglobin was up to 7.7, status post 1 unit of packed red blood cells. She remained dizzy with physical therapy. Her vital signs were stable. On postoperative day 3, hemoglobin was up to 8.1 and later in the day it fell to 7.9 so she was again transfused 1  unit. Status post transfusion the patient felt much better as far as her dizziness and lightheadedness. She tolerated therapy better. Her vital signs remained stable. On postoperative day 4, 09/06/2012, the patient remained asymptomatic. She was doing  well and feeling much better status post transfusion. Hemoglobin was 9.7, vital signs were stable, and she was ready for discharge to [facility] to undergo physical therapy.   CONDITION AT DISCHARGE: Stable.   DISCHARGE INSTRUCTIONS: You may gradually increase weight-bearing on the affected extremity. Posterior hip precautions. Thigh-high TED hose on both legs and remove 1 hour per 8 hour shift. Incentive spirometry every hour while awake and encourage cough and deep breathing. You may resume a regular diet as tolerated. Apply an ice pack to the affected area. Do not get the dressing or bandage wet or dirty. Call [facility] orthopedics if the dressing gets water under it. Leave the dressing on. Call [facility] orthopedics if any of the following occur: Bright red bleeding from the incision or wound, fever above 101.5 degrees, redness, swelling, or drainage at the incision. Call [facility] orthopedics if you experience any increased leg pain, numbness or weakness in the legs, or bowel or bladder symptoms.  You have follow up [place] in 2 weeks to see Dr. Skip Estimable.   DISCHARGE MEDICATIONS: 1.  Detrol LA 4 mg oral capsule extended release 1 orally once a day at bedtime. 2.  Sertraline 100 mg oral tablet 1-1/2 tabs orally once a day in the evening. 3.  Diazepam 0.5 mg oral tablet as needed for anxiety and nervousness.  4.  MiraLax oral powder for reconstitution 1 dose orally mix with water 2 times a day as needed.  5.  Spiriva 18 mcg inhalation capsule 1 cap inhaled once a day in the morning.  6.  Lansoprazole 30 mL oral delayed-release capsule 1 cap orally 2 times a day.  7.  PreserVision 1 tablet orally 2 times a day. 8.  Symbicort 160 mcg/4.5 mcg inhalation aerosol 2 puffs inhaled 2 times a day.  9.  Multivitamin 1 tablet orally once a day in the morning.  10.  Atorvastatin 80 mg oral tablet 1 tablet orally once a day at bedtime.  11.  Amlodipine 10 mg oral  tablet 1 tablet orally once a day in the morning.  12.  Mupirocin 2% topical cream apply topically to abrasion left foot 2 times a day. 13.  Imodium AD 2 mg oral tablet 1 tablet orally every 4 hours as needed for diarrhea.  14.  Align 4 mg oral capsule 1 cap orally once a day in the morning.  15.  Levothyroxine 75 mcg/0.75 mg oral tablet 1 tablet orally once a day in the morning.  16. Bisacodyl 5 mg oral delayed release tablet 1 tablet orally once a day as needed. 17.  BI photobacterium infantis 4 mg oral capsule 1 cap orally once a day. 18.  Tylenol 500 mg oral tablet 1 cap orally every 4 hours as needed for pain or temperature greater than 100.4. 19.  Oxycodone 5 mg oral tablet 1 tablet orally every 4 hours as needed for pain. 20.  Tramadol 50 mg oral tablet 1 tablet orally every 4 hours as needed for pain. 21.  Magnesium hydroxide 8% oral suspension 30 mL orally 2 times a day as needed for constipation.       22.  Aluminum hydroxide/magnesium hydroxide/simethicone 400 mg/400 mg/40 mg/5 mL oral suspension 30 mL orally every 6 hours as needed  for indigestion or heartburn.  23.  Lovenox 40 mg subcutaneous once a day for 14 days.  ____________________________ Duanne Guess, PA-C tcg:sb D: 09/06/2012 07:53:00 ET T: 09/06/2012 08:46:54 ET JOB#: [id number]  cc: Duanne Guess, PA-C, <Dictator> Duanne Guess PA ELECTRONICALLY SIGNED 09/18/2012 12:32

## 2014-10-09 NOTE — H&P (Signed)
PATIENT NAME:  Chelsea [name], SHELVIN MR#:  [id number] DATE OF BIRTH:  07/13/1923  PRIMARY CARE PHYSICIAN: Margarita Rana, MD  CODE STATUS: DO NOT RESUSCITATE/DO NOT INTUBATE. Healthcare power of attorney is her daughter.   CHIEF COMPLAINT: Right hip pain.   HISTORY OF PRESENT ILLNESS: An 81 year old female with past medical history for osteoporosis presents with right hip pain. She notes that she was in her usual state of health until about 10 days ago when she started to develop right-sided hip pain with radiation to her medial thigh as well as to her abdomen. The pain is constant, it is a sharp pain. And severe. Intermittently it is stabbing and dull. She could not find any alleviating factors. She denies any trauma. She notes that she had a fall six weeks ago. Due to her symptoms she presented to her podiatrist and received oxycodone every four hours as well as Flexeril as it was presumed to be musculoskeletal pain. Despite this intervention,  the pain continued to worsen until on the 20th, which is the day of presentation, she presented to her primary care provider and was evaluated. She was also noted to have some dizziness. So her primary care provider reviewed the records that accompanied the patient. She denied any fevers, vaginal discharge, nausea, vomiting, diarrhea, constipation, dysuria. She only admitted to some dizziness that would be intermittent in nature and last 30 minutes. The dizziness was characterized as lightheadedness and some balance dysfunction. She was advised to present to the Emergency Department. Upon evaluation in the Emergency Department, CT of the pelvis showed concerns for avascular necrosis of the right hip so we are called for admission.   The patient resides at [facility]. She is independent with her activities of daily living. She uses a walker as well as a cane for ambulation. The cane she used at home and the walker she uses when she goes out.   The  patient notes that she usually gets hip injections by her podiatrist; however, her next shot was not due until January. She notes that the last steroid shot was about a month ago. She has also received epidural injections as well. She cannot tell me the frequency that she received these steroid injections of the hip.   PAST MEDICAL HISTORY:  1. Hypertension.  2. Adjustment disorder.  3. Allergic rhinitis.  4. Anemia.  5. Asthma.  6. Coronary artery disease.  7. Back pain.  8. History of bone spur.  9. Benign paroxysmal positional vertigo.  10. Concern for a hip bursitis.  11. Constipation.  12. COPD. 13. Costochondritis. 14. Macular degeneration.  15. Degenerative disk disease.  16. Peripheral vascular disease.  17. Diverticulitis.  18. A history of diverticulosis with a history of diverticulitis as well.  19. GERD.  20. Hiatal hernia.  21. Hyperlipidemia.  22. Hypothyroidism.  23. Irritable bowel syndrome.  24. Mitral valve prolapse.  25. Osteoporosis.  26. Overactive bladder.  27. 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  28. Spinal stenosis.  29. Intestinal stenosis of the intestine.  30. Tremor.   PAST SURGICAL HISTORY: Hysterectomy, carotid endarterectomy, cholecystectomy, appendectomy, coronary artery bypass graft, bladder lift and rectocele repair, bilateral cataract surgery.  Hip fracture. She had the right hip fracture and surgery 02/2011. The repair was done in [place] but she follows with Dr. Desma Mcgregor.  MEDICATIONS:  1. Aspirin 81 mg 2 tablets daily.  2. Prevacid 30 mg twice daily.  3. Tylenol Arthritis 650 mg 2 tablets every 4 to 6  hours.  4. MiraLAX one capsule in 8 ounces of water.  5. Lipitor 80 mg daily at bedtime.  6. Amlodipine 10 mg daily.  7. Symbicort 160/4.5 mcg twice a day.  8. Spiriva HandiHaler 18 mcg daily.  9. Detrol LA 4 mg extended release tablet daily in the evening.  10 Sertraline 100 mg daily.  11. Levothyroxine 75 mcg daily. 12. Ventolin inhaler 2 puffs  2 times daily.  13. Ocuvite PreserVision 1 tablet twice a day.  14. Multivitamin daily.  16. DuoNebs inhaled 3 times a day as needed.  17. Nitrostat 0.4 mg sublingual as needed.  18. Tylenol 325 mg 2 tablets every 4 hours as needed for pain.  19. Cyclobenzaprine 5 mg 1 tablet orally.  20. Oxycodone/acetaminophen 10/325 mg every 4 hours as needed for pain.   ALLERGIES: SULFA DRUGS.   SOCIAL HISTORY: The patient is a former smoker. She denies alcohol or illicit drug use. Her daughter is her healthcare power of attorney. She has a grandson and granddaughter in [place]. She is widowed. She lives at [facility]. She is retired.   FAMILY HISTORY: Notable for father deceased with CVA at age 52. Mother deceased from intestinal cancer at age 26.   REVIEW OF SYSTEMS.  CONSTITUTIONAL: Denies fever or weight loss.  EYES: Continues to have history of macular degeneration. Denies eye pain.  ENT: Denies tinnitus, ear pain.  RESPIRATORY: Denies cough, wheezing, dyspnea.  CARDIOVASCULAR: Denies chest pain, palpitations, lower extremity edema or dyspnea on exertion.  GI: Has chronic constipation, denies nausea, vomiting, or diarrhea. Admits to abdominal pain as described in HPI. No rectal bleeding. No melena.  GENITOURINARY: Admits to dysuria and urgency. She also has overactive bladder.  ENDOCRINE: No increased sweating.  INTEGUMENTARY: No new skin rashes.  MUSCULOSKELETAL: Admits to right hip pain as described. Has osteoporosis. Has chronic back pain due to spinal stenosis. Denies joint swelling or muscle aches.  NEUROLOGIC: Denies dysarthria, She does have a baseline vertigo. Denies headaches.  PSYCH: Denies anxiety or depression.   PHYSICAL EXAMINATION:  VITAL SIGNS: Temperature 98.1, blood pressure 148/79, respirations 18, heart rate 75, sating 97% on room air.  GENERAL: Elderly Caucasian female in mild distress due to pain.  PSYCH: She is awake, alert, oriented x 3. Judgment and insight are  intact.  EYES: Pupils are equally round and reactive to light and accommodation. Normal eyelids. There is no conjunctivitis. Anicteric sclerae.  ENT: Normal external ears and nares. Posterior oropharynx is clear without erythema or exudate. NECK: Supple. There is no thyromegaly.  CARDIOVASCULAR: S1, S2, regular rate and rhythm. No murmurs appreciated. No pretibial edema.  PULMONARY: Clear to auscultation bilaterally. No wheezes, rales, or rhonchi. Normal effort.  ABDOMEN: Soft, nontender, nondistended. No hepatomegaly. Normal bowel sounds appreciated.  SKIN: Warm and dry. There is normal color, and no rash appreciated.  MUSCULOSKELETAL: There is limited range of motion in bilateral hips, worse on the right due to pain. There resistance to passive flexion. I was able to flex to about 45 degrees at the right hip. But there was severe pain. There was mild tenderness to palpation over the greater trochanter on the right.    LABORATORY DATA: Urinalysis shows 3+ leukocyte esterase, 4 red blood cells per high-power field, 20 white blood cells per high-power field, 3+ bacteria. White count 9.8, hemoglobin 11.3, hematocrit 34.5, platelets of 303 with an MCV of 90. Glucose is 109. BUN is 29, creatinine is 1.15, sodium is 138, potassium is 4.3, chloride is 107,  bicarbonate is 22, calcium is 8.8, bilirubin is 0.5, alkaline phosphatase is 213, ALT is 18, AST is 24, total protein is 7.2, albumin is 3.3, osmolality is 282 with a GFR of 43 and anion gap 9.   EKG shows normal sinus rhythm at 74 beats per minute.   X-ray of the pelvis shows no acute bony abnormalities of the pelvis.   Hip x-ray, right, shows no acute bony abnormality of the right hip.   CT of abdomen and pelvis with contrast shows bilateral sacral insufficiency fractures. There is severe osteopenia. There is patchy sclerosis with serpiginous areas in the right femoral head as can be seen with avascular necrosis. Incidental findings: No pneumothorax,  a large hiatal hernia, a large amount of stool throughout the colon. No pneumoperitoneum, pneumatosis or portal venous gas. No lymphadenopathy.   ASSESSMENT AND PLAN: An 82 year old female presenting with right hip pain, severe, found to have concerns for avascular necrosis of the right hip.   1. Right hip pain most likely due to avascular necrosis.  Underlying etiology for avascular necrosis is unclear. I wonder if the intra-articular steroids contributed to this although she was at high risk given the fact that she does have osteoporosis. We will control her pain. And await further recommendations from Dr. Desma Mcgregor.  2. Hypertension: Well controlled. Continue amlodipine.  3. Constipation. Given added narcotics for pain we will ensure aggressive bowel regimen.  4. Hyperlipidemia. Continue Lipitor.  5. Asthma. Continue Symbicort.  6. Overactive bladder. Continue Detrol LA.  7. Hypothyroidism. Continue Synthroid.  8. Gastroesophageal reflux disease. Continue Prevacid . This will be replaced with Protonix here.   CODE STATUS: DO NOT RESUSCITATE, DO NOT INTUBATE. This was discussed with the patient in full detail. Her surrogate decision maker is her daughter.   DISPOSITION: The patient is being admitted inpatient for evaluation and management of  avascular necrosis as well as management of intractable pain due to the right hip avascular necrosis. I anticipate that she will require at least two hospital midnight stays.   TIME SPENT COORDINATING ADMISSION: 50 minutes   ____________________________ Samson Frederic, DO aeo:jm D: 06/08/2012 01:00:13 ET T: 06/08/2012 09:57:04 ET JOB#: [id number]  cc: Samson Frederic, DO, <Dictator> Jerrell Belfast, MD Dr. Cecille Rubin E Heather Mckendree DO ELECTRONICALLY SIGNED 06/21/2012 5:33

## 2014-10-15 DIAGNOSIS — H3531 Nonexudative age-related macular degeneration: Secondary | ICD-10-CM | POA: Diagnosis not present

## 2014-10-16 DIAGNOSIS — M5136 Other intervertebral disc degeneration, lumbar region: Secondary | ICD-10-CM | POA: Diagnosis not present

## 2014-10-16 DIAGNOSIS — M4806 Spinal stenosis, lumbar region: Secondary | ICD-10-CM | POA: Diagnosis not present

## 2014-10-16 DIAGNOSIS — M5416 Radiculopathy, lumbar region: Secondary | ICD-10-CM | POA: Diagnosis not present

## 2014-10-18 NOTE — Discharge Summary (Signed)
PATIENT NAME:  PATTON, SWISHER MR#:  [id number] DATE OF BIRTH:  1923-07-13  DATE OF ADMISSION:  06/25/2014 DATE OF DISCHARGE:  07/06/2014  ADDENDUM   Please refer to the discharge summary dictated by Dr. Vianne Bulls yesterday.  FINAL DIAGNOSES: Severe constipation causing abdominal pain, left lower lobe pneumonia, hypertension, coronary artery disease, chronic obstructive pulmonary disease.   CONDITION: Stable.   CODE STATUS: Full code.   HOME MEDICATIONS: Please refer to medication reconciliation list.   DIET: Low-sodium, low-fat, low-cholesterol diet.   ACTIVITY: As tolerated.   FOLLOWUP CARE: With PCP 1-2 weeks.   HOSPITAL COURSE: The patient has no complaints today. Vital signs are stable. She is clinically stable and will be discharged to a nursing home today. I discussed the patient's discharge plan with the patient, nurse and social worker.   TIME SPENT: About 37 minutes.   ____________________________ Demetrios Loll, MD qc:TT D: 07/06/2014 14:24:20 ET T: 07/06/2014 14:41:22 ET JOB#: [id number]  cc: Demetrios Loll, MD, <Dictator> Demetrios Loll MD ELECTRONICALLY SIGNED 07/06/2014 17:18

## 2014-10-18 NOTE — Consult Note (Signed)
Pt took 2 quarts of Miralax and it worked very well with multiple bowel movements.  Recommend she take something every day to keep bowels moving.  Also recommend stool softener with laxative OTC pills and or Miralax and metamucil. GI will not see this weekend unless you call about a problem and Dr, Allen Norris is on call.  Electronic Signatures: Manya Silvas (MD)  (Signed on 15-Jan-16 16:50)  Authored  Last Updated: 15-Jan-16 16:50 by Manya Silvas (MD)

## 2014-10-18 NOTE — Consult Note (Signed)
I doubt her elevated WBC is due to her abdomen, it is minimal tender.  Has deep cough.  Rectal exam by NP showed formed stool and will get SS enema to help pass this. Will follow with you.  Electronic Signatures: Manya Silvas (MD)  (Signed on 08-Jan-16 19:07)  Authored  Last Updated: 08-Jan-16 19:07 by Manya Silvas (MD)

## 2014-10-18 NOTE — Consult Note (Signed)
Pt abd soft, minimal tender. To get Miralax now.  To have bedside commode by bed.  Walked to nurses station without oxygen at this time.  Electronic Signatures: Manya Silvas (MD)  (Signed on 14-Jan-16 17:09)  Authored  Last Updated: 14-Jan-16 17:09 by Manya Silvas (MD)

## 2014-10-18 NOTE — H&P (Signed)
PATIENT NAME:  Chelsea [name], DEDIC MR#:  [id number] DATE OF BIRTH:  January 07, 1924  DATE OF ADMISSION:  06/25/2014  PRIMARY CARE PHYSICIAN:  Jerrell Belfast, MD  REFERRING PHYSICIAN:  Brunilda Payor A. Edd Fabian, MD  CHIEF COMPLAINT:  Abdominal pain, nausea, vomiting.   HISTORY OF PRESENT ILLNESS:  Chelsea [name] is an 81 year old pleasant female with a history of multiple medical problems including hypertension, hyperlipidemia, history of constipation, diverticulosis, and previous history of diverticulitis, who has been having intermittent constipation and diarrhea. Prior to Christmas, the patient had diarrhea followed by constipation and did not have any bowel movement for the last 9 days. The patient was recently diagnosed with pneumonia and was given levofloxacin. Since the patient did not have a bowel movement, she went to her primary care physician, who is a physician assistant and gave her Metamucil without any improvement. Concerning this, the patient went back to her primary care physician, who gave her some laxative pills. First, she took 2 and the following day took 3 pills and started to have severe abdominal pain associated with nausea and chills. Concerning this, she came to the Emergency Department.   Workup in the Emergency Department:  CT of the abdomen and pelvis done showed mild prominence of the fluid and stool-filled colon with slight caliber transition over the rectosigmoid colon; otherwise, no evidence of obstruction or focal mass. Concerning this, surgery was consulted, who recommended GI consult and flexible sigmoidoscopy. The patient states she has not been passing flatus well. The patient also is having severe cough associated with mild shortness of breath. The patient had a few episodes of chills. In the Emergency Department, the patient is noted to have WBC count of 28,000. Mildly elevated BUN and creatinine and bicarbonate of. Chest x-ray does not show any infiltrates.   PAST MEDICAL  HISTORY: 1.  Hypertension.  2.  Anemia.  3.  Coronary artery disease status post coronary artery bypass grafting.  4.  Osteoarthritis.  5.  Benign paroxysmal positional vertigo.  6.  Constipation.  7.  COPD.  8.  Macular degeneration.  9.  Peripheral vascular disease.  10.  Diverticulosis with an episode of diverticulitis.  11.  Gastroesophageal reflux disease.  12.  Hiatal hernia.  13.  Hyperlipidemia.  14.  Hypothyroidism.  15.  Irritable bowel syndrome.  16.  Mitral valve prolapse.  17.  Osteoporosis.  18.  Overactive bladder.  19.   brainstem.  20.  Spinal stenosis.  21.  Intestinal stenosis of the intestine.   PAST SURGICAL HISTORY: 1.  Hysterectomy.  2.  Carotid endarterectomy.  3.  Cholecystectomy.  4.  Appendectomy.  5.  Coronary artery bypass grafting.  6.  Bladder tuck.  7.  Rectocele repair.  8.  Bilateral cataract surgery.   ALLERGIES:  SULFA.   HOME MEDICATIONS: 1.  Percocet 10/325 mg 4 times a day as needed.  2.  Aspirin 81 mg daily.  3.  Nitroglycerin 0.4 mg sublingually as needed.  4.  Diazepam 5 mg every 6 hours as needed.  5.  Lorazepam 0.5 mg 2 times a day as needed.  6.  Zoloft 100 mg daily.  7.  Loratadine 10 mg daily.  8.  Atorvastatin 80 mg daily.  9.  Alendronate once a week.  10.  Albuterol every 6 hours as needed.  11.  Spiriva 18 mcg 2 times a day.  12.  Symbicort 2 puffs 2 times a day.  13.  Amlodipine 10 mg once a day.  14.  MiraLax 17 grams once a day.  15.  Montelukast 10 mg daily.  16.  Calcium citrate daily.  17.  Metaxalone 800 mg every 6 hours as needed.  18.  Lansoprazole 30 mg daily.  19.  Levothyroxine 75 mcg daily.  20.  Detrol LA 4 mg once a day.  21.  Vitamin C 250 mg once a day.   SOCIAL HISTORY:  Former smoker, quit 60 years back. Denies drinking alcohol or using illicit drugs. Daughter has power of attorney, who lives in [place]. Widowed. Currently lives at [facility]. Retired.   FAMILY HISTORY:  Father deceased  with CVA at the age of 61. Mother deceased with intestinal cancer at the age of 79.   REVIEW OF SYSTEMS: CONSTITUTIONAL:  Experiencing generalized weakness.  EYES:  No change in vision. Has macular degeneration.  EARS, NOSE, AND THROAT:  No change in hearing.  RESPIRATORY:  Has cough and shortness of breath.  CARDIOVASCULAR:  No chest pain or palpitations.  GASTROINTESTINAL:  Has nausea, vomiting, and constipation.  GENITOURINARY:  No dysuria or hematuria.  HEMATOLOGIC:  No easy bruising or bleeding.  ENDOCRINE:  History of hypothyroidism.  SKIN:  No rash or lesions.  MUSCULOSKELETAL:  Has osteoarthritis.   PHYSICAL EXAMINATION: GENERAL:  This is a well-built, well-nourished, age-appropriate female lying down in the bed, not in distress.  VITAL SIGNS:  Temperature is 98.1, pulse 90, blood pressure 133/61, respiratory rate 24, and oxygen saturation is 92% on room air.  HEENT:  Head is normocephalic and atraumatic. There is no scleral icterus. Conjunctivae are normal. Pupils are equal and react to light. Mucous membranes are dry. No pharyngeal erythema.  NECK:  Supple. No lymphadenopathy. No JVD. No carotid bruits.  CHEST:  Has no focal tenderness.  LUNGS:  Bilaterally clear to auscultation.  HEART:  S1 and S2, regular. No murmurs are heard.  ABDOMEN:  Decreased bowel sounds in all 4 quadrants, significantly worse in the lower part of the abdomen. Significant distention of the abdomen. Severe tenderness in the lower part of the abdomen. Cannot appreciate hepatosplenomegaly.  EXTREMITIES:  No pedal edema. Pulses are 2+.  NEUROLOGIC:  The patient is alert and oriented to place, person, and time. Cranial nerves II through XII are intact. Motor is 5/5 in upper and lower extremities.   LABORATORY DATA:  BUN is 33, creatinine 1.29, bicarbonate , albumin 3.1; the rest of all the values are within normal limits. CBC:  WBC is 28,000. Lipase is 53. UA is negative for nitrites and leukocyte esterase.  Chest x-ray, one view, portable:  No acute cardiopulmonary disease. CT of the abdomen and pelvis:  As mentioned above, significant stool burden.    ASSESSMENT AND PLAN:  Chelsea [name] is an 81 year old female who comes with severe constipation and colitis.   1.  Colitis.. We will keep the patient on Zosyn. Keep the patient n.p.o. after midnight. Continue with clear liquid diet. Continue with intravenous fluids. Severe constipation is most likely from the narcotics. Also concern about the stenosis in the sigmoid area. We will give gentle enema tonight.  2.  Sepsis secondary to colitis. We will keep the patient on Zosyn.  3.  Severe bronchitis. The patient has bilateral wheezing. Keep the patient on DuoNeb and Solu-Medrol.  4.  Nausea and vomiting as mentioned above. Continue with antinausea medications. Keep the patient on a clear liquid diet.  5.  Hypertension, currently well controlled. Start back on home medications tomorrow.  6.  Hypothyroidism. Continue with  the Synthroid.  7.  Debility. We will involve physical therapy. The patient states she has been falling down frequently.  8.  History of coronary artery disease. The patient is not on any antiplatelets, only on statins.  9.  Depression. Continue with Zoloft.  10.  Keep the patient on deep vein thrombosis prophylaxis with Lovenox.   TIME SPENT:  55 minutes.   ____________________________ Monica Becton, MD pv:nb D: 06/25/2014 21:26:29 ET T: 06/26/2014 00:05:57 ET JOB#: [id number]  cc: Monica Becton, MD, <Dictator> Jerrell Belfast, MD  Monica Becton MD ELECTRONICALLY SIGNED 06/27/2014 23:50

## 2014-10-18 NOTE — Consult Note (Signed)
Brief Consult Note: Diagnosis: constipation.   Patient was seen by consultant.   Consult note dictated.   Recommend to proceed with surgery or procedure.   Discussed with Attending MD.   Comments: Seen with Dr Lunette Stands. Suspect simple constipation but no prior colonoscopy. Possible stricture? Needs GI eval.  Electronic Signatures: Florene Glen (MD)  (Signed 07-Jan-16 22:04)  Authored: Brief Consult Note   Last Updated: 07-Jan-16 22:04 by Florene Glen (MD)

## 2014-10-18 NOTE — Consult Note (Signed)
Chief Complaint:  Subjective/Chief Complaint "I'm doing about the same." She denies feeling constipated. Still has LUQ and LLQ discomfort, a little RUQ. Tolerating meals. Still coughs.   VITAL SIGNS/ANCILLARY NOTES: **Vital Signs.:   11-Jan-16 08:05  Vital Signs Type Q 8hr  Temperature Temperature (F) 97.9  Celsius 36.6  Temperature Source oral  Pulse Pulse 73  Respirations Respirations 18  Systolic BP Systolic BP 151  Diastolic BP (mmHg) Diastolic BP (mmHg) 67  Mean BP 99  Pulse Ox % Pulse Ox % 98  Pulse Ox Activity Level  At rest  Oxygen Delivery 2L   Brief Assessment:  GEN well developed   Cardiac Regular  no murmur   Respiratory normal resp effort   Gastrointestinal details normal Soft   Gastrointestinal details abnormal Minimum Tenderness   Tenderness LUQ  LLQ  RUQ   Radiology Results:  XRay:    10-Jan-16 12:00, Abdomen Flat and Erect  Abdomen Flat and Erect   REASON FOR EXAM:    Acute Colitis  COMMENTS:   Bedside (portable):Y    PROCEDURE: DXR - DXR ABDOMEN 2 V FLAT AND ERECT  - Jun 28 2014 12:00PM     CLINICAL DATA:  Acute colitis.    EXAM:  ABDOMEN - 2 VIEW    COMPARISON:  06/25/2014    FINDINGS:  Visualization of the lower thorax demonstrates heterogeneous  opacities within the left lung base. Status post median sternotomy.  Oral contrast material is demonstrated throughout the colon. There a  few nonspecific gaseous distended loops of bowel withinthe left  lower quadrant. Supine evaluation limited for the detection of free  intraperitoneal air.     IMPRESSION:  Oral contrast material demonstrated throughout the colon.    Nonspecific focally dilated loop of bowel within the left lower  quadrant, potentially secondary to ileus.      Electronically Signed    By: Lovey Newcomer M.D.    On: 06/28/2014 14:25     Verified By: Ilsa Iha, M.D.,  CT:    07-Jan-16 19:10, CT Abdomen and Pelvis With Contrast  CT Abdomen and Pelvis With Contrast    REASON FOR EXAM:    (1) pain; (2) pain  COMMENTS:       PROCEDURE: CT  - CT ABDOMEN / PELVIS  W  - Jun 25 2014  7:10PM     CLINICAL DATA:  Patient reports no bowel movement 10 days with  vomiting last night and chronic short of breath with recent  worsening.    EXAM:  CT ABDOMEN AND PELVIS WITH CONTRAST    TECHNIQUE:  Multidetector CT imaging of the abdomen and pelvis was performed  using the standard protocol following bolus administration of  intravenous contrast.    CONTRAST:  80 mL Omnipaque 300 IV.    COMPARISON:  10/23/2013 and 06/07/2012    FINDINGS:  Lung bases demonstrate minimal bibasilar scarring/bronchiectatic  change. There is a large hiatal hernia. There is calcified plaque  over the coronary arteries. Sternotomy wires are present.    Abdominal images demonstrate elevation of the right hemidiaphragm  unchanged. There has been a prior cholecystectomy with minimal  stable prominence of the common bile duct and central intrahepatic  ducts. There is mild fatty atrophy of the pancreas. The spleen and  adrenal glands are within normal. Kidneys are is the lower limits of  normal its are as and otherwise unremarkable without hydronephrosis  or nephrolithiasis. The ureters are unremarkable. There is calcified  plaque over  the abdominal aorta and iliac arteries. Appendix is not  seen. Mesentery is within normal.    Small bowel is within normal. There is no free fluid or focal  inflammatory change. There is mild prominence of the colon which is  fluid and stool filled. There isa focal caliber change at over the  rectosigmoid colon, although no evidence of obstruction or focal  mass.    Pelvic images demonstrate streak artifact from a right hip  prosthesis. There is a suggestion mild pelvic floor laxity bladder  and rectum are otherwise unremarkable. Evidence of previous  hysterectomy.    There moderate degenerative changes of the spine with curvature of  the  lumbar spine convex to the left. There are stable compression  fractures of T11, L1 and L5.     IMPRESSION:  Mild prominence of a fluid and stool-filled colon with slight  caliber transition over the rectosigmoid colon, although no evidence  of obstruction or focal mass. Otherwise, no acute findings in the  abdomen/pelvis.    Stable large hiatal hernia.    Minimal and bibasilar scarring and bronchiectatic change.  Stable compression fractures of the thoracic and lumbar spine.      Electronically Signed    By: Marin Olp M.D.    On: 06/25/2014 19:43         Verified By: Pearletha Alfred, M.D.,   Assessment/Plan:  Assessment/Plan:  Assessment Constipation Bronchitits, Left pneumonia DRE negative with slight stool smear heme neg today Generalized abd pain-today LUQ, LLQ, some RUQ   Plan OK to start daily Miralax Observation No further recommendations per Dr. Vira Agar at this time due to pulmonary status-not a candidate for luminal evluation.   Electronic Signatures: Gershon Mussel (NP)  (Signed 11-Jan-16 13:38)  Authored: Chief Complaint, VITAL SIGNS/ANCILLARY NOTES, Brief Assessment, Radiology Results, Assessment/Plan   Last Updated: 11-Jan-16 13:38 by Gershon Mussel (NP)

## 2014-10-18 NOTE — Consult Note (Signed)
Pt has epigastric pain which is worse with abd tightening likely indicating muscle wall discomfort from coughing.  I did a rectal exam and formed slightly soft stool encountered and I tried to mush this up to make passage easier and then asked nurse assisting me to give Fleets enema x2 to the patient.  Electronic Signatures: Manya Silvas (MD)  (Signed on 09-Jan-16 16:27)  Authored  Last Updated: 09-Jan-16 16:27 by Manya Silvas (MD)

## 2014-10-18 NOTE — Consult Note (Signed)
Pt complains of constipation but pain in R upper abd.  no tenderness in lower left, no palpable fullness.  She is for CT today but did not get oral contrast so not sure what test she is having. Should get 1 liter up to 2 liters of Miralax a day til bowel movement unless contraindication on CT.  Electronic Signatures: Manya Silvas (MD)  (Signed on 13-Jan-16 16:59)  Authored  Last Updated: 13-Jan-16 16:59 by Manya Silvas (MD)

## 2014-10-18 NOTE — Consult Note (Signed)
PATIENT NAME:  Chelsea [name], Chelsea [name] MR#:  [id number] DATE OF BIRTH:  Mar 23, 1924  DATE OF CONSULTATION:  06/26/2014  REFERRING PHYSICIAN:  Monica Becton, MD CONSULTING PHYSICIAN:  Manya Silvas, MD/Jessicamarie Amiri A. Jerelene Redden, ANP (Adult Nurse Practitioner)  PRIMARY CARE PHYSICIAN: Jerrell Belfast, MD   REASON FOR CONSULTATION: Colonic stenosis with severe constipation.   HISTORY OF PRESENT ILLNESS: This 81 year old patient with history of chronic mild constipation, diverticulosis, and previous history of diverticulitis with last colonoscopy in 2005 was admitted to the hospital for abdominal pain, nausea, vomiting, and recent pneumonia. The patient states that she was diagnosed with pneumonia on 06/12/2014 and was given a 10-day course of Levaquin. She did not have a bowel movement for about 10 to 11 days in December. She used MiraLax, a couple of laxatives without good response.   The patient came to the Emergency Room because of her abdominal distress and the CT of the abdomen and pelvis with contrast performed yesterday showed mild prominence of a fluid- and stool-filled colon with slight caliber transition over the rectosigmoid colon, although no evidence of obstruction or focal mass was seen. The patient was seen by a surgeon, who deemed her to have a nonsurgical abdomen. She did pass a few small balls of stool this morning.   The patient says she is short of breath and she did desaturate today with room air oxygen with exertion. On 2 liters, she is saturating at 93%. She is receiving antibiotic therapy. The patient says she feels weak overall, and her stomach does still feel distended and painful to her. She had 1 episode of vomiting last Wednesday. She does feel slightly nauseated, and she was having fever and chills. The patient was admitted with diagnosis of sepsis secondary to bronchitis and she is receiving IV Levaquin, DuoNebs and steroids. GI has been asked to see her regarding further  evaluation and management.   PAST MEDICAL HISTORY:  1.  Hypertension.  2.  Anemia.  3.  CAD status post coronary artery bypass grafting.  4.  Osteoarthritis.  5.  Benign positional vertigo.  6.  Chronic constipation.  7.  COPD.  8.  Macular degeneration.  9.  Peripheral vascular disease.  10.  Diverticulosis with episode of diverticulitis.  11.  GERD.  12.  Large hiatal hernia.  13.  Hyperlipidemia.  14.  Hypothyroidism.  15.  Mitral valve prolapse.  16.  Osteoporosis.  17.  Overactive bladder.  18.  Spinal stenosis.   PAST SURGICAL HISTORY:  1.  Hysterectomy.  2.  Carotid endarterectomy.  3.  Cholecystectomy.  4.  Appendectomy.  5.  Coronary artery bypass grafting.  6.  Bladder tuck.  7.  Rectocele repair.  8.  Bilateral cataract surgery.   ALLERGIES: SULFA.   REVIEW OF SYSTEMS: As noted in the history of present illness. The patient denies chest pain, pressure, heaviness, tightness. She denies pain with inspiration. She does have cough and feels short of breath, even just with rest. The patient is up to the bedside commode and does feel weak. Constipation is noted. She reports up to this event in December, she was doing well from a GI standpoint. Remaining review of systems otherwise negative.   PHYSICAL EXAMINATION:  VITAL SIGNS:  Temperature 97.5, pulse rate 91, respiratory rate 18, blood pressure 146/72, pulse oximetry on 2 liters is 93%. She desaturated to 86% on room air with exertion.  HEENT: Head is normocephalic. Conjunctivae pink. Sclerae anicteric. Oral mucosa is moist, intact.  NECK:  Supple. Trachea midline.  CARDIAC: S1, S2 without murmur or gallop.  PULMONARY: Lungs have a few scattered crackles. She does have breathiness with speaking, using oxygen therapy as noted. Respirations are nonlabored.  ABDOMEN: Soft, positive mild distention, mild tenderness, generalized.  RECTAL: Exam shows no stool immediately palpable, but as the finger inserted at the very top,  there is soft, formed, heavy, dense stool present, light brown. Rectal walls are smooth to that point. Sphincter tone is normal.  EXTREMITIES: Without edema, cyanosis, or clubbing.  SKIN: Warm and dry without rash.  PSYCHIATRIC: Affect and mood is a little irritable, but pleasant. She says she does not feel well.  NEUROLOGIC: Cranial nerves grossly intact. Speech is clear. The patient is able to reposition herself in bed.   LABORATORY DATA: Admission labs with BUN 33, creatinine 1.129, glucose 173, serum albumin 3.1, alkaline phosphatase 181, AST 49, ALT 26. Troponin less than 0.02. WBC 28.3, hemoglobin 13.2, blood culture negative x2. Urine is clear amber. Lactic acid 1.9.   Repeat laboratory study: BUN 36, creatinine 1.33. WBC 18.8, hemoglobin 10.8.   RADIOLOGY: CT of the abdomen and pelvis with contrast, 06/25/2014, shows lung bases with minimal bibasilar scarring/bronchiectatic change. There is a large hiatal hernia. There is a calcified plaque over the coronary arteries. Sternotomy wires are noted. Abdomen shows a prior  cholecystectomy with minimal stable prominence of the common bile duct and central intrahepatic ducts. There is mild fatty atrophy of the pancreas. Mesentery is within normal. Small bowel within normal. There is mild prominence of the colon, which is fluid and stool filled. There is a focal caliber change over the rectosigmoid colon, although no evidence of obstruction or focal mass. There are degenerative changes of the spine.   Abdomen, 2 views shows no bowel obstruction. There was no mention of the stool.   Chest x-ray, PA and lateral, showed hypoinflation without acute cardiopulmonary disease, moderate-sized hiatal hernia.   IMPRESSION: The patient comes in with abdominal pain, nausea, vomiting, coughing, leukocytosis with sepsis thought secondary to bronchitis. The CT of the abdomen shows narrowing in the rectosigmoid area with dilatation proximal. She reports lack of bowel  movements for over 10 days. Rectal exam shows soft, packed, light brown stool about 2 inches into the rectum. She has constipation.   PLAN:  1.  Begin soapsuds enemas this evening x1. Monitor response.  2.  The patient is on antibiotic therapy for her pulmonary disease.  3.  Close monitoring regarding her hemoglobin which has drifted, as well as WBC.  4.  Her most recent colonoscopy was 2005 that showed normal colon. The patient denies GI problems or concerns until her acute event of pneumonia symptoms, December 25. She reports following the antibiotic therapy she became constipated. Further GI recommendations pending the patient's clinical course after the enemas provided.   This case was discussed with Dr. Vira Agar in collaboration of care.    ____________________________ Janalyn Harder. Jerelene Redden, ANP (Adult Nurse Practitioner) kam:ah D: 06/26/2014 16:46:41 ET T: 06/26/2014 17:11:39 ET JOB#: [id number]  cc: Joelene Millin A. Jerelene Redden, ANP (Adult Nurse Practitioner), <Dictator> Janalyn Harder Sherlyn Hay, MSN, ANP-BC Adult Nurse Practitioner ELECTRONICALLY SIGNED 06/26/2014 18:41

## 2014-10-18 NOTE — Consult Note (Signed)
PATIENT NAME:  Chelsea [name], Chelsea [name] MR#:  [id number] DATE OF BIRTH:  23-Dec-1923  DATE OF CONSULTATION:  06/25/2014  REFERRING PHYSICIAN:   CONSULTING PHYSICIAN:  Jerrol Banana. Burt Knack, MD  CHIEF COMPLAINT: Abdominal pain and constipation.   HISTORY OF PRESENT ILLNESS: This is an 81 year old Caucasian female patient with a history of constipation who presents with poor bowel movements over the last few days. She did have diarrhea last week. She has had a history of diverticulitis in the past and complains of abdominal pain. I was asked to see the patient for possible colonic stricture. She has had a recent diagnosis of pneumonia and lives in a care center. She has not had a bowel movement recently, has not vomited recently.   PAST MEDICAL HISTORY: Hypertension, coronary artery disease, peripheral vascular disease, anemia, COPD, diverticulitis, reflux disease.   PAST SURGICAL HISTORY: Coronary artery bypass graft, left carotid endarterectomy, cholecystectomy, appendectomy, bladder suspension and cataracts.   ALLERGIES: SULFA.   MEDICATIONS: Multiple, see chart.   FAMILY HISTORY: Noncontributory. Her mother did have intestinal cancer of some sort,  which caused her demise.   SOCIAL HISTORY: The patient stopped smoking several years ago. She lives at an extended care facility.  REVIEW OF SYSTEMS: A 10-system review is performed and negative with the exception of that mentioned in the HPI.   PHYSICAL EXAMINATION:  GENERAL: Uncomfortable-appearing female patient.  VITAL SIGNS: She is afebrile. Vital signs are stable.  HEENT: Shows no scleral icterus.  NECK: No palpable neck nodes.  CHEST: Shows bilateral rhonchi.  CARDIAC: Regular rate and rhythm.  ABDOMEN: Distended, slightly tympanitic, minimally tender. No peritoneal signs. Scars are noted.  EXTREMITIES: Without edema.  NEUROLOGIC: Grossly intact.  INTEGUMENT: Shows no jaundice.   DIAGNOSTIC DATA: Laboratory values are personally reviewed  demonstrating a white blood cell count of 28,000, H and H of 13 and 41, creatinine of 1.3. Serum albumin of 3.1.  CT scan is personally reviewed, as well, and there appears to be narrowing in the rectosigmoid area with dilatation proximal, but there is also gas and stool distal.   ASSESSMENT AND PLAN: This is a patient with chronic constipation who presents with abdominal pain and constipation. While she may just be experiencing constipation, this may also be a diverticular stricture or a malignancy. She has never had a colonoscopy before. I would recommend gastroenterology consultation for possible sigmoidoscopy, and if there is no fixed stricture then treat this as typical constipation in the elderly. I will be happy to follow the patient while in the hospital. The patient was seen and discussed with Dr. Lunette Stands.     ____________________________ Jerrol Banana Burt Knack, MD rec:ST D: 06/26/2014 01:11:58 ET T: 06/26/2014 01:56:10 ET JOB#: [id number]  cc: Jerrol Banana. Burt Knack, MD, <Dictator> Florene Glen MD ELECTRONICALLY SIGNED 06/26/2014 7:01

## 2014-10-18 NOTE — Discharge Summary (Signed)
PATIENT NAME:  Chelsea [name], Chelsea [name] MR#:  [id number] DATE OF BIRTH:  1924-02-27  DATE OF ADMISSION:  06/25/2014 DATE OF DISCHARGE: 07/06/2014  DISPOSITION:  Possible discharge to [facility] on 01/18.  DISCHARGE DIAGNOSES:  1. Severe constipation causing abdominal pain. The patient required multiple stool softeners, now her constipation is resolved.  2. Left lower lobe pneumonia.  3. Anxiety.  4. Nausea and vomiting due to constipation, resolved.  5. Hypothyroidism.  6. Hypertension.  7. Debilitating depression.   PRESENT MEDICATIONS:  1. Tylenol 325 mg. The patient needs to take 650 mg every 4 hours as needed for pain and temperature.  2. Amlodipine 10 mg p.o. daily.  3. Zofran 4 mg IV every 4 hours p.r.n. for nausea.  4. Mucinex 600 mg p.o. b.i.d. 5. Levothyroxine 0.075 mg in the morning.  6. Multivitamin 1 tablet daily. 7. (Dictation Anomaly) <<MISSING TEXT>> 1 mg p.o. daily.  8. Hydralazine 25 mg 4 times daily. 9. Spiriva 1 capsule inhalation daily.  10. Atorvastatin 80 mg at bedtime. 11. MiraLax 75 mg p.o. daily for constipation. Please do not change MiraLax because she needs to be on regular dose of laxatives for her constipation.  12. Colace 100 mg p.o. b.i.d.  13. Zoloft 50 mg p.o. at bedtime.  14. Zosyn 3.375 IV every 8 hours. This was started on 01/13. The patient can be discharged tomorrow with Levaquin for pneumonia.  15. She is on nystatin 5 mL every 6 hours for thrush as needed. Her thrush is better so she will not need that at discharge.  16. Vancomycin 750 mg IV daily. The dose was adjusted today.  17. The patient also is on nebulizers with albuterol every 6 hours.  CONSULTATIONS: Physical therapy consult, pulmonology consult with Dr. Raul Del, a GI consult with Dr. Vira Agar.   PROCEDURES: CT chest, echocardiogram.  DIET: Low-sodium, low-fat diet with Ensure Clear 3 times a day with meals.   HOSPITAL COURSE: The patient is an 81 year old female who  lives at [facility] comes in because of severe abdominal pain. The patient also had some nausea and vomiting. She has a history of high blood pressure, hyperlipidemia, constipation which is chronic.  1. The patient had no BM for 9 days. She was tried on Metamucil by her primary doctor without improvement. Because of that, she came in. CT of the abdomen on admission showed stools in the colon without any small bowel obstruction. No evidence of focal mass. The patients white count was 28,000 on admission. The patient was admitted for colitis. She was given IV Zosyn and she was kept n.p.o. after midnight and IV fluids were given. The patient was thought to have constipation secondary to narcotics. The patient also had sepsis on admission due to colitis. The patient's CT abdomen with the result as mentioned. She was seen by GI and surgery as well. The patient started on stool softeners, but she continued to have no BM for 4 to 5 days even after admission. She received Dulcolax suppositories and also enemas. The patient was given 2 quarts of MiraLax on 01/14, which helped her with the constipation. She did have multiple BMs after that and right now she is on a regimen of MiraLax and Colace. She has had multiple bowel movements since 2 days. She wanted to discontinue one stools soft with possible. We stopped Senna and we continued MiraLax and Colace. Dr. Tiffany Kocher saw the patient. He recommended continuing stool softeners because she has severe constipation. After her  bowel movements started, the patient felt better and abdominal pain improved. The patient's white count also improved. She did receive Zosyn and she is on vancomycin for pneumonia. She can be on Levaquin when she goes. She did have follow up x-rays of the abdomen, which did not show any small bowel obstruction.  2. Pneumonia. The patient continued to have cough, shortness of breath and required oxygen. The patient's chest x-ray was showing  no pneumonia but persistent symptoms made me think she might have pneumonia. We did a CT of the chest with contrast and CT of the chest was done on 01/13. The patient's CT of the chest showed bibasilar atelectasis, worse on the left side. The patient was seen by Dr. Raul Del as well. he agreed with the treatment that we are doing in terms of steroids, nebulizers and antibiotics. The patient was started on vancomycin and Zosyn on the 13th. Because of her persistent symptoms, we stopped the Levaquin. The patient's symptoms improved nicely with change in antibiotics and also her white count improved. Her white count was 13.3 on 01/14. She also had an echocardiogram done, which showed an EF of 60% to 65% with normal systolic function. The patient right now is not hypoxic and is hemodynamically stable. Her pneumonia is improved. Her constipation is improved. She did have elevated blood pressures during the hospital stay and we increased her Norvasc and adjusted the hydralazine dose. So far, it is very stable with this regimen with a blood pressure stable at 122/60, heart rate 69 and she is afebrile.  3. History of anxiety and depression. She is on Zoloft and will be continued on Ativan. The patient right now is participating in physical therapy. Physical therapy recommended rehab. She will be going to [facility].  4. The patient has a history of chronic obstructive pulmonary disease. She is on Spiriva, Symbicort at baseline so we will continue with that.   Time Spent on interim discharge paperwork was more than 30 minutes.     ____________________________ Epifanio Lesches, MD sk:TT D: 07/05/2014 15:09:31 ET T: 07/05/2014 15:53:21 ET JOB#: [id number]  cc: Epifanio Lesches, MD, <Dictator>

## 2014-10-18 NOTE — Consult Note (Signed)
Pt with more pain in RUQ today, somewhat tender.  Will get flat plate of abd when she gets a CXR today.  Tender in upper abd but has been coughing a lot.  Some results with fleets yesterday, will repeat.  BP 177/67, P75, T 97.5, Sat 98% on 2L.  If no signs of possible blockage will give Miralax today.  Electronic Signatures: Manya Silvas (MD)  (Signed on 10-Jan-16 11:30)  Authored  Last Updated: 10-Jan-16 11:30 by Manya Silvas (MD)

## 2014-10-18 NOTE — Consult Note (Signed)
Pt complaining of bowels not moving.  Will give Miralax and dulcolax suppository to try to get things moving.  Abd did not feel signif bloated yesterday.  Some tenderness in RUQ/epigastric area.  Electronic Signatures: Manya Silvas (MD)  (Signed on 14-Jan-16 14:47)  Authored  Last Updated: 14-Jan-16 14:47 by Manya Silvas (MD)

## 2014-11-02 ENCOUNTER — Ambulatory Visit (INDEPENDENT_AMBULATORY_CARE_PROVIDER_SITE_OTHER): Payer: Federal, State, Local not specified - PPO | Admitting: Podiatry

## 2014-11-02 DIAGNOSIS — M79676 Pain in unspecified toe(s): Secondary | ICD-10-CM | POA: Diagnosis not present

## 2014-11-02 DIAGNOSIS — B351 Tinea unguium: Secondary | ICD-10-CM

## 2014-11-02 NOTE — Progress Notes (Signed)
She presents today with a chief complaint of painful elongated toenails.  Objective: Pulses are palpable bilateral. Nails are thick yellow dystrophic onychomycotic and painful palpation.  Assessment: Pain in limb secondary to onychomycosis 1 through 5 bilateral.  Plan: Debridement of nails 1 through 5 bilateral is cover service secondary to pain followup with her in 3 months.            Mild HM med. 2nd toe R.

## 2014-11-03 ENCOUNTER — Ambulatory Visit (INDEPENDENT_AMBULATORY_CARE_PROVIDER_SITE_OTHER): Payer: Medicare Other | Admitting: Cardiovascular Disease

## 2014-11-03 ENCOUNTER — Encounter: Payer: Self-pay | Admitting: Cardiovascular Disease

## 2014-11-03 VITALS — BP 128/56 | HR 61 | Ht 65.0 in | Wt 147.8 lb

## 2014-11-03 DIAGNOSIS — I6523 Occlusion and stenosis of bilateral carotid arteries: Secondary | ICD-10-CM | POA: Diagnosis not present

## 2014-11-03 DIAGNOSIS — R0609 Other forms of dyspnea: Secondary | ICD-10-CM

## 2014-11-03 DIAGNOSIS — I25719 Atherosclerosis of autologous vein coronary artery bypass graft(s) with unspecified angina pectoris: Secondary | ICD-10-CM | POA: Diagnosis not present

## 2014-11-03 DIAGNOSIS — E785 Hyperlipidemia, unspecified: Secondary | ICD-10-CM

## 2014-11-03 DIAGNOSIS — R1084 Generalized abdominal pain: Secondary | ICD-10-CM

## 2014-11-03 DIAGNOSIS — I10 Essential (primary) hypertension: Secondary | ICD-10-CM | POA: Diagnosis not present

## 2014-11-03 MED ORDER — ATORVASTATIN CALCIUM 80 MG PO TABS: 80.0000 mg | ORAL_TABLET | Freq: Every day | ORAL | Status: DC

## 2014-11-03 NOTE — Assessment & Plan Note (Signed)
Blood pressure is well controlled on today's visit. No changes made to the medications. 

## 2014-11-03 NOTE — Assessment & Plan Note (Signed)
Cholesterol is at goal on the current lipid regimen. No changes to the medications were made.  

## 2014-11-03 NOTE — Assessment & Plan Note (Signed)
Stable mild to moderate carotid arterial disease.

## 2014-11-03 NOTE — Patient Instructions (Signed)
You are doing well. No medication changes were made.  Please call us if you have new issues that need to be addressed before your next appt.  Your physician wants you to follow-up in: 6 months.  You will receive a reminder letter in the mail two months in advance. If you don't receive a letter, please call our office to schedule the follow-up appointment.   

## 2014-11-03 NOTE — Assessment & Plan Note (Signed)
Prior history of constipation, in the hospital January 2016, on a bowel regimen

## 2014-11-03 NOTE — Progress Notes (Signed)
Patient ID: Chelsea [name], female    DOB: 08-Aug-1923, 81 y.o.   MRN: [id number]  HPI Comments: 81 year-old woman with CAD s/p CABG, diastolic dysfunction, carotid endarterectomy on the left 10 years ago, now with 40-59% carotid disease , mildly dilated left atrium, mild MR, presenting today for follow-up evaluation of her carotid arterial disease, carotid disease  Chronic severe back pain from spinal stenosis. Continues to have problems with constipation, in the hospital January 2016, CT scan confirming constipation Miralax causes leakage, changed back to senna  No significant shortness of breath with exertion Denies any chest pain concerning for angina Overall feels well, walks with a walker secondary to her back pain  EKG on today's visit shows normal sinus rhythm with rate 61 bpm, no significant ST or T-wave changes   Other past medical history dizziness in the past, h/o fall with right hip fracture, right arm fracture, 3 ribs injured. She had a long recovery in [place] where the injury occurred. She is now back at [facility]. Previous upper respiratory infection. 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 She has had significant pain, cortisone shots x3 to her hip now wearing a TENS unit for pain. She reports having moderate spinal stenosis.  several admissions to the hospital for abdominal pain. One was in late December 2013, any in February 2014 .  Weight was 147 pounds September 2013, down to 138 pounds in February 2014, down to 131 pounds. Weight has improved on today's visit now up to 143 pounds   hip surgery in March 2014. She had a complicated recovery with bronchitis requiring long period of rehabilitation.  She walks with a cane. Continues to have rare abdominal pain. She did take laxatives for a short period of time, now has frequent bowel movements without laxatives.  She reports having hypoxia that her visit with [facility]. Ambulation in the office  today showed saturations maintained in the mid 90s. Minimal prior smoking history She does have chronic mild shortness of breath. Continues to have significant back pain, leg pain She would like to start an exercise program at [facility]  Previous echo in 05/2009 showed normal LV function without valvular abnormalities   stress test was negative for ischemia.   carotid ultrasound last year showed mild bilateral disease  Allergies  Allergen Reactions  . Sulfonamide Derivatives     Current Outpatient Prescriptions on File Prior to Visit  Medication Sig Dispense Refill  . albuterol (PROVENTIL HFA;VENTOLIN HFA) 108 (90 BASE) MCG/ACT inhaler Inhale 2 puffs into the lungs every 6 (six) hours as needed.    Marland Kitchen alendronate (FOSAMAX) 70 MG tablet Take 70 mg by mouth once a week. Take with a full glass of water on an empty stomach.    Marland Kitchen amLODipine (NORVASC) 10 MG tablet Take 10 mg by mouth daily.    Marland Kitchen aspirin 81 MG tablet Take 81 mg by mouth daily.    . diazepam (VALIUM) 5 MG tablet Take 5 mg by mouth every 6 (six) hours as needed.      . fluticasone (FLONASE) 50 MCG/ACT nasal spray Place 2 sprays into both nostrils daily.    . lansoprazole (PREVACID) 30 MG capsule Take 30 mg by mouth 2 (two) times daily.     Marland Kitchen levothyroxine (SYNTHROID, LEVOTHROID) 75 MCG tablet Take 75 mcg by mouth daily.      Marland Kitchen loratadine (ALLERGY RELIEF) 10 MG tablet Take 10 mg by mouth daily.    Marland Kitchen  LORazepam (ATIVAN) 0.5 MG tablet Take 0.5 mg by mouth as needed.     . metaxalone (SKELAXIN) 800 MG tablet Take 800 mg by mouth as needed for muscle spasms.    . Multiple Minerals-Vitamins (CALCIUM CITRATE +) TABS Take 1 tablet by mouth daily.      . Multiple Vitamins-Minerals (OCUVITE PRESERVISION PO) Take 1 capsule by mouth 2 (two) times daily.     . nitroGLYCERIN (NITROSTAT) 0.4 MG SL tablet Place 1 tablet (0.4 mg total) under the tongue every 5 (five) minutes as needed. 25 tablet 6  . oxyCODONE-acetaminophen (PERCOCET) 10-325 MG  per tablet Take 1 tablet by mouth every 4 (four) hours as needed.    . sertraline (ZOLOFT) 100 MG tablet Take 150 mg by mouth daily.     Marland Kitchen tolterodine (DETROL LA) 4 MG 24 hr capsule Take 4 mg by mouth daily.      . vitamin C (ASCORBIC ACID) 250 MG tablet Take 250 mg by mouth daily.     No current facility-administered medications on file prior to visit.    Past Medical History  Diagnosis Date  . Coronary artery disease     s/p CABG  . Hyperlipidemia   . Hypertension   . Bradycardia     hx of it and fatigue with beta blockade  . History of hyperkalemia   . CVD (cerebrovascular disease)     s/p carotid endarterectomy  . Stroke 1980s    left brain  . Mitral valve prolapse   . Osteoporosis   . Asthma   . Degenerative disc disease   . Spinal stenosis in cervical region   . Norovirus 2014    Past Surgical History  Procedure Laterality Date  . Coronary artery bypass graft    . Carotid endarterectomy      left  . Cholecystectomy    . Abdominal hysterectomy    . Bladder suspension    . Hip surgery  02/2011    right    Social History  reports that she has quit smoking. She has never used smokeless tobacco. She reports that she does not drink alcohol or use illicit drugs.  Family History family history includes Stroke in her father; Throat cancer in her mother.  Review of Systems  Respiratory: Negative.   Cardiovascular: Negative.   Gastrointestinal: Negative.   Musculoskeletal: Positive for back pain and gait problem.  Skin: Negative.   Neurological: Positive for weakness.  Hematological: Negative.   Psychiatric/Behavioral: Negative.   All other systems reviewed and are negative.   BP 128/56 mmHg  Pulse 61  Ht 5\' 5"  (1.651 m)  Wt 147 lb 12 oz (67.019 kg)  BMI 24.59 kg/m2  Physical Exam  Constitutional: She is oriented to person, place, and time. She appears well-developed and well-nourished.  HENT:  Head: Normocephalic.  Nose: Nose normal.  Mouth/Throat:  Oropharynx is clear and moist.  Eyes: Conjunctivae are normal. Pupils are equal, round, and reactive to light.  Neck: Normal range of motion. Neck supple. No JVD present.  Cardiovascular: Normal rate, regular rhythm, S1 normal, S2 normal, normal heart sounds and intact distal pulses.  Exam reveals no gallop and no friction rub.   No murmur heard. Trace lower extremity edema  Pulmonary/Chest: Effort normal. No respiratory distress. She has decreased breath sounds in the left lower field. She has no wheezes. She has rales. She exhibits no tenderness.  Abdominal: Soft. Bowel sounds are normal. She exhibits no distension. There is no tenderness.  Musculoskeletal:  Normal range of motion. She exhibits no edema or tenderness.  Lymphadenopathy:    She has no cervical adenopathy.  Neurological: She is alert and oriented to person, place, and time. Coordination normal.  Skin: Skin is warm and dry. No rash noted. No erythema.  Psychiatric: She has a normal mood and affect. Her behavior is normal. Judgment and thought content normal.    Assessment and Plan  Nursing note and vitals reviewed.

## 2014-11-03 NOTE — Assessment & Plan Note (Signed)
Currently with no symptoms of angina. No further workup at this time. Continue current medication regimen. 

## 2014-11-03 NOTE — Assessment & Plan Note (Signed)
pulmonary fibrosis in the left lower base. Scant in the right lower base. Seems to be stable from prior exam with minimal symptoms of shortness of breath

## 2014-11-18 ENCOUNTER — Encounter: Payer: Self-pay | Admitting: Physician Assistant

## 2014-11-18 ENCOUNTER — Ambulatory Visit (INDEPENDENT_AMBULATORY_CARE_PROVIDER_SITE_OTHER): Payer: Medicare Other | Admitting: Physician Assistant

## 2014-11-18 ENCOUNTER — Other Ambulatory Visit: Payer: Self-pay

## 2014-11-18 VITALS — BP 104/52 | HR 59 | Temp 97.6°F | Resp 16 | Wt 139.8 lb

## 2014-11-18 DIAGNOSIS — I6523 Occlusion and stenosis of bilateral carotid arteries: Secondary | ICD-10-CM

## 2014-11-18 DIAGNOSIS — M5136 Other intervertebral disc degeneration, lumbar region: Secondary | ICD-10-CM | POA: Diagnosis not present

## 2014-11-18 DIAGNOSIS — M51369 Other intervertebral disc degeneration, lumbar region without mention of lumbar back pain or lower extremity pain: Secondary | ICD-10-CM

## 2014-11-18 DIAGNOSIS — M5116 Intervertebral disc disorders with radiculopathy, lumbar region: Secondary | ICD-10-CM | POA: Diagnosis not present

## 2014-11-18 MED ORDER — MELOXICAM 7.5 MG PO TABS: 7.5000 mg | ORAL_TABLET | Freq: Every day | ORAL | Status: DC

## 2014-11-18 NOTE — Progress Notes (Signed)
Subjective:    Patient ID: Chelsea [name], female    DOB: 06-Apr-1924, 81 y.o.   MRN: [id number]  Back Pain This is a chronic problem. The current episode started in the past 7 days (5 days ago). The problem occurs constantly. The problem is unchanged. The pain is present in the lumbar spine and gluteal. The quality of the pain is described as aching and stabbing. The pain radiates to the right thigh and right knee. The pain is at a severity of 7/10. The pain is severe. The pain is worse during the day. The symptoms are aggravated by standing and twisting. Stiffness is present all day. Associated symptoms include leg pain, numbness (down right leg), tingling (down right leg to knee) and weakness. Pertinent negatives include no abdominal pain, bladder incontinence, bowel incontinence, chest pain, dysuria, fever, headaches, pelvic pain, perianal numbness or weight loss. Risk factors include history of osteoporosis and menopause. She has tried bed rest, heat and muscle relaxant for the symptoms. The treatment provided mild relief.      Review of Systems  Constitutional: Positive for activity change. Negative for fever, chills, weight loss, appetite change and unexpected weight change.  Cardiovascular: Negative for chest pain and leg swelling.  Gastrointestinal: Negative for nausea, vomiting, abdominal pain, diarrhea, constipation and bowel incontinence.  Genitourinary: Negative for bladder incontinence, dysuria, hematuria, difficulty urinating and pelvic pain.  Musculoskeletal: Positive for back pain.  Neurological: Positive for tingling (down right leg to knee), weakness and numbness (down right leg). Negative for dizziness and headaches.       Objective:   Physical Exam  Constitutional: She is oriented to person, place, and time. She appears well-developed and well-nourished. No distress.  HENT:  Head: Normocephalic and atraumatic.  Neck: Normal range of motion. Neck supple.   Cardiovascular: Normal rate, regular rhythm and normal heart sounds.  Exam reveals no gallop and no friction rub.   No murmur heard. Pulmonary/Chest: Effort normal and breath sounds normal. No respiratory distress. She has no wheezes. She has no rales.  Musculoskeletal:       Right hip: She exhibits decreased range of motion, decreased strength and tenderness. She exhibits no swelling, no crepitus, no deformity and no laceration.       Left hip: She exhibits decreased strength. She exhibits normal range of motion, no tenderness, no swelling, no crepitus, no deformity and no laceration.       Lumbar back: She exhibits tenderness (tender over lumbar spine to the right and left radiating into the right buttocks and down the posterior right thigh to the knee).  Neurological: She is alert and oriented to person, place, and time. No sensory deficit.  Unable to perform romberg due to unsteady gait at baseline.  No sensory deficits.  No gross motor deficits.  Skin: Skin is warm and dry.  Vitals reviewed.         Assessment & Plan:  1. Neuritis or radiculitis due to rupture of lumbar intervertebral disc Take meloxicam 7.5 mg once daily as prescribed.  May take skelaxin 800mg  4 times daily as prescribed for pain.  Continue heating pad to back as needed.  May use aspercreme/ben-gay/icy hot to the affected area as needed.  - meloxicam (MOBIC) 7.5 MG tablet; Take 1 tablet (7.5 mg total) by mouth daily.  Dispense: 30 tablet; Refill: 0  2. DDD (degenerative disc disease), lumbar Take meloxicam 7.5 mg once daily as prescribed.  May take skelaxin 800mg  4 times daily as prescribed for  pain.  Continue heating pad to back as needed.  May use aspercreme/ben-gay/icy hot to the affected area as needed.May require xray if symptoms persist.

## 2014-11-18 NOTE — Patient Instructions (Addendum)
Take meloxicam 7.5 mg once daily for 10 days as prescribed.  May take skelaxin 800mg  4 times daily as prescribed for pain.  Continue heating pad to back as needed.  May use aspercreme/ben-gay/icy hot to the affected area as needed.  Sciatica Sciatica is pain, weakness, numbness, or tingling along the path of the sciatic nerve. The nerve starts in the lower back and runs down the back of each leg. The nerve controls the muscles in the lower leg and in the back of the knee, while also providing sensation to the back of the thigh, lower leg, and the sole of your foot. Sciatica is a symptom of another medical condition. For instance, nerve damage or certain conditions, such as a herniated disk or bone spur on the spine, pinch or put pressure on the sciatic nerve. This causes the pain, weakness, or other sensations normally associated with sciatica. Generally, sciatica only affects one side of the body. CAUSES   Herniated or slipped disc.  Degenerative disk disease.  A pain disorder involving the narrow muscle in the buttocks (piriformis syndrome).  Pelvic injury or fracture.  Pregnancy.  Tumor (rare). SYMPTOMS  Symptoms can vary from mild to very severe. The symptoms usually travel from the low back to the buttocks and down the back of the leg. Symptoms can include:  Mild tingling or dull aches in the lower back, leg, or hip.  Numbness in the back of the calf or sole of the foot.  Burning sensations in the lower back, leg, or hip.  Sharp pains in the lower back, leg, or hip.  Leg weakness.  Severe back pain inhibiting movement. These symptoms may get worse with coughing, sneezing, laughing, or prolonged sitting or standing. Also, being overweight may worsen symptoms. DIAGNOSIS  Your caregiver will perform a physical exam to look for common symptoms of sciatica. He or she may ask you to do certain movements or activities that would trigger sciatic nerve pain. Other tests may be performed  to find the cause of the sciatica. These may include:  Blood tests.  X-rays.  Imaging tests, such as an MRI or CT scan. TREATMENT  Treatment is directed at the cause of the sciatic pain. Sometimes, treatment is not necessary and the pain and discomfort goes away on its own. If treatment is needed, your caregiver may suggest:  Over-the-counter medicines to relieve pain.  Prescription medicines, such as anti-inflammatory medicine, muscle relaxants, or narcotics.  Applying heat or ice to the painful area.  Steroid injections to lessen pain, irritation, and inflammation around the nerve.  Reducing activity during periods of pain.  Exercising and stretching to strengthen your abdomen and improve flexibility of your spine. Your caregiver may suggest losing weight if the extra weight makes the back pain worse.  Physical therapy.  Surgery to eliminate what is pressing or pinching the nerve, such as a bone spur or part of a herniated disk. HOME CARE INSTRUCTIONS   Only take over-the-counter or prescription medicines for pain or discomfort as directed by your caregiver.  Apply ice to the affected area for 20 minutes, 3-4 times a day for the first 48-72 hours. Then try heat in the same way.  Exercise, stretch, or perform your usual activities if these do not aggravate your pain.  Attend physical therapy sessions as directed by your caregiver.  Keep all follow-up appointments as directed by your caregiver.  Do not wear high heels or shoes that do not provide proper support.  Check your mattress  to see if it is too soft. A firm mattress may lessen your pain and discomfort. SEEK IMMEDIATE MEDICAL CARE IF:   You lose control of your bowel or bladder (incontinence).  You have increasing weakness in the lower back, pelvis, buttocks, or legs.  You have redness or swelling of your back.  You have a burning sensation when you urinate.  You have pain that gets worse when you lie down or  awakens you at night.  Your pain is worse than you have experienced in the past.  Your pain is lasting longer than 4 weeks.  You are suddenly losing weight without reason. MAKE SURE YOU:  Understand these instructions.  Will watch your condition.  Will get help right away if you are not doing well or get worse. Document Released: 05/30/2001 Document Revised: 12/05/2011 Document Reviewed: 10/15/2011 [facility] Patient Information 2015 [place], [place]. This information is not intended to replace advice given to you by your health care provider. Make sure you discuss any questions you have with your health care provider.

## 2014-11-23 ENCOUNTER — Ambulatory Visit
Admission: RE | Admit: 2014-11-23 | Discharge: 2014-11-23 | Disposition: A | Discharge status: Home / Self Care | Payer: Medicare Other | Source: Ambulatory Visit | Attending: Physician Assistant | Admitting: Physician Assistant

## 2014-11-23 ENCOUNTER — Telehealth: Payer: Self-pay

## 2014-11-23 ENCOUNTER — Telehealth: Payer: Self-pay | Admitting: Physician Assistant

## 2014-11-23 DIAGNOSIS — M5136 Other intervertebral disc degeneration, lumbar region: Secondary | ICD-10-CM | POA: Diagnosis not present

## 2014-11-23 DIAGNOSIS — M419 Scoliosis, unspecified: Secondary | ICD-10-CM | POA: Insufficient documentation

## 2014-11-23 DIAGNOSIS — M4854XS Collapsed vertebra, not elsewhere classified, thoracic region, sequela of fracture: Secondary | ICD-10-CM | POA: Insufficient documentation

## 2014-11-23 DIAGNOSIS — M25551 Pain in right hip: Secondary | ICD-10-CM

## 2014-11-23 DIAGNOSIS — M5116 Intervertebral disc disorders with radiculopathy, lumbar region: Secondary | ICD-10-CM

## 2014-11-23 DIAGNOSIS — R109 Unspecified abdominal pain: Secondary | ICD-10-CM | POA: Insufficient documentation

## 2014-11-23 DIAGNOSIS — M4856XS Collapsed vertebra, not elsewhere classified, lumbar region, sequela of fracture: Secondary | ICD-10-CM | POA: Diagnosis not present

## 2014-11-23 DIAGNOSIS — M47816 Spondylosis without myelopathy or radiculopathy, lumbar region: Secondary | ICD-10-CM | POA: Diagnosis not present

## 2014-11-23 DIAGNOSIS — M4186 Other forms of scoliosis, lumbar region: Secondary | ICD-10-CM | POA: Diagnosis not present

## 2014-11-23 NOTE — Telephone Encounter (Signed)
-----   Message from Mar Daring, [place] sent at 11/23/2014  1:02 PM EDT ----- Stable osteoarthritic changes with chronic compression fractures noted at T11 and L1.  If pain is not improved can refer to ortho.  Thanks! -JB

## 2014-11-23 NOTE — Telephone Encounter (Signed)
Pt stated she was here on 11/18/14 and turned down getting an X ray for her hip pain but would now like to get one this afternoon if possible. Thanks TNP

## 2014-11-23 NOTE — Telephone Encounter (Signed)
Patient advised as directed below. Patient states the pain is better today and will hold off on referral to Ortho. NW

## 2014-11-23 NOTE — Telephone Encounter (Signed)
Orders placed.  She may go to [facility] outpatient imaging at her discretion.  Thanks! -JB

## 2014-11-23 NOTE — Telephone Encounter (Signed)
Patient advised order for x ray has been sent to [facility]. NW

## 2014-11-30 ENCOUNTER — Telehealth: Payer: Self-pay | Admitting: Family Medicine

## 2014-11-30 DIAGNOSIS — M5136 Other intervertebral disc degeneration, lumbar region: Secondary | ICD-10-CM

## 2014-11-30 DIAGNOSIS — K130 Diseases of lips: Secondary | ICD-10-CM | POA: Diagnosis not present

## 2014-11-30 DIAGNOSIS — L578 Other skin changes due to chronic exposure to nonionizing radiation: Secondary | ICD-10-CM | POA: Diagnosis not present

## 2014-11-30 DIAGNOSIS — L570 Actinic keratosis: Secondary | ICD-10-CM | POA: Diagnosis not present

## 2014-11-30 DIAGNOSIS — M5116 Intervertebral disc disorders with radiculopathy, lumbar region: Secondary | ICD-10-CM

## 2014-11-30 DIAGNOSIS — L821 Other seborrheic keratosis: Secondary | ICD-10-CM | POA: Diagnosis not present

## 2014-11-30 DIAGNOSIS — L219 Seborrheic dermatitis, unspecified: Secondary | ICD-10-CM | POA: Diagnosis not present

## 2014-11-30 MED ORDER — LIDOCAINE 5 % EX PTCH: 1.0000 | MEDICATED_PATCH | CUTANEOUS | Status: DC

## 2014-11-30 NOTE — Telephone Encounter (Signed)
Rx for lidoderm patch sent in to [facility].  She is to place one patch over the area causing her the most pain.  Change once daily.  I will also put in order for MRI lumbar spine to make sure there is nothing new the xray may have missed.  Thanks! -JB

## 2014-11-30 NOTE — Telephone Encounter (Signed)
Patient requested a Ortho referral. Patient also wanted to know if she should continue taking the Mobic? Patient states the RX was written for 10 days, but she is still having a lot of pain. Please advise.

## 2014-11-30 NOTE — Telephone Encounter (Signed)
Ortho referral placed.  See if she has been taking the skelaxin.  If so is there any relief with taking that?  We can try another short dose of Mobic as prescribed before if she is tolerating it well.  If having break through pain on Mobic may consider a pain patch.  Thanks!-JB

## 2014-11-30 NOTE — Telephone Encounter (Signed)
Patient advised as directed. Patient states she is taking Skelaxin, but she can't tell if it is helping relieve the pain. Patient also states she is tolerating the Mobic well, but has break through pain early morning and evening. Patient said she would be willing to try a pain patch if she can not get in with Ortho soon. Pharmacy- [facility] on [address].

## 2014-11-30 NOTE — Telephone Encounter (Signed)
Patient advised as directed. Patient verbalized understanding.

## 2014-11-30 NOTE — Telephone Encounter (Signed)
Pt stated she is still in a lot of pain and thinks it is time to see an Orthopedist. Pt would like to get a referral. Pt would also like to speak with a nurse about the Mobic and should pt continue to take it.  Thanks TNP

## 2014-12-04 ENCOUNTER — Telehealth: Payer: Self-pay | Admitting: Family Medicine

## 2014-12-04 DIAGNOSIS — M5136 Other intervertebral disc degeneration, lumbar region: Secondary | ICD-10-CM

## 2014-12-04 DIAGNOSIS — M48061 Spinal stenosis, lumbar region without neurogenic claudication: Secondary | ICD-10-CM

## 2014-12-04 DIAGNOSIS — M5116 Intervertebral disc disorders with radiculopathy, lumbar region: Secondary | ICD-10-CM

## 2014-12-04 NOTE — Telephone Encounter (Signed)
Pt stated she saw Jenne on 11/18/14 and the Mobic and the patches that [facility] sent in are not helping and pt wanted to know what else she could try. Pharmacy: [facility] [address] Thanks TNP

## 2014-12-04 NOTE — Telephone Encounter (Signed)
Please review message

## 2014-12-04 NOTE — Telephone Encounter (Signed)
She probably needs to be re-evaluated again.  She is already on everything she could possibly take.  I feel she should also have an upcoming appt with ortho, I think we were trying to get her there.  In the interim we can get an MRI as well if she would like to proceed forth with that.

## 2014-12-08 ENCOUNTER — Ambulatory Visit
Admission: RE | Admit: 2014-12-08 | Discharge: 2014-12-08 | Disposition: A | Discharge status: Home / Self Care | Payer: Medicare Other | Source: Ambulatory Visit | Attending: Physician Assistant | Admitting: Physician Assistant

## 2014-12-08 DIAGNOSIS — M5116 Intervertebral disc disorders with radiculopathy, lumbar region: Secondary | ICD-10-CM | POA: Diagnosis not present

## 2014-12-08 DIAGNOSIS — M4806 Spinal stenosis, lumbar region: Secondary | ICD-10-CM | POA: Diagnosis not present

## 2014-12-08 DIAGNOSIS — M5136 Other intervertebral disc degeneration, lumbar region: Secondary | ICD-10-CM | POA: Insufficient documentation

## 2014-12-09 MED ORDER — OXYCODONE-ACETAMINOPHEN 10-325 MG PO TABS: 1.0000 | ORAL_TABLET | ORAL | Status: DC | PRN

## 2014-12-09 MED ORDER — PREDNISONE 10 MG PO TABS: ORAL_TABLET | ORAL | Status: DC

## 2014-12-09 NOTE — Telephone Encounter (Signed)
Spoke with patient on the phone for MRI results.  Will try prednisone and percocet.  See me on June 29th at 4pm.

## 2014-12-15 ENCOUNTER — Other Ambulatory Visit: Payer: Self-pay

## 2014-12-16 ENCOUNTER — Ambulatory Visit (INDEPENDENT_AMBULATORY_CARE_PROVIDER_SITE_OTHER): Payer: Medicare Other | Admitting: Physician Assistant

## 2014-12-16 ENCOUNTER — Encounter: Payer: Self-pay | Admitting: Physician Assistant

## 2014-12-16 VITALS — BP 128/54 | HR 74 | Temp 97.9°F | Resp 16 | Wt 151.0 lb

## 2014-12-16 DIAGNOSIS — M4806 Spinal stenosis, lumbar region: Secondary | ICD-10-CM | POA: Diagnosis not present

## 2014-12-16 DIAGNOSIS — S32000A Wedge compression fracture of unspecified lumbar vertebra, initial encounter for closed fracture: Secondary | ICD-10-CM | POA: Insufficient documentation

## 2014-12-16 DIAGNOSIS — M79604 Pain in right leg: Secondary | ICD-10-CM

## 2014-12-16 DIAGNOSIS — M5136 Other intervertebral disc degeneration, lumbar region: Secondary | ICD-10-CM | POA: Diagnosis not present

## 2014-12-16 DIAGNOSIS — M5116 Intervertebral disc disorders with radiculopathy, lumbar region: Secondary | ICD-10-CM | POA: Diagnosis not present

## 2014-12-16 DIAGNOSIS — S32000S Wedge compression fracture of unspecified lumbar vertebra, sequela: Secondary | ICD-10-CM | POA: Diagnosis not present

## 2014-12-16 DIAGNOSIS — M5416 Radiculopathy, lumbar region: Secondary | ICD-10-CM

## 2014-12-16 DIAGNOSIS — I6523 Occlusion and stenosis of bilateral carotid arteries: Secondary | ICD-10-CM

## 2014-12-16 DIAGNOSIS — M48061 Spinal stenosis, lumbar region without neurogenic claudication: Secondary | ICD-10-CM

## 2014-12-16 MED ORDER — OXYCODONE-ACETAMINOPHEN 10-325 MG PO TABS: 1.0000 | ORAL_TABLET | Freq: Four times a day (QID) | ORAL | Status: DC | PRN

## 2014-12-16 NOTE — Patient Instructions (Signed)
Lumbosacral Radiculopathy Lumbosacral radiculopathy is a pinched nerve or nerves in the low back (lumbosacral area). When this happens you may have weakness in your legs and may not be able to stand on your toes. You may have pain going down into your legs. There may be difficulties with walking normally. There are many causes of this problem. Sometimes this may happen from an injury, or simply from arthritis or boney problems. It may also be caused by other illnesses such as diabetes. If there is no improvement after treatment, further studies may be done to find the exact cause. DIAGNOSIS  X-rays may be needed if the problems become long standing. Electromyograms may be done. This study is one in which the working of nerves and muscles is studied. HOME CARE INSTRUCTIONS   Applications of ice packs may be helpful. Ice can be used in a plastic bag with a towel around it to prevent frostbite to skin. This may be used every 2 hours for 20 to 30 minutes, or as needed, while awake, or as directed by your caregiver.  Only take over-the-counter or prescription medicines for pain, discomfort, or fever as directed by your caregiver.  If physical therapy was prescribed, follow your caregiver's directions. SEEK IMMEDIATE MEDICAL CARE IF:   You have pain not controlled with medications.  You seem to be getting worse rather than better.  You develop increasing weakness in your legs.  You develop loss of bowel or bladder control.  You have difficulty with walking or balance, or develop clumsiness in the use of your legs.  You have a fever. MAKE SURE YOU:   Understand these instructions.  Will watch your condition.  Will get help right away if you are not doing well or get worse. Document Released: 06/05/2005 Document Revised: 08/28/2011 Document Reviewed: 01/24/2008 [facility] Patient Information 2015 [place], [place]. This information is not intended to replace advice given to you by your health  care provider. Make sure you discuss any questions you have with your health care provider. Spinal Stenosis Spinal stenosis is an abnormal narrowing of the canals of your spine (vertebrae). CAUSES  Spinal stenosis is caused by areas of bone pushing into the central canals of your vertebrae. This condition can be present at birth (congenital). It also may be caused by arthritic deterioration of your vertebrae (spinal degeneration).  SYMPTOMS   Pain that is generally worse with activities, particularly standing and walking.  Numbness, tingling, hot or cold sensations, weakness, or weariness in your legs.  Frequent episodes of falling.  A foot-slapping gait that leads to muscle weakness. DIAGNOSIS  Spinal stenosis is diagnosed with the use of magnetic resonance imaging (MRI) or computed tomography (CT). TREATMENT  Initial therapy for spinal stenosis focuses on the management of the pain and other symptoms associated with the condition. These therapies include:  Practicing postural changes to lessen pressure on your nerves.  Exercises to strengthen the core of your body.  Loss of excess body weight.  The use of nonsteroidal anti-inflammatory medicines to reduce swelling and inflammation in your nerves. When therapies to manage pain are not successful, surgery to treat spinal stenosis may be recommended. This surgery involves removing excess bone, which puts pressure on your nerve roots. During this surgery (laminectomy), the posterior boney arch (lamina) and excess bone around the facet joints are removed. Document Released: 08/26/2003 Document Revised: 10/20/2013 Document Reviewed: 09/13/2012 [facility] Patient Information 2015 [place], [place]. This information is not intended to replace advice given to you by your  health care provider. Make sure you discuss any questions you have with your health care provider.

## 2014-12-16 NOTE — Progress Notes (Signed)
Subjective:    Patient ID: Chelsea [name], female    DOB: 1924/05/05, 81 y.o.   MRN: [id number]  HPI Comments: MRI RESULTS:  EXAM: MRI LUMBAR SPINE WITHOUT CONTRAST  TECHNIQUE: Multiplanar, multisequence MR imaging of the lumbar spine was performed. No intravenous contrast was administered.  COMPARISON: Radiographs dated 11/23/2014, CT scan dated 06/25/2014 and MRI dated 12/29/2011  FINDINGS: Normal conus tip at L1-2. 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 Large hiatal hernia.  T11-12: Old compression fracture of the superior endplate of Y69. No disc protrusion or bulging.  T11-12: Negative.  T12-L1: Old compression fractures of the inferior endplate of S85 in the superior endplate of L1. Broad-based disc bulge and accompanying osteophytes without neural impingement.  L1-2: Small disc protrusion into the right neural foramen. Small disc bulge into the left neural foramen. Subtle compression fracture of the superior endplate of L2 with no bone protrusion into the spinal canal.  L2-3: Slight benign appearing compression fracture of the inferior endplate of L2. Small broad-based disc bulge without neural impingement. Hypertrophy of the ligamentum flavum and facet joints slightly impinge upon the thecal sac posterior laterally, left more than right.  L3-4: Small broad-based disc bulge with no neural impingement. Moderate hypertrophy of the facet joints and ligamentum flavum without neural impingement. Slight narrowing of the spinal canal.  L4-5: Severe bilateral facet arthritis with hypertrophy of the facet joints and ligamentum flavum with a 4.5 mm spondylolisthesis with a diffuse prominent bulge of the uncovered disc asymmetric to the right and into the right neural foramen. There is moderately severe spinal stenosis with severe compression of the right lateral recess and moderately severe compression of the left lateral  recess. I suspect the right L5 nerve is compressed. Old compression fracture of the superior endplate of L5.  L5-S1: Small broad-based disc bulge with a small protrusion into the left neural foramen without neural impingement. Slight bilateral facet arthritis.  IMPRESSION: 1. Moderately severe spinal stenosis at L4-5 with severe right and moderately severe left lateral recess stenosis. I think the right L5 nerve is compressed at this level. 2. Subacute benign appearing compression fractures of the superior and inferior endplates of L2 without neural impingement.   Electronically Signed  By: Lorriane Shire M.D.  On: 12/08/2014 16:20  Back Pain This is a chronic problem. The current episode started 1 to 4 weeks ago (5 days ago). The problem occurs constantly. The problem has been waxing and waning (worse in the morning) since onset. The pain is present in the lumbar spine and gluteal. The quality of the pain is described as aching, stabbing, burning and shooting. The pain radiates to the right thigh and right knee. The pain is at a severity of 7/10. The pain is severe. The pain is worse during the day (morning). The symptoms are aggravated by standing, twisting and sitting. Stiffness is present in the morning. Associated symptoms include leg pain, numbness (down right leg), tingling (down right leg to knee) and weakness. Pertinent negatives include no abdominal pain, bladder incontinence, bowel incontinence, chest pain, dysuria, fever, headaches, paresis, paresthesias, pelvic pain, perianal numbness or weight loss. Risk factors include history of osteoporosis, menopause, poor posture and lack of exercise. She has tried bed rest, heat and muscle relaxant for the symptoms. The treatment provided mild relief.      Review of Systems  Constitutional: Positive for activity change. Negative for fever, chills, weight loss, appetite change and unexpected weight change.  Cardiovascular:  Negative for chest  pain and leg swelling.  Gastrointestinal: Negative for nausea, vomiting, abdominal pain, diarrhea, constipation and bowel incontinence.  Genitourinary: Negative for bladder incontinence, dysuria, hematuria, difficulty urinating and pelvic pain.  Musculoskeletal: Positive for back pain.  Neurological: Positive for tingling (down right leg to knee), weakness and numbness (down right leg). Negative for dizziness, headaches and paresthesias.       Objective:   Physical Exam  Constitutional: She is oriented to person, place, and time. She appears well-developed and well-nourished. No distress.  HENT:  Head: Normocephalic and atraumatic.  Neck: Normal range of motion. Neck supple.  Cardiovascular: Normal rate, regular rhythm and normal heart sounds.  Exam reveals no gallop and no friction rub.   No murmur heard. Pulmonary/Chest: Effort normal and breath sounds normal. No respiratory distress. She has no wheezes. She has no rales.  Musculoskeletal:       Right hip: She exhibits decreased range of motion, decreased strength and tenderness. She exhibits no bony tenderness, no swelling, no crepitus, no deformity and no laceration.       Left hip: She exhibits decreased strength. She exhibits normal range of motion, no tenderness, no swelling, no crepitus, no deformity and no laceration.       Lumbar back: She exhibits tenderness (tender over lumbar spine to the right and left radiating into the right buttocks and down the posterior right thigh to the knee).  Neurological: She is alert and oriented to person, place, and time. She has normal reflexes. No sensory deficit.  Unable to perform romberg due to unsteady gait at baseline.  No sensory deficits.  No gross motor deficits.  Skin: Skin is warm and dry.  Vitals reviewed.         Assessment & Plan:  1. DDD (degenerative disc disease), lumbar She is only taking one per day in the morning. - oxyCODONE-acetaminophen (PERCOCET)  10-325 MG per tablet; Take 1 tablet by mouth every 6 (six) hours as needed for pain.  Dispense: 30 tablet; Refill: 0  2. Spinal stenosis at L4-L5 level Known history.  Seen by Dr. Sharlet Salina for spinal injections.  Will call to set up follow up appointment.  May consider nerve root block of L5 if she is a candidate for pain control.  3. Lumbar nerve root compression  L5 now with spondylolisthesis measuring 4.5 mm with diffuse uncovered disc bulge into the right neural foramen.  Also moderately severe spinal stenosis with severe compression of right lateral recess and possible L5 nerve compression on the right.  Will have her follow up with Dr. Sharlet Salina.  If necessary may consider neurosurgery referral.  She does not wish to have surgical intervention but is just wanting pain relief.  4. Lumbar canal stenosis Known history of this.  5. Pain of right lower extremity Secondary to nerve root compression of L5.  6. Compression fracture of lumbar vertebra, sequela Old compression fractures noted on lumbar MRI from T12-L1, L2 superior and inferior endplate, and L5 superior endplate.

## 2014-12-17 ENCOUNTER — Telehealth: Payer: Self-pay | Admitting: Physician Assistant

## 2014-12-17 DIAGNOSIS — G549 Nerve root and plexus disorder, unspecified: Secondary | ICD-10-CM

## 2014-12-17 NOTE — Telephone Encounter (Signed)
Pt states that you were going to refer her to Dr Sharlet Salina.Please add referral to Epic.Pt wants this done ASAP,Thanks

## 2014-12-17 NOTE — Telephone Encounter (Signed)
I am sorry she had said she would set up the appointment herself as she is already a patient of his.  Referral order has been placed.  Thanks.

## 2014-12-23 ENCOUNTER — Other Ambulatory Visit: Payer: Self-pay | Admitting: Family Medicine

## 2014-12-23 DIAGNOSIS — K219 Gastro-esophageal reflux disease without esophagitis: Secondary | ICD-10-CM | POA: Insufficient documentation

## 2014-12-25 DIAGNOSIS — M5416 Radiculopathy, lumbar region: Secondary | ICD-10-CM | POA: Diagnosis not present

## 2014-12-25 DIAGNOSIS — M4806 Spinal stenosis, lumbar region: Secondary | ICD-10-CM | POA: Diagnosis not present

## 2014-12-25 DIAGNOSIS — M5136 Other intervertebral disc degeneration, lumbar region: Secondary | ICD-10-CM | POA: Diagnosis not present

## 2014-12-29 ENCOUNTER — Ambulatory Visit
Admission: RE | Admit: 2014-12-29 | Discharge: 2014-12-29 | Disposition: A | Discharge status: Home / Self Care | Payer: Medicare Other | Source: Ambulatory Visit | Attending: Family Medicine | Admitting: Family Medicine

## 2014-12-29 ENCOUNTER — Encounter: Payer: Self-pay | Admitting: Family Medicine

## 2014-12-29 ENCOUNTER — Other Ambulatory Visit: Payer: Self-pay

## 2014-12-29 ENCOUNTER — Ambulatory Visit (INDEPENDENT_AMBULATORY_CARE_PROVIDER_SITE_OTHER): Payer: Medicare Other | Admitting: Family Medicine

## 2014-12-29 VITALS — BP 132/54 | HR 75 | Temp 99.4°F | Resp 16

## 2014-12-29 DIAGNOSIS — K449 Diaphragmatic hernia without obstruction or gangrene: Secondary | ICD-10-CM | POA: Diagnosis not present

## 2014-12-29 DIAGNOSIS — R05 Cough: Secondary | ICD-10-CM | POA: Diagnosis present

## 2014-12-29 DIAGNOSIS — R509 Fever, unspecified: Secondary | ICD-10-CM | POA: Insufficient documentation

## 2014-12-29 DIAGNOSIS — R918 Other nonspecific abnormal finding of lung field: Secondary | ICD-10-CM | POA: Diagnosis not present

## 2014-12-29 DIAGNOSIS — R0989 Other specified symptoms and signs involving the circulatory and respiratory systems: Secondary | ICD-10-CM | POA: Diagnosis not present

## 2014-12-29 DIAGNOSIS — X58XXXA Exposure to other specified factors, initial encounter: Secondary | ICD-10-CM | POA: Diagnosis not present

## 2014-12-29 DIAGNOSIS — I6523 Occlusion and stenosis of bilateral carotid arteries: Secondary | ICD-10-CM

## 2014-12-29 DIAGNOSIS — K5900 Constipation, unspecified: Secondary | ICD-10-CM | POA: Diagnosis not present

## 2014-12-29 DIAGNOSIS — K5909 Other constipation: Secondary | ICD-10-CM

## 2014-12-29 DIAGNOSIS — M4854XA Collapsed vertebra, not elsewhere classified, thoracic region, initial encounter for fracture: Secondary | ICD-10-CM | POA: Insufficient documentation

## 2014-12-29 MED ORDER — LEVOFLOXACIN 500 MG PO TABS: 500.0000 mg | ORAL_TABLET | Freq: Every day | ORAL | Status: DC

## 2014-12-29 NOTE — Progress Notes (Signed)
Subjective:    Patient ID: Chelsea [name], female    DOB: Jan 21, 1924, 81 y.o.   MRN: [id number] Chief Complaint  Patient presents with  . Fever  . Chills    HPI  This 80 year old female developed cough and congestion with post nasal drip over the past 2 days. Fever has spiked to 100.8 today. Has a history of COPD and followed by Dr. Raul Del.Still using Albuterol once a day and Breo daily. Still having some trouble with constipation and soreness in left abdomen since the cough started. No blood in stools. Taking Senna-Lax 8.6 mg 3-4 times a day and still getting dry pellet-like stools with one loose stool yesterday. Presently on analgesics than can cause opioid constipation.  Past Medical History  Diagnosis Date  . Coronary artery disease     s/p CABG  . Hyperlipidemia   . Hypertension   . Bradycardia     hx of it and fatigue with beta blockade  . History of hyperkalemia   . CVD (cerebrovascular disease)     s/p carotid endarterectomy  . Stroke 1980s    left brain  . Mitral valve prolapse   . Osteoporosis   . Asthma   . Degenerative disc disease   . Spinal stenosis in cervical region   . Norovirus 2014   History  Substance Use Topics  . Smoking status: Former Research scientist (life sciences)  . Smokeless tobacco: Never Used     Comment: quit 50 years ago  . Alcohol Use: No   Past Surgical History  Procedure Laterality Date  . Coronary artery bypass graft    . Carotid endarterectomy      left  . Cholecystectomy    . Abdominal hysterectomy    . Bladder suspension    . Hip surgery  02/2011    right   Family History  Problem Relation Age of Onset  . Throat cancer Mother   . Stroke Father    Current Outpatient Prescriptions on File Prior to Visit  Medication Sig Dispense Refill  . albuterol (PROVENTIL HFA;VENTOLIN HFA) 108 (90 BASE) MCG/ACT inhaler Inhale 2 puffs into the lungs every 6 (six) hours as needed.    Marland Kitchen alendronate (FOSAMAX) 70 MG tablet Take 70 mg by mouth once a week. Take  with a full glass of water on an empty stomach.    Marland Kitchen amLODipine (NORVASC) 10 MG tablet Take 10 mg by mouth daily.    Marland Kitchen aspirin 81 MG tablet Take 81 mg by mouth daily.    Marland Kitchen atorvastatin (LIPITOR) 80 MG tablet Take 1 tablet (80 mg total) by mouth daily. 90 tablet 4  . BREO ELLIPTA 100-25 MCG/INH AEPB     . diazepam (VALIUM) 5 MG tablet Take 5 mg by mouth every 6 (six) hours as needed.      . fluticasone (FLONASE) 50 MCG/ACT nasal spray Place 2 sprays into both nostrils daily.    . hydrALAZINE (APRESOLINE) 25 MG tablet Take 25 mg by mouth 4 (four) times daily.    Marland Kitchen ketoconazole (NIZORAL) 2 % shampoo MESSAGE SHAMPOO INTO THE SCALP, LET SIT FOR 5 MINUTES THEN RINSE OUT AS DIRECTED  0  . lansoprazole (PREVACID) 30 MG capsule TAKE 1 CAPSULE TWICE DAILY 180 capsule 3  . levothyroxine (SYNTHROID, LEVOTHROID) 88 MCG tablet 1 tablet daily.  0  . loratadine (ALLERGY RELIEF) 10 MG tablet Take 10 mg by mouth daily.    Marland Kitchen LORazepam (ATIVAN) 0.5 MG tablet Take 0.5 mg by mouth as  needed.     . meloxicam (MOBIC) 7.5 MG tablet Take 1 tablet (7.5 mg total) by mouth daily. 30 tablet 0  . metaxalone (SKELAXIN) 800 MG tablet Take 800 mg by mouth as needed for muscle spasms.    . montelukast (SINGULAIR) 10 MG tablet Take 10 mg by mouth at bedtime.    . Multiple Minerals-Vitamins (CALCIUM CITRATE +) TABS Take 1 tablet by mouth daily.      . nitroGLYCERIN (NITROSTAT) 0.4 MG SL tablet Place 1 tablet (0.4 mg total) under the tongue every 5 (five) minutes as needed. 25 tablet 6  . oxybutynin (DITROPAN) 5 MG tablet Take 5 mg by mouth daily.    Marland Kitchen oxyCODONE-acetaminophen (PERCOCET) 10-325 MG per tablet Take 1 tablet by mouth every 6 (six) hours as needed for pain. 30 tablet 0  . RA SENNA 8.6 MG tablet Take 1 tablet by mouth 2 (two) times daily.  0  . sertraline (ZOLOFT) 100 MG tablet Take 150 mg by mouth daily.     Marland Kitchen tolterodine (DETROL LA) 4 MG 24 hr capsule Take 4 mg by mouth daily.       No current facility-administered  medications on file prior to visit.   Allergies  Allergen Reactions  . Sulfonamide Derivatives    Review of Systems  Constitutional: Positive for fever, chills and fatigue.  HENT: Positive for congestion, postnasal drip and sinus pressure. Negative for ear discharge, ear pain and nosebleeds.   Respiratory: Positive for cough and wheezing.   Cardiovascular: Negative for chest pain, palpitations and leg swelling.  Gastrointestinal: Positive for abdominal pain and constipation. Negative for blood in stool.       Left upper quadrant with deep breath      BP 132/54 mmHg  Pulse 75  Temp(Src) 99.4 F (37.4 C) (Oral)  Resp 16  SpO2 90%  Objective:   Physical Exam  Constitutional: She is oriented to person, place, and time. She appears well-nourished.  General malaise with some fever.  HENT:  Head: Normocephalic and atraumatic.  Right Ear: External ear normal.  Left Ear: External ear normal.  Nose: Nose normal.  Mouth/Throat: Oropharynx is clear and moist.  Some tenderness in frontal, ethmoid and maxillary sinuses to palpate.  Eyes: Conjunctivae and EOM are normal. Pupils are equal, round, and reactive to light.  Neck: Normal range of motion. Neck supple.  Cardiovascular: Normal rate, regular rhythm and normal heart sounds.   Pulmonary/Chest: Effort normal. She has rales.  Left posterior and lateral lung rales.  Abdominal: Soft. There is tenderness. There is no rebound and no guarding.  Mild tenderness in LUQ near costal margin and lateral ribs.  Neurological: She is alert and oriented to person, place, and time.      Assessment & Plan:  1. Fever and chills Onset of cough and congestion with fever yesterday. Highest fever was 100.8 today. Has not take anything other than routine medications (Tylenol in Percocet may be helping control temperature). Will check CBC with diff and chest x-ray to rule out pneumonia. Increase fluid intake and recheck pending reports on labs and CXR. -  CBC with Differential/Platelet - DG Chest 2 View  2. Chest rales Heard in the left posterior base. Suspect pneumonia. Will treat with Levaquin and recheck pending lab and x-ray reports. - levofloxacin (LEVAQUIN) 500 MG tablet; Take 1 tablet (500 mg total) by mouth daily.  Dispense: 7 tablet; Refill: 0  3. Chronic constipation Still having some dry "pellet-like" stools with initiation of BM.  Still using Senna-Lax with little relief. May add Glycerine suppositories prn and increase fluid intake. Recheck prn. Suspect constipation secondary to opiate analgesic use.

## 2014-12-30 ENCOUNTER — Telehealth: Payer: Self-pay | Admitting: Family Medicine

## 2014-12-30 LAB — CBC WITH DIFFERENTIAL/PLATELET
BASOS ABS: 0 10*3/uL (ref 0.0–0.2)
BASOS: 0 %
EOS (ABSOLUTE): 0.2 10*3/uL (ref 0.0–0.4)
EOS: 2 %
Hematocrit: 32 % — ABNORMAL LOW (ref 34.0–46.6)
Hemoglobin: 10.2 g/dL — ABNORMAL LOW (ref 11.1–15.9)
IMMATURE GRANS (ABS): 0 10*3/uL (ref 0.0–0.1)
Immature Granulocytes: 0 %
Lymphocytes Absolute: 2 10*3/uL (ref 0.7–3.1)
Lymphs: 19 %
MCH: 27 pg (ref 26.6–33.0)
MCHC: 31.9 g/dL (ref 31.5–35.7)
MCV: 85 fL (ref 79–97)
Monocytes Absolute: 1 10*3/uL — ABNORMAL HIGH (ref 0.1–0.9)
Monocytes: 9 %
NEUTROS PCT: 70 %
Neutrophils Absolute: 7.2 10*3/uL — ABNORMAL HIGH (ref 1.4–7.0)
PLATELETS: 327 10*3/uL (ref 150–379)
RBC: 3.78 x10E6/uL (ref 3.77–5.28)
RDW: 17.3 % — AB (ref 12.3–15.4)
WBC: 10.4 10*3/uL (ref 3.4–10.8)

## 2014-12-30 NOTE — Telephone Encounter (Signed)
Pt would like to get the results of her chest xray that was done 12/29/14. I advised that Simona Huh is out of the office and pt stated that she doesn't want to wait until tomorrow to find out if she has pneumonia. Can another provider looks at the results? Thanks TNP

## 2014-12-30 NOTE — Telephone Encounter (Signed)
No pneumonia noted. Please see how patient is doing.

## 2014-12-30 NOTE — Telephone Encounter (Signed)
Pt advised, she says she still has chest congestion, but it is taking her antibiotic.   Thanks,   -Mickel Baas

## 2014-12-31 ENCOUNTER — Telehealth: Payer: Self-pay

## 2014-12-31 NOTE — Telephone Encounter (Signed)
Patient advised as directed below. Patient verbalized understanding and agrees with treatment plan. 

## 2014-12-31 NOTE — Telephone Encounter (Signed)
-----   Message from [place], [place] sent at 12/31/2014  1:03 AM EDT ----- No sign of pneumonia on x-ray of chest. Some scarring at the left base of the lung probably from past infections or injuries to lungs. Blood count does not show any infection but a little anemic. Recommend a multivitamin with iron and recheck blood count in 3 months. Should finish all the antibiotic given and recheck as planned if still having cough.

## 2015-01-03 ENCOUNTER — Other Ambulatory Visit: Payer: Self-pay | Admitting: Family Medicine

## 2015-01-04 ENCOUNTER — Ambulatory Visit: Payer: Medicare Other | Admitting: Family Medicine

## 2015-01-15 ENCOUNTER — Encounter: Payer: Self-pay | Admitting: Family Medicine

## 2015-01-15 ENCOUNTER — Ambulatory Visit (INDEPENDENT_AMBULATORY_CARE_PROVIDER_SITE_OTHER): Payer: Medicare Other | Admitting: Family Medicine

## 2015-01-15 VITALS — BP 148/68 | HR 76 | Temp 97.4°F | Resp 16 | Wt 142.0 lb

## 2015-01-15 DIAGNOSIS — J441 Chronic obstructive pulmonary disease with (acute) exacerbation: Secondary | ICD-10-CM

## 2015-01-15 DIAGNOSIS — J449 Chronic obstructive pulmonary disease, unspecified: Secondary | ICD-10-CM | POA: Insufficient documentation

## 2015-01-15 DIAGNOSIS — I6523 Occlusion and stenosis of bilateral carotid arteries: Secondary | ICD-10-CM | POA: Diagnosis not present

## 2015-01-15 MED ORDER — SERTRALINE HCL 100 MG PO TABS: 150.0000 mg | ORAL_TABLET | Freq: Every day | ORAL | Status: DC

## 2015-01-15 MED ORDER — PREDNISONE 10 MG PO TABS: ORAL_TABLET | ORAL | Status: DC

## 2015-01-15 NOTE — Progress Notes (Signed)
Patient ID: Chelsea [name], female   DOB: 21-Nov-1923, 81 y.o.   MRN: [id number]    Subjective:  HPI Pt is here for a follow up of shortness of breath. Pt was seen by Simona Huh on 12/29/14.  She had fevers and chills, cough and Rales in left lung. He suspected Pneumonia. He ordered a CBC and CXR. She is not feeling any better since all of this. He treated her Levaquin. Her chest CXR showed no pneumonia. She is still having shortness of breath. She is sometimes having chills but has not had a fever anymore. She tried to get an appt with Dr. Vella Kohler but he was out of the office. She also reports that she is having back pain but she is about to have a back injection for this by ortho next week.   When checking her medications she has written down that she was taking Prednisone. prescribed last time she had pneumonia. She did not know the dose but was not sure why she was still taking it and what it was even for.  She is also having constipation. She was put on Senna before and she reports that it is not working.   Prior to Admission medications   Medication Sig Start Date End Date Taking? Authorizing Provider  albuterol (PROVENTIL HFA;VENTOLIN HFA) 108 (90 BASE) MCG/ACT inhaler Inhale 2 puffs into the lungs every 6 (six) hours as needed.   Yes Historical Provider, MD  alendronate (FOSAMAX) 70 MG tablet Take 70 mg by mouth once a week. Take with a full glass of water on an empty stomach.   Yes Historical Provider, MD  amLODipine (NORVASC) 10 MG tablet Take 10 mg by mouth daily.   Yes Historical Provider, MD  aspirin 81 MG tablet Take 81 mg by mouth daily.   Yes Historical Provider, MD  atorvastatin (LIPITOR) 80 MG tablet Take 1 tablet (80 mg total) by mouth daily. 11/03/14  Yes Minna Merritts, MD  beclomethasone (BECONASE-AQ) 42 MCG/SPRAY nasal spray Place into both nostrils 2 (two) times daily. Dose is for each nostril.   Yes Historical Provider, MD  BREO ELLIPTA 100-25 MCG/INH AEPB  08/14/14  Yes  Historical Provider, MD  diazepam (VALIUM) 5 MG tablet Take 5 mg by mouth every 6 (six) hours as needed.     Yes Historical Provider, MD  fluticasone (FLONASE) 50 MCG/ACT nasal spray Place 2 sprays into both nostrils daily.   Yes Historical Provider, MD  lansoprazole (PREVACID) 30 MG capsule TAKE 1 CAPSULE TWICE DAILY 12/23/14  Yes Margarita Rana, MD  levothyroxine (SYNTHROID, LEVOTHROID) 88 MCG tablet 1 tablet daily. 11/13/14  Yes Historical Provider, MD  LORazepam (ATIVAN) 0.5 MG tablet Take 0.5 mg by mouth as needed.  01/30/13  Yes Historical Provider, MD  metaxalone (SKELAXIN) 800 MG tablet Take 800 mg by mouth as needed for muscle spasms.   Yes Historical Provider, MD  montelukast (SINGULAIR) 10 MG tablet Take 10 mg by mouth at bedtime.   Yes Historical Provider, MD  Multiple Minerals-Vitamins (CALCIUM CITRATE +) TABS Take 1 tablet by mouth daily.     Yes Historical Provider, MD  Multiple Vitamins-Minerals (PRESERVISION AREDS PO) Take by mouth.   Yes Historical Provider, MD  nitroGLYCERIN (NITROSTAT) 0.4 MG SL tablet Place 1 tablet (0.4 mg total) under the tongue every 5 (five) minutes as needed. 08/27/13  Yes Minna Merritts, MD  oxyCODONE-acetaminophen (PERCOCET) 10-325 MG per tablet Take 1 tablet by mouth every 6 (six) hours as needed for pain.  12/16/14  Yes Jennifer M Burnette, PA-C  RA SENNA 8.6 MG tablet Take 1 tablet by mouth 2 (two) times daily. 10/05/14  Yes Historical Provider, MD  oxybutynin (DITROPAN) 5 MG tablet take 1 tablet by mouth at bedtime Patient not taking: Reported on 01/15/2015 01/04/15   Margarita Rana, MD  sertraline (ZOLOFT) 100 MG tablet Take 150 mg by mouth daily.     Historical Provider, MD    Patient Active Problem List   Diagnosis Date Noted  . GERD (gastroesophageal reflux disease) 12/23/2014  . Spinal stenosis at L4-L5 level 12/16/2014  . Lumbar nerve root compression 12/16/2014  . Compression fracture of lumbar vertebra 12/16/2014  . Lumbar canal stenosis  11/04/2013  . Neuritis or radiculitis due to rupture of lumbar intervertebral disc 11/04/2013  . DDD (degenerative disc disease), lumbar 11/04/2013  . Dermatophytosis of nail 03/31/2013  . Pain in limb 03/31/2013  . Preoperative cardiovascular examination 08/05/2012  . HTN (hypertension) 07/24/2011  . ABDOMINAL PAIN-GENERALIZED 07/21/2010  . Hyperlipidemia 01/31/2010  . DIZZINESS 01/31/2010  . Carotid artery stenosis 12/21/2009  . CORONARY ATHEROSLERO AUTOL VEIN BYPASS GRAFT 05/25/2009  . Dyspnea on exertion 05/25/2009    Past Medical History  Diagnosis Date  . Coronary artery disease     s/p CABG  . Hyperlipidemia   . Hypertension   . Bradycardia     hx of it and fatigue with beta blockade  . History of hyperkalemia   . CVD (cerebrovascular disease)     s/p carotid endarterectomy  . Stroke 1980s    left brain  . Mitral valve prolapse   . Osteoporosis   . Asthma   . Degenerative disc disease   . Spinal stenosis in cervical region   . Norovirus 2014    History   Social History  . Marital Status: Widowed    Spouse Name: N/A  . Number of Children: N/A  . Years of Education: N/A   Occupational History  . Not on file.   Social History Main Topics  . Smoking status: Former Research scientist (life sciences)  . Smokeless tobacco: Never Used     Comment: quit 50 years ago  . Alcohol Use: No  . Drug Use: No  . Sexual Activity: Not on file   Other Topics Concern  . Not on file   Social History Narrative    Allergies  Allergen Reactions  . Sulfonamide Derivatives     Review of Systems  Constitutional: Positive for malaise/fatigue.  HENT: Positive for congestion.   Eyes: Negative.   Respiratory: Positive for cough, shortness of breath and wheezing. Negative for sputum production.   Cardiovascular: Negative.   Gastrointestinal: Positive for constipation.  Genitourinary: Negative.   Musculoskeletal: Positive for back pain.  Skin: Negative.   Neurological: Negative.     Endo/Heme/Allergies: Negative.   Psychiatric/Behavioral: Negative.     Immunization History  Administered Date(s) Administered  . Influenza-Unspecified 03/19/2013   Objective:  BP 148/68 mmHg  Pulse 76  Temp(Src) 97.4 F (36.3 C) (Oral)  Resp 16  Wt 142 lb (64.411 kg)  Physical Exam  Constitutional: She is oriented to person, place, and time and well-developed, well-nourished, and in no distress.  Cardiovascular: Normal rate and regular rhythm.   Pulmonary/Chest: Effort normal and breath sounds normal.  Neurological: She is alert and oriented to person, place, and time.  Psychiatric: Mood, memory, affect and judgment normal.   Assessment and Plan:  1. COPD exacerbation- Not at baseline. Will treat with Prednisone.  Patient will call  back if condition worsens or does not continue to improve.    - predniSONE (DELTASONE) 10 MG tablet; 6 po for 1 day and then 5 po for 1 day and then 4 po for 1 day and 3 po for 1 day and then 2 po for 1 day and then 1 po for 1 day.  Dispense: 21 tablet; Refill: 0  2. Chronic obstructive pulmonary disease, unspecified COPD, unspecified chronic bronchitis type Continue inhalers and medication as previous.    Margarita Rana, MD

## 2015-01-21 DIAGNOSIS — M5416 Radiculopathy, lumbar region: Secondary | ICD-10-CM | POA: Diagnosis not present

## 2015-01-21 DIAGNOSIS — M5136 Other intervertebral disc degeneration, lumbar region: Secondary | ICD-10-CM | POA: Diagnosis not present

## 2015-01-21 DIAGNOSIS — M4806 Spinal stenosis, lumbar region: Secondary | ICD-10-CM | POA: Diagnosis not present

## 2015-02-08 ENCOUNTER — Ambulatory Visit: Payer: Federal, State, Local not specified - PPO | Admitting: Podiatry

## 2015-02-09 ENCOUNTER — Telehealth: Payer: Self-pay

## 2015-02-09 ENCOUNTER — Ambulatory Visit
Admission: RE | Admit: 2015-02-09 | Discharge: 2015-02-09 | Disposition: A | Discharge status: Home / Self Care | Payer: Medicare Other | Source: Ambulatory Visit | Attending: Family Medicine | Admitting: Family Medicine

## 2015-02-09 ENCOUNTER — Ambulatory Visit (INDEPENDENT_AMBULATORY_CARE_PROVIDER_SITE_OTHER): Payer: Medicare Other | Admitting: Family Medicine

## 2015-02-09 ENCOUNTER — Encounter: Payer: Self-pay | Admitting: Family Medicine

## 2015-02-09 ENCOUNTER — Other Ambulatory Visit: Payer: Self-pay

## 2015-02-09 VITALS — BP 118/52 | HR 78 | Temp 99.6°F | Resp 24

## 2015-02-09 DIAGNOSIS — I6523 Occlusion and stenosis of bilateral carotid arteries: Secondary | ICD-10-CM

## 2015-02-09 DIAGNOSIS — K529 Noninfective gastroenteritis and colitis, unspecified: Secondary | ICD-10-CM | POA: Insufficient documentation

## 2015-02-09 DIAGNOSIS — N3281 Overactive bladder: Secondary | ICD-10-CM | POA: Insufficient documentation

## 2015-02-09 DIAGNOSIS — J9811 Atelectasis: Secondary | ICD-10-CM | POA: Diagnosis not present

## 2015-02-09 DIAGNOSIS — D638 Anemia in other chronic diseases classified elsewhere: Secondary | ICD-10-CM | POA: Diagnosis not present

## 2015-02-09 DIAGNOSIS — K449 Diaphragmatic hernia without obstruction or gangrene: Secondary | ICD-10-CM | POA: Insufficient documentation

## 2015-02-09 DIAGNOSIS — K5792 Diverticulitis of intestine, part unspecified, without perforation or abscess without bleeding: Secondary | ICD-10-CM | POA: Diagnosis not present

## 2015-02-09 DIAGNOSIS — R918 Other nonspecific abnormal finding of lung field: Secondary | ICD-10-CM | POA: Insufficient documentation

## 2015-02-09 DIAGNOSIS — H3530 Unspecified macular degeneration: Secondary | ICD-10-CM | POA: Insufficient documentation

## 2015-02-09 DIAGNOSIS — M549 Dorsalgia, unspecified: Secondary | ICD-10-CM | POA: Insufficient documentation

## 2015-02-09 DIAGNOSIS — K589 Irritable bowel syndrome without diarrhea: Secondary | ICD-10-CM | POA: Insufficient documentation

## 2015-02-09 DIAGNOSIS — T402X5A Adverse effect of other opioids, initial encounter: Secondary | ICD-10-CM | POA: Insufficient documentation

## 2015-02-09 DIAGNOSIS — J40 Bronchitis, not specified as acute or chronic: Secondary | ICD-10-CM

## 2015-02-09 DIAGNOSIS — K5730 Diverticulosis of large intestine without perforation or abscess without bleeding: Secondary | ICD-10-CM | POA: Insufficient documentation

## 2015-02-09 DIAGNOSIS — K5903 Drug induced constipation: Secondary | ICD-10-CM | POA: Insufficient documentation

## 2015-02-09 DIAGNOSIS — K2100 Gastro-esophageal reflux disease with esophagitis, without bleeding: Secondary | ICD-10-CM | POA: Insufficient documentation

## 2015-02-09 DIAGNOSIS — M4802 Spinal stenosis, cervical region: Secondary | ICD-10-CM | POA: Diagnosis not present

## 2015-02-09 DIAGNOSIS — R05 Cough: Secondary | ICD-10-CM | POA: Diagnosis not present

## 2015-02-09 DIAGNOSIS — N8110 Cystocele, unspecified: Secondary | ICD-10-CM | POA: Diagnosis not present

## 2015-02-09 DIAGNOSIS — R0602 Shortness of breath: Secondary | ICD-10-CM | POA: Diagnosis not present

## 2015-02-09 DIAGNOSIS — I341 Nonrheumatic mitral (valve) prolapse: Secondary | ICD-10-CM | POA: Insufficient documentation

## 2015-02-09 DIAGNOSIS — K5900 Constipation, unspecified: Secondary | ICD-10-CM | POA: Insufficient documentation

## 2015-02-09 DIAGNOSIS — I248 Other forms of acute ischemic heart disease: Secondary | ICD-10-CM | POA: Diagnosis not present

## 2015-02-09 DIAGNOSIS — N390 Urinary tract infection, site not specified: Secondary | ICD-10-CM | POA: Diagnosis not present

## 2015-02-09 DIAGNOSIS — G4700 Insomnia, unspecified: Secondary | ICD-10-CM | POA: Insufficient documentation

## 2015-02-09 DIAGNOSIS — K6389 Other specified diseases of intestine: Secondary | ICD-10-CM | POA: Diagnosis not present

## 2015-02-09 DIAGNOSIS — J189 Pneumonia, unspecified organism: Secondary | ICD-10-CM | POA: Diagnosis not present

## 2015-02-09 DIAGNOSIS — J309 Allergic rhinitis, unspecified: Secondary | ICD-10-CM | POA: Insufficient documentation

## 2015-02-09 DIAGNOSIS — J449 Chronic obstructive pulmonary disease, unspecified: Secondary | ICD-10-CM | POA: Diagnosis not present

## 2015-02-09 DIAGNOSIS — J690 Pneumonitis due to inhalation of food and vomit: Secondary | ICD-10-CM | POA: Diagnosis not present

## 2015-02-09 DIAGNOSIS — E039 Hypothyroidism, unspecified: Secondary | ICD-10-CM | POA: Insufficient documentation

## 2015-02-09 DIAGNOSIS — I214 Non-ST elevation (NSTEMI) myocardial infarction: Secondary | ICD-10-CM | POA: Diagnosis not present

## 2015-02-09 DIAGNOSIS — K56699 Other intestinal obstruction unspecified as to partial versus complete obstruction: Secondary | ICD-10-CM | POA: Insufficient documentation

## 2015-02-09 DIAGNOSIS — M4800 Spinal stenosis, site unspecified: Secondary | ICD-10-CM | POA: Insufficient documentation

## 2015-02-09 DIAGNOSIS — I739 Peripheral vascular disease, unspecified: Secondary | ICD-10-CM | POA: Insufficient documentation

## 2015-02-09 DIAGNOSIS — J45909 Unspecified asthma, uncomplicated: Secondary | ICD-10-CM | POA: Insufficient documentation

## 2015-02-09 DIAGNOSIS — R0902 Hypoxemia: Secondary | ICD-10-CM | POA: Diagnosis not present

## 2015-02-09 DIAGNOSIS — Z951 Presence of aortocoronary bypass graft: Secondary | ICD-10-CM | POA: Insufficient documentation

## 2015-02-09 DIAGNOSIS — I2510 Atherosclerotic heart disease of native coronary artery without angina pectoris: Secondary | ICD-10-CM | POA: Insufficient documentation

## 2015-02-09 DIAGNOSIS — R531 Weakness: Secondary | ICD-10-CM | POA: Diagnosis not present

## 2015-02-09 DIAGNOSIS — E7800 Pure hypercholesterolemia, unspecified: Secondary | ICD-10-CM | POA: Insufficient documentation

## 2015-02-09 DIAGNOSIS — R609 Edema, unspecified: Secondary | ICD-10-CM | POA: Insufficient documentation

## 2015-02-09 DIAGNOSIS — M810 Age-related osteoporosis without current pathological fracture: Secondary | ICD-10-CM | POA: Insufficient documentation

## 2015-02-09 MED ORDER — LEVOFLOXACIN 500 MG PO TABS: 500.0000 mg | ORAL_TABLET | Freq: Every day | ORAL | Status: DC

## 2015-02-09 MED ORDER — METRONIDAZOLE 500 MG PO TABS: 500.0000 mg | ORAL_TABLET | Freq: Three times a day (TID) | ORAL | Status: DC

## 2015-02-09 NOTE — Progress Notes (Signed)
Subjective:    Patient ID: Chelsea [name], female    DOB: [birth year], 81 y.o.   MRN: [id number]  URI  This is a new problem. The current episode started in the past 7 days. The maximum temperature recorded prior to her arrival was 100.4 - 100.9 F. The fever has been present for 1 to 2 days. Associated symptoms include abdominal pain (constipation), congestion, coughing, headaches, nausea, a rash (started at onset of URI sx), rhinorrhea, a sore throat and vomiting. Pertinent negatives include no chest pain, dysuria, ear pain, joint pain, joint swelling, neck pain, plugged ear sensation, sinus pain, sneezing, swollen glands or wheezing.  Constipation The current episode started in the past 7 days. The problem is unchanged. Associated symptoms include abdominal pain (constipation), anorexia, back pain, bloating, difficulty urinating, a fever, nausea and vomiting. Pertinent negatives include no flatus, hematochezia or melena. Treatments tried: Miralax BID. The treatment provided moderate relief.   Has had trouble with bowels before. Miralax did work some. Now has had loose stools since.  Did not want to go to ER.    Review of Systems  Constitutional: Positive for fever.  HENT: Positive for congestion, rhinorrhea and sore throat. Negative for ear pain and sneezing.   Respiratory: Positive for cough. Negative for wheezing.   Cardiovascular: Negative for chest pain.  Gastrointestinal: Positive for nausea, vomiting, abdominal pain (constipation), constipation, bloating and anorexia. Negative for melena, hematochezia and flatus.  Genitourinary: Positive for difficulty urinating. Negative for dysuria.  Musculoskeletal: Positive for back pain. Negative for joint pain and neck pain.  Skin: Positive for rash (started at onset of URI sx).  Neurological: Positive for headaches.   BP 118/52 mmHg  Pulse 78  Temp(Src) 99.6 F (37.6 C) (Oral)  Resp 24  SpO2 90%   Patient Active Problem List    Diagnosis Date Noted  . COPD exacerbation 01/15/2015  . COPD (chronic obstructive pulmonary disease) 01/15/2015  . GERD (gastroesophageal reflux disease) 12/23/2014  . Spinal stenosis at L4-L5 level 12/16/2014  . Lumbar nerve root compression 12/16/2014  . Compression fracture of lumbar vertebra 12/16/2014  . Lumbar canal stenosis 11/04/2013  . Neuritis or radiculitis due to rupture of lumbar intervertebral disc 11/04/2013  . DDD (degenerative disc disease), lumbar 11/04/2013  . Dermatophytosis of nail 03/31/2013  . Pain in limb 03/31/2013  . Preoperative cardiovascular examination 08/05/2012  . HTN (hypertension) 07/24/2011  . ABDOMINAL PAIN-GENERALIZED 07/21/2010  . Hyperlipidemia 01/31/2010  . DIZZINESS 01/31/2010  . Carotid artery stenosis 12/21/2009  . CORONARY ATHEROSLERO AUTOL VEIN BYPASS GRAFT 05/25/2009  . Dyspnea on exertion 05/25/2009   Past Medical History  Diagnosis Date  . Coronary artery disease     s/p CABG  . Hyperlipidemia   . Hypertension   . Bradycardia     hx of it and fatigue with beta blockade  . History of hyperkalemia   . CVD (cerebrovascular disease)     s/p carotid endarterectomy  . Stroke 1980s    left brain  . Mitral valve prolapse   . Osteoporosis   . Asthma   . Degenerative disc disease   . Spinal stenosis in cervical region   . Norovirus 2014   Current Outpatient Prescriptions on File Prior to Visit  Medication Sig  . albuterol (PROVENTIL HFA;VENTOLIN HFA) 108 (90 BASE) MCG/ACT inhaler Inhale 2 puffs into the lungs every 6 (six) hours as needed.  Marland Kitchen alendronate (FOSAMAX) 70 MG tablet Take 70 mg by mouth once a week. Take with a  full glass of water on an empty stomach.  Marland Kitchen amLODipine (NORVASC) 10 MG tablet Take 10 mg by mouth daily.  Marland Kitchen aspirin 81 MG tablet Take 81 mg by mouth daily.  Marland Kitchen atorvastatin (LIPITOR) 80 MG tablet Take 1 tablet (80 mg total) by mouth daily.  . beclomethasone (BECONASE-AQ) 42 MCG/SPRAY nasal spray Place into both  nostrils 2 (two) times daily. Dose is for each nostril.  Marland Kitchen BREO ELLIPTA 100-25 MCG/INH AEPB   . diazepam (VALIUM) 5 MG tablet Take 5 mg by mouth every 6 (six) hours as needed.    . fluticasone (FLONASE) 50 MCG/ACT nasal spray Place 2 sprays into both nostrils daily.  . lansoprazole (PREVACID) 30 MG capsule TAKE 1 CAPSULE TWICE DAILY  . levothyroxine (SYNTHROID, LEVOTHROID) 88 MCG tablet 1 tablet daily.  Marland Kitchen LORazepam (ATIVAN) 0.5 MG tablet Take 0.5 mg by mouth as needed.   . metaxalone (SKELAXIN) 800 MG tablet Take 800 mg by mouth as needed for muscle spasms.  . montelukast (SINGULAIR) 10 MG tablet Take 10 mg by mouth at bedtime.  . Multiple Minerals-Vitamins (CALCIUM CITRATE +) TABS Take 1 tablet by mouth daily.    . Multiple Vitamins-Minerals (PRESERVISION AREDS PO) Take by mouth.  . nitroGLYCERIN (NITROSTAT) 0.4 MG SL tablet Place 1 tablet (0.4 mg total) under the tongue every 5 (five) minutes as needed.  Marland Kitchen oxybutynin (DITROPAN) 5 MG tablet take 1 tablet by mouth at bedtime  . oxyCODONE-acetaminophen (PERCOCET) 10-325 MG per tablet Take 1 tablet by mouth every 6 (six) hours as needed for pain.  . predniSONE (DELTASONE) 10 MG tablet 6 po for 1 day and then 5 po for 1 day and then 4 po for 1 day and 3 po for 1 day and then 2 po for 1 day and then 1 po for 1 day.  Marland Kitchen RA SENNA 8.6 MG tablet Take 1 tablet by mouth 2 (two) times daily.  . sertraline (ZOLOFT) 100 MG tablet Take 1.5 tablets (150 mg total) by mouth daily.   No current facility-administered medications on file prior to visit.   Allergies  Allergen Reactions  . Sulfonamide Derivatives    Past Surgical History  Procedure Laterality Date  . Coronary artery bypass graft    . Carotid endarterectomy      left  . Cholecystectomy    . Abdominal hysterectomy    . Bladder suspension    . Hip surgery  02/2011    right   Social History   Social History  . Marital Status: Widowed    Spouse Name: N/A  . Number of Children: N/A  .  Years of Education: N/A   Occupational History  . Not on file.   Social History Main Topics  . Smoking status: Former Research scientist (life sciences)  . Smokeless tobacco: Never Used     Comment: quit 50 years ago  . Alcohol Use: No  . Drug Use: No  . Sexual Activity: Not on file   Other Topics Concern  . Not on file   Social History Narrative   Family History  Problem Relation Age of Onset  . Throat cancer Mother   . Stroke Father       Objective:   Physical Exam  Constitutional: She is oriented to person, place, and time. She appears well-developed and well-nourished.  Cardiovascular: Normal rate and regular rhythm.   Pulmonary/Chest: Effort normal. She has rales.  Abdominal: Soft. She exhibits distension. There is tenderness (mild diffuse tenderness. Does have faint scattered bowel sounds. ).  There is no rebound and no guarding.  Neurological: She is alert and oriented to person, place, and time.   BP 118/52 mmHg  Pulse 78  Temp(Src) 99.6 F (37.6 C) (Oral)  Resp 24  SpO2 90%      Assessment & Plan:  1. Diverticulitis of intestine without perforation or abscess without bleeding Will check X-ray to evaluate for obstruction. Has had diverticulitis in the past. Did not notice abdominal pain until palpation. Has had fever. Will treat for possible diverticulitis and further plan pending these results.  - DG Abd 2 Views; Future - metroNIDAZOLE (FLAGYL) 500 MG tablet; Take 1 tablet (500 mg total) by mouth 3 (three) times daily.  Dispense: 21 tablet; Refill: 0  2. Bronchitis Will check labs, check CXR to rule out pneumonia and treat.  Patient informed to go to ER if any symptoms worsen. Is very reluctant to go, would like to try home management first. Does live in facility where can call for assistance emergently if needed.  - CBC with Differential/Platelet - Comprehensive metabolic panel - DG Chest 2 View; Future - levofloxacin (LEVAQUIN) 500 MG tablet; Take 1 tablet (500 mg total) by mouth  daily.  Dispense: 7 tablet; Refill: 0  .Margarita Rana, MD

## 2015-02-09 NOTE — Telephone Encounter (Signed)
Pt called concern regarding constipation and nausea/vomiting;  pt thinks that she is impacted, she stated that she has been taking myralax twice a day, which the health nurse had told her to do and that she may need to go the the hospital if it did not work; but she feels like " it goes around", leaking brown fecal with no blood noted in the stool" she stated that she took some prune juice which she vomited last night.  Transferred pt to upfront to schedule her an appointment.  Thanks,

## 2015-02-10 ENCOUNTER — Emergency Department: Payer: Medicare Other

## 2015-02-10 ENCOUNTER — Ambulatory Visit: Payer: Medicare Other | Admitting: Family Medicine

## 2015-02-10 ENCOUNTER — Telehealth: Payer: Self-pay

## 2015-02-10 ENCOUNTER — Inpatient Hospital Stay
Admission: EM | Admit: 2015-02-10 | Discharge: 2015-02-18 | DRG: 178 | Disposition: A | Discharge status: Skilled Nursing Facility | Payer: Medicare Other | Attending: Internal Medicine | Admitting: Internal Medicine

## 2015-02-10 ENCOUNTER — Encounter: Payer: Self-pay | Admitting: Emergency Medicine

## 2015-02-10 DIAGNOSIS — I10 Essential (primary) hypertension: Secondary | ICD-10-CM | POA: Diagnosis present

## 2015-02-10 DIAGNOSIS — Z823 Family history of stroke: Secondary | ICD-10-CM | POA: Diagnosis not present

## 2015-02-10 DIAGNOSIS — K5900 Constipation, unspecified: Secondary | ICD-10-CM | POA: Diagnosis present

## 2015-02-10 DIAGNOSIS — I2510 Atherosclerotic heart disease of native coronary artery without angina pectoris: Secondary | ICD-10-CM | POA: Diagnosis present

## 2015-02-10 DIAGNOSIS — T17908A Unspecified foreign body in respiratory tract, part unspecified causing other injury, initial encounter: Secondary | ICD-10-CM

## 2015-02-10 DIAGNOSIS — D638 Anemia in other chronic diseases classified elsewhere: Secondary | ICD-10-CM | POA: Diagnosis present

## 2015-02-10 DIAGNOSIS — K449 Diaphragmatic hernia without obstruction or gangrene: Secondary | ICD-10-CM | POA: Diagnosis present

## 2015-02-10 DIAGNOSIS — M5136 Other intervertebral disc degeneration, lumbar region: Secondary | ICD-10-CM | POA: Diagnosis present

## 2015-02-10 DIAGNOSIS — R531 Weakness: Secondary | ICD-10-CM

## 2015-02-10 DIAGNOSIS — J9811 Atelectasis: Secondary | ICD-10-CM | POA: Diagnosis not present

## 2015-02-10 DIAGNOSIS — M6281 Muscle weakness (generalized): Secondary | ICD-10-CM | POA: Diagnosis not present

## 2015-02-10 DIAGNOSIS — K219 Gastro-esophageal reflux disease without esophagitis: Secondary | ICD-10-CM | POA: Diagnosis present

## 2015-02-10 DIAGNOSIS — J45909 Unspecified asthma, uncomplicated: Secondary | ICD-10-CM | POA: Diagnosis present

## 2015-02-10 DIAGNOSIS — M4856XD Collapsed vertebra, not elsewhere classified, lumbar region, subsequent encounter for fracture with routine healing: Secondary | ICD-10-CM | POA: Diagnosis present

## 2015-02-10 DIAGNOSIS — Z87891 Personal history of nicotine dependence: Secondary | ICD-10-CM | POA: Diagnosis not present

## 2015-02-10 DIAGNOSIS — R0902 Hypoxemia: Secondary | ICD-10-CM | POA: Diagnosis not present

## 2015-02-10 DIAGNOSIS — J449 Chronic obstructive pulmonary disease, unspecified: Secondary | ICD-10-CM | POA: Diagnosis present

## 2015-02-10 DIAGNOSIS — R778 Other specified abnormalities of plasma proteins: Secondary | ICD-10-CM | POA: Diagnosis present

## 2015-02-10 DIAGNOSIS — R7989 Other specified abnormal findings of blood chemistry: Secondary | ICD-10-CM

## 2015-02-10 DIAGNOSIS — I4510 Unspecified right bundle-branch block: Secondary | ICD-10-CM | POA: Diagnosis present

## 2015-02-10 DIAGNOSIS — J984 Other disorders of lung: Secondary | ICD-10-CM | POA: Diagnosis not present

## 2015-02-10 DIAGNOSIS — Z9981 Dependence on supplemental oxygen: Secondary | ICD-10-CM | POA: Diagnosis not present

## 2015-02-10 DIAGNOSIS — R110 Nausea: Secondary | ICD-10-CM | POA: Diagnosis not present

## 2015-02-10 DIAGNOSIS — Z7982 Long term (current) use of aspirin: Secondary | ICD-10-CM | POA: Diagnosis not present

## 2015-02-10 DIAGNOSIS — K6389 Other specified diseases of intestine: Secondary | ICD-10-CM | POA: Diagnosis not present

## 2015-02-10 DIAGNOSIS — G8929 Other chronic pain: Secondary | ICD-10-CM | POA: Diagnosis present

## 2015-02-10 DIAGNOSIS — R0602 Shortness of breath: Secondary | ICD-10-CM | POA: Diagnosis not present

## 2015-02-10 DIAGNOSIS — J189 Pneumonia, unspecified organism: Secondary | ICD-10-CM | POA: Diagnosis not present

## 2015-02-10 DIAGNOSIS — I214 Non-ST elevation (NSTEMI) myocardial infarction: Secondary | ICD-10-CM | POA: Diagnosis not present

## 2015-02-10 DIAGNOSIS — N390 Urinary tract infection, site not specified: Secondary | ICD-10-CM | POA: Diagnosis present

## 2015-02-10 DIAGNOSIS — M4802 Spinal stenosis, cervical region: Secondary | ICD-10-CM | POA: Diagnosis present

## 2015-02-10 DIAGNOSIS — Z8673 Personal history of transient ischemic attack (TIA), and cerebral infarction without residual deficits: Secondary | ICD-10-CM

## 2015-02-10 DIAGNOSIS — E785 Hyperlipidemia, unspecified: Secondary | ICD-10-CM | POA: Diagnosis present

## 2015-02-10 DIAGNOSIS — M810 Age-related osteoporosis without current pathological fracture: Secondary | ICD-10-CM | POA: Diagnosis present

## 2015-02-10 DIAGNOSIS — I248 Other forms of acute ischemic heart disease: Secondary | ICD-10-CM | POA: Diagnosis present

## 2015-02-10 DIAGNOSIS — N289 Disorder of kidney and ureter, unspecified: Secondary | ICD-10-CM | POA: Diagnosis present

## 2015-02-10 DIAGNOSIS — J690 Pneumonitis due to inhalation of food and vomit: Secondary | ICD-10-CM | POA: Diagnosis present

## 2015-02-10 DIAGNOSIS — N8110 Cystocele, unspecified: Secondary | ICD-10-CM | POA: Diagnosis not present

## 2015-02-10 DIAGNOSIS — Z951 Presence of aortocoronary bypass graft: Secondary | ICD-10-CM

## 2015-02-10 DIAGNOSIS — R0600 Dyspnea, unspecified: Secondary | ICD-10-CM | POA: Diagnosis not present

## 2015-02-10 DIAGNOSIS — J069 Acute upper respiratory infection, unspecified: Secondary | ICD-10-CM | POA: Diagnosis present

## 2015-02-10 DIAGNOSIS — R10817 Generalized abdominal tenderness: Secondary | ICD-10-CM | POA: Diagnosis not present

## 2015-02-10 DIAGNOSIS — R197 Diarrhea, unspecified: Secondary | ICD-10-CM | POA: Diagnosis not present

## 2015-02-10 DIAGNOSIS — M5116 Intervertebral disc disorders with radiculopathy, lumbar region: Secondary | ICD-10-CM

## 2015-02-10 DIAGNOSIS — R918 Other nonspecific abnormal finding of lung field: Secondary | ICD-10-CM | POA: Diagnosis not present

## 2015-02-10 DIAGNOSIS — N3000 Acute cystitis without hematuria: Secondary | ICD-10-CM | POA: Diagnosis not present

## 2015-02-10 DIAGNOSIS — R269 Unspecified abnormalities of gait and mobility: Secondary | ICD-10-CM | POA: Diagnosis not present

## 2015-02-10 HISTORY — DX: Peripheral vascular disease, unspecified: I73.9

## 2015-02-10 HISTORY — DX: Disorder of arteries and arterioles, unspecified: I77.9

## 2015-02-10 LAB — COMPREHENSIVE METABOLIC PANEL
ALBUMIN: 3.1 g/dL — AB (ref 3.5–5.0)
ALK PHOS: 98 IU/L (ref 39–117)
ALT: 14 IU/L (ref 0–32)
ALT: 19 U/L (ref 14–54)
AST: 22 IU/L (ref 0–40)
AST: 41 U/L (ref 15–41)
Albumin/Globulin Ratio: 1.3 (ref 1.1–2.5)
Albumin: 3.7 g/dL (ref 3.5–4.7)
Alkaline Phosphatase: 84 U/L (ref 38–126)
Anion gap: 11 (ref 5–15)
BILIRUBIN TOTAL: 0.6 mg/dL (ref 0.0–1.2)
BUN / CREAT RATIO: 21 (ref 11–26)
BUN: 29 mg/dL — AB (ref 8–27)
BUN: 27 mg/dL — AB (ref 6–20)
CHLORIDE: 97 mmol/L (ref 97–108)
CHLORIDE: 99 mmol/L — AB (ref 101–111)
CO2: 22 mmol/L (ref 18–29)
CO2: 24 mmol/L (ref 22–32)
Calcium: 9.5 mg/dL (ref 8.7–10.3)
Calcium: 8.4 mg/dL — ABNORMAL LOW (ref 8.9–10.3)
Creatinine, Ser: 1.38 mg/dL — ABNORMAL HIGH (ref 0.57–1.00)
Creatinine, Ser: 1.33 mg/dL — ABNORMAL HIGH (ref 0.44–1.00)
GFR calc Af Amer: 39 mL/min/{1.73_m2} — ABNORMAL LOW (ref >59)
GFR calc Af Amer: 40 mL/min — ABNORMAL LOW (ref >60)
GFR calc non Af Amer: 34 mL/min/{1.73_m2} — ABNORMAL LOW (ref >59)
GFR calc non Af Amer: 34 mL/min — ABNORMAL LOW (ref >60)
GLUCOSE: 126 mg/dL — AB (ref 65–99)
GLUCOSE: 107 mg/dL — AB (ref 65–99)
Globulin, Total: 2.9 g/dL (ref 1.5–4.5)
POTASSIUM: 4.5 mmol/L (ref 3.5–5.1)
Potassium: 7.9 mmol/L (ref 3.5–5.2)
Sodium: 140 mmol/L (ref 134–144)
Sodium: 134 mmol/L — ABNORMAL LOW (ref 135–145)
Total Bilirubin: 0.6 mg/dL (ref 0.3–1.2)
Total Protein: 6.6 g/dL (ref 6.0–8.5)
Total Protein: 7.1 g/dL (ref 6.5–8.1)

## 2015-02-10 LAB — CBC WITH DIFFERENTIAL/PLATELET
BASOS ABS: 0 10*3/uL (ref 0.0–0.2)
Basos: 0 %
EOS (ABSOLUTE): 0.1 10*3/uL (ref 0.0–0.4)
Eos: 1 %
Hematocrit: 36.8 % (ref 34.0–46.6)
Hemoglobin: 11.4 g/dL (ref 11.1–15.9)
Immature Grans (Abs): 0 10*3/uL (ref 0.0–0.1)
Immature Granulocytes: 0 %
LYMPHS ABS: 2.1 10*3/uL (ref 0.7–3.1)
Lymphs: 19 %
MCH: 28 pg (ref 26.6–33.0)
MCHC: 31 g/dL — AB (ref 31.5–35.7)
MCV: 90 fL (ref 79–97)
Monocytes Absolute: 1.2 10*3/uL — ABNORMAL HIGH (ref 0.1–0.9)
Monocytes: 11 %
NEUTROS ABS: 7.7 10*3/uL — AB (ref 1.4–7.0)
Neutrophils: 69 %
PLATELETS: 377 10*3/uL (ref 150–379)
RBC: 4.07 x10E6/uL (ref 3.77–5.28)
RDW: 17 % — AB (ref 12.3–15.4)
WBC: 11.1 10*3/uL — AB (ref 3.4–10.8)

## 2015-02-10 LAB — CBC
HEMATOCRIT: 33.2 % — AB (ref 35.0–47.0)
Hemoglobin: 11 g/dL — ABNORMAL LOW (ref 12.0–16.0)
MCH: 28.7 pg (ref 26.0–34.0)
MCHC: 33.1 g/dL (ref 32.0–36.0)
MCV: 86.7 fL (ref 80.0–100.0)
Platelets: 331 10*3/uL (ref 150–440)
RBC: 3.82 MIL/uL (ref 3.80–5.20)
RDW: 18.2 % — AB (ref 11.5–14.5)
WBC: 10.5 10*3/uL (ref 3.6–11.0)

## 2015-02-10 LAB — URINALYSIS COMPLETE WITH MICROSCOPIC (ARMC ONLY)
BACTERIA UA: NONE SEEN
Bilirubin Urine: NEGATIVE
GLUCOSE, UA: NEGATIVE mg/dL
HGB URINE DIPSTICK: NEGATIVE
Ketones, ur: NEGATIVE mg/dL
Nitrite: NEGATIVE
Protein, ur: NEGATIVE mg/dL
RBC / HPF: NONE SEEN RBC/hpf (ref 0–5)
Specific Gravity, Urine: 1.01 (ref 1.005–1.030)
pH: 7 (ref 5.0–8.0)

## 2015-02-10 LAB — PROTIME-INR
INR: 1.05
Prothrombin Time: 13.9 seconds (ref 11.4–15.0)

## 2015-02-10 LAB — TROPONIN I: TROPONIN I: 0.16 ng/mL — AB (ref <0.031)

## 2015-02-10 LAB — APTT: aPTT: 29 seconds (ref 24–36)

## 2015-02-10 LAB — CK: CK TOTAL: 176 U/L (ref 38–234)

## 2015-02-10 MED ORDER — LEVOFLOXACIN IN D5W 750 MG/150ML IV SOLN
750.0000 mg | Freq: Once | INTRAVENOUS | Status: AC
  Administered 2015-02-10: 750 mg via INTRAVENOUS
  Filled 2015-02-10: qty 150

## 2015-02-10 MED ORDER — ENOXAPARIN SODIUM 30 MG/0.3ML ~~LOC~~ SOLN
30.0000 mg | SUBCUTANEOUS | Status: DC
  Administered 2015-02-10: 30 mg via SUBCUTANEOUS
  Filled 2015-02-10: qty 0.3

## 2015-02-10 MED ORDER — METAXALONE 800 MG PO TABS: 800.0000 mg | ORAL_TABLET | Freq: Three times a day (TID) | ORAL | Status: DC | PRN

## 2015-02-10 MED ORDER — CALCIUM CARBONATE-VITAMIN D 500-200 MG-UNIT PO TABS
1.0000 | ORAL_TABLET | Freq: Every day | ORAL | Status: DC
  Administered 2015-02-10 – 2015-02-18 (×8): 1 via ORAL
  Filled 2015-02-10 (×9): qty 1

## 2015-02-10 MED ORDER — SODIUM CHLORIDE 0.9 % IV SOLN
INTRAVENOUS | Status: DC
  Administered 2015-02-10 – 2015-02-13 (×4): via INTRAVENOUS

## 2015-02-10 MED ORDER — DEXTROSE 5 % IV SOLN
1.0000 g | INTRAVENOUS | Status: DC
  Administered 2015-02-10 – 2015-02-13 (×4): 1 g via INTRAVENOUS
  Filled 2015-02-10 (×5): qty 10

## 2015-02-10 MED ORDER — OXYCODONE-ACETAMINOPHEN 5-325 MG PO TABS: 1.0000 | ORAL_TABLET | Freq: Four times a day (QID) | ORAL | Status: DC | PRN

## 2015-02-10 MED ORDER — OXYBUTYNIN CHLORIDE 5 MG PO TABS
5.0000 mg | ORAL_TABLET | Freq: Every day | ORAL | Status: DC
  Administered 2015-02-10 – 2015-02-17 (×8): 5 mg via ORAL
  Filled 2015-02-10 (×8): qty 1

## 2015-02-10 MED ORDER — BISACODYL 5 MG PO TBEC: 5.0000 mg | DELAYED_RELEASE_TABLET | Freq: Every day | ORAL | Status: DC | PRN

## 2015-02-10 MED ORDER — ATORVASTATIN CALCIUM 20 MG PO TABS
80.0000 mg | ORAL_TABLET | Freq: Every day | ORAL | Status: DC
  Administered 2015-02-10 – 2015-02-18 (×9): 80 mg via ORAL
  Filled 2015-02-10 (×9): qty 4

## 2015-02-10 MED ORDER — SENNA 8.6 MG PO TABS: 1.0000 | ORAL_TABLET | Freq: Every day | ORAL | Status: DC | PRN

## 2015-02-10 MED ORDER — ONDANSETRON HCL 4 MG PO TABS: 4.0000 mg | ORAL_TABLET | Freq: Four times a day (QID) | ORAL | Status: DC | PRN

## 2015-02-10 MED ORDER — CALCIUM CITRATE-VITAMIN D 315-200 MG-UNIT PO TABS: 1.0000 | ORAL_TABLET | Freq: Every day | ORAL | Status: DC

## 2015-02-10 MED ORDER — IOHEXOL 300 MG/ML  SOLN
75.0000 mL | Freq: Once | INTRAMUSCULAR | Status: AC | PRN
  Administered 2015-02-10: 75 mL via INTRAVENOUS

## 2015-02-10 MED ORDER — DEXTROSE 5 % IV SOLN
500.0000 mg | INTRAVENOUS | Status: DC
  Administered 2015-02-11 – 2015-02-13 (×3): 500 mg via INTRAVENOUS
  Filled 2015-02-10 (×4): qty 500

## 2015-02-10 MED ORDER — NITROGLYCERIN 0.4 MG SL SUBL: 0.4000 mg | SUBLINGUAL_TABLET | SUBLINGUAL | Status: DC | PRN

## 2015-02-10 MED ORDER — DIAZEPAM 5 MG PO TABS
5.0000 mg | ORAL_TABLET | Freq: Four times a day (QID) | ORAL | Status: DC | PRN
  Administered 2015-02-12: 5 mg via ORAL
  Filled 2015-02-10: qty 1

## 2015-02-10 MED ORDER — OXYCODONE HCL 5 MG PO TABS
5.0000 mg | ORAL_TABLET | Freq: Four times a day (QID) | ORAL | Status: DC | PRN
  Administered 2015-02-13: 5 mg via ORAL
  Filled 2015-02-10: qty 1

## 2015-02-10 MED ORDER — AMLODIPINE BESYLATE 10 MG PO TABS
10.0000 mg | ORAL_TABLET | Freq: Every day | ORAL | Status: DC
  Administered 2015-02-10 – 2015-02-18 (×8): 10 mg via ORAL
  Filled 2015-02-10 (×9): qty 1

## 2015-02-10 MED ORDER — ALBUTEROL SULFATE (2.5 MG/3ML) 0.083% IN NEBU: 5.0000 mg | INHALATION_SOLUTION | Freq: Once | RESPIRATORY_TRACT | Status: DC

## 2015-02-10 MED ORDER — ALBUTEROL SULFATE (2.5 MG/3ML) 0.083% IN NEBU: 3.0000 mL | INHALATION_SOLUTION | Freq: Four times a day (QID) | RESPIRATORY_TRACT | Status: DC | PRN

## 2015-02-10 MED ORDER — MONTELUKAST SODIUM 10 MG PO TABS
10.0000 mg | ORAL_TABLET | Freq: Every day | ORAL | Status: DC
  Administered 2015-02-10 – 2015-02-17 (×8): 10 mg via ORAL
  Filled 2015-02-10 (×9): qty 1

## 2015-02-10 MED ORDER — OCUVITE-LUTEIN PO CAPS
1.0000 | ORAL_CAPSULE | Freq: Every day | ORAL | Status: DC
Start: 2015-02-10 — End: 2015-02-18
  Administered 2015-02-10 – 2015-02-18 (×8): 1 via ORAL
  Filled 2015-02-10 (×9): qty 1

## 2015-02-10 MED ORDER — ASPIRIN EC 81 MG PO TBEC
81.0000 mg | DELAYED_RELEASE_TABLET | Freq: Every day | ORAL | Status: DC
  Administered 2015-02-10 – 2015-02-18 (×9): 81 mg via ORAL
  Filled 2015-02-10 (×9): qty 1

## 2015-02-10 MED ORDER — ASPIRIN 81 MG PO CHEW
324.0000 mg | CHEWABLE_TABLET | Freq: Once | ORAL | Status: AC
Start: 2015-02-10 — End: 2015-02-10
  Administered 2015-02-10: 324 mg via ORAL
  Filled 2015-02-10: qty 4

## 2015-02-10 MED ORDER — DOCUSATE SODIUM 100 MG PO CAPS: 100.0000 mg | ORAL_CAPSULE | Freq: Two times a day (BID) | ORAL | Status: DC | PRN

## 2015-02-10 MED ORDER — ALENDRONATE SODIUM 70 MG PO TABS: 70.0000 mg | ORAL_TABLET | ORAL | Status: DC

## 2015-02-10 MED ORDER — PANTOPRAZOLE SODIUM 40 MG PO TBEC
40.0000 mg | DELAYED_RELEASE_TABLET | Freq: Two times a day (BID) | ORAL | Status: DC
  Administered 2015-02-10 – 2015-02-18 (×16): 40 mg via ORAL
  Filled 2015-02-10 (×15): qty 1

## 2015-02-10 MED ORDER — SERTRALINE HCL 50 MG PO TABS
150.0000 mg | ORAL_TABLET | Freq: Every day | ORAL | Status: DC
  Administered 2015-02-10 – 2015-02-18 (×8): 150 mg via ORAL
  Filled 2015-02-10 (×9): qty 3

## 2015-02-10 MED ORDER — IOHEXOL 240 MG/ML SOLN
25.0000 mL | Freq: Once | INTRAMUSCULAR | Status: AC | PRN
  Administered 2015-02-10: 25 mL via ORAL

## 2015-02-10 MED ORDER — LEVOTHYROXINE SODIUM 88 MCG PO TABS
88.0000 ug | ORAL_TABLET | Freq: Every day | ORAL | Status: DC
  Administered 2015-02-11 – 2015-02-18 (×7): 88 ug via ORAL
  Filled 2015-02-10 (×8): qty 1

## 2015-02-10 MED ORDER — LORAZEPAM 0.5 MG PO TABS: 0.5000 mg | ORAL_TABLET | Freq: Every day | ORAL | Status: DC | PRN

## 2015-02-10 MED ORDER — ACETAMINOPHEN 325 MG PO TABS: 650.0000 mg | ORAL_TABLET | Freq: Four times a day (QID) | ORAL | Status: DC | PRN

## 2015-02-10 MED ORDER — ALBUTEROL SULFATE (2.5 MG/3ML) 0.083% IN NEBU: 2.5000 mg | INHALATION_SOLUTION | RESPIRATORY_TRACT | Status: DC | PRN

## 2015-02-10 MED ORDER — OXYCODONE-ACETAMINOPHEN 10-325 MG PO TABS: 1.0000 | ORAL_TABLET | Freq: Four times a day (QID) | ORAL | Status: DC | PRN

## 2015-02-10 MED ORDER — SODIUM CHLORIDE 0.9 % IV BOLUS (SEPSIS)
500.0000 mL | Freq: Once | INTRAVENOUS | Status: AC
  Administered 2015-02-10: 500 mL via INTRAVENOUS

## 2015-02-10 MED ORDER — FLUTICASONE PROPIONATE 50 MCG/ACT NA SUSP
2.0000 | Freq: Every day | NASAL | Status: DC
  Administered 2015-02-10 – 2015-02-18 (×8): 2 via NASAL
  Filled 2015-02-10: qty 16

## 2015-02-10 MED ORDER — ONDANSETRON HCL 4 MG/2ML IJ SOLN
4.0000 mg | Freq: Four times a day (QID) | INTRAMUSCULAR | Status: DC | PRN
  Administered 2015-02-11 – 2015-02-15 (×2): 4 mg via INTRAVENOUS
  Filled 2015-02-10 (×2): qty 2

## 2015-02-10 MED ORDER — FLUTICASONE FUROATE-VILANTEROL 100-25 MCG/INH IN AEPB
1.0000 | INHALATION_SPRAY | Freq: Every day | RESPIRATORY_TRACT | Status: DC
  Administered 2015-02-11 – 2015-02-18 (×7): 1 via RESPIRATORY_TRACT
  Filled 2015-02-10: qty 60

## 2015-02-10 MED ORDER — ACETAMINOPHEN 650 MG RE SUPP: 650.0000 mg | Freq: Four times a day (QID) | RECTAL | Status: DC | PRN

## 2015-02-10 NOTE — ED Provider Notes (Signed)
[facility] Emergency Department Provider Note  ____________________________________________  Time seen: Approximately 4:00 PM  I have reviewed the triage vital signs and the nursing notes.   HISTORY  Chief Complaint Cough; Constipation; and Pneumonia    HPI Chelsea [name] is a 81 y.o. female with history of coronary artery disease status post CABG, hypertension, hyperlipidemia who presents for evaluation of generalized weakness over the past week, gradual onset, progressively worsening. Patient has had cough which is been nonproductive. She is also had constipation, no bowel movement in the past 8 days and she has had distention of her abdomen. She had one episode of vomiting 3 or 4 days ago but none since. No fever. She was seen by her primary care doctor yesterday and started on antibiotics for pneumonia and told she had "diverticulosis" - levaquin and flagyl. She has felt persistently weak. She has been short of breath. She denies chest pain. Current severity of symptoms is moderate. Modifying factors.   Past Medical History  Diagnosis Date  . Coronary artery disease     s/p CABG  . Hyperlipidemia   . Hypertension   . Bradycardia     hx of it and fatigue with beta blockade  . History of hyperkalemia   . CVD (cerebrovascular disease)     s/p carotid endarterectomy  . Stroke 1980s    left brain  . Mitral valve prolapse   . Osteoporosis   . Asthma   . Degenerative disc disease   . Spinal stenosis in cervical region   . Norovirus 2014    Patient Active Problem List   Diagnosis Date Noted  . Enterostenosis 02/09/2015  . Spinal stenosis 02/09/2015  . Peripheral blood vessel disorder 02/09/2015  . 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 OP (osteoporosis) 02/09/2015  . Billowing mitral valve 02/09/2015  . Degeneration macular 02/09/2015  . Hypercholesteremia 02/09/2015  . Adult hypothyroidism 02/09/2015  . Cannot sleep 02/09/2015  . Adaptive  colitis 02/09/2015  . Presence of aortocoronary bypass graft 02/09/2015  . 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 Accumulation of fluid in tissues 02/09/2015  . Esophagitis, reflux 02/09/2015  . Diverticulitis 02/09/2015  . Colon, diverticulosis 02/09/2015  . Constipation due to opioid therapy 02/09/2015  . CAD in native artery 02/09/2015  . Allergic rhinitis 02/09/2015  . Back ache 02/09/2015  . Airway hyperreactivity 02/09/2015  . COPD (chronic obstructive pulmonary disease) 01/15/2015  . GERD (gastroesophageal reflux disease) 12/23/2014  . Spinal stenosis at L4-L5 level 12/16/2014  . Lumbar nerve root compression 12/16/2014  . Compression fracture of lumbar vertebra 12/16/2014  . Lumbar canal stenosis 11/04/2013  . Neuritis or radiculitis due to rupture of lumbar intervertebral disc 11/04/2013  . DDD (degenerative disc disease), lumbar 11/04/2013  . Dermatophytosis of nail 03/31/2013  . Pain in limb 03/31/2013  . Preoperative cardiovascular examination 08/05/2012  . HTN (hypertension) 07/24/2011  . ABDOMINAL PAIN-GENERALIZED 07/21/2010  . Hyperlipidemia 01/31/2010  . DIZZINESS 01/31/2010  . Carotid artery stenosis 12/21/2009  . CORONARY ATHEROSLERO AUTOL VEIN BYPASS GRAFT 05/25/2009  . Dyspnea on exertion 05/25/2009    Past Surgical History  Procedure Laterality Date  . Coronary artery bypass graft    . Carotid endarterectomy      left  . Cholecystectomy    . Abdominal hysterectomy    . Bladder suspension    . Hip surgery  02/2011    right    Current Outpatient Rx  Name  Route  Sig  Dispense  Refill  . albuterol (  PROVENTIL HFA;VENTOLIN HFA) 108 (90 BASE) MCG/ACT inhaler   Inhalation   Inhale 2 puffs into the lungs every 6 (six) hours as needed for wheezing or shortness of breath.          Marland Kitchen alendronate (FOSAMAX) 70 MG tablet   Oral   Take 70 mg by mouth once a week.          Marland Kitchen amLODipine (NORVASC) 10 MG tablet   Oral   Take 10 mg by mouth daily.         Marland Kitchen  aspirin EC 81 MG tablet   Oral   Take 81 mg by mouth daily.         Marland Kitchen atorvastatin (LIPITOR) 80 MG tablet   Oral   Take 1 tablet (80 mg total) by mouth daily.   90 tablet   4   . beclomethasone (BECONASE-AQ) 42 MCG/SPRAY nasal spray   Each Nare   Place 1 spray into both nostrils 2 (two) times daily.          . calcium citrate-vitamin D (CITRACAL+D) 315-200 MG-UNIT per tablet   Oral   Take 1 tablet by mouth daily.         . diazepam (VALIUM) 5 MG tablet   Oral   Take 5 mg by mouth every 6 (six) hours as needed for anxiety.          . fluticasone (FLONASE) 50 MCG/ACT nasal spray   Each Nare   Place 2 sprays into both nostrils daily.         . Fluticasone Furoate-Vilanterol (BREO ELLIPTA) 100-25 MCG/INH AEPB   Inhalation   Inhale 1 puff into the lungs daily.         . lansoprazole (PREVACID) 30 MG capsule   Oral   Take 30 mg by mouth 2 (two) times daily.         Marland Kitchen levofloxacin (LEVAQUIN) 500 MG tablet   Oral   Take 1 tablet (500 mg total) by mouth daily.   7 tablet   0   . levothyroxine (SYNTHROID, LEVOTHROID) 88 MCG tablet   Oral   Take 88 mcg by mouth daily before breakfast.         . LORazepam (ATIVAN) 0.5 MG tablet   Oral   Take 0.5 mg by mouth daily as needed for anxiety.          . metaxalone (SKELAXIN) 800 MG tablet   Oral   Take 800 mg by mouth 3 (three) times daily as needed for muscle spasms.          . metroNIDAZOLE (FLAGYL) 500 MG tablet   Oral   Take 1 tablet (500 mg total) by mouth 3 (three) times daily.   21 tablet   0   . montelukast (SINGULAIR) 10 MG tablet   Oral   Take 10 mg by mouth at bedtime.         . Multiple Vitamins-Minerals (PRESERVISION AREDS PO)   Oral   Take 1 capsule by mouth daily.          . nitroGLYCERIN (NITROSTAT) 0.4 MG SL tablet   Sublingual   Place 1 tablet (0.4 mg total) under the tongue every 5 (five) minutes as needed. Patient taking differently: Place 0.4 mg under the tongue every 5  (five) minutes as needed for chest pain.    25 tablet   6   . oxybutynin (DITROPAN) 5 MG tablet   Oral   Take 5 mg  by mouth at bedtime.         Marland Kitchen oxyCODONE-acetaminophen (PERCOCET) 10-325 MG per tablet   Oral   Take 1 tablet by mouth every 6 (six) hours as needed for pain.   30 tablet   0   . senna (SENOKOT) 8.6 MG TABS tablet   Oral   Take 1 tablet by mouth as needed for mild constipation.         . sertraline (ZOLOFT) 100 MG tablet   Oral   Take 1.5 tablets (150 mg total) by mouth daily.   45 tablet   3   . predniSONE (DELTASONE) 10 MG tablet      6 po for 1 day and then 5 po for 1 day and then 4 po for 1 day and 3 po for 1 day and then 2 po for 1 day and then 1 po for 1 day. Patient not taking: Reported on 02/10/2015   21 tablet   0     Allergies Sulfonamide derivatives  Family History  Problem Relation Age of Onset  . Throat cancer Mother   . Stroke Father     Social History Social History  Substance Use Topics  . Smoking status: Former Research scientist (life sciences)  . Smokeless tobacco: Never Used     Comment: quit 50 years ago  . Alcohol Use: No    Review of Systems Constitutional: No fever/chills Eyes: No visual changes. ENT: No sore throat. Cardiovascular: Denies chest pain. Respiratory: Denies shortness of breath. Gastrointestinal: + abdominal distension.  No nausea, + vomiting.  No diarrhea.  + constipation. Genitourinary: Negative for dysuria. Musculoskeletal: Negative for back pain. Skin: Negative for rash. Neurological: Negative for headaches, focal weakness or numbness.  10-point ROS otherwise negative.  ____________________________________________   PHYSICAL EXAM:  VITAL SIGNS: ED Triage Vitals  Enc Vitals Group     BP 02/10/15 1409 100/77 mmHg     Pulse Rate 02/10/15 1409 77     Resp 02/10/15 1409 18     Temp 02/10/15 1409 98.1 F (36.7 C)     Temp Source 02/10/15 1409 Oral     SpO2 02/10/15 1409 93 %     Weight 02/10/15 1409 138 lb (62.596  kg)     Height 02/10/15 1409 5\' 5"  (1.651 m)     Head Cir --      Peak Flow --      Pain Score 02/10/15 1418 7     Pain Loc --      Pain Edu? --      Excl. in [place]? --     Constitutional: Alert and oriented. Nontoxic-appearing but tachypneic. Eyes: Conjunctivae are normal. PERRL. EOMI. Head: Atraumatic. Nose: No congestion/rhinnorhea. Mouth/Throat: Mucous membranes are moist.  Oropharynx non-erythematous. Neck: No stridor. Cardiovascular: Normal rate, regular rhythm. Grossly normal heart sounds.  Good peripheral circulation. Respiratory: Tachypnea with diminished breath sounds in the left base. Gastrointestinal: Soft with hypoactive bowel sounds, mild diffuse tenderness. Genitourinary: Deferred Musculoskeletal: No lower extremity tenderness nor edema.  No joint effusions. Neurologic:  Normal speech and language. No gross focal neurologic deficits are appreciated. No gait instability. Skin:  Skin is warm, dry and intact. No rash noted. Psychiatric: Mood and affect are normal. Speech and behavior are normal.  ____________________________________________   LABS (all labs ordered are listed, but only abnormal results are displayed)  Labs Reviewed  COMPREHENSIVE METABOLIC PANEL - Abnormal; Notable for the following:    Sodium 134 (*)    Chloride 99 (*)  Glucose, Bld 107 (*)    BUN 27 (*)    Creatinine, Ser 1.33 (*)    Calcium 8.4 (*)    Albumin 3.1 (*)    GFR calc non Af Amer 34 (*)    GFR calc Af Amer 40 (*)    All other components within normal limits  CBC - Abnormal; Notable for the following:    Hemoglobin 11.0 (*)    HCT 33.2 (*)    RDW 18.2 (*)    All other components within normal limits  URINALYSIS COMPLETEWITH MICROSCOPIC ([facility] ONLY) - Abnormal; Notable for the following:    Color, Urine YELLOW (*)    APPearance CLEAR (*)    Leukocytes, UA 2+ (*)    Squamous Epithelial / LPF 0-5 (*)    All other components within normal limits  TROPONIN I - Abnormal; Notable  for the following:    Troponin I 0.16 (*)    All other components within normal limits  CULTURE, BLOOD (ROUTINE X 2)  CULTURE, BLOOD (ROUTINE X 2)  CK  APTT  PROTIME-INR   ____________________________________________  EKG  ED ECG REPORT I, Joanne Gavel, the attending physician, personally viewed and interpreted this ECG.   Date: 02/10/2015  EKG Time: 14:27  Rate: 77  Rhythm: normal sinus rhythm  Axis: Normal  Intervals: RSR prime suggests RV conduction delay  ST&T Change: No acute ST segment elevation.  ____________________________________________  RADIOLOGY  3 view abdominal plain films IMPRESSION: Enlargement of cardiac silhouette.  COPD changes with increased RIGHT basilar atelectasis and LEFT lower lobe infiltrate.  Persistent enlargement of RIGHT pulmonary hilum with question nodular density in RIGHT upper lobe ; recommend attention on follow-up exams in 3-4 months as previously recommended.  Persisting gaseous distention of colon, question chronic ileus though distal colonic obstruction not excluded, unchanged from 02/09/2015.  Ct abdomen and pelvis  IMPRESSION: No evidence of bowel obstruction or acute bowel pathology.  Large hiatal hernia.  Bibasilar atelectasis with consolidation LEFT lower lobe and questionably RIGHT middle lobe.  Cystocele.  Compression fractures T11, L1, and newly L2.  ____________________________________________   PROCEDURES  Procedure(s) performed: None  Critical Care performed: Yes, see critical care note(s). Total critical care time spent 30 minutes.  ____________________________________________   INITIAL IMPRESSION / ASSESSMENT AND PLAN / ED COURSE  Pertinent labs & imaging results that were available during my care of the patient were reviewed by me and considered in my medical decision making (see chart for details).  LISHA VITALE is a 81 y.o. female with history of coronary artery disease status post  CABG, hypertension, hyperlipidemia who presents for evaluation of generalized weakness over the past week, gradual onset, progressively worsening. She is also had cough, persistent constipation and abdominal distention. On exam she is tachypnea with diminished breath sounds in the left lung fields concerning for persistent pneumonia. She also desaturates to 89% on room air and has no chronic oxygen requirement. Plan for screening labs, EKG, 3 view abdominal plain films, and  likely admission. Reassess for need for advanced imaging/CT of the abdomen and pelvis.  ----------------------------------------- 8:04 PM on 02/10/2015 -----------------------------------------  Troponin elevated 0.16. Aspirin ordered. Patient denies any chest pain at this time. Plain films confirm left lower lobe infiltrate. Plantars were concerning for possible bowel obstruction however CT of the abdomen and pelvis is negative for any obstruction. Urinalysis consistent with urinary tract infection. We'll give IV Levaquin. Case discussed with hospitalist for admission. ____________________________________________   FINAL CLINICAL IMPRESSION(S) /  ED DIAGNOSES  Final diagnoses:  Weakness  SOB (shortness of breath)  Pneumonia, organism unspecified  Hypoxia  NSTEMI (non-ST elevated myocardial infarction)      Joanne Gavel, MD 02/10/15 2005

## 2015-02-10 NOTE — Telephone Encounter (Signed)
-----  Message from Margarita Rana, MD sent at 02/10/2015  6:54 AM EDT ----- Please call and see how patient is doing. White count mildly elevated as expected.  Met C hemolyzed,  So can not see what potassium is. Please see if labs can be redrawn stat at the facility. Thanks.

## 2015-02-10 NOTE — Progress Notes (Addendum)
   02/10/15 2000  Clinical Encounter Type  Visited With Patient  Visit Type Spiritual support  Spiritual Encounters  Spiritual Needs Prayer  Stress Factors  Patient Stress Factors Health changes   Status: alert and oriented, melancholy/Pneumonia Family: none present but she says she has a friend visiting soon, Mrs. Candee Furbish, she said Faith: [employer] Visit Assessment: The chaplain gave encouraging words. She said this is her 2nd visit and she lost her husband.  Pastoral Care: [phone] PAGER or by online request

## 2015-02-10 NOTE — [facility] Notes (Signed)
ANTICOAGULATION CONSULT NOTE - Initial Consult  Pharmacy Consult for Lovenox  Indication: VTE prophylaxis  Allergies  Allergen Reactions  . Sulfonamide Derivatives Other (See Comments)    Reaction:  Dizziness     Patient Measurements: Height: 5\' 5"  (165.1 cm) Weight: 138 lb (62.596 kg) IBW/kg (Calculated) : 57 Heparin Dosing Weight:   Vital Signs: Temp: 98.4 F (36.9 C) (08/24 2135) Temp Source: Oral (08/24 2135) BP: 179/45 mmHg (08/24 2135) Pulse Rate: 68 (08/24 2135)  Labs:  Recent Labs  02/09/15 1608 02/10/15 1431 02/10/15 1635  HGB  --  11.0*  --   HCT 36.8 33.2*  --   PLT  --  331  --   APTT  --   --  29  LABPROT  --   --  13.9  INR  --   --  1.05  CREATININE 1.38* 1.33*  --   CKTOTAL  --   --  176  TROPONINI  --   --  0.16*    Estimated Creatinine Clearance: 25.8 mL/min (by C-G formula based on Cr of 1.33).   Medical History: Past Medical History  Diagnosis Date  . Coronary artery disease     s/p CABG  . Hyperlipidemia   . Hypertension   . Bradycardia     hx of it and fatigue with beta blockade  . History of hyperkalemia   . CVD (cerebrovascular disease)     s/p carotid endarterectomy  . Stroke 1980s    left brain  . Mitral valve prolapse   . Osteoporosis   . Asthma   . Degenerative disc disease   . Spinal stenosis in cervical region   . Norovirus 2014    Medications:  Prescriptions prior to admission  Medication Sig Dispense Refill Last Dose  . albuterol (PROVENTIL HFA;VENTOLIN HFA) 108 (90 BASE) MCG/ACT inhaler Inhale 2 puffs into the lungs every 6 (six) hours as needed for wheezing or shortness of breath.    PRN at PRN  . alendronate (FOSAMAX) 70 MG tablet Take 70 mg by mouth once a week.    unknown at unknown  . amLODipine (NORVASC) 10 MG tablet Take 10 mg by mouth daily.   unknown at unknown  . aspirin EC 81 MG tablet Take 81 mg by mouth daily.   unknown at unknown  . atorvastatin (LIPITOR) 80 MG tablet Take 1 tablet (80 mg total)  by mouth daily. 90 tablet 4 unknown at unknown  . beclomethasone (BECONASE-AQ) 42 MCG/SPRAY nasal spray Place 1 spray into both nostrils 2 (two) times daily.    unknown at unknown  . calcium citrate-vitamin D (CITRACAL+D) 315-200 MG-UNIT per tablet Take 1 tablet by mouth daily.   unknown at unknown  . diazepam (VALIUM) 5 MG tablet Take 5 mg by mouth every 6 (six) hours as needed for anxiety.    PRN at PRN  . fluticasone (FLONASE) 50 MCG/ACT nasal spray Place 2 sprays into both nostrils daily.   unknown at unknown  . Fluticasone Furoate-Vilanterol (BREO ELLIPTA) 100-25 MCG/INH AEPB Inhale 1 puff into the lungs daily.   unknown at unknown  . lansoprazole (PREVACID) 30 MG capsule Take 30 mg by mouth 2 (two) times daily.   unknown at unknown  . levofloxacin (LEVAQUIN) 500 MG tablet Take 1 tablet (500 mg total) by mouth daily. 7 tablet 0 unknown at unknown  . levothyroxine (SYNTHROID, LEVOTHROID) 88 MCG tablet Take 88 mcg by mouth daily before breakfast.   unknown at unknown  . LORazepam (  ATIVAN) 0.5 MG tablet Take 0.5 mg by mouth daily as needed for anxiety.    PRN at PRN  . metaxalone (SKELAXIN) 800 MG tablet Take 800 mg by mouth 3 (three) times daily as needed for muscle spasms.    PRN at PRN  . metroNIDAZOLE (FLAGYL) 500 MG tablet Take 1 tablet (500 mg total) by mouth 3 (three) times daily. 21 tablet 0 unknown at unknown  . montelukast (SINGULAIR) 10 MG tablet Take 10 mg by mouth at bedtime.   unknown at unknown  . Multiple Vitamins-Minerals (PRESERVISION AREDS PO) Take 1 capsule by mouth daily.    unknown at unknown  . nitroGLYCERIN (NITROSTAT) 0.4 MG SL tablet Place 1 tablet (0.4 mg total) under the tongue every 5 (five) minutes as needed. (Patient taking differently: Place 0.4 mg under the tongue every 5 (five) minutes as needed for chest pain. ) 25 tablet 6 PRN at PRN  . oxybutynin (DITROPAN) 5 MG tablet Take 5 mg by mouth at bedtime.   unknown at unknown  . oxyCODONE-acetaminophen (PERCOCET)  10-325 MG per tablet Take 1 tablet by mouth every 6 (six) hours as needed for pain. 30 tablet 0 PRN at PRN  . senna (SENOKOT) 8.6 MG TABS tablet Take 1 tablet by mouth as needed for mild constipation.   PRN at PRN  . sertraline (ZOLOFT) 100 MG tablet Take 1.5 tablets (150 mg total) by mouth daily. 45 tablet 3 unknown at unknown  . predniSONE (DELTASONE) 10 MG tablet 6 po for 1 day and then 5 po for 1 day and then 4 po for 1 day and 3 po for 1 day and then 2 po for 1 day and then 1 po for 1 day. (Patient not taking: Reported on 02/10/2015) 21 tablet 0 Taking    Assessment: CrCl = 25.8 ml/min  Goal of Therapy:  DVT prophylaxis  Plan:  Lovenox 40 mg SQ Q24H originally ordered.  Will adjust dose to Lovenox 30 mg SQ Q24H based on CrCl < 30 ml/min.   Jeannetta Cerutti D 02/10/2015,9:59 PM

## 2015-02-10 NOTE — Telephone Encounter (Signed)
Coughed all night, did have some fecal matter that she passed today. Does feel worse,  Will go to ER. Thanks.

## 2015-02-10 NOTE — Telephone Encounter (Signed)
Spoke with  [place] from the [facility]. Sharee Pimple, Rn stated that they only do labs on Monday's and Thursday's  At 7:30 am and will need an order for the [facility] lab to be faxed to [phone].  Thanks,

## 2015-02-10 NOTE — H&P (Signed)
[facility] [place] NAME: Chelsea [name]    MR#:  [id number]  DATE OF BIRTH:  07-27-1923  DATE OF ADMISSION:  02/10/2015  PRIMARY CARE PHYSICIAN: Margarita Rana, MD   REQUESTING/REFERRING PHYSICIAN: Dr. Loura Pardon  CHIEF COMPLAINT:   Chief Complaint  Patient presents with  . Cough  . Constipation  . Pneumonia    HISTORY OF PRESENT ILLNESS:  Chelsea [name]  is a 81 y.o. female with a known history of CAD status post CABG, hiatal hernia with GERD, history of CVA, COPD and asthma, degenerative disc disease and spinal stenosis with chronic back pain presents to the hospital secondary to worsening cough, fever and chills and abdominal pain. Patient started having chills 4 days ago with low-grade fever the next day. She also started to have a cough which was nonproductive at that time but became productive now. She was experiencing some difficulty breathing on exertion. She lives in an independent apartment at [address] and ambulates with a walker. She had a nurse, over to check on her couple of days ago and at that time she had just a low-grade fever. Her belly was hurting since yesterday, patient has trouble with chronic constipation and took MiraLAX which resulted in some diarrhea. Her PCP started her on Flagyl and Levaquin yesterday. Her symptoms were not improving, she just felt weak, couldn't do it anymore at home and so presented to the hospital. Chest x-ray here revealed bibasilar pneumonia versus on the left side. CT of the abdomen done for abdominal pain chest showed hiatal hernia and no acute findings.  PAST MEDICAL HISTORY:   Past Medical History  Diagnosis Date  . Coronary artery disease     s/p CABG  . Hyperlipidemia   . Hypertension   . Bradycardia     hx of it and fatigue with beta blockade  . History of hyperkalemia   . CVD (cerebrovascular disease)     s/p carotid endarterectomy  . Stroke 1980s    left brain  .  Mitral valve prolapse   . Osteoporosis   . Asthma   . Degenerative disc disease   . Spinal stenosis in cervical region   . Norovirus 2014    PAST SURGICAL HISTORY:   Past Surgical History  Procedure Laterality Date  . Coronary artery bypass graft    . Carotid endarterectomy      left  . Cholecystectomy    . Abdominal hysterectomy    . Bladder suspension    . Hip surgery  02/2011    right    SOCIAL HISTORY:   Social History  Substance Use Topics  . Smoking status: Former Research scientist (life sciences)  . Smokeless tobacco: Never Used     Comment: quit 50 years ago  . Alcohol Use: No    FAMILY HISTORY:   Family History  Problem Relation Age of Onset  . Throat cancer Mother   . Stroke Father     DRUG ALLERGIES:   Allergies  Allergen Reactions  . Sulfonamide Derivatives Other (See Comments)    Reaction:  Dizziness     REVIEW OF SYSTEMS:   Review of Systems  Constitutional: Positive for fever and chills. Negative for weight loss and malaise/fatigue.  HENT: Negative for ear discharge, ear pain, hearing loss, nosebleeds and tinnitus.   Eyes: Negative for blurred vision, double vision and photophobia.  Respiratory: Positive for cough and shortness of breath. Negative for hemoptysis and wheezing.   Cardiovascular: Negative  for chest pain, palpitations, orthopnea and leg swelling.  Gastrointestinal: Positive for heartburn, abdominal pain and constipation. Negative for nausea, vomiting, diarrhea and melena.  Genitourinary: Negative for dysuria, urgency, frequency and hematuria.  Musculoskeletal: Positive for myalgias and back pain. Negative for neck pain.  Skin: Negative for rash.  Neurological: Positive for weakness. Negative for dizziness, tingling, tremors, sensory change, speech change, focal weakness and headaches.  Endo/Heme/Allergies: Does not bruise/bleed easily.  Psychiatric/Behavioral: Negative for depression.    MEDICATIONS AT HOME:   Prior to Admission medications    Medication Sig Start Date End Date Taking? Authorizing Provider  albuterol (PROVENTIL HFA;VENTOLIN HFA) 108 (90 BASE) MCG/ACT inhaler Inhale 2 puffs into the lungs every 6 (six) hours as needed for wheezing or shortness of breath.    Yes Historical Provider, MD  alendronate (FOSAMAX) 70 MG tablet Take 70 mg by mouth once a week.    Yes Historical Provider, MD  amLODipine (NORVASC) 10 MG tablet Take 10 mg by mouth daily.   Yes Historical Provider, MD  aspirin EC 81 MG tablet Take 81 mg by mouth daily.   Yes Historical Provider, MD  atorvastatin (LIPITOR) 80 MG tablet Take 1 tablet (80 mg total) by mouth daily. 11/03/14  Yes Minna Merritts, MD  beclomethasone (BECONASE-AQ) 42 MCG/SPRAY nasal spray Place 1 spray into both nostrils 2 (two) times daily.    Yes Historical Provider, MD  calcium citrate-vitamin D (CITRACAL+D) 315-200 MG-UNIT per tablet Take 1 tablet by mouth daily.   Yes Historical Provider, MD  diazepam (VALIUM) 5 MG tablet Take 5 mg by mouth every 6 (six) hours as needed for anxiety.    Yes Historical Provider, MD  fluticasone (FLONASE) 50 MCG/ACT nasal spray Place 2 sprays into both nostrils daily.   Yes Historical Provider, MD  Fluticasone Furoate-Vilanterol (BREO ELLIPTA) 100-25 MCG/INH AEPB Inhale 1 puff into the lungs daily.   Yes Historical Provider, MD  lansoprazole (PREVACID) 30 MG capsule Take 30 mg by mouth 2 (two) times daily.   Yes Historical Provider, MD  levofloxacin (LEVAQUIN) 500 MG tablet Take 1 tablet (500 mg total) by mouth daily. 02/09/15  Yes Margarita Rana, MD  levothyroxine (SYNTHROID, LEVOTHROID) 88 MCG tablet Take 88 mcg by mouth daily before breakfast.   Yes Historical Provider, MD  LORazepam (ATIVAN) 0.5 MG tablet Take 0.5 mg by mouth daily as needed for anxiety.    Yes Historical Provider, MD  metaxalone (SKELAXIN) 800 MG tablet Take 800 mg by mouth 3 (three) times daily as needed for muscle spasms.    Yes Historical Provider, MD  metroNIDAZOLE (FLAGYL) 500 MG  tablet Take 1 tablet (500 mg total) by mouth 3 (three) times daily. 02/09/15  Yes Margarita Rana, MD  montelukast (SINGULAIR) 10 MG tablet Take 10 mg by mouth at bedtime.   Yes Historical Provider, MD  Multiple Vitamins-Minerals (PRESERVISION AREDS PO) Take 1 capsule by mouth daily.    Yes Historical Provider, MD  nitroGLYCERIN (NITROSTAT) 0.4 MG SL tablet Place 1 tablet (0.4 mg total) under the tongue every 5 (five) minutes as needed. Patient taking differently: Place 0.4 mg under the tongue every 5 (five) minutes as needed for chest pain.  08/27/13  Yes Minna Merritts, MD  oxybutynin (DITROPAN) 5 MG tablet Take 5 mg by mouth at bedtime.   Yes Historical Provider, MD  oxyCODONE-acetaminophen (PERCOCET) 10-325 MG per tablet Take 1 tablet by mouth every 6 (six) hours as needed for pain. 12/16/14  Yes Mar Daring, PA-C  senna (SENOKOT) 8.6 MG TABS tablet Take 1 tablet by mouth as needed for mild constipation.   Yes Historical Provider, MD  sertraline (ZOLOFT) 100 MG tablet Take 1.5 tablets (150 mg total) by mouth daily. 01/15/15  Yes Margarita Rana, MD  predniSONE (DELTASONE) 10 MG tablet 6 po for 1 day and then 5 po for 1 day and then 4 po for 1 day and 3 po for 1 day and then 2 po for 1 day and then 1 po for 1 day. Patient not taking: Reported on 02/10/2015 01/15/15   Margarita Rana, MD      VITAL SIGNS:  Blood pressure 148/57, pulse 69, temperature 98.1 F (36.7 C), temperature source Oral, resp. rate 25, height 5\' 5"  (1.651 m), weight 62.596 kg (138 lb), SpO2 97 %.  PHYSICAL EXAMINATION:   Physical Exam  GENERAL:  81 y.o.-year-old patient lying in the bed with no acute distress.  EYES: Pupils equal, round, reactive to light and accommodation. No scleral icterus. Extraocular muscles intact.  HEENT: Head atraumatic, normocephalic. Oropharynx and nasopharynx clear.  NECK:  Supple, no jugular venous distention. No thyroid enlargement, no tenderness.  LUNGS: Moving air bilaterally. Decreased  left basilar breath sounds, right basilar rhonchi posteriorly noted. No wheezing". No use of accessory muscles of respiration.  CARDIOVASCULAR: S1, S2 normal. No rubs, or gallops. 3/6 systolic murmur present. ABDOMEN: Soft, nondistended. Bowel sounds present. No organomegaly or mass.  Tenderness noted to palpation in the right lower quadrant and left upper quadrant. Voluntary guarding present, no rebound tenderness or rigidity noted. EXTREMITIES: No pedal edema, cyanosis, or clubbing.  NEUROLOGIC: Cranial nerves II through XII are intact. Muscle strength 5/5 in all extremities. Sensation intact. Gait not checked.  PSYCHIATRIC: The patient is alert and oriented x 3.  SKIN: No obvious rash, lesion, or ulcer.   LABORATORY PANEL:   CBC  Recent Labs Lab 02/10/15 1431  WBC 10.5  HGB 11.0*  HCT 33.2*  PLT 331   ------------------------------------------------------------------------------------------------------------------  Chemistries   Recent Labs Lab 02/10/15 1431  NA 134*  K 4.5  CL 99*  CO2 24  GLUCOSE 107*  BUN 27*  CREATININE 1.33*  CALCIUM 8.4*  AST 41  ALT 19  ALKPHOS 84  BILITOT 0.6   ------------------------------------------------------------------------------------------------------------------  Cardiac Enzymes  Recent Labs Lab 02/10/15 1635  TROPONINI 0.16*   ------------------------------------------------------------------------------------------------------------------  RADIOLOGY:  Dg Chest 2 View  02/09/2015   CLINICAL DATA:  Productive cough for 3 days. History of asthma. Previous smoker.  EXAM: CHEST  2 VIEW  COMPARISON:  Chest 12/29/2014.  CT chest 07/01/2014.  FINDINGS: Normal heart size and pulmonary vascularity. Emphysematous changes in the lungs. Central interstitial changes and peribronchial thickening suggesting chronic bronchitis. Focal infiltration and pleural thickening on the left is chronic. New nodular infiltrate in the right mid lung  measuring 1.5 cm diameter with moderately prominent right hilum. This appears to be new since previous study. Given the history of productive cough, this may represent focal infiltration due to pneumonia or inflammatory process. Short-term follow-up after treatment and resolution of acute process is recommended to exclude underlying neoplasm. Hiatal hernia behind the heart. Degenerative changes in the spine. Anterior compression of lower thoracic vertebrae. Bone changes are stable since previous studies.  IMPRESSION: Focal nodular infiltration in the right mid lung with mild prominence of right hilum. Followup PA and lateral chest X-ray is recommended in 3-4 weeks following trial of antibiotic therapy to ensure resolution and exclude underlying malignancy. Emphysematous changes and chronic bronchitic changes  in the lungs. Chronic infiltration and pleural thickening in the left base.   Electronically Signed   By: Lucienne Capers M.D.   On: 02/09/2015 20:09   Ct Abdomen Pelvis W Contrast  02/10/2015   CLINICAL DATA:  Nonproductive cough, shortness of breath and weakness since last Saturday, pneumonia, diverticulitis, no bowel movement since last Wednesday, abdominal distension question obstruction, history coronary disease, hypertension, stroke, former smoker  EXAM: CT ABDOMEN AND PELVIS WITH CONTRAST  TECHNIQUE: Multidetector CT imaging of the abdomen and pelvis was performed using the standard protocol following bolus administration of intravenous contrast. Sagittal and coronal MPR images reconstructed from axial data set.  CONTRAST:  16mL OMNIPAQUE IOHEXOL 300 MG/ML SOLN IV. Dilute oral contrast.  COMPARISON:  06/25/2014  FINDINGS: Bibasilar atelectasis with probable infiltrate in LEFT lower lobe.  Atelectasis versus consolidation at anterior medial aspect of RIGHT middle lobe base.  Extensive atherosclerotic calcifications.  Large hiatal hernia.  Gallbladder surgically absent.  Liver, spleen, pancreas,  kidneys, and adrenal glands normal.  Uterus surgically absent with nonvisualization of ovaries.  Beam hardening artifacts from orthopedic hardware at RIGHT hip obscure portions of pelvis.  Low descent of urinary bladder in pelvis compatible with cystocele.  Scattered fluid and gas within colon.  No evidence of bowel obstruction, bowel wall thickening, or free intraperitoneal air.  Remainder of stomach unremarkable.  Severe osseous demineralization.  Chronic compression fractures of T11 and L1 appear unchanged.  Interval superior and inferior endplate compression deformities with sclerosis at L2.  IMPRESSION: No evidence of bowel obstruction or acute bowel pathology.  Large hiatal hernia.  Bibasilar atelectasis with consolidation LEFT lower lobe and questionably RIGHT middle lobe.  Cystocele.  Compression fractures T11, L1, and newly L2.   Electronically Signed   By: Lavonia Dana M.D.   On: 02/10/2015 19:51   Dg Abd 2 Views  02/09/2015   CLINICAL DATA:  Hx constipation/LLQ abd pain x 2-3 days/no hx kidney stones/appendectomy & cholecystectomy/possible diverticulitis per pt  EXAM: ABDOMEN - 2 VIEW  COMPARISON:  06/28/2014  FINDINGS: There are distended loops of bowel with air-fluid levels, which appears to be the colon. This may be due to a diffuse colonic ileus. Distal colonic obstruction is not excluded.  No free air.  Soft tissues show vascular calcifications, but are otherwise unremarkable.  There is a levoscoliosis of the lumbar spine. There are degenerative changes of the lumbar spine. Bones are demineralized. There is a well aligned right hip prosthesis.  IMPRESSION: 1. Distended colon with air-fluid levels. This may reflect a colonic adynamic ileus or a distal colonic obstruction. No free air. No other acute finding.   Electronically Signed   By: Lajean Manes M.D.   On: 02/09/2015 20:01   Dg Abd Acute W/chest  02/10/2015   CLINICAL DATA:  Cough, abdominal distension, history hypertension, coronary artery  disease, former smoker  EXAM: DG ABDOMEN ACUTE W/ 1V CHEST  COMPARISON:  Abdominal radiographs 02/09/2015, chest radiograph 02/09/2015  FINDINGS: Enlargement of cardiac silhouette post median sternotomy.  Atherosclerotic calcification and elongation of thoracic aorta.  Emphysematous and bronchitic changes consistent with COPD.  Increased airspace infiltrate LEFT lower lobe.  Atelectasis RIGHT mid lung and RIGHT base slightly increased.  Prominence of RIGHT hilum and questioned nodular density in the RIGHT upper lobe again seen.  No pleural effusion or pneumothorax.  Bones demineralized.  Thoracolumbar scoliosis.  Colon remains air-filled and mildly distended, question ileus though distal obstruction not completely excluded.  Small bowel gas pattern normal.  No bowel wall thickening, or free intraperitoneal air.  RIGHT hip prosthesis noted.  Scattered phleboliths.  IMPRESSION: Enlargement of cardiac silhouette.  COPD changes with increased RIGHT basilar atelectasis and LEFT lower lobe infiltrate.  Persistent enlargement of RIGHT pulmonary hilum with question nodular density in RIGHT upper lobe ; recommend attention on follow-up exams in 3-4 months as previously recommended.  Persisting gaseous distention of colon, question chronic ileus though distal colonic obstruction not excluded, unchanged from 02/09/2015.   Electronically Signed   By: Lavonia Dana M.D.   On: 02/10/2015 17:54    EKG:   Orders placed or performed during the hospital encounter of 02/10/15  . ED EKG  . ED EKG    IMPRESSION AND PLAN:   Chelsea [name]  is a 81 y.o. female with a known history of CAD status post CABG, hiatal hernia with GERD, history of CVA, COPD and asthma, degenerative disc disease and spinal stenosis with chronic back pain presents to the hospital secondary to worsening cough, fever and chills and abdominal pain.  #1 Pneumonia-chest x-ray with left lower lobe infiltrate and right basilar atelectasis versus  infiltrate. -Increased cough. Also complains of significant weakness. -White count within normal limits, as started on antibiotics yesterday by PCP -Blood cultures ordered. -On IV Rocephin and azithromycin.  #2 elevated troponin-likely demand ischemia. Monitor on telemetry. -Recycle troponins. Patient denies any chest pain. No acute EKG changes.  #2 Abdominal pain with constipation-CT of the abdomen done -No acute abdominal pathology. Hiatal hernia noted. -Likely related to her constipation. And recent diarrhea with MiraLAX. -Laxitives as when necessary. No diarrhea now.  #3 COPD-table for now. Continue oxygen support -No need for systemic steroids as no wheezing noted. -Continue home medications  #4 HTN-continue no medications  #5 Chronic back pain-follows with Dr. Sharlet Salina, Physiotherapist. Known L2 fracture- started on pain meds- continue Walks with a walker- physical therapy consult  #6 DVT prophylaxis- Lovenox   All the records are reviewed and case discussed with ED provider. Management plans discussed with the patient, family and they are in agreement.  CODE STATUS: DNR- patient has her living will and yellow DNR form on the chart  [place] THIS PATIENT: 50 minutes.    Gladstone Lighter M.D on 02/10/2015 at 8:36 PM  Between 7am to 6pm - Pager - [phone]  After 6pm go to [email or web address] - password [email or web address]  [facility]  Office  [phone]  CC: Primary care physician; Margarita Rana, MD

## 2015-02-10 NOTE — ED Notes (Signed)
Pt states she has had nonproductive cough, sob, and weakness since last Saturday, saw her PCP yesterday and started on levaquin and flagyl and had a chest x-ray, states she was called by her PCP and told she had pneumonia and diverticulitis, states she also has not had a BM since last Wednesday

## 2015-02-10 NOTE — Telephone Encounter (Signed)
Pt stated that the nurse at [facility] told her to come to the nurses station to have blood drawn. Pt wanted to let Dr. Venia Minks know she doesn't feel like going downstairs to have any labs done today. Pt stated that she feels worse than she did yesterday. Pt request that Dr. Venia Minks call her some time today. Thanks TNP

## 2015-02-11 ENCOUNTER — Inpatient Hospital Stay (HOSPITAL_COMMUNITY)
Admission: RE | Admit: 2015-02-11 | Discharge: 2015-02-11 | Disposition: A | Discharge status: Home / Self Care | Payer: Medicare Other | Source: Ambulatory Visit | Attending: Internal Medicine | Admitting: Internal Medicine

## 2015-02-11 DIAGNOSIS — R0600 Dyspnea, unspecified: Secondary | ICD-10-CM

## 2015-02-11 LAB — CBC
HCT: 28.5 % — ABNORMAL LOW (ref 35.0–47.0)
Hemoglobin: 9.5 g/dL — ABNORMAL LOW (ref 12.0–16.0)
MCH: 29.1 pg (ref 26.0–34.0)
MCHC: 33.5 g/dL (ref 32.0–36.0)
MCV: 86.7 fL (ref 80.0–100.0)
Platelets: 296 K/uL (ref 150–440)
RBC: 3.29 MIL/uL — ABNORMAL LOW (ref 3.80–5.20)
RDW: 18.3 % — ABNORMAL HIGH (ref 11.5–14.5)
WBC: 9.7 K/uL (ref 3.6–11.0)

## 2015-02-11 LAB — BASIC METABOLIC PANEL WITH GFR
Anion gap: 9 (ref 5–15)
BUN: 22 mg/dL — ABNORMAL HIGH (ref 6–20)
CO2: 23 mmol/L (ref 22–32)
Calcium: 7.9 mg/dL — ABNORMAL LOW (ref 8.9–10.3)
Chloride: 103 mmol/L (ref 101–111)
Creatinine, Ser: 1.08 mg/dL — ABNORMAL HIGH (ref 0.44–1.00)
GFR calc Af Amer: 51 mL/min — ABNORMAL LOW
GFR calc non Af Amer: 44 mL/min — ABNORMAL LOW
Glucose, Bld: 104 mg/dL — ABNORMAL HIGH (ref 65–99)
Potassium: 4.4 mmol/L (ref 3.5–5.1)
Sodium: 135 mmol/L (ref 135–145)

## 2015-02-11 LAB — C DIFFICILE QUICK SCREEN W PCR REFLEX
C DIFFICLE (CDIFF) ANTIGEN: NEGATIVE
C Diff interpretation: NEGATIVE
C Diff toxin: NEGATIVE

## 2015-02-11 LAB — TROPONIN I
TROPONIN I: 0.03 ng/mL (ref <0.031)
Troponin I: 0.04 ng/mL — ABNORMAL HIGH
Troponin I: <0.03 ng/mL (ref <0.031)

## 2015-02-11 LAB — CKMB (ARMC ONLY)
CK, MB: 4.3 ng/mL (ref 0.5–5.0)
CK, MB: 6 ng/mL — AB (ref 0.5–5.0)
CK, MB: 5.4 ng/mL — ABNORMAL HIGH (ref 0.5–5.0)

## 2015-02-11 LAB — MRSA PCR SCREENING: MRSA BY PCR: NEGATIVE

## 2015-02-11 MED ORDER — ASPIRIN EC 81 MG PO TBEC: 81.0000 mg | DELAYED_RELEASE_TABLET | Freq: Every day | ORAL | Status: DC

## 2015-02-11 MED ORDER — BOOST / RESOURCE BREEZE PO LIQD
1.0000 | Freq: Two times a day (BID) | ORAL | Status: DC
  Administered 2015-02-12 – 2015-02-15 (×4): 1 via ORAL

## 2015-02-11 MED ORDER — HYDROCOD POLST-CPM POLST ER 10-8 MG/5ML PO SUER
5.0000 mL | Freq: Two times a day (BID) | ORAL | Status: DC
  Administered 2015-02-11 – 2015-02-15 (×9): 5 mL via ORAL
  Filled 2015-02-11 (×9): qty 5

## 2015-02-11 MED ORDER — ENOXAPARIN SODIUM 40 MG/0.4ML ~~LOC~~ SOLN
40.0000 mg | SUBCUTANEOUS | Status: DC
  Administered 2015-02-11 – 2015-02-17 (×7): 40 mg via SUBCUTANEOUS
  Filled 2015-02-11 (×7): qty 0.4

## 2015-02-11 MED ORDER — NITROGLYCERIN 2 % TD OINT
0.5000 [in_us] | TOPICAL_OINTMENT | Freq: Four times a day (QID) | TRANSDERMAL | Status: DC
  Filled 2015-02-11 (×3): qty 1

## 2015-02-11 MED ORDER — METOPROLOL SUCCINATE ER 25 MG PO TB24
12.5000 mg | ORAL_TABLET | Freq: Every day | ORAL | Status: DC
  Administered 2015-02-11 – 2015-02-18 (×7): 12.5 mg via ORAL
  Filled 2015-02-11 (×8): qty 1

## 2015-02-11 MED ORDER — GUAIFENESIN ER 600 MG PO TB12
600.0000 mg | ORAL_TABLET | Freq: Two times a day (BID) | ORAL | Status: DC
  Administered 2015-02-11 – 2015-02-18 (×14): 600 mg via ORAL
  Filled 2015-02-11 (×14): qty 1

## 2015-02-11 NOTE — Clinical Social Work Note (Signed)
Clinical Social Work Assessment  Patient Details  Name: Chelsea [name] MRN: [id number] Date of Birth: 1923/10/18  Date of referral:  02/11/15               Reason for consult:  Facility Placement (Pt is from [facility].)                Permission sought to share information with:  Chartered certified accountant granted to share information::  Yes, Verbal Permission Granted  Name::     [facility]::     Relationship::     Contact Information:     Housing/Transportation Living arrangements for the past 2 months:  Charity fundraiser of Information:  Patient Patient Interpreter Needed:  None Criminal Activity/Legal Involvement Pertinent to Current Situation/Hospitalization:  No - Comment as needed Significant Relationships:  Friend Lives with:  Self Do you feel safe going back to the place where you live?  Yes Need for family participation in patient care:  No (Coment)  Care giving concerns:  Pt stated that if she did have go to rehab she did not want to go to the respite section of [facility] [employer] [facility] (medical) / plan:  CSW spoke to pt she was sitting up in bed [employer].  She was concerned about being able to complete her ADL once she was DC'd from the hospital.  Pt stated that she normally uses a walker to ambulate.  CSW explained that because she was from [facility] she would be able to go to rehab there if she wanted that facility.  SHe stated that she would go only if she did not have to go to the respite portion of the facility.  CSW left VM for Seth Bake to inquire about possible SNF placement and what area she would go to for STR if it was needed.  CSW will f/u again with facility tomorrow.  Employment status:  Retired Forensic scientist:    PT Recommendations:    Information / Referral to community resources:     Patient/Family's Response to care:  Pt was in agreement with SNF if it was recommended.  Patient/Family's  Understanding of and Emotional Response to Diagnosis, Current Treatment, and Prognosis:  Pt was in agreement with SNF if it was recommended.  Pt verbalized understanding of this.  CSW will continue to follow.  Emotional Assessment Appearance:  Appears stated age Attitude/Demeanor/Rapport:   (appropriate) Affect (typically observed):  Appropriate Orientation:  Oriented to Self, Oriented to Place, Oriented to  Time, Oriented to Situation Alcohol / Substance use:  Never Used Psych involvement (Current and /or in the community):  No (Comment)  Discharge Needs  Concerns to be addressed:  No discharge needs identified (CSW currently waiting on PT notes to determine if SNF placement is needed or not.) Readmission within the last 30 days:  No Current discharge risk:  None Barriers to Discharge:  No Barriers Identified   Mathews Argyle, LCSW 02/11/2015, 3:27 PM

## 2015-02-11 NOTE — Evaluation (Signed)
Physical Therapy Evaluation Patient Details Name: Chelsea [name] MRN: [id number] DOB: 06-19-1924 Today's Date: 02/11/2015   History of Present Illness  81 yo female from [place] at [facility], now admitted for cough and constipation with ileus, has L2 compression fracture (new)  Clinical Impression  Pt was seen for eval and determined her needs are for SNF care then back to [place].  Her willingness to do therapy is also clear, and will be able to benefit.  O2 sats dropped with and without O2 via nasal cannula, and will need to retest this.    Follow Up Recommendations SNF    Equipment Recommendations  Rolling walker with 5" wheels    Recommendations for Other Services       Precautions / Restrictions Restrictions Weight Bearing Restrictions: No      Mobility  Bed Mobility Overal bed mobility: Needs Assistance Bed Mobility: Supine to Sit;Sit to Supine     Supine to sit: Min assist (lowered HOB for body mechanics) Sit to supine: Min assist   General bed mobility comments: used bedrails but not very effective UE use  Transfers Overall transfer level: Needs assistance Equipment used: Rolling walker (2 wheeled);1 person hand held assist Transfers: Sit to/from [facility] Sit to Stand: Min guard;Min assist Stand pivot transfers: Min guard       General transfer comment: Pt remembered hand placement  Ambulation/Gait Ambulation/Gait assistance: Min assist Ambulation Distance (Feet): 10 Feet (x1, 14 x 1) Assistive device: Rolling walker (2 wheeled);1 person hand held assist Gait Pattern/deviations: Step-through pattern;Shuffle;Narrow base of support;Trunk flexed Gait velocity: reduced Gait velocity interpretation: Below normal speed for age/gender General Gait Details: slow pace with SOB noted  Stairs            Wheelchair Mobility    Modified Rankin (Stroke Patients Only)       Balance Overall balance assessment: Needs  assistance Sitting-balance support: Feet supported Sitting balance-Leahy Scale: Fair   Postural control: Posterior lean Standing balance support: Bilateral upper extremity supported Standing balance-Leahy Scale: Poor                               Pertinent Vitals/Pain Pain Assessment: No/denies pain    Home Living Family/patient expects to be discharged to:: Other (Comment)                 Additional Comments: [place] [facility]    Prior Function Level of Independence: Independent with assistive device(s)         Comments: rollator used, owns [facility]     Hand Dominance        Extremity/Trunk Assessment   Upper Extremity Assessment: Overall WFL for tasks assessed           Lower Extremity Assessment: Generalized weakness      Cervical / Trunk Assessment: Kyphotic  Communication   Communication: No difficulties  Cognition Arousal/Alertness: Awake/alert Behavior During Therapy: WFL for tasks assessed/performed Overall Cognitive Status: Within Functional Limits for tasks assessed                      General Comments General comments (skin integrity, edema, etc.): Pt was noted to have O2 via nasal cannula for the first time and did have concerns.  Removed O2 and sats dropped to 84%.  With recovery, her O2 was attached and still drops to 89%.    Exercises  Assessment/Plan    PT Assessment Patient needs continued PT services  PT Diagnosis Generalized weakness   PT Problem List Decreased strength;Decreased range of motion;Decreased activity tolerance;Decreased balance;Decreased mobility;Decreased coordination;Decreased knowledge of use of DME;Decreased safety awareness;Decreased knowledge of precautions;Cardiopulmonary status limiting activity  PT Treatment Interventions DME instruction;Gait training;Functional mobility training;Therapeutic activities;Therapeutic exercise;Balance training;Neuromuscular re-education;Cognitive  remediation;Patient/family education   PT Goals (Current goals can be found in the Care Plan section) Acute Rehab PT Goals Patient Stated Goal: to get home PT Goal Formulation: With patient Time For Goal Achievement: 02/25/15 Potential to Achieve Goals: Good    Frequency Min 2X/week   Barriers to discharge Decreased caregiver support      Co-evaluation               End of Session Equipment Utilized During Treatment: Gait belt;Oxygen Activity Tolerance: Patient limited by fatigue;Patient limited by lethargy;Treatment limited secondary to medical complications (Comment) (lowered O2 sats) Patient left: in chair;with call bell/phone within reach Nurse Communication: Mobility status         Time: 7169-6789 PT Time Calculation (min) (ACUTE ONLY): 41 min   Charges:   PT Evaluation $Initial PT Evaluation Tier I: 1 Procedure PT Treatments $Gait Training: 8-22 mins $Therapeutic Activity: 8-22 mins   PT G Codes:        Ramond Dial 18-Feb-2015, 3:41 PM   Mee Hives, PT MS Acute Rehab Dept. Number: [facility] [id number] and [place] [phone]

## 2015-02-11 NOTE — Progress Notes (Signed)
*  PRELIMINARY RESULTS* Echocardiogram 2D Echocardiogram has been performed.  Chelsea [name] 02/11/2015, 5:25 PM

## 2015-02-11 NOTE — Progress Notes (Signed)
Chelsea [name] was the second nurse to verify patient's skin.

## 2015-02-11 NOTE — Progress Notes (Signed)
[facility] for Lovenox  Indication: VTE prophylaxis  Allergies  Allergen Reactions  . Sulfonamide Derivatives Other (See Comments)    Reaction:  Dizziness     Patient Measurements: Height: 5\' 5"  (165.1 cm) Weight: 138 lb 3.2 oz (62.687 kg) IBW/kg (Calculated) : 57   Vital Signs: Temp: 97.9 F (36.6 C) (08/25 1211) Temp Source: Oral (08/25 0535) BP: 129/44 mmHg (08/25 1211) Pulse Rate: 72 (08/25 1211)  Labs:  Recent Labs  02/09/15 1608 02/10/15 1431  02/10/15 1635 02/10/15 2328 02/11/15 0549 02/11/15 0944  HGB  --  11.0*  --   --   --  9.5*  --   HCT 36.8 33.2*  --   --   --  28.5*  --   PLT  --  331  --   --   --  296  --   APTT  --   --   --  29  --   --   --   LABPROT  --   --   --  13.9  --   --   --   INR  --   --   --  1.05  --   --   --   CREATININE 1.38* 1.33*  --   --   --  1.08*  --   CKTOTAL  --   --   --  176  --   --   --   CKMB  --   --   --   --  4.3 6.0* 5.4*  TROPONINI  --   --   < > 0.16* 0.03 0.04* <0.03  < > = values in this interval not displayed.  Estimated Creatinine Clearance: 31.8 mL/min (by C-G formula based on Cr of 1.08).   Medical History: Past Medical History  Diagnosis Date  . Coronary artery disease     s/p CABG  . Hyperlipidemia   . Hypertension   . Bradycardia     hx of it and fatigue with beta blockade  . History of hyperkalemia   . CVD (cerebrovascular disease)     s/p carotid endarterectomy  . Stroke 1980s    left brain  . Mitral valve prolapse   . Osteoporosis   . Asthma   . Degenerative disc disease   . Spinal stenosis in cervical region   . Norovirus 2014    Medications:  Prescriptions prior to admission  Medication Sig Dispense Refill Last Dose  . albuterol (PROVENTIL HFA;VENTOLIN HFA) 108 (90 BASE) MCG/ACT inhaler Inhale 2 puffs into the lungs every 6 (six) hours as needed for wheezing or shortness of breath.    PRN at PRN  . alendronate (FOSAMAX) 70 MG tablet  Take 70 mg by mouth once a week.    unknown at unknown  . amLODipine (NORVASC) 10 MG tablet Take 10 mg by mouth daily.   unknown at unknown  . aspirin EC 81 MG tablet Take 81 mg by mouth daily.   unknown at unknown  . atorvastatin (LIPITOR) 80 MG tablet Take 1 tablet (80 mg total) by mouth daily. 90 tablet 4 unknown at unknown  . beclomethasone (BECONASE-AQ) 42 MCG/SPRAY nasal spray Place 1 spray into both nostrils 2 (two) times daily.    unknown at unknown  . calcium citrate-vitamin D (CITRACAL+D) 315-200 MG-UNIT per tablet Take 1 tablet by mouth daily.   unknown at unknown  . diazepam (VALIUM) 5 MG tablet Take 5 mg by mouth  every 6 (six) hours as needed for anxiety.    PRN at PRN  . fluticasone (FLONASE) 50 MCG/ACT nasal spray Place 2 sprays into both nostrils daily.   unknown at unknown  . Fluticasone Furoate-Vilanterol (BREO ELLIPTA) 100-25 MCG/INH AEPB Inhale 1 puff into the lungs daily.   unknown at unknown  . lansoprazole (PREVACID) 30 MG capsule Take 30 mg by mouth 2 (two) times daily.   unknown at unknown  . levofloxacin (LEVAQUIN) 500 MG tablet Take 1 tablet (500 mg total) by mouth daily. 7 tablet 0 unknown at unknown  . levothyroxine (SYNTHROID, LEVOTHROID) 88 MCG tablet Take 88 mcg by mouth daily before breakfast.   unknown at unknown  . LORazepam (ATIVAN) 0.5 MG tablet Take 0.5 mg by mouth daily as needed for anxiety.    PRN at PRN  . metaxalone (SKELAXIN) 800 MG tablet Take 800 mg by mouth 3 (three) times daily as needed for muscle spasms.    PRN at PRN  . metroNIDAZOLE (FLAGYL) 500 MG tablet Take 1 tablet (500 mg total) by mouth 3 (three) times daily. 21 tablet 0 unknown at unknown  . montelukast (SINGULAIR) 10 MG tablet Take 10 mg by mouth at bedtime.   unknown at unknown  . Multiple Vitamins-Minerals (PRESERVISION AREDS PO) Take 1 capsule by mouth daily.    unknown at unknown  . nitroGLYCERIN (NITROSTAT) 0.4 MG SL tablet Place 1 tablet (0.4 mg total) under the tongue every 5 (five)  minutes as needed. (Patient taking differently: Place 0.4 mg under the tongue every 5 (five) minutes as needed for chest pain. ) 25 tablet 6 PRN at PRN  . oxybutynin (DITROPAN) 5 MG tablet Take 5 mg by mouth at bedtime.   unknown at unknown  . oxyCODONE-acetaminophen (PERCOCET) 10-325 MG per tablet Take 1 tablet by mouth every 6 (six) hours as needed for pain. 30 tablet 0 PRN at PRN  . senna (SENOKOT) 8.6 MG TABS tablet Take 1 tablet by mouth as needed for mild constipation.   PRN at PRN  . sertraline (ZOLOFT) 100 MG tablet Take 1.5 tablets (150 mg total) by mouth daily. 45 tablet 3 unknown at unknown  . predniSONE (DELTASONE) 10 MG tablet 6 po for 1 day and then 5 po for 1 day and then 4 po for 1 day and 3 po for 1 day and then 2 po for 1 day and then 1 po for 1 day. (Patient not taking: Reported on 02/10/2015) 21 tablet 0 Taking    Assessment: Patient receiving Lovenox 30 mg subq q24h for DVT prophylaxis.  Dose adjusted for CrCl<30 mL/min.  Renal function improved and est CrCl~32 mL/min  Goal of Therapy:  DVT prophylaxis  Plan:  Will transition patient back to Lovenox 40 mg subq q24h due to improvement in renal function (CrCl>30 mL/min).  Kynzlie Hilleary G 02/11/2015,12:18 PM

## 2015-02-11 NOTE — Progress Notes (Signed)
Patient had red spots on buttocks, but blanchable. pink foam dressing applied.

## 2015-02-11 NOTE — Progress Notes (Signed)
[facility] NAME: Chelsea [name]    MR#:  [id number]  DATE OF BIRTH:  April 17, 1924  SUBJECTIVE:  CHIEF COMPLAINT:   Chief Complaint  Patient presents with  . Cough  . Constipation  . Pneumonia   the patient is a 81 year old female with history of coronary artery disease status post coronary artery bypass grafting, bilateral hernia, gastroesophageal reflux disease, stroke, COPD who presents to the hospital with complaints of cough, fevers and chills as well as abdominal pain. Patient is self for cough and phlegm production of grayish color sputum, denies any significant shortness of breath. Afebrile today, however, remains on oxygen at 2 L of oxygen through nasal cannula and her O2 sats ranging between 91-94%. Chest x-ray and CT scan of abdomen and pelvis done in the emergency room revealed left lower lobe more than the right lower lobe  Pneumonia. Patient is complaining of diarrheal stool , nausea, vomiting , lower abdominal pains. Patient's blood cultures 2 are negative, stool cultures were negative for C. Difficile. Sputum cultures are not reported  Review of Systems  Constitutional: Negative for fever, chills and weight loss.  HENT: Negative for congestion.   Eyes: Negative for blurred vision and double vision.  Respiratory: Positive for cough, sputum production, shortness of breath and wheezing.   Cardiovascular: Negative for chest pain, palpitations, orthopnea, leg swelling and PND.  Gastrointestinal: Positive for nausea, vomiting, abdominal pain and diarrhea. Negative for constipation and blood in stool.  Genitourinary: Positive for dysuria. Negative for urgency, frequency and hematuria.  Musculoskeletal: Negative for falls.  Neurological: Negative for dizziness, tremors, focal weakness and headaches.  Endo/Heme/Allergies: Does not bruise/bleed easily.  Psychiatric/Behavioral: Negative for depression. The patient does not  have insomnia.     VITAL SIGNS: Blood pressure 129/44, pulse 72, temperature 97.9 F (36.6 C), temperature source Oral, resp. rate 18, height 5\' 5"  (1.651 m), weight 62.687 kg (138 lb 3.2 oz), SpO2 94 %.  PHYSICAL EXAMINATION:   GENERAL:  81 y.o.-year-old patient lying in the bed with no acute distress.  EYES: Pupils equal, round, reactive to light and accommodation. No scleral icterus. Extraocular muscles intact.  HEENT: Head atraumatic, normocephalic. Oropharynx and nasopharynx clear.  NECK:  Supple, no jugular venous distention. No thyroid enlargement, no tenderness.  LUNGS: Normal breath sounds bilaterally, no wheezing, rales,rhonchi or crepitation. No use of accessory muscles of respiration.  CARDIOVASCULAR: S1, S2 normal. No murmurs, rubs, or gallops.  ABDOMEN: Soft, nontender, nondistended. Bowel sounds present. No organomegaly or mass.  EXTREMITIES: No pedal edema, cyanosis, or clubbing.  NEUROLOGIC: Cranial nerves II through XII are intact. Muscle strength 5/5 in all extremities. Sensation intact. Gait not checked.  PSYCHIATRIC: The patient is alert and oriented x 3.  SKIN: No obvious rash, lesion, or ulcer.   ORDERS/RESULTS REVIEWED:   CBC  Recent Labs Lab 02/09/15 1608 02/10/15 1431 02/11/15 0549  WBC 11.1* 10.5 9.7  HGB  --  11.0* 9.5*  HCT 36.8 33.2* 28.5*  PLT  --  331 296  MCV  --  86.7 86.7  MCH 28.0 28.7 29.1  MCHC 31.0* 33.1 33.5  RDW 17.0* 18.2* 18.3*  LYMPHSABS 2.1  --   --   BASOSABS 0.0  --   --    ------------------------------------------------------------------------------------------------------------------  Chemistries   Recent Labs Lab 02/09/15 1608 02/10/15 1431 02/11/15 0549  NA 140 134* 135  K 7.9* 4.5 4.4  CL 97 99* 103  CO2 22 24 23  GLUCOSE 126* 107* 104*  BUN 29* 27* 22*  CREATININE 1.38* 1.33* 1.08*  CALCIUM 9.5 8.4* 7.9*  AST 22 41  --   ALT 14 19  --   ALKPHOS 98 84  --   BILITOT 0.6 0.6  --     ------------------------------------------------------------------------------------------------------------------ estimated creatinine clearance is 31.8 mL/min (by C-G formula based on Cr of 1.08). ------------------------------------------------------------------------------------------------------------------ No results for input(s): TSH, T4TOTAL, T3FREE, THYROIDAB in the last 72 hours.  Invalid input(s): FREET3  Cardiac Enzymes  Recent Labs Lab 02/10/15 2328 02/11/15 0549 02/11/15 0944  CKMB 4.3 6.0* 5.4*  TROPONINI 0.03 0.04* <0.03   ------------------------------------------------------------------------------------------------------------------ Invalid input(s): POCBNP ---------------------------------------------------------------------------------------------------------------  RADIOLOGY: Dg Chest 2 View  02/09/2015   CLINICAL DATA:  Productive cough for 3 days. History of asthma. Previous smoker.  EXAM: CHEST  2 VIEW  COMPARISON:  Chest 12/29/2014.  CT chest 07/01/2014.  FINDINGS: Normal heart size and pulmonary vascularity. Emphysematous changes in the lungs. Central interstitial changes and peribronchial thickening suggesting chronic bronchitis. Focal infiltration and pleural thickening on the left is chronic. New nodular infiltrate in the right mid lung measuring 1.5 cm diameter with moderately prominent right hilum. This appears to be new since previous study. Given the history of productive cough, this may represent focal infiltration due to pneumonia or inflammatory process. Short-term follow-up after treatment and resolution of acute process is recommended to exclude underlying neoplasm. Hiatal hernia behind the heart. Degenerative changes in the spine. Anterior compression of lower thoracic vertebrae. Bone changes are stable since previous studies.  IMPRESSION: Focal nodular infiltration in the right mid lung with mild prominence of right hilum. Followup PA and lateral  chest X-ray is recommended in 3-4 weeks following trial of antibiotic therapy to ensure resolution and exclude underlying malignancy. Emphysematous changes and chronic bronchitic changes in the lungs. Chronic infiltration and pleural thickening in the left base.   Electronically Signed   By: Lucienne Capers M.D.   On: 02/09/2015 20:09   Ct Abdomen Pelvis W Contrast  02/10/2015   CLINICAL DATA:  Nonproductive cough, shortness of breath and weakness since last Saturday, pneumonia, diverticulitis, no bowel movement since last Wednesday, abdominal distension question obstruction, history coronary disease, hypertension, stroke, former smoker  EXAM: CT ABDOMEN AND PELVIS WITH CONTRAST  TECHNIQUE: Multidetector CT imaging of the abdomen and pelvis was performed using the standard protocol following bolus administration of intravenous contrast. Sagittal and coronal MPR images reconstructed from axial data set.  CONTRAST:  83mL OMNIPAQUE IOHEXOL 300 MG/ML SOLN IV. Dilute oral contrast.  COMPARISON:  06/25/2014  FINDINGS: Bibasilar atelectasis with probable infiltrate in LEFT lower lobe.  Atelectasis versus consolidation at anterior medial aspect of RIGHT middle lobe base.  Extensive atherosclerotic calcifications.  Large hiatal hernia.  Gallbladder surgically absent.  Liver, spleen, pancreas, kidneys, and adrenal glands normal.  Uterus surgically absent with nonvisualization of ovaries.  Beam hardening artifacts from orthopedic hardware at RIGHT hip obscure portions of pelvis.  Low descent of urinary bladder in pelvis compatible with cystocele.  Scattered fluid and gas within colon.  No evidence of bowel obstruction, bowel wall thickening, or free intraperitoneal air.  Remainder of stomach unremarkable.  Severe osseous demineralization.  Chronic compression fractures of T11 and L1 appear unchanged.  Interval superior and inferior endplate compression deformities with sclerosis at L2.  IMPRESSION: No evidence of bowel  obstruction or acute bowel pathology.  Large hiatal hernia.  Bibasilar atelectasis with consolidation LEFT lower lobe and questionably RIGHT middle lobe.  Cystocele.  Compression fractures  T11, L1, and newly L2.   Electronically Signed   By: Lavonia Dana M.D.   On: 02/10/2015 19:51   Dg Abd 2 Views  02/09/2015   CLINICAL DATA:  Hx constipation/LLQ abd pain x 2-3 days/no hx kidney stones/appendectomy & cholecystectomy/possible diverticulitis per pt  EXAM: ABDOMEN - 2 VIEW  COMPARISON:  06/28/2014  FINDINGS: There are distended loops of bowel with air-fluid levels, which appears to be the colon. This may be due to a diffuse colonic ileus. Distal colonic obstruction is not excluded.  No free air.  Soft tissues show vascular calcifications, but are otherwise unremarkable.  There is a levoscoliosis of the lumbar spine. There are degenerative changes of the lumbar spine. Bones are demineralized. There is a well aligned right hip prosthesis.  IMPRESSION: 1. Distended colon with air-fluid levels. This may reflect a colonic adynamic ileus or a distal colonic obstruction. No free air. No other acute finding.   Electronically Signed   By: Lajean Manes M.D.   On: 02/09/2015 20:01   Dg Abd Acute W/chest  02/10/2015   CLINICAL DATA:  Cough, abdominal distension, history hypertension, coronary artery disease, former smoker  EXAM: DG ABDOMEN ACUTE W/ 1V CHEST  COMPARISON:  Abdominal radiographs 02/09/2015, chest radiograph 02/09/2015  FINDINGS: Enlargement of cardiac silhouette post median sternotomy.  Atherosclerotic calcification and elongation of thoracic aorta.  Emphysematous and bronchitic changes consistent with COPD.  Increased airspace infiltrate LEFT lower lobe.  Atelectasis RIGHT mid lung and RIGHT base slightly increased.  Prominence of RIGHT hilum and questioned nodular density in the RIGHT upper lobe again seen.  No pleural effusion or pneumothorax.  Bones demineralized.  Thoracolumbar scoliosis.  Colon remains  air-filled and mildly distended, question ileus though distal obstruction not completely excluded.  Small bowel gas pattern normal.  No bowel wall thickening, or free intraperitoneal air.  RIGHT hip prosthesis noted.  Scattered phleboliths.  IMPRESSION: Enlargement of cardiac silhouette.  COPD changes with increased RIGHT basilar atelectasis and LEFT lower lobe infiltrate.  Persistent enlargement of RIGHT pulmonary hilum with question nodular density in RIGHT upper lobe ; recommend attention on follow-up exams in 3-4 months as previously recommended.  Persisting gaseous distention of colon, question chronic ileus though distal colonic obstruction not excluded, unchanged from 02/09/2015.   Electronically Signed   By: Lavonia Dana M.D.   On: 02/10/2015 17:54    EKG:  Orders placed or performed during the hospital encounter of 02/10/15  . ED EKG  . ED EKG  . EKG 12-Lead  . EKG 12-Lead    ASSESSMENT AND PLAN:  Active Problems:   Pneumonia 1. Bilateral lower lobe pneumonia, questionable aspiration pneumonitis, continue patient on broad-spectrum antibiotic therapy with Rocephin and Zithromax, get sputum cultures. Get speech therapist involved for further recommendations 2.  Diarrhea, getting stool cultures, C. difficile toxin was negative, may initiate patient on Imodium if diarrhea does not subside 3. Lower abdominal pain, possibly related to urinary tract infection, question diarrhea follow with therapy 4. Pyuria. Suspected UTI, getting urine cultures. Continue Rocephin 5. Generalized weakness. Get physical therapist involved 6. Renal insufficiency, improving with IV fluids,  7. Elevated troponin. Likely demand ischemia , get cardiologist involved, get echocardiogram. Most recent from 2010 showed normal ejection fraction, start patient on aspirin, metoprolol as well as nitroglycerin provided her blood pressure tolerates   Management plans discussed with the patient, family and they are in  agreement.   DRUG ALLERGIES:  Allergies  Allergen Reactions  . Sulfonamide Derivatives Other (See  Comments)    Reaction:  Dizziness     CODE STATUS:     Code Status Orders        Start     Ordered   02/10/15 2146  Do not attempt resuscitation (DNR)   Continuous    Question Answer Comment  In the event of cardiac or respiratory ARREST Do not call a "code blue"   In the event of cardiac or respiratory ARREST Do not perform Intubation, CPR, defibrillation or ACLS   In the event of cardiac or respiratory ARREST Use medication by any route, position, wound care, and other measures to relive pain and suffering. May use oxygen, suction and manual treatment of airway obstruction as needed for comfort.      02/10/15 2145    Advance Directive Documentation        Most Recent Value   Type of Advance Directive  Healthcare Power of Attorney, Living will   Pre-existing out of facility DNR order (yellow form or pink MOST form)     "MOST" Form in Place?        TOTAL TIME TAKING CARE OF THIS PATIENT: 35 minutes.  Long discussion with patient's family about her care, treatment plan and discharge planning. All questions were answered, voiced understanding, time spent additional 10 minutes  Evon Dejarnett M.D on 02/11/2015 at 3:12 PM  Between 7am to 6pm - Pager - [phone]  After 6pm go to [email or web address] - password [email or web address] [facility]  [facility]  Office  [phone]  CC: Primary care physician; Margarita Rana, MD

## 2015-02-11 NOTE — Progress Notes (Signed)
SATURATION QUALIFICATIONS: (This note is used to comply with regulatory documentation for home oxygen)  Patient Saturations on Room Air at Rest = 91%  Patient Saturations on Room Air while Ambulating = 88%  Patient Saturations on 2 Liters of oxygen while Ambulating = 94%  Please briefly explain why patient needs home oxygen: Remains SOB with exertion unable to tolerate activity on RA, requires 2L with minimal exertion [employer]

## 2015-02-12 ENCOUNTER — Encounter: Payer: Self-pay | Admitting: Physician Assistant

## 2015-02-12 DIAGNOSIS — R7989 Other specified abnormal findings of blood chemistry: Secondary | ICD-10-CM

## 2015-02-12 DIAGNOSIS — R0602 Shortness of breath: Secondary | ICD-10-CM

## 2015-02-12 DIAGNOSIS — R778 Other specified abnormalities of plasma proteins: Secondary | ICD-10-CM | POA: Diagnosis present

## 2015-02-12 DIAGNOSIS — R531 Weakness: Secondary | ICD-10-CM | POA: Diagnosis present

## 2015-02-12 DIAGNOSIS — J189 Pneumonia, unspecified organism: Secondary | ICD-10-CM

## 2015-02-12 DIAGNOSIS — J069 Acute upper respiratory infection, unspecified: Secondary | ICD-10-CM | POA: Diagnosis present

## 2015-02-12 DIAGNOSIS — R0902 Hypoxemia: Secondary | ICD-10-CM | POA: Diagnosis present

## 2015-02-12 LAB — LACTIC ACID, PLASMA
Lactic Acid, Venous: 1 mmol/L (ref 0.5–2.0)
Lactic Acid, Venous: 0.8 mmol/L (ref 0.5–2.0)

## 2015-02-12 LAB — HEMOGLOBIN: Hemoglobin: 9.6 g/dL — ABNORMAL LOW (ref 12.0–16.0)

## 2015-02-12 LAB — CREATININE, SERUM
CREATININE: 1.07 mg/dL — AB (ref 0.44–1.00)
GFR calc Af Amer: 52 mL/min — ABNORMAL LOW (ref >60)
GFR, EST NON AFRICAN AMERICAN: 45 mL/min — AB (ref >60)

## 2015-02-12 MED ORDER — GLYCERIN (LAXATIVE) 2.1 G RE SUPP
1.0000 | Freq: Every day | RECTAL | Status: DC
  Filled 2015-02-12 (×6): qty 1

## 2015-02-12 MED ORDER — POLYETHYLENE GLYCOL 3350 17 G PO PACK
17.0000 g | PACK | Freq: Every day | ORAL | Status: DC
  Administered 2015-02-12 – 2015-02-15 (×4): 17 g via ORAL
  Filled 2015-02-12 (×5): qty 1

## 2015-02-12 NOTE — Progress Notes (Signed)
Bladder scan 375ml, MD made aware. No new orders at this time.

## 2015-02-12 NOTE — Progress Notes (Signed)
GI Note:  Agree with Chelsea [name]'s a/p.    Exam is notable for distension, very tympanic but CT a/p is re-assuring.  She also does not have abd pain,  Just TTP.      Suspect she has constipation and increased gas,  Mild chronic ileus.    Recs: - enema daily until stools - miralax 17 grams daily - GI PT for pelvic floor therapy as outpatient.   - will follow.

## 2015-02-12 NOTE — Progress Notes (Signed)
[facility] NAME: Chelsea [name]    MR#:  [id number]  DATE OF BIRTH:  1923/07/23  SUBJECTIVE:  CHIEF COMPLAINT:   Chief Complaint  Patient presents with  . Cough  . Constipation  . Pneumonia   the patient is a 81 year old female with history of coronary artery disease status post coronary artery bypass grafting, bilateral hernia, gastroesophageal reflux disease, stroke, COPD who presents to the hospital with complaints of cough, fevers and chills as well as abdominal pain. Patient is self for cough and phlegm production of grayish color sputum, denies any significant shortness of breath. Afebrile today, however, remains on oxygen at 2 L of oxygen through nasal cannula and her O2 sats ranging between 91-94%. Chest x-ray and CT scan of abdomen and pelvis done in the emergency room revealed left lower lobe more than the right lower lobe  Pneumonia. Patient's diarrheal stool has subsided , but abdominal pain is still there and patient is not willing to advance to clear liquid diet to full yet, although she requested it to be advanced later in the day     Review of Systems  Constitutional: Negative for fever, chills and weight loss.  HENT: Negative for congestion.   Eyes: Negative for blurred vision and double vision.  Respiratory: Positive for cough, sputum production, shortness of breath and wheezing.   Cardiovascular: Negative for chest pain, palpitations, orthopnea, leg swelling and PND.  Gastrointestinal: Positive for nausea, vomiting, abdominal pain and diarrhea. Negative for constipation and blood in stool.  Genitourinary: Positive for dysuria. Negative for urgency, frequency and hematuria.  Musculoskeletal: Negative for falls.  Neurological: Negative for dizziness, tremors, focal weakness and headaches.  Endo/Heme/Allergies: Does not bruise/bleed easily.  Psychiatric/Behavioral: Negative for depression. The patient does not  have insomnia.     VITAL SIGNS: Blood pressure 98/64, pulse 58, temperature 98 F (36.7 C), temperature source Oral, resp. rate 18, height 5\' 5"  (1.651 m), weight 62.687 kg (138 lb 3.2 oz), SpO2 91 %.  PHYSICAL EXAMINATION:   GENERAL:  81 y.o.-year-old patient lying in the bed with no acute distress.  EYES: Pupils equal, round, reactive to light and accommodation. No scleral icterus. Extraocular muscles intact.  HEENT: Head atraumatic, normocephalic. Oropharynx and nasopharynx clear.  NECK:  Supple, no jugular venous distention. No thyroid enlargement, no tenderness.  LUNGS: Diminished breath sounds bilaterally, crackles at bases, especially on the left anteriorly as well as posteriorly. No use of accessory muscles of respiration.  CARDIOVASCULAR: S1, S2 normal. No murmurs, rubs, or gallops.  ABDOMEN: Soft, diffusely and some voluntary guarding in the upper abdomen, no rebound , nondistended. Bowel sounds present. No organomegaly or mass.  EXTREMITIES: No pedal edema, cyanosis, or clubbing.  NEUROLOGIC: Cranial nerves II through XII are intact. Muscle strength 5/5 in all extremities. Sensation intact. Gait not checked.  PSYCHIATRIC: The patient is alert and oriented x 3.  SKIN: No obvious rash, lesion, or ulcer.   ORDERS/RESULTS REVIEWED:   CBC  Recent Labs Lab 02/09/15 1608 02/10/15 1431 02/11/15 0549 02/12/15 0613  WBC 11.1* 10.5 9.7  --   HGB  --  11.0* 9.5* 9.6*  HCT 36.8 33.2* 28.5*  --   PLT  --  331 296  --   MCV  --  86.7 86.7  --   MCH 28.0 28.7 29.1  --   MCHC 31.0* 33.1 33.5  --   RDW 17.0* 18.2* 18.3*  --   LYMPHSABS 2.1  --   --   --  BASOSABS 0.0  --   --   --    ------------------------------------------------------------------------------------------------------------------  Chemistries   Recent Labs Lab 02/09/15 1608 02/10/15 1431 02/11/15 0549 02/12/15 0613  NA 140 134* 135  --   K 7.9* 4.5 4.4  --   CL 97 99* 103  --   CO2 22 24 23   --    GLUCOSE 126* 107* 104*  --   BUN 29* 27* 22*  --   CREATININE 1.38* 1.33* 1.08* 1.07*  CALCIUM 9.5 8.4* 7.9*  --   AST 22 41  --   --   ALT 14 19  --   --   ALKPHOS 98 84  --   --   BILITOT 0.6 0.6  --   --    ------------------------------------------------------------------------------------------------------------------ estimated creatinine clearance is 32.1 mL/min (by C-G formula based on Cr of 1.07). ------------------------------------------------------------------------------------------------------------------ No results for input(s): TSH, T4TOTAL, T3FREE, THYROIDAB in the last 72 hours.  Invalid input(s): FREET3  Cardiac Enzymes  Recent Labs Lab 02/10/15 2328 02/11/15 0549 02/11/15 0944  CKMB 4.3 6.0* 5.4*  TROPONINI 0.03 0.04* <0.03   ------------------------------------------------------------------------------------------------------------------ Invalid input(s): POCBNP ---------------------------------------------------------------------------------------------------------------  RADIOLOGY: Ct Abdomen Pelvis W Contrast  02/10/2015   CLINICAL DATA:  Nonproductive cough, shortness of breath and weakness since last Saturday, pneumonia, diverticulitis, no bowel movement since last Wednesday, abdominal distension question obstruction, history coronary disease, hypertension, stroke, former smoker  EXAM: CT ABDOMEN AND PELVIS WITH CONTRAST  TECHNIQUE: Multidetector CT imaging of the abdomen and pelvis was performed using the standard protocol following bolus administration of intravenous contrast. Sagittal and coronal MPR images reconstructed from axial data set.  CONTRAST:  19mL OMNIPAQUE IOHEXOL 300 MG/ML SOLN IV. Dilute oral contrast.  COMPARISON:  06/25/2014  FINDINGS: Bibasilar atelectasis with probable infiltrate in LEFT lower lobe.  Atelectasis versus consolidation at anterior medial aspect of RIGHT middle lobe base.  Extensive atherosclerotic calcifications.  Large  hiatal hernia.  Gallbladder surgically absent.  Liver, spleen, pancreas, kidneys, and adrenal glands normal.  Uterus surgically absent with nonvisualization of ovaries.  Beam hardening artifacts from orthopedic hardware at RIGHT hip obscure portions of pelvis.  Low descent of urinary bladder in pelvis compatible with cystocele.  Scattered fluid and gas within colon.  No evidence of bowel obstruction, bowel wall thickening, or free intraperitoneal air.  Remainder of stomach unremarkable.  Severe osseous demineralization.  Chronic compression fractures of T11 and L1 appear unchanged.  Interval superior and inferior endplate compression deformities with sclerosis at L2.  IMPRESSION: No evidence of bowel obstruction or acute bowel pathology.  Large hiatal hernia.  Bibasilar atelectasis with consolidation LEFT lower lobe and questionably RIGHT middle lobe.  Cystocele.  Compression fractures T11, L1, and newly L2.   Electronically Signed   By: Lavonia Dana M.D.   On: 02/10/2015 19:51   Dg Abd Acute W/chest  02/10/2015   CLINICAL DATA:  Cough, abdominal distension, history hypertension, coronary artery disease, former smoker  EXAM: DG ABDOMEN ACUTE W/ 1V CHEST  COMPARISON:  Abdominal radiographs 02/09/2015, chest radiograph 02/09/2015  FINDINGS: Enlargement of cardiac silhouette post median sternotomy.  Atherosclerotic calcification and elongation of thoracic aorta.  Emphysematous and bronchitic changes consistent with COPD.  Increased airspace infiltrate LEFT lower lobe.  Atelectasis RIGHT mid lung and RIGHT base slightly increased.  Prominence of RIGHT hilum and questioned nodular density in the RIGHT upper lobe again seen.  No pleural effusion or pneumothorax.  Bones demineralized.  Thoracolumbar scoliosis.  Colon remains air-filled and mildly distended, question ileus  though distal obstruction not completely excluded.  Small bowel gas pattern normal.  No bowel wall thickening, or free intraperitoneal air.  RIGHT hip  prosthesis noted.  Scattered phleboliths.  IMPRESSION: Enlargement of cardiac silhouette.  COPD changes with increased RIGHT basilar atelectasis and LEFT lower lobe infiltrate.  Persistent enlargement of RIGHT pulmonary hilum with question nodular density in RIGHT upper lobe ; recommend attention on follow-up exams in 3-4 months as previously recommended.  Persisting gaseous distention of colon, question chronic ileus though distal colonic obstruction not excluded, unchanged from 02/09/2015.   Electronically Signed   By: Lavonia Dana M.D.   On: 02/10/2015 17:54    EKG:  Orders placed or performed during the hospital encounter of 02/10/15  . ED EKG  . ED EKG  . EKG 12-Lead  . EKG 12-Lead    ASSESSMENT AND PLAN:  Active Problems:   Pneumonia   Elevated troponin   Hypoxia   SOB (shortness of breath)   Weakness   Viral URI 1. Bilateral lower lobe pneumonia, questionable aspiration pneumonitis, continue patient on broad-spectrum antibiotic therapy with Rocephin and Zithromax, unable to obtain sputum cultures, blood cultures sent negative so far. No recommendations to change diet consistency by speech therapist 2.  Diarrhea, stool cultures are negative for pathogenic flora, C. difficile toxin was negative, diarrhea has subsided , advance diet to full liquid diet 3. Diffuse abdominal pain, initially thought to be related to urinary tract infection, but more diffuse today even in upper abdomen , lactic acid level was unremarkable as well as CT scan of abdomen , getting gastroenterologist involved for further recommendations could be due to test no spasms because of diarrhea 4. Pyuria. Suspected UTI, urine culture is not reported. Continue Rocephin, get urine culture. If it was not yet done 5. Generalized weakness. Get physical therapist involved, patient will be going back to [facility] facility where she came from, per care management 6. Renal insufficiency, stable with IV fluids, continue to  follow 7. Elevated troponin. Likely demand ischemia , appreciate cardiologist input, echocardiogram is completed,  pended. Most recent from 2010 showed normal ejection fraction, continue patient on aspirin, metoprolol as well as nitroglycerin provided her blood pressure tolerates. Appreciate cardiology input. No further testing at this time unless echocardiogram is abnormal   Management plans discussed with the patient, family and they are in agreement.   DRUG ALLERGIES:  Allergies  Allergen Reactions  . Sulfonamide Derivatives Other (See Comments)    Reaction:  Dizziness     CODE STATUS:     Code Status Orders        Start     Ordered   02/10/15 2146  Do not attempt resuscitation (DNR)   Continuous    Question Answer Comment  In the event of cardiac or respiratory ARREST Do not call a "code blue"   In the event of cardiac or respiratory ARREST Do not perform Intubation, CPR, defibrillation or ACLS   In the event of cardiac or respiratory ARREST Use medication by any route, position, wound care, and other measures to relive pain and suffering. May use oxygen, suction and manual treatment of airway obstruction as needed for comfort.      02/10/15 2145    Advance Directive Documentation        Most Recent Value   Type of Advance Directive  Healthcare Power of Attorney, Living will   Pre-existing out of facility DNR order (yellow form or pink MOST form)     "MOST" Form  in Place?        TOTAL TIME TAKING CARE OF THIS PATIENT: 35 minutes.  Discussed with cardiology and care Management  Latayvia Mandujano M.D on 02/12/2015 at 4:45 PM  Between 7am to 6pm - Pager - [phone]  After 6pm go to [email or web address] - password [email or web address]  [facility]  Office  [phone]  CC: Primary care physician; Margarita Rana, MD

## 2015-02-12 NOTE — Progress Notes (Signed)
Per GI give Miralax now, give enema tonight and start suppository on tomorrow.

## 2015-02-12 NOTE — Progress Notes (Signed)
Pt. Given tap water enema with positive results, large liqiud brown stool with solid stool mixed in.

## 2015-02-12 NOTE — Care Management Important Message (Signed)
Important Message  Patient Details  Name: Chelsea [name] MRN: [id number] Date of Birth: 1924/03/16   Medicare Important Message Given:  Yes-second notification given    Juliann Pulse A Allmond 02/12/2015, 10:07 AM

## 2015-02-12 NOTE — Consult Note (Addendum)
Cardiology Consultation Note  Patient ID: Chelsea [name], MRN: [id number], DOB/AGE: 22-Mar-1924 81 y.o. Admit date: 02/10/2015   Date of Consult: 02/12/2015 Primary Physician: Margarita Rana, MD Primary Cardiologist: Dr. Rockey Situ  Chief Complaint: Cough and constipation Reason for Consult: elevated troponin   HPI: 81 y.o. female with h/o CAD s/p remote 2 vessel CABG in 1989, carotid artery disease s/p CEA, history of left sided stroke, HTN, HLD, MVP, hyperkalemia, and asthma who presented to [facility] on 8/24 with cough and was found to have bilateral lower lobe infiltrate consistent with pneumonia. Cardiology was consulted for mildly elevated troponins.   Patient reports traveling to [place], returning to the area 02/03/2015. Soon after she developed nausea 6 days ago, sore throat, general malaise through the week, constipation. Took miralex with numerous liquidy bowel movements. Continue general malaise, cough, presenting yesterday with concern for pneumonia.   Minimal elevation of her cardiac enzymes, no complaints of chest pain or anginal equivalent symptoms. Only has general malaise from her infection.   EKG on today's visit showing normal sinus rhythm with rates in the 70s, incomplete right bundle branch block, no significant ST or T-wave changes   Past Medical History  Diagnosis Date  . Coronary artery disease     a. s/p 2 vessel CABG 1989; b. echo 2003: nl; c. cath 2006: patent grafts, EF 65%; d. nuclear stress test 2007: no ischemia, EF 81%  . Hyperlipidemia   . Hypertension   . Bradycardia     hx of it and fatigue with beta blockade  . History of hyperkalemia   . Carotid artery disease     s/p carotid endarterectomy  . Stroke 1980s    left brain  . Mitral valve prolapse   . Osteoporosis   . Asthma   . Degenerative disc disease   . Spinal stenosis in cervical region   . Norovirus 2014      Most Recent Cardiac Studies: 2 vessel bypass 1989  Echo 2003:  Normal  Cardiac  catheterization 2006:  Patent grafts, EF 65%  Nuclear stress test 2007: No ischemia, EF 81%   Surgical History:  Past Surgical History  Procedure Laterality Date  . Coronary artery bypass graft    . Carotid endarterectomy      left  . Cholecystectomy    . Abdominal hysterectomy    . Bladder suspension    . Hip surgery  02/2011    right     Home Meds: Prior to Admission medications   Medication Sig Start Date End Date Taking? Authorizing Provider  albuterol (PROVENTIL HFA;VENTOLIN HFA) 108 (90 BASE) MCG/ACT inhaler Inhale 2 puffs into the lungs every 6 (six) hours as needed for wheezing or shortness of breath.    Yes Historical Provider, MD  alendronate (FOSAMAX) 70 MG tablet Take 70 mg by mouth once a week.    Yes Historical Provider, MD  amLODipine (NORVASC) 10 MG tablet Take 10 mg by mouth daily.   Yes Historical Provider, MD  aspirin EC 81 MG tablet Take 81 mg by mouth daily.   Yes Historical Provider, MD  atorvastatin (LIPITOR) 80 MG tablet Take 1 tablet (80 mg total) by mouth daily. 11/03/14  Yes Minna Merritts, MD  beclomethasone (BECONASE-AQ) 42 MCG/SPRAY nasal spray Place 1 spray into both nostrils 2 (two) times daily.    Yes Historical Provider, MD  calcium citrate-vitamin D (CITRACAL+D) 315-200 MG-UNIT per tablet Take 1 tablet by mouth daily.   Yes Historical Provider, MD  diazepam (  VALIUM) 5 MG tablet Take 5 mg by mouth every 6 (six) hours as needed for anxiety.    Yes Historical Provider, MD  fluticasone (FLONASE) 50 MCG/ACT nasal spray Place 2 sprays into both nostrils daily.   Yes Historical Provider, MD  Fluticasone Furoate-Vilanterol (BREO ELLIPTA) 100-25 MCG/INH AEPB Inhale 1 puff into the lungs daily.   Yes Historical Provider, MD  lansoprazole (PREVACID) 30 MG capsule Take 30 mg by mouth 2 (two) times daily.   Yes Historical Provider, MD  levofloxacin (LEVAQUIN) 500 MG tablet Take 1 tablet (500 mg total) by mouth daily. 02/09/15  Yes Margarita Rana, MD  levothyroxine  (SYNTHROID, LEVOTHROID) 88 MCG tablet Take 88 mcg by mouth daily before breakfast.   Yes Historical Provider, MD  LORazepam (ATIVAN) 0.5 MG tablet Take 0.5 mg by mouth daily as needed for anxiety.    Yes Historical Provider, MD  metaxalone (SKELAXIN) 800 MG tablet Take 800 mg by mouth 3 (three) times daily as needed for muscle spasms.    Yes Historical Provider, MD  metroNIDAZOLE (FLAGYL) 500 MG tablet Take 1 tablet (500 mg total) by mouth 3 (three) times daily. 02/09/15  Yes Margarita Rana, MD  montelukast (SINGULAIR) 10 MG tablet Take 10 mg by mouth at bedtime.   Yes Historical Provider, MD  Multiple Vitamins-Minerals (PRESERVISION AREDS PO) Take 1 capsule by mouth daily.    Yes Historical Provider, MD  nitroGLYCERIN (NITROSTAT) 0.4 MG SL tablet Place 1 tablet (0.4 mg total) under the tongue every 5 (five) minutes as needed. Patient taking differently: Place 0.4 mg under the tongue every 5 (five) minutes as needed for chest pain.  08/27/13  Yes Minna Merritts, MD  oxybutynin (DITROPAN) 5 MG tablet Take 5 mg by mouth at bedtime.   Yes Historical Provider, MD  oxyCODONE-acetaminophen (PERCOCET) 10-325 MG per tablet Take 1 tablet by mouth every 6 (six) hours as needed for pain. 12/16/14  Yes Clearnce Sorrel Burnette, PA-C  senna (SENOKOT) 8.6 MG TABS tablet Take 1 tablet by mouth as needed for mild constipation.   Yes Historical Provider, MD  sertraline (ZOLOFT) 100 MG tablet Take 1.5 tablets (150 mg total) by mouth daily. 01/15/15  Yes Margarita Rana, MD  predniSONE (DELTASONE) 10 MG tablet 6 po for 1 day and then 5 po for 1 day and then 4 po for 1 day and 3 po for 1 day and then 2 po for 1 day and then 1 po for 1 day. Patient not taking: Reported on 02/10/2015 01/15/15   Margarita Rana, MD    Inpatient Medications:  . amLODipine  10 mg Oral Daily  . aspirin EC  81 mg Oral Daily  . atorvastatin  80 mg Oral Daily  . azithromycin  500 mg Intravenous Q24H  . calcium-vitamin D  1 tablet Oral Daily  .  cefTRIAXone (ROCEPHIN)  IV  1 g Intravenous Q24H  . chlorpheniramine-HYDROcodone  5 mL Oral Q12H  . enoxaparin (LOVENOX) injection  40 mg Subcutaneous Q24H  . feeding supplement  1 Container Oral BID BM  . fluticasone  2 spray Each Nare Daily  . Fluticasone Furoate-Vilanterol  1 puff Inhalation Daily  . guaiFENesin  600 mg Oral BID  . levothyroxine  88 mcg Oral QAC breakfast  . metoprolol succinate  12.5 mg Oral Daily  . montelukast  10 mg Oral QHS  . multivitamin-lutein  1 capsule Oral Daily  . nitroGLYCERIN  0.5 inch Topical 4 times per day  . oxybutynin  5 mg  Oral QHS  . pantoprazole  40 mg Oral BID  . sertraline  150 mg Oral Daily   . sodium chloride 60 mL/hr at 02/11/15 1335    Allergies:  Allergies  Allergen Reactions  . Sulfonamide Derivatives Other (See Comments)    Reaction:  Dizziness     Social History   Social History  . Marital Status: Widowed    Spouse Name: N/A  . Number of Children: N/A  . Years of Education: N/A   Occupational History  . Not on file.   Social History Main Topics  . Smoking status: Former Research scientist (life sciences)  . Smokeless tobacco: Never Used     Comment: quit 50 years ago  . Alcohol Use: No  . Drug Use: No  . Sexual Activity: Not on file   Other Topics Concern  . Not on file   Social History Narrative     Family History  Problem Relation Age of Onset  . Throat cancer Mother   . Stroke Father      Review of Systems: Review of Systems  Respiratory: Positive for cough.   Cardiovascular: Negative.   Gastrointestinal: Positive for nausea and diarrhea.  Musculoskeletal: Negative.   Neurological: Positive for weakness.  All other systems reviewed and are negative.   Labs:  Recent Labs  02/10/15 1635 02/10/15 2328 02/11/15 0549 02/11/15 0944  CKTOTAL 176  --   --   --   CKMB  --  4.3 6.0* 5.4*  TROPONINI 0.16* 0.03 0.04* <0.03   Lab Results  Component Value Date   WBC 9.7 02/11/2015   HGB 9.6* 02/12/2015   HCT 28.5*  02/11/2015   MCV 86.7 02/11/2015   PLT 296 02/11/2015     Recent Labs Lab 02/10/15 1431 02/11/15 0549 02/12/15 0613  NA 134* 135  --   K 4.5 4.4  --   CL 99* 103  --   CO2 24 23  --   BUN 27* 22*  --   CREATININE 1.33* 1.08* 1.07*  CALCIUM 8.4* 7.9*  --   PROT 7.1  --   --   BILITOT 0.6  --   --   ALKPHOS 84  --   --   ALT 19  --   --   AST 41  --   --   GLUCOSE 107* 104*  --    Lab Results  Component Value Date   CHOL 142 05/08/2014   HDL 57 05/08/2014   LDLCALC 62 05/08/2014   TRIG 114 05/08/2014   No results found for: DDIMER  Radiology/Studies:  Dg Chest 2 View  02/09/2015   CLINICAL DATA:  Productive cough for 3 days. History of asthma. Previous smoker.  EXAM: CHEST  2 VIEW  COMPARISON:  Chest 12/29/2014.  CT chest 07/01/2014.  FINDINGS: Normal heart size and pulmonary vascularity. Emphysematous changes in the lungs. Central interstitial changes and peribronchial thickening suggesting chronic bronchitis. Focal infiltration and pleural thickening on the left is chronic. New nodular infiltrate in the right mid lung measuring 1.5 cm diameter with moderately prominent right hilum. This appears to be new since previous study. Given the history of productive cough, this may represent focal infiltration due to pneumonia or inflammatory process. Short-term follow-up after treatment and resolution of acute process is recommended to exclude underlying neoplasm. Hiatal hernia behind the heart. Degenerative changes in the spine. Anterior compression of lower thoracic vertebrae. Bone changes are stable since previous studies.  IMPRESSION: Focal nodular infiltration in the right mid  lung with mild prominence of right hilum. Followup PA and lateral chest X-ray is recommended in 3-4 weeks following trial of antibiotic therapy to ensure resolution and exclude underlying malignancy. Emphysematous changes and chronic bronchitic changes in the lungs. Chronic infiltration and pleural thickening in  the left base.   Electronically Signed   By: Lucienne Capers M.D.   On: 02/09/2015 20:09   Ct Abdomen Pelvis W Contrast  02/10/2015   CLINICAL DATA:  Nonproductive cough, shortness of breath and weakness since last Saturday, pneumonia, diverticulitis, no bowel movement since last Wednesday, abdominal distension question obstruction, history coronary disease, hypertension, stroke, former smoker  EXAM: CT ABDOMEN AND PELVIS WITH CONTRAST  TECHNIQUE: Multidetector CT imaging of the abdomen and pelvis was performed using the standard protocol following bolus administration of intravenous contrast. Sagittal and coronal MPR images reconstructed from axial data set.  CONTRAST:  67mL OMNIPAQUE IOHEXOL 300 MG/ML SOLN IV. Dilute oral contrast.  COMPARISON:  06/25/2014  FINDINGS: Bibasilar atelectasis with probable infiltrate in LEFT lower lobe.  Atelectasis versus consolidation at anterior medial aspect of RIGHT middle lobe base.  Extensive atherosclerotic calcifications.  Large hiatal hernia.  Gallbladder surgically absent.  Liver, spleen, pancreas, kidneys, and adrenal glands normal.  Uterus surgically absent with nonvisualization of ovaries.  Beam hardening artifacts from orthopedic hardware at RIGHT hip obscure portions of pelvis.  Low descent of urinary bladder in pelvis compatible with cystocele.  Scattered fluid and gas within colon.  No evidence of bowel obstruction, bowel wall thickening, or free intraperitoneal air.  Remainder of stomach unremarkable.  Severe osseous demineralization.  Chronic compression fractures of T11 and L1 appear unchanged.  Interval superior and inferior endplate compression deformities with sclerosis at L2.  IMPRESSION: No evidence of bowel obstruction or acute bowel pathology.  Large hiatal hernia.  Bibasilar atelectasis with consolidation LEFT lower lobe and questionably RIGHT middle lobe.  Cystocele.  Compression fractures T11, L1, and newly L2.   Electronically Signed   By: Lavonia Dana M.D.   On: 02/10/2015 19:51   Dg Abd 2 Views  02/09/2015   CLINICAL DATA:  Hx constipation/LLQ abd pain x 2-3 days/no hx kidney stones/appendectomy & cholecystectomy/possible diverticulitis per pt  EXAM: ABDOMEN - 2 VIEW  COMPARISON:  06/28/2014  FINDINGS: There are distended loops of bowel with air-fluid levels, which appears to be the colon. This may be due to a diffuse colonic ileus. Distal colonic obstruction is not excluded.  No free air.  Soft tissues show vascular calcifications, but are otherwise unremarkable.  There is a levoscoliosis of the lumbar spine. There are degenerative changes of the lumbar spine. Bones are demineralized. There is a well aligned right hip prosthesis.  IMPRESSION: 1. Distended colon with air-fluid levels. This may reflect a colonic adynamic ileus or a distal colonic obstruction. No free air. No other acute finding.   Electronically Signed   By: Lajean Manes M.D.   On: 02/09/2015 20:01   Dg Abd Acute W/chest  02/10/2015   CLINICAL DATA:  Cough, abdominal distension, history hypertension, coronary artery disease, former smoker  EXAM: DG ABDOMEN ACUTE W/ 1V CHEST  COMPARISON:  Abdominal radiographs 02/09/2015, chest radiograph 02/09/2015  FINDINGS: Enlargement of cardiac silhouette post median sternotomy.  Atherosclerotic calcification and elongation of thoracic aorta.  Emphysematous and bronchitic changes consistent with COPD.  Increased airspace infiltrate LEFT lower lobe.  Atelectasis RIGHT mid lung and RIGHT base slightly increased.  Prominence of RIGHT hilum and questioned nodular density in the RIGHT upper lobe  again seen.  No pleural effusion or pneumothorax.  Bones demineralized.  Thoracolumbar scoliosis.  Colon remains air-filled and mildly distended, question ileus though distal obstruction not completely excluded.  Small bowel gas pattern normal.  No bowel wall thickening, or free intraperitoneal air.  RIGHT hip prosthesis noted.  Scattered phleboliths.   IMPRESSION: Enlargement of cardiac silhouette.  COPD changes with increased RIGHT basilar atelectasis and LEFT lower lobe infiltrate.  Persistent enlargement of RIGHT pulmonary hilum with question nodular density in RIGHT upper lobe ; recommend attention on follow-up exams in 3-4 months as previously recommended.  Persisting gaseous distention of colon, question chronic ileus though distal colonic obstruction not excluded, unchanged from 02/09/2015.   Electronically Signed   By: Lavonia Dana M.D.   On: 02/10/2015 17:54    EKG: NSR, 77 bpm, RSR', possible anterior infarct  Weights: Filed Weights   02/10/15 1409 02/10/15 2135  Weight: 138 lb (62.596 kg) 138 lb 3.2 oz (62.687 kg)     Physical Exam: Normal sinus rhythm on telemetry Blood pressure 129/45, pulse 68, temperature 98.4 F (36.9 C), temperature source Oral, resp. rate 20, height 5\' 5"  (1.651 m), weight 138 lb 3.2 oz (62.687 kg), SpO2 94 %. Body mass index is 23 kg/(m^2). General: Well developed, well nourished, in no acute distress. Head: Normocephalic, atraumatic, sclera non-icteric, no xanthomas, nares are without discharge.  Neck: Negative for carotid bruits. JVD not elevated. Lungs: Clear bilaterally to auscultation without wheezes, rales, or rhonchi. Breathing is unlabored. Heart: RRR with S1 S2. No murmurs, rubs, or gallops appreciated. Abdomen: Soft, non-tender, non-distended with normoactive bowel sounds. No hepatomegaly. No rebound/guarding. No obvious abdominal masses. Msk:  Strength and tone appear normal for age. Extremities: No clubbing or cyanosis. No edema.  Distal pedal pulses are 2+ and equal bilaterally. Neuro: Alert and oriented X 3. No facial asymmetry. No focal deficit. Moves all extremities spontaneously. Psych:  Responds to questions appropriately with a normal affect.    Assessment and Plan:  81 y.o. female with h/o CAD s/p remote 2 vessel CABG in 1989, carotid artery disease s/p CEA, history of left sided  stroke, HTN, HLD, MVP, hyperkalemia, and asthma who presented to [facility] on 8/[facility] with cough and was found to have left lower lobe infiltrate consistent with pneumonia.  1. Elevated troponin: -Likely demand ischemia in the setting of viral URI, pneumonia Echocardiogram reading is pending No further testing at this time, no ischemia workup indicated  2. CAD s/p remote 2 vessel CABG in 1989: -Currently with no symptoms of angina We'll continue medical management at this time  3. Bilateral lower lobe PNA: -Suspect viral URI given symptoms over the past week including: Sore throat, malaise, GI upset, cough  4. Diarrhea: -Negative C diff -Per IM  5. UTI/abdominal pain: -Long history of constipation, Dettmer lacks with numerous liquidy bowel movements. Tolerating liquids at this time  6. Renal insufficiency: -Improved with IV fluids  7. Carotid artery disease s/p CEA: -Follow up as outpatient   8. HTN: -Stable -Continue current medications  9. HLD: -Lipitor 80 mg  10. MVP: -Echo ordered as above  11. History of hyperkalemia: -Stable  12. History of asthma: -   Signed, Christell Faith, PA-C Pager: [phone] 02/12/2015, 8:37 AM   Attending Note Patient seen and examined, agree with detailed note above,  Patient presentation and plan discussed on rounds.   Symptoms as above suggestive of viral URI Recent trip to [place] on a plane, got back on August 17 Symptoms started  several days later Minimal cardiac enzyme elevation, suspect demand ischemia Would recommend medical management for cardiac issues at this time Echocardiogram pending Tolerating liquid diet, significant malaise. --No ischemia workup at this time. This can be done as an outpatient if indicated  Signed: Esmond Plants  M.D., Ph.D. [facility]

## 2015-02-12 NOTE — Consult Note (Signed)
GI Inpatient Consult Note  Reason for Consult: Abdominal Pain   Attending Requesting Consult: [place]  History of Present Illness: Chelsea [name] is a 81 y.o. female with a h/o multiple chronic medical conditions admitted for pneumonia, cough, and constipation.  She presented to the [facility] ED with generalized weakness, nonproductive cough, and constipation x 8 days.  Patient reported she had not had a BM in this time and her abdomen was becoming more distended.  She also endorsed one episode of vomiting 3 days ago w/o recurrence.  Associated symptoms include fatigue and SOB.  CT abd/pelvis was negative for acute findings, no evidence of bowel obstruction, wall thickening, or free air.  It did show a large HH. Labs were remarkable for Hgb of 11.0 (which has decreased to 9.6 over the last two days).  She presented to her PCP on 02/09/15 for these same symptoms.  She was started on a 7-day course of cipro and flagyl empirically for suspected diverticulitis d/t LLQ tenderness to palpation.  Outpatient abdominal xray showed persistent distention of colon, could not exclude chronic distal colonic obstruction vs chronic ileus. CT scan in ED did not show evidence of obstruction or diverticulitis.  Of note, patient takes Percocet 10-325mg  q 6 prns for spinal compression fractures.  Today, Chelsea [name] reports abdominal pain only upon pressing stomach.  She states she did not even know her stomach hurt until her PCP pressed on it during her office visit.  She still gets nauseous occasionally, but denies further vomiting.  Per her friend/neighbor present during the visit, she has been feeling weak and fatigued with a cough and decreased appetite for 7-10 days.  Prior to seeing her PCP, her only GI-related complaint was constipation.  She had some diarrhea yesterday since re-starting Miralax, none today.  She denies constipation alarm symptoms including  weight loss, hematochezia, anemia, melena, and hx of colon  cancer or polyps.   Past Medical History:  Past Medical History  Diagnosis Date  . Coronary artery disease     a. s/p 2 vessel CABG 1989; b. echo 2003: nl; c. cath 2006: patent grafts, EF 65%; d. nuclear stress test 2007: no ischemia, EF 81%  . Hyperlipidemia   . Hypertension   . Bradycardia     hx of it and fatigue with beta blockade  . History of hyperkalemia   . Carotid artery disease     s/p carotid endarterectomy  . Stroke 1980s    left brain  . Mitral valve prolapse   . Osteoporosis   . Asthma   . Degenerative disc disease   . Spinal stenosis in cervical region   . Norovirus 2014    Problem List: Patient Active Problem List   Diagnosis Date Noted  . Elevated troponin   . Hypoxia   . SOB (shortness of breath)   . Weakness   . Viral URI   . Pneumonia 02/10/2015  . Enterostenosis 02/09/2015  . Spinal stenosis 02/09/2015  . Peripheral blood vessel disorder 02/09/2015  . 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 OP (osteoporosis) 02/09/2015  . Billowing mitral valve 02/09/2015  . Degeneration macular 02/09/2015  . Hypercholesteremia 02/09/2015  . Adult hypothyroidism 02/09/2015  . Cannot sleep 02/09/2015  . 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 Presence of aortocoronary bypass graft 02/09/2015  . 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 Accumulation of fluid in tissues 02/09/2015  . Esophagitis, reflux 02/09/2015  . Diverticulitis 02/09/2015  . Colon, diverticulosis 02/09/2015  . Constipation due to opioid therapy 02/09/2015  .  CAD in native artery 02/09/2015  . Allergic rhinitis 02/09/2015  . Back ache 02/09/2015  . Airway hyperreactivity 02/09/2015  . COPD (chronic obstructive pulmonary disease) 01/15/2015  . GERD (gastroesophageal reflux disease) 12/23/2014  . Spinal stenosis at L4-L5 level 12/16/2014  . Lumbar nerve root compression 12/16/2014  . Compression fracture of lumbar vertebra 12/16/2014  . Lumbar canal stenosis 11/04/2013  . Neuritis or radiculitis due to  rupture of lumbar intervertebral disc 11/04/2013  . DDD (degenerative disc disease), lumbar 11/04/2013  . Dermatophytosis of nail 03/31/2013  . Pain in limb 03/31/2013  . Preoperative cardiovascular examination 08/05/2012  . HTN (hypertension) 07/24/2011  . ABDOMINAL PAIN-GENERALIZED 07/21/2010  . Hyperlipidemia 01/31/2010  . DIZZINESS 01/31/2010  . Carotid artery stenosis 12/21/2009  . CORONARY ATHEROSLERO AUTOL VEIN BYPASS GRAFT 05/25/2009  . Dyspnea on exertion 05/25/2009    Past Surgical History: Past Surgical History  Procedure Laterality Date  . Coronary artery bypass graft    . Carotid endarterectomy      left  . Cholecystectomy    . Abdominal hysterectomy    . Bladder suspension    . Hip surgery  02/2011    right    Allergies: Allergies  Allergen Reactions  . Sulfonamide Derivatives Other (See Comments)    Reaction:  Dizziness     Home Medications: Prescriptions prior to admission  Medication Sig Dispense Refill Last Dose  . albuterol (PROVENTIL HFA;VENTOLIN HFA) 108 (90 BASE) MCG/ACT inhaler Inhale 2 puffs into the lungs every 6 (six) hours as needed for wheezing or shortness of breath.    PRN at PRN  . alendronate (FOSAMAX) 70 MG tablet Take 70 mg by mouth once a week.    unknown at unknown  . amLODipine (NORVASC) 10 MG tablet Take 10 mg by mouth daily.   unknown at unknown  . aspirin EC 81 MG tablet Take 81 mg by mouth daily.   unknown at unknown  . atorvastatin (LIPITOR) 80 MG tablet Take 1 tablet (80 mg total) by mouth daily. 90 tablet 4 unknown at unknown  . beclomethasone (BECONASE-AQ) 42 MCG/SPRAY nasal spray Place 1 spray into both nostrils 2 (two) times daily.    unknown at unknown  . calcium citrate-vitamin D (CITRACAL+D) 315-200 MG-UNIT per tablet Take 1 tablet by mouth daily.   unknown at unknown  . diazepam (VALIUM) 5 MG tablet Take 5 mg by mouth every 6 (six) hours as needed for anxiety.    PRN at PRN  . fluticasone (FLONASE) 50 MCG/ACT nasal spray  Place 2 sprays into both nostrils daily.   unknown at unknown  . Fluticasone Furoate-Vilanterol (BREO ELLIPTA) 100-25 MCG/INH AEPB Inhale 1 puff into the lungs daily.   unknown at unknown  . lansoprazole (PREVACID) 30 MG capsule Take 30 mg by mouth 2 (two) times daily.   unknown at unknown  . levofloxacin (LEVAQUIN) 500 MG tablet Take 1 tablet (500 mg total) by mouth daily. 7 tablet 0 unknown at unknown  . levothyroxine (SYNTHROID, LEVOTHROID) 88 MCG tablet Take 88 mcg by mouth daily before breakfast.   unknown at unknown  . LORazepam (ATIVAN) 0.5 MG tablet Take 0.5 mg by mouth daily as needed for anxiety.    PRN at PRN  . metaxalone (SKELAXIN) 800 MG tablet Take 800 mg by mouth 3 (three) times daily as needed for muscle spasms.    PRN at PRN  . metroNIDAZOLE (FLAGYL) 500 MG tablet Take 1 tablet (500 mg total) by mouth 3 (three) times daily. 21  tablet 0 unknown at unknown  . montelukast (SINGULAIR) 10 MG tablet Take 10 mg by mouth at bedtime.   unknown at unknown  . Multiple Vitamins-Minerals (PRESERVISION AREDS PO) Take 1 capsule by mouth daily.    unknown at unknown  . nitroGLYCERIN (NITROSTAT) 0.4 MG SL tablet Place 1 tablet (0.4 mg total) under the tongue every 5 (five) minutes as needed. (Patient taking differently: Place 0.4 mg under the tongue every 5 (five) minutes as needed for chest pain. ) 25 tablet 6 PRN at PRN  . oxybutynin (DITROPAN) 5 MG tablet Take 5 mg by mouth at bedtime.   unknown at unknown  . oxyCODONE-acetaminophen (PERCOCET) 10-325 MG per tablet Take 1 tablet by mouth every 6 (six) hours as needed for pain. 30 tablet 0 PRN at PRN  . senna (SENOKOT) 8.6 MG TABS tablet Take 1 tablet by mouth as needed for mild constipation.   PRN at PRN  . sertraline (ZOLOFT) 100 MG tablet Take 1.5 tablets (150 mg total) by mouth daily. 45 tablet 3 unknown at unknown  . predniSONE (DELTASONE) 10 MG tablet 6 po for 1 day and then 5 po for 1 day and then 4 po for 1 day and 3 po for 1 day and then 2  po for 1 day and then 1 po for 1 day. (Patient not taking: Reported on 02/10/2015) 21 tablet 0 Taking   Home medication reconciliation was completed with the patient.   Scheduled Inpatient Medications:   . amLODipine  10 mg Oral Daily  . aspirin EC  81 mg Oral Daily  . atorvastatin  80 mg Oral Daily  . azithromycin  500 mg Intravenous Q24H  . calcium-vitamin D  1 tablet Oral Daily  . cefTRIAXone (ROCEPHIN)  IV  1 g Intravenous Q24H  . chlorpheniramine-HYDROcodone  5 mL Oral Q12H  . enoxaparin (LOVENOX) injection  40 mg Subcutaneous Q24H  . feeding supplement  1 Container Oral BID BM  . fluticasone  2 spray Each Nare Daily  . Fluticasone Furoate-Vilanterol  1 puff Inhalation Daily  . guaiFENesin  600 mg Oral BID  . levothyroxine  88 mcg Oral QAC breakfast  . metoprolol succinate  12.5 mg Oral Daily  . montelukast  10 mg Oral QHS  . multivitamin-lutein  1 capsule Oral Daily  . nitroGLYCERIN  0.5 inch Topical 4 times per day  . oxybutynin  5 mg Oral QHS  . pantoprazole  40 mg Oral BID  . sertraline  150 mg Oral Daily    Continuous Inpatient Infusions:   . sodium chloride 60 mL/hr at 02/11/15 1335    PRN Inpatient Medications:  acetaminophen **OR** acetaminophen, albuterol, bisacodyl, diazepam, docusate sodium, LORazepam, metaxalone, nitroGLYCERIN, ondansetron **OR** ondansetron (ZOFRAN) IV, oxyCODONE-acetaminophen **AND** oxyCODONE, senna  Family History: family history includes Stroke in her father; Throat cancer in her mother.  The patient's family history is negative for inflammatory bowel disorders, GI malignancy, or solid organ transplantation.  Social History:   reports that she has quit smoking. She has never used smokeless tobacco. She reports that she does not drink alcohol or use illicit drugs. The patient denies ETOH, tobacco, or drug use.   Review of Systems: Constitutional: Weight is stable.  Eyes: No changes in vision. ENT: No oral lesions, sore throat.  GI:  see HPI.  Heme/Lymph: No easy bruising.  CV: No chest pain.  GU: No hematuria.  Integumentary: No rashes.  Neuro: No headaches.  Psych: No depression/anxiety.  Endocrine: No heat/cold intolerance.  Allergic/Immunologic: No urticaria.  Resp: No cough, SOB.  Musculoskeletal: No joint swelling.    Physical Examination: BP 98/64 mmHg  Pulse 58  Temp(Src) 98 F (36.7 C) (Oral)  Resp 18  Ht 5\' 5"  (1.651 m)  Wt 62.687 kg (138 lb 3.2 oz)  BMI 23.00 kg/m2  SpO2 91% Gen: NAD, alert and oriented x 4 HEENT: PEERLA, EOMI, Neck: supple, no JVD or thyromegaly Chest: CTA bilaterally, no wheezes, crackles, or other adventitious sounds CV: RRR, no m/g/c/r Abd: soft, diffuse tenderness to light palpation, moderately distended, +sporadic, soft BS in all four quadrants, +tympany; no HSM, guarding, ridigity, or rebound tenderness Ext: no edema, well perfused with 2+ pulses, Skin: no rash or lesions noted Lymph: no LAD  Data: Lab Results  Component Value Date   WBC 9.7 02/11/2015   HGB 9.6* 02/12/2015   HCT 28.5* 02/11/2015   MCV 86.7 02/11/2015   PLT 296 02/11/2015    Recent Labs Lab 02/10/15 1431 02/11/15 0549 02/12/15 0613  HGB 11.0* 9.5* 9.6*   Lab Results  Component Value Date   NA 135 02/11/2015   K 4.4 02/11/2015   CL 103 02/11/2015   CO2 23 02/11/2015   BUN 22* 02/11/2015   CREATININE 1.07* 02/12/2015   Lab Results  Component Value Date   ALT 19 02/10/2015   AST 41 02/10/2015   ALKPHOS 84 02/10/2015   BILITOT 0.6 02/10/2015    Recent Labs Lab 02/10/15 1635  APTT 29  INR 1.05   Assessment/Plan: Chelsea [name] is a 81 y.o. female with a h/o multiple chronic medical conditions admitted for pneumonia, cough, and constipation.  GI was consulted for abdominal pain.  Though exam revealed distention and tympany suggestive of air in the abdomen, labs, [place], and CT scan were negative for obstruction or ileus.  It is also reassuring that she is only tender to palpation and  otherwise does not c/o abdominal pain.  A large component of her symptoms seems to be chronic constipation.  Patient has tried Miralax and other OTCs to relieve constipation, but has not seemed to stick to a regimen.  I recommend Miralax 17g daily, titrate as needed to soft stools.  She may also benefit from a fleets enema.  We will follow her Hgb which dropped from 11 to 9.5, but there is no evidence of a GI bleed.  Recommendations: 1. Abdominal pain - Miralax 17g as needed, titrate as needed to soft stools (max 34g BID) - Fleets enema ordered if Miralax unhelpful - Will follow Hgb, transfuse < 7   Thank you for the consult. We will follow along with you. Please call with questions or concerns.  Lavera Guise, PA-C [facility] Phone: [phone] Pager: [phone]

## 2015-02-13 LAB — CBC
HEMATOCRIT: 28.7 % — AB (ref 35.0–47.0)
HEMOGLOBIN: 9.3 g/dL — AB (ref 12.0–16.0)
MCH: 28.2 pg (ref 26.0–34.0)
MCHC: 32.5 g/dL (ref 32.0–36.0)
MCV: 87 fL (ref 80.0–100.0)
Platelets: 367 10*3/uL (ref 150–440)
RBC: 3.3 MIL/uL — AB (ref 3.80–5.20)
RDW: 18.1 % — ABNORMAL HIGH (ref 11.5–14.5)
WBC: 10.1 10*3/uL (ref 3.6–11.0)

## 2015-02-13 LAB — STOOL CULTURE: SPECIAL REQUESTS: NORMAL

## 2015-02-13 NOTE — Progress Notes (Signed)
GI Note:  Feels better after having stool last night.    - cont miralax 17 gr daily - if looser stool causes incontinence, would recommend GI physical therapy for pelvic floor therapy as outpatient.

## 2015-02-13 NOTE — Progress Notes (Signed)
[place] [facility] [place] NAME: Chelsea [name]    MR#:  [id number]  DATE OF BIRTH:  1924/03/01  SUBJECTIVE:  CHIEF COMPLAINT:   Chief Complaint  Patient presents with  . Cough  . Constipation  . Pneumonia   the patient is a 81 year old female with history of coronary artery disease status post coronary artery bypass grafting, bilateral hernia, gastroesophageal reflux disease, stroke, COPD who presents to the hospital with complaints of cough, fevers and chills as well as abdominal pain. Patient is self for cough and phlegm production of grayish color sputum, denies any significant shortness of breath. Afebrile today, however, remains on oxygen at 2 L of oxygen through nasal cannula and her O2 sats ranging between 91-94%. Chest x-ray and CT scan of abdomen and pelvis done in the emergency room revealed left lower lobe more than the right lower lobe  Pneumonia. Patient's diarrheal stool has subsided , but abdominal pain is still there and patient is not willing to advance to clear liquid diet to full yet, although she requested it to be advanced later in the day     Review of Systems  Constitutional: Negative for fever, chills and weight loss.  HENT: Negative for congestion.   Eyes: Negative for blurred vision and double vision.  Respiratory: Positive for cough, sputum production, shortness of breath and wheezing.   Cardiovascular: Negative for chest pain, palpitations, orthopnea, leg swelling and PND.  Gastrointestinal: Positive for nausea, vomiting, abdominal pain and diarrhea. Negative for constipation and blood in stool.  Genitourinary: Positive for dysuria. Negative for urgency, frequency and hematuria.  Musculoskeletal: Negative for falls.  Neurological: Negative for dizziness, tremors, focal weakness and headaches.  Endo/Heme/Allergies: Does not bruise/bleed easily.  Psychiatric/Behavioral: Negative for depression. The patient does not  have insomnia.     VITAL SIGNS: Blood pressure 121/48, pulse 60, temperature 98.3 F (36.8 C), temperature source Oral, resp. rate 20, height 5\' 5"  (1.651 m), weight 62.687 kg (138 lb 3.2 oz), SpO2 92 %.  PHYSICAL EXAMINATION:   GENERAL:  81 y.o.-year-old patient lying in the bed with no acute distress.  EYES: Pupils equal, round, reactive to light and accommodation. No scleral icterus. Extraocular muscles intact.  HEENT: Head atraumatic, normocephalic. Oropharynx and nasopharynx clear.  NECK:  Supple, no jugular venous distention. No thyroid enlargement, no tenderness.  LUNGS: Diminished breath sounds bilaterally, crackles at bases, especially on the left anteriorly as well as posteriorly. No use of accessory muscles of respiration.  CARDIOVASCULAR: S1, S2 normal. No murmurs, rubs, or gallops.  ABDOMEN: Soft, diffusely and some voluntary guarding in the upper abdomen, no rebound , nondistended. Bowel sounds present. No organomegaly or mass.  EXTREMITIES: No pedal edema, cyanosis, or clubbing.  NEUROLOGIC: Cranial nerves II through XII are intact. Muscle strength 5/5 in all extremities. Sensation intact. Gait not checked.  PSYCHIATRIC: The patient is alert and oriented x 3.  SKIN: No obvious rash, lesion, or ulcer.   ORDERS/RESULTS REVIEWED:   CBC  Recent Labs Lab 02/09/15 1608 02/10/15 1431 02/11/15 0549 02/12/15 0613 02/13/15 0456  WBC 11.1* 10.5 9.7  --  10.1  HGB  --  11.0* 9.5* 9.6* 9.3*  HCT 36.8 33.2* 28.5*  --  28.7*  PLT  --  331 296  --  367  MCV  --  86.7 86.7  --  87.0  MCH 28.0 28.7 29.1  --  28.2  MCHC 31.0* 33.1 33.5  --  32.5  RDW 17.0*  18.2* 18.3*  --  18.1*  LYMPHSABS 2.1  --   --   --   --   BASOSABS 0.0  --   --   --   --    ------------------------------------------------------------------------------------------------------------------  Chemistries   Recent Labs Lab 02/09/15 1608 02/10/15 1431 02/11/15 0549 02/12/15 0613  NA 140 134* 135  --    K 7.9* 4.5 4.4  --   CL 97 99* 103  --   CO2 22 24 23   --   GLUCOSE 126* 107* 104*  --   BUN 29* 27* 22*  --   CREATININE 1.38* 1.33* 1.08* 1.07*  CALCIUM 9.5 8.4* 7.9*  --   AST 22 41  --   --   ALT 14 19  --   --   ALKPHOS 98 84  --   --   BILITOT 0.6 0.6  --   --    ------------------------------------------------------------------------------------------------------------------ estimated creatinine clearance is 32.1 mL/min (by C-G formula based on Cr of 1.07). ------------------------------------------------------------------------------------------------------------------ No results for input(s): TSH, T4TOTAL, T3FREE, THYROIDAB in the last 72 hours.  Invalid input(s): FREET3  Cardiac Enzymes  Recent Labs Lab 02/10/15 2328 02/11/15 0549 02/11/15 0944  CKMB 4.3 6.0* 5.4*  TROPONINI 0.03 0.04* <0.03   ------------------------------------------------------------------------------------------------------------------ Invalid input(s): POCBNP ---------------------------------------------------------------------------------------------------------------  RADIOLOGY: No results found.  EKG:  Orders placed or performed during the hospital encounter of 02/10/15  . ED EKG  . ED EKG  . EKG 12-Lead  . EKG 12-Lead    ASSESSMENT AND PLAN:  Active Problems:   Pneumonia   Elevated troponin   Hypoxia   SOB (shortness of breath)   Weakness   Viral URI 1. Bilateral lower lobe pneumonia, questionable aspiration pneumonitis, continue patient on broad-spectrum antibiotic therapy with Rocephin and Zithromax, unable to obtain sputum cultures, blood cultures sent negative so far. No recommendations to change diet consistency by speech therapist 2.  Diarrhea, stool cultures are negative for pathogenic flora, C. difficile toxin was negative, diarrhea has subsided , 3. Diffuse abdominal pain, idue to constipation;resolved pain  ,continue stool softners advance to regular diet, 4.  Pyuria. Suspected UTI, urine culture is not reported. Continue Rocephin, 5. Generalized weakness. Get physical therapist involved, patient will be going [facility] facility [facility] she came from, per care management 6. Renal insufficiency, stable with IV fluids, continue to follow 7. Elevated troponin. Likely demand ischemia , appreciate cardiologist input, echocardiogram is completed,  . Most recent from 2010 showed normal ejection fraction, continue patient on aspirin, metoprolol as well as nitroglycerin provided her blood pressure tolerates. Appreciate cardiology input. No further testing at this time unless echocardiogram is abnormal   Management plans discussed with the patient, family and they are in agreement.   DRUG ALLERGIES:  Allergies  Allergen Reactions  . Sulfonamide Derivatives Other (See Comments)    Reaction:  Dizziness     CODE STATUS:     Code Status Orders        Start     Ordered   02/10/15 2146  Do not attempt resuscitation (DNR)   Continuous    Question Answer Comment  In the event of cardiac or respiratory ARREST Do not call a "code blue"   In the event of cardiac or respiratory ARREST Do not perform Intubation, CPR, defibrillation or ACLS   In the event of cardiac or respiratory ARREST Use medication by any route, position, wound care, and other measures to relive pain and suffering. May use oxygen, suction and  manual treatment of airway obstruction as needed for comfort.      02/10/15 2145    Advance Directive Documentation        Most Recent Value   Type of Advance Directive  Healthcare Power of Attorney, Living will   Pre-existing out of facility DNR order (yellow form or pink MOST form)     "MOST" Form in Place?        TOTAL TIME TAKING CARE OF THIS PATIENT: 35 minutes.  Discussed with cardiology and care Management  Jasin Brazel M.D on 02/13/2015 at 12:14 PM  Between 7am to 6pm - Pager - [phone]  After 6pm go to [email or web address] -  password [email or web address] [facility]  [facility]  Office  [phone]  CC: Primary care physician; Margarita Rana, MD

## 2015-02-13 NOTE — Care Management (Signed)
Paged attending reagrding documented concern for aspiration pneumonitis and if slp consult is indicated

## 2015-02-13 NOTE — Progress Notes (Signed)
    SUBJECTIVE:  No pain.  No SOB   PHYSICAL EXAM Filed Vitals:   02/13/15 0556 02/13/15 0858 02/13/15 0941 02/13/15 1145  BP: 106/44  123/47 121/48  Pulse: 64  64 60  Temp: 98.1 F (36.7 C)   98.3 F (36.8 C)  TempSrc:    Oral  Resp: 20   20  Height:      Weight:      SpO2: 89% 92%  92%   General:  No distress Lungs:  Scattered coarse crackles Heart:  RRR Abdomen:  Positive bowel sounds, no rebound no guarding Extremities:  No edema   LABS: Lab Results  Component Value Date   TROPONINI <0.03 02/11/2015   Results for orders placed or performed during the hospital encounter of 02/10/15 (from the past 24 hour(s))  Lactic acid, plasma     Status: None   Collection Time: 02/12/15  3:25 PM  Result Value Ref Range   Lactic Acid, Venous 1.0 0.5 - 2.0 mmol/L  Lactic acid, plasma     Status: None   Collection Time: 02/12/15  5:23 PM  Result Value Ref Range   Lactic Acid, Venous 0.8 0.5 - 2.0 mmol/L  CBC     Status: Abnormal   Collection Time: 02/13/15  4:56 AM  Result Value Ref Range   WBC 10.1 3.6 - 11.0 K/uL   RBC 3.30 (L) 3.80 - 5.20 MIL/uL   Hemoglobin 9.3 (L) 12.0 - 16.0 g/dL   HCT 28.7 (L) 35.0 - 47.0 %   MCV 87.0 80.0 - 100.0 fL   MCH 28.2 26.0 - 34.0 pg   MCHC 32.5 32.0 - 36.0 g/dL   RDW 18.1 (H) 11.5 - 14.5 %   Platelets 367 150 - 440 K/uL    Intake/Output Summary (Last 24 hours) at 02/13/15 1422 Last data filed at 02/13/15 0830  Gross per 24 hour  Intake   1380 ml  Output     50 ml  Net   1330 ml    ECHO  Study Conclusions  - Left ventricle: The cavity size was normal. Systolic function was normal. The estimated ejection fraction was in the range of 60% to 65%. Wall motion was normal; there were no regional wall motion abnormalities. Left ventricular diastolic function parameters were normal. - Left atrium: The atrium was normal in size. - Right ventricle: Systolic function was normal. - Pulmonary arteries: Systolic pressure was within  the normal range.  ASSESSMENT AND PLAN:  ELEVATED TROPONIN:   Minimal elevation in one reading only.  Echo with preserved EF.  No further cardiac work up.  Please call with further questions.   Chelsea [name] 02/13/2015 2:22 PM

## 2015-02-13 NOTE — Progress Notes (Signed)
Per MD Vianne Bulls, insert indwelling foley for acute urinary retention

## 2015-02-14 ENCOUNTER — Inpatient Hospital Stay: Payer: Medicare Other

## 2015-02-14 MED ORDER — VANCOMYCIN HCL IN DEXTROSE 750-5 MG/150ML-% IV SOLN
750.0000 mg | INTRAVENOUS | Status: DC
  Administered 2015-02-15 – 2015-02-16 (×2): 750 mg via INTRAVENOUS
  Filled 2015-02-14 (×3): qty 150

## 2015-02-14 MED ORDER — VANCOMYCIN HCL IN DEXTROSE 1-5 GM/200ML-% IV SOLN
1000.0000 mg | INTRAVENOUS | Status: DC
  Administered 2015-02-14: 1000 mg via INTRAVENOUS
  Filled 2015-02-14: qty 200

## 2015-02-14 MED ORDER — PIPERACILLIN-TAZOBACTAM 3.375 G IVPB
3.3750 g | Freq: Four times a day (QID) | INTRAVENOUS | Status: DC
  Filled 2015-02-14 (×4): qty 50

## 2015-02-14 MED ORDER — PIPERACILLIN-TAZOBACTAM 4.5 G IVPB
4.5000 g | Freq: Three times a day (TID) | INTRAVENOUS | Status: DC
  Administered 2015-02-14 – 2015-02-16 (×6): 4.5 g via INTRAVENOUS
  Filled 2015-02-14 (×10): qty 100

## 2015-02-14 NOTE — Progress Notes (Signed)
MD Vianne Bulls informed of pt's chest x-ray results, orders received

## 2015-02-14 NOTE — Progress Notes (Signed)
Per MD Vianne Bulls, order chest x, NPO, ans SLP eval, per this RN pt appears to be aspirating

## 2015-02-14 NOTE — Progress Notes (Addendum)
ANTIBIOTIC CONSULT NOTE - INITIAL  Pharmacy Consult for Vancomycin/Zosyn Indication: pneumonia  Allergies  Allergen Reactions  . Sulfonamide Derivatives Other (See Comments)    Reaction:  Dizziness     Patient Measurements: Height: 5\' 5"  (165.1 cm) Weight: 138 lb 3.2 oz (62.687 kg) IBW/kg (Calculated) : 57 Adjusted Body Weight: 59.3 kg  Vital Signs: Temp: 97.8 F (36.6 C) (08/28 1252) Temp Source: Oral (08/28 1252) BP: 123/44 mmHg (08/28 1252) Pulse Rate: 59 (08/28 1252) Intake/Output from previous day: 08/27 0701 - 08/28 0700 In: 817 [P.O.:240; I.V.:567] Out: 1400 [Urine:1400] Intake/Output from this shift:    Labs:  Recent Labs  02/12/15 0613 02/13/15 0456  WBC  --  10.1  HGB 9.6* 9.3*  PLT  --  367  CREATININE 1.07*  --    Estimated Creatinine Clearance: 32.1 mL/min (by C-G formula based on Cr of 1.07). No results for input(s): VANCOTROUGH, VANCOPEAK, VANCORANDOM, GENTTROUGH, GENTPEAK, GENTRANDOM, TOBRATROUGH, TOBRAPEAK, TOBRARND, AMIKACINPEAK, AMIKACINTROU, AMIKACIN in the last 72 hours.   Microbiology: Recent Results (from the past 720 hour(s))  Culture, blood (routine x 2)     Status: None (Preliminary result)   Collection Time: 02/10/15  4:35 PM  Result Value Ref Range Status   Specimen Description BLOOD LEFT ARM  Final   Special Requests BOTTLES DRAWN AEROBIC AND ANAEROBIC 2CC  Final   Culture NO GROWTH 3 DAYS  Final   Report Status PENDING  Incomplete  Culture, blood (routine x 2)     Status: None (Preliminary result)   Collection Time: 02/10/15  4:35 PM  Result Value Ref Range Status   Specimen Description BLOOD RIGHT WRIST  Final   Special Requests BOTTLES DRAWN AEROBIC AND ANAEROBIC 4CC  Final   Culture NO GROWTH 3 DAYS  Final   Report Status PENDING  Incomplete  MRSA PCR Screening     Status: None   Collection Time: 02/11/15 12:58 AM  Result Value Ref Range Status   MRSA by PCR NEGATIVE NEGATIVE Final    Comment:        The GeneXpert MRSA  Assay (FDA approved for NASAL specimens only), is one component of a comprehensive MRSA colonization surveillance program. It is not intended to diagnose MRSA infection nor to guide or monitor treatment for MRSA infections.   Stool culture     Status: None   Collection Time: 02/11/15 12:30 PM  Result Value Ref Range Status   Specimen Description STOOL  Final   Special Requests Normal  Final   Culture   Final    NO SALMONELLA OR SHIGELLA ISOLATED NO CAMPYLOBACTER DETECTED No Pathogenic E. coli detected    Report Status 02/13/2015 FINAL  Final  C difficile quick scan w PCR reflex     Status: None   Collection Time: 02/11/15 12:30 PM  Result Value Ref Range Status   C Diff antigen NEGATIVE NEGATIVE Final   C Diff toxin NEGATIVE NEGATIVE Final   C Diff interpretation Negative for C. difficile  Final  Urine culture     Status: None (Preliminary result)   Collection Time: 02/13/15  4:30 PM  Result Value Ref Range Status   Specimen Description URINE, CATHETERIZED  Final   Special Requests Normal  Final   Culture NO GROWTH < 24 HOURS  Final   Report Status PENDING  Incomplete    Medical History: Past Medical History  Diagnosis Date  . Coronary artery disease     a. s/p 2 vessel CABG 1989; b. echo 2003:  nl; c. cath 2006: patent grafts, EF 65%; d. nuclear stress test 2007: no ischemia, EF 81%  . Hyperlipidemia   . Hypertension   . Bradycardia     hx of it and fatigue with beta blockade  . History of hyperkalemia   . Carotid artery disease     s/p carotid endarterectomy  . Stroke 1980s    left brain  . Mitral valve prolapse   . Osteoporosis   . Asthma   . Degenerative disc disease   . Spinal stenosis in cervical region   . Norovirus 2014    Medications:  Prescriptions prior to admission  Medication Sig Dispense Refill Last Dose  . albuterol (PROVENTIL HFA;VENTOLIN HFA) 108 (90 BASE) MCG/ACT inhaler Inhale 2 puffs into the lungs every 6 (six) hours as needed for  wheezing or shortness of breath.    PRN at PRN  . alendronate (FOSAMAX) 70 MG tablet Take 70 mg by mouth once a week.    unknown at unknown  . amLODipine (NORVASC) 10 MG tablet Take 10 mg by mouth daily.   unknown at unknown  . aspirin EC 81 MG tablet Take 81 mg by mouth daily.   unknown at unknown  . atorvastatin (LIPITOR) 80 MG tablet Take 1 tablet (80 mg total) by mouth daily. 90 tablet 4 unknown at unknown  . beclomethasone (BECONASE-AQ) 42 MCG/SPRAY nasal spray Place 1 spray into both nostrils 2 (two) times daily.    unknown at unknown  . calcium citrate-vitamin D (CITRACAL+D) 315-200 MG-UNIT per tablet Take 1 tablet by mouth daily.   unknown at unknown  . diazepam (VALIUM) 5 MG tablet Take 5 mg by mouth every 6 (six) hours as needed for anxiety.    PRN at PRN  . fluticasone (FLONASE) 50 MCG/ACT nasal spray Place 2 sprays into both nostrils daily.   unknown at unknown  . Fluticasone Furoate-Vilanterol (BREO ELLIPTA) 100-25 MCG/INH AEPB Inhale 1 puff into the lungs daily.   unknown at unknown  . lansoprazole (PREVACID) 30 MG capsule Take 30 mg by mouth 2 (two) times daily.   unknown at unknown  . levofloxacin (LEVAQUIN) 500 MG tablet Take 1 tablet (500 mg total) by mouth daily. 7 tablet 0 unknown at unknown  . levothyroxine (SYNTHROID, LEVOTHROID) 88 MCG tablet Take 88 mcg by mouth daily before breakfast.   unknown at unknown  . LORazepam (ATIVAN) 0.5 MG tablet Take 0.5 mg by mouth daily as needed for anxiety.    PRN at PRN  . metaxalone (SKELAXIN) 800 MG tablet Take 800 mg by mouth 3 (three) times daily as needed for muscle spasms.    PRN at PRN  . metroNIDAZOLE (FLAGYL) 500 MG tablet Take 1 tablet (500 mg total) by mouth 3 (three) times daily. 21 tablet 0 unknown at unknown  . montelukast (SINGULAIR) 10 MG tablet Take 10 mg by mouth at bedtime.   unknown at unknown  . Multiple Vitamins-Minerals (PRESERVISION AREDS PO) Take 1 capsule by mouth daily.    unknown at unknown  . nitroGLYCERIN  (NITROSTAT) 0.4 MG SL tablet Place 1 tablet (0.4 mg total) under the tongue every 5 (five) minutes as needed. (Patient taking differently: Place 0.4 mg under the tongue every 5 (five) minutes as needed for chest pain. ) 25 tablet 6 PRN at PRN  . oxybutynin (DITROPAN) 5 MG tablet Take 5 mg by mouth at bedtime.   unknown at unknown  . oxyCODONE-acetaminophen (PERCOCET) 10-325 MG per tablet Take 1 tablet by mouth  every 6 (six) hours as needed for pain. 30 tablet 0 PRN at PRN  . senna (SENOKOT) 8.6 MG TABS tablet Take 1 tablet by mouth as needed for mild constipation.   PRN at PRN  . sertraline (ZOLOFT) 100 MG tablet Take 1.5 tablets (150 mg total) by mouth daily. 45 tablet 3 unknown at unknown  . predniSONE (DELTASONE) 10 MG tablet 6 po for 1 day and then 5 po for 1 day and then 4 po for 1 day and 3 po for 1 day and then 2 po for 1 day and then 1 po for 1 day. (Patient not taking: Reported on 02/10/2015) 21 tablet 0 Taking   Assessment: CrCl = 32.1 ml/min Ke = 0.03 hr-1 T1/2 = 23.1 hrs Vd = 44 L  Pseudomonas risk factors:  Prior use of Levaquin  Goal of Therapy:  Vancomycin trough level 15-20 mcg/ml  Plan:  Zosyn 4.5 gm IV Q8H EI ordered to start 8/28.  Vancomycin 1 gm IV X 1 given on 8/28 @ 16:00. Vancomycin 750 mg IV Q24H ordered to start 8/29 @ 7:00, 15 hrs after 1st dose. This pt will reach Css by 9/2 @ 7:00. Will draw 1st trough on 9/1 @ 6:30.   Emmanuella Mirante D 02/14/2015,3:53 PM

## 2015-02-14 NOTE — Progress Notes (Signed)
[place] at [place] NAME: Chelsea [name]    MR#:  [id number]  DATE OF BIRTH:  10-25-1923  SUBJECTIVE:  CHIEF COMPLAINT:   Chief Complaint  Patient presents with  . Cough  . Constipation  . Pneumonia   the patient is a 81 year old female with history of coronary artery disease status post coronary artery bypass grafting, bilateral hernia, gastroesophageal reflux disease, stroke, COPD who presents to the hospital with complaints of cough, fevers and chills as well as abdominal pain. Admitted for bilateral pneumonia. Has some cough and wheezing. Noted to have urinary retention yesterday, patient received Foley because of the urinary retention. Complains of right-sided chest wall pain. No nausea, no vomiting.RN  noticed lots of cough when she tries to swallow.   Review of Systems  Constitutional: Negative for fever, chills and weight loss.  HENT: Negative for congestion.   Eyes: Negative for blurred vision and double vision.  Respiratory: Positive for cough, sputum production, shortness of breath and wheezing.   Cardiovascular: Negative for chest pain, palpitations, orthopnea, leg swelling and PND.       Right side chest wall pain  Gastrointestinal: Negative for nausea, vomiting, abdominal pain, diarrhea, constipation and blood in stool.  Genitourinary: Negative for dysuria, urgency, frequency and hematuria.       Urine retension  Musculoskeletal: Negative for falls.  Neurological: Negative for dizziness, tremors, focal weakness and headaches.  Endo/Heme/Allergies: Does not bruise/bleed easily.  Psychiatric/Behavioral: Negative for depression. The patient does not have insomnia.     VITAL SIGNS: Blood pressure 131/43, pulse 63, temperature 98.2 F (36.8 C), temperature source Oral, resp. rate 17, height 5\' 5"  (1.651 m), weight 62.687 kg (138 lb 3.2 oz), SpO2 92 %.  PHYSICAL EXAMINATION:   GENERAL:  81 y.o.-year-old patient lying in  the bed with no acute distress.  EYES: Pupils equal, round, reactive to light and accommodation. No scleral icterus. Extraocular muscles intact.  HEENT: Head atraumatic, normocephalic. Oropharynx and nasopharynx clear.  NECK:  Supple, no jugular venous distention. No thyroid enlargement, no tenderness.  LUNGS: Diminished breath sounds bilaterally, crackles at bases, especially on the left anteriorly as well as posteriorly. No use of accessory muscles of respiration.  CARDIOVASCULAR: S1, S2 normal. No murmurs, rubs, or gallops.  ABDOMEN: Soft, diffusely and some voluntary guarding in the upper abdomen, no rebound , nondistended. Bowel sounds present. No organomegaly or mass.  EXTREMITIES: No pedal edema, cyanosis, or clubbing.  NEUROLOGIC: Cranial nerves II through XII are intact. Muscle strength 5/5 in all extremities. Sensation intact. Gait not checked.  PSYCHIATRIC: The patient is alert and oriented x 3.  SKIN: No obvious rash, lesion, or ulcer.   ORDERS/RESULTS REVIEWED:   CBC  Recent Labs Lab 02/09/15 1608 02/10/15 1431 02/11/15 0549 02/12/15 0613 02/13/15 0456  WBC 11.1* 10.5 9.7  --  10.1  HGB  --  11.0* 9.5* 9.6* 9.3*  HCT 36.8 33.2* 28.5*  --  28.7*  PLT  --  331 296  --  367  MCV  --  86.7 86.7  --  87.0  MCH 28.0 28.7 29.1  --  28.2  MCHC 31.0* 33.1 33.5  --  32.5  RDW 17.0* 18.2* 18.3*  --  18.1*  LYMPHSABS 2.1  --   --   --   --   BASOSABS 0.0  --   --   --   --    ------------------------------------------------------------------------------------------------------------------  Chemistries   Recent Labs Lab 02/09/15  1608 02/10/15 1431 02/11/15 0549 02/12/15 0613  NA 140 134* 135  --   K 7.9* 4.5 4.4  --   CL 97 99* 103  --   CO2 22 24 23   --   GLUCOSE 126* 107* 104*  --   BUN 29* 27* 22*  --   CREATININE 1.38* 1.33* 1.08* 1.07*  CALCIUM 9.5 8.4* 7.9*  --   AST 22 41  --   --   ALT 14 19  --   --   ALKPHOS 98 84  --   --   BILITOT 0.6 0.6  --   --     ------------------------------------------------------------------------------------------------------------------ estimated creatinine clearance is 32.1 mL/min (by C-G formula based on Cr of 1.07). ------------------------------------------------------------------------------------------------------------------ No results for input(s): TSH, T4TOTAL, T3FREE, THYROIDAB in the last 72 hours.  Invalid input(s): FREET3  Cardiac Enzymes  Recent Labs Lab 02/10/15 2328 02/11/15 0549 02/11/15 0944  CKMB 4.3 6.0* 5.4*  TROPONINI 0.03 0.04* <0.03   ------------------------------------------------------------------------------------------------------------------ Invalid input(s): POCBNP ---------------------------------------------------------------------------------------------------------------  RADIOLOGY: No results found.  EKG:  Orders placed or performed during the hospital encounter of 02/10/15  . ED EKG  . ED EKG  . EKG 12-Lead  . EKG 12-Lead    ASSESSMENT AND PLAN:  Active Problems:   Pneumonia   Elevated troponin   Hypoxia   SOB (shortness of breath)   Weakness   Viral URI 1. Bilateral lower lobe pneumoniawith possible  aspiration pneumonitis, continue patient on broad-spectrum antibiotic therapy with Rocephin and Zithromax, unable to obtain sputum cultures, blood cultures sent negative so far.reeval by speech therapy.had   Cough and choking episode;rpt chest xray,keep NPO,add duoneb for wheezing 2.  Diarrhea, stool cultures are negative for pathogenic flora, C. difficile toxin was negative, diarrhea has subsided , 3. Diffuse abdominal pain, idue to constipation;resolved pain  ,continue stool softners  4. Pyuria. Suspected UTI, urine culture is not reported. Continue Rocephin, 5. Generalized weakness. Get physical therapist involved, patient will be going back to [facility] facility where she came from, per care management 6. Renal insufficiency, stable with IV fluids, d/c  iv fluids 7. Elevated troponin. Likely demand ischemia , appreciate cardiologist input, echocardiogram is completed,  With normal EF,no further cardiac work up. 8.right side pleuritic chest pain;chest xray now,continue  Pain meds DNR  DRUG ALLERGIES:  Allergies  Allergen Reactions  . Sulfonamide Derivatives Other (See Comments)    Reaction:  Dizziness     CODE STATUS:     Code Status Orders        Start     Ordered   02/10/15 2146  Do not attempt resuscitation (DNR)   Continuous    Question Answer Comment  In the event of cardiac or respiratory ARREST Do not call a "code blue"   In the event of cardiac or respiratory ARREST Do not perform Intubation, CPR, defibrillation or ACLS   In the event of cardiac or respiratory ARREST Use medication by any route, position, wound care, and other measures to relive pain and suffering. May use oxygen, suction and manual treatment of airway obstruction as needed for comfort.      02/10/15 2145    Advance Directive Documentation        Most Recent Value   Type of Advance Directive  Healthcare Power of Attorney, Living will   Pre-existing out of facility DNR order (yellow form or pink MOST form)     "MOST" Form in Place?        TOTAL TIME  TAKING CARE OF THIS PATIENT: 35 minutes.    Epifanio Lesches M.D on 02/14/2015 at 12:00 PM  Between 7am to 6pm - Pager - [phone]  After 6pm go to [email or web address] - password [email or web address] [facility]  [facility]  Office  [phone]  CC: Primary care physician; Margarita Rana, MD

## 2015-02-15 LAB — CBC
HEMATOCRIT: 28.7 % — AB (ref 35.0–47.0)
Hemoglobin: 9.4 g/dL — ABNORMAL LOW (ref 12.0–16.0)
MCH: 28.2 pg (ref 26.0–34.0)
MCHC: 32.7 g/dL (ref 32.0–36.0)
MCV: 86.3 fL (ref 80.0–100.0)
PLATELETS: 403 10*3/uL (ref 150–440)
RBC: 3.32 MIL/uL — AB (ref 3.80–5.20)
RDW: 17.9 % — AB (ref 11.5–14.5)
WBC: 9.9 10*3/uL (ref 3.6–11.0)

## 2015-02-15 LAB — CULTURE, BLOOD (ROUTINE X 2)
Culture: NO GROWTH
Culture: NO GROWTH

## 2015-02-15 LAB — BASIC METABOLIC PANEL
ANION GAP: 7 (ref 5–15)
BUN: 10 mg/dL (ref 6–20)
CALCIUM: 7.7 mg/dL — AB (ref 8.9–10.3)
CO2: 24 mmol/L (ref 22–32)
Chloride: 102 mmol/L (ref 101–111)
Creatinine, Ser: 0.92 mg/dL (ref 0.44–1.00)
GFR, EST NON AFRICAN AMERICAN: 54 mL/min — AB (ref >60)
Glucose, Bld: 108 mg/dL — ABNORMAL HIGH (ref 65–99)
POTASSIUM: 4 mmol/L (ref 3.5–5.1)
Sodium: 133 mmol/L — ABNORMAL LOW (ref 135–145)

## 2015-02-15 MED ORDER — ENSURE ENLIVE PO LIQD
237.0000 mL | Freq: Two times a day (BID) | ORAL | Status: DC
  Administered 2015-02-16: 237 mL via ORAL
  Administered 2015-02-16: 150 mL via ORAL
  Administered 2015-02-17: 237 mL via ORAL

## 2015-02-15 NOTE — Progress Notes (Addendum)
Patients daughter, Anderson Malta, called RN this AM. Requested that rounding MD please give her a call with an update as well. Her phone number is in the chart and verified over phone. RN will inform MD.  Update @ 11:01 AM- Dr. Vianne Bulls notified and aware.

## 2015-02-15 NOTE — Progress Notes (Signed)
[place] at [place] NAME: Chelsea [name]    MR#:  [id number]  DATE OF BIRTH:  September 06, 1923  SUBJECTIVE:  CHIEF COMPLAINT:   Chief Complaint  Patient presents with  . Cough  . Constipation  . Pneumonia   the patient is a 81 year old female with history of coronary artery disease status post coronary artery bypass grafting, bilateral hernia, gastroesophageal reflux disease, stroke, COPD who presents to the hospital with complaints of cough, fevers and chills as well as abdominal pain. Admitted for bilateral pneumonia. Seen a, antibiotics were changed to vancomycin and Zosyn secondary to persistent pneumonia, pleuritic chest pain. And also possible aspiration. She feels better today now more that pleuritic chest pain on the right side. No nausea, no vomiting. Feels generalized weakness. Working with physical therapy at this time.  Review of Systems  Constitutional: Negative for fever, chills and weight loss.  HENT: Negative for congestion.   Eyes: Negative for blurred vision and double vision.  Respiratory: Negative for cough, sputum production, shortness of breath and wheezing.   Cardiovascular: Negative for chest pain, palpitations, orthopnea, leg swelling and PND.       Right side chest wall pain  Gastrointestinal: Negative for nausea, vomiting, abdominal pain, diarrhea, constipation and blood in stool.  Genitourinary: Negative for dysuria, urgency, frequency and hematuria.       Urine retension  Musculoskeletal: Negative for falls.  Neurological: Negative for dizziness, tremors, focal weakness and headaches.  Endo/Heme/Allergies: Does not bruise/bleed easily.  Psychiatric/Behavioral: Negative for depression. The patient does not have insomnia.     VITAL SIGNS: Blood pressure 146/43, pulse 62, temperature 98.2 F (36.8 C), temperature source Oral, resp. rate 20, height 5\' 5"  (1.651 m), weight 62.687 kg (138 lb 3.2 oz), SpO2 88  %.  PHYSICAL EXAMINATION:   GENERAL:  81 y.o.-year-old patient lying in the bed with no acute distress.  EYES: Pupils equal, round, reactive to light and accommodation. No scleral icterus. Extraocular muscles intact.  HEENT: Head atraumatic, normocephalic. Oropharynx and nasopharynx clear.  NECK:  Supple, no jugular venous distention. No thyroid enlargement, no tenderness.  LUNGS: Diminished breath sounds bilaterally, crackles at bases, especially on the left anteriorly as well as posteriorly. No use of accessory muscles of respiration.  CARDIOVASCULAR: S1, S2 normal. No murmurs, rubs, or gallops.  ABDOMEN: Soft, diffusely and some voluntary guarding in the upper abdomen, no rebound , nondistended. Bowel sounds present. No organomegaly or mass.  EXTREMITIES: No pedal edema, cyanosis, or clubbing.  NEUROLOGIC: Cranial nerves II through XII are intact. Muscle strength 5/5 in all extremities. Sensation intact. Gait not checked.  PSYCHIATRIC: The patient is alert and oriented x 3.  SKIN: No obvious rash, lesion, or ulcer.   ORDERS/RESULTS REVIEWED:   CBC  Recent Labs Lab 02/09/15 1608 02/10/15 1431 02/11/15 0549 02/12/15 0613 02/13/15 0456 02/15/15 0512  WBC 11.1* 10.5 9.7  --  10.1 9.9  HGB  --  11.0* 9.5* 9.6* 9.3* 9.4*  HCT 36.8 33.2* 28.5*  --  28.7* 28.7*  PLT  --  331 296  --  367 403  MCV  --  86.7 86.7  --  87.0 86.3  MCH 28.0 28.7 29.1  --  28.2 28.2  MCHC 31.0* 33.1 33.5  --  32.5 32.7  RDW 17.0* 18.2* 18.3*  --  18.1* 17.9*  LYMPHSABS 2.1  --   --   --   --   --   BASOSABS 0.0  --   --   --   --   --    ------------------------------------------------------------------------------------------------------------------  Chemistries   Recent Labs Lab 02/09/15 1608 02/10/15 1431 02/11/15 0549 02/12/15 0613 02/15/15 0512  NA 140 134* 135  --  133*  K 7.9* 4.5 4.4  --  4.0  CL 97 99* 103  --  102  CO2 22 24 23   --  24  GLUCOSE 126* 107* 104*  --  108*  BUN 29*  27* 22*  --  10  CREATININE 1.38* 1.33* 1.08* 1.07* 0.92  CALCIUM 9.5 8.4* 7.9*  --  7.7*  AST 22 41  --   --   --   ALT 14 19  --   --   --   ALKPHOS 98 84  --   --   --   BILITOT 0.6 0.6  --   --   --    ------------------------------------------------------------------------------------------------------------------ estimated creatinine clearance is 37.3 mL/min (by C-G formula based on Cr of 0.92). ------------------------------------------------------------------------------------------------------------------ No results for input(s): TSH, T4TOTAL, T3FREE, THYROIDAB in the last 72 hours.  Invalid input(s): FREET3  Cardiac Enzymes  Recent Labs Lab 02/10/15 2328 02/11/15 0549 02/11/15 0944  CKMB 4.3 6.0* 5.4*  TROPONINI 0.03 0.04* <0.03   ------------------------------------------------------------------------------------------------------------------ Invalid input(s): POCBNP ---------------------------------------------------------------------------------------------------------------  RADIOLOGY: Dg Chest 2 View  02/14/2015   CLINICAL DATA:  Aspiration.  Weakness.  EXAM: CHEST  2 VIEW  COMPARISON:  02/10/2015  FINDINGS: Lumbar compression deformities. Midline trachea. Mild cardiomegaly. Atherosclerosis in the transverse aorta. Small bilateral pleural effusions, new or increased. No pneumothorax. Mild pulmonary venous congestion. Similar left base airspace disease. Increased right base opacity. Right sided rib deformities with probable scarring in the right upper lobe laterally.  IMPRESSION: Cardiomegaly with development of mild pulmonary venous congestion.  Similar left base opacity, suspicious for pneumonia or aspiration.  New or increased bilateral pleural effusions.  Increased right base airspace disease.   Electronically Signed   By: Abigail Miyamoto M.D.   On: 02/14/2015 13:22    EKG:  Orders placed or performed during the hospital encounter of 02/10/15  . ED EKG  . ED EKG  .  EKG 12-Lead  . EKG 12-Lead    ASSESSMENT AND PLAN:  Active Problems:   Pneumonia   Elevated troponin   Hypoxia   SOB (shortness of breath)   Weakness   Viral URI 1. Bilateral lower lobe pneumoniawith possible  aspiration pneumonitis, continue patient on broad-spectrum antibiotic therapy with Comycin, Zosyn., unable to obtain sputum cultures, blood cultures sent negative so far.reeval by speech therapy.had   Cough and choking episode; 2.  Diarrhea, stool cultures are negative for pathogenic flora, C. difficile toxin was negative, diarrhea has subsided , 3. Diffuse abdominal pain, idue to constipation;resolved pain  ,continue stool softners  4. Pyuria. Suspected UTI, urine culture is not reported.  Zosyn. Urine retention likely due to UTI continue Foley. the plan is to do bladder training after her pneumonia but gets better so   she feels little stronger to use bedside, or. 5. Generalized weakness. Get physical therapist involved, patient will be going back to [facility] facility where she came from, per care management 6. Renal insufficiency, stable with IV fluids, d/c iv fluids 7. Elevated troponin. Likely demand ischemia , appreciate cardiologist input, echocardiogram is completed,  With normal EF,no further cardiac work up. 8.right side pleuritic chest pain; a candidate to bilateral pneumonia: Now she feels better with the new antibiotics.   Discussed with patient's daughter Anderson Malta over the phone.  Answered all the questions. She would like to have updated every day so  we will call her every day and give her update. DRUG ALLERGIES:  Allergies  Allergen Reactions  . Sulfonamide Derivatives Other (See Comments)    Reaction:  Dizziness     CODE STATUS:     Code Status Orders        Start     Ordered   02/10/15 2146  Do not attempt resuscitation (DNR)   Continuous    Question Answer Comment  In the event of cardiac or respiratory ARREST Do not call a "code blue"   In the event  of cardiac or respiratory ARREST Do not perform Intubation, CPR, defibrillation or ACLS   In the event of cardiac or respiratory ARREST Use medication by any route, position, wound care, and other measures to relive pain and suffering. May use oxygen, suction and manual treatment of airway obstruction as needed for comfort.      02/10/15 2145    Advance Directive Documentation        Most Recent Value   Type of Advance Directive  Healthcare Power of Attorney, Living will   Pre-existing out of facility DNR order (yellow form or pink MOST form)     "MOST" Form in Place?        TOTAL TIME TAKING CARE OF THIS PATIENT: 35 minutes.    Epifanio Lesches M.D on 02/15/2015 at 11:43 AM  Between 7am to 6pm - Pager - [phone]  After 6pm go to [email or web address] - password [email or web address]  [place] [facility]  Office  [phone]  CC: Primary care physician; Margarita Rana, MD

## 2015-02-15 NOTE — Progress Notes (Signed)
Initial Nutrition Assessment   INTERVENTION:   Meals and Snacks: Cater to patient preferences; SLP following for consistency recommendations Medical Food Supplement Therapy: will recommend Ensure Enlive po BID, each supplement provides 350 kcal and 20 grams of protein   NUTRITION DIAGNOSIS:   Swallowing difficulty related to dysphagia as evidenced by  (diet order, SLP evaluation).  GOAL:   Patient will meet greater than or equal to 90% of their needs  MONITOR:    (Energy Intake, Digestive system, Anthropometrics, Pulmonary Profile)  REASON FOR ASSESSMENT:    (Diet Order)    ASSESSMENT:   Pt admitted with pna, possibly aspiration pna per MD note and abdominal pain with constipation.  Past Medical History  Diagnosis Date  . Coronary artery disease     a. s/p 2 vessel CABG 1989; b. echo 2003: nl; c. cath 2006: patent grafts, EF 65%; d. nuclear stress test 2007: no ischemia, EF 81%  . Hyperlipidemia   . Hypertension   . Bradycardia     hx of it and fatigue with beta blockade  . History of hyperkalemia   . Carotid artery disease     s/p carotid endarterectomy  . Stroke 1980s    left brain  . Mitral valve prolapse   . Osteoporosis   . Asthma   . Degenerative disc disease   . Spinal stenosis in cervical region   . Norovirus 2014     Diet Order:  DIET - DYS 1 Room service appropriate?: Yes with Assist; Fluid consistency:: Thin SLP following   Current Nutrition: Pt had eaten 60-70% of lunch tray in room with 100% of ice cream. Recorded po intake 0% of breakfast this am. Limited documentation, 100% of breakfast on 8/25.  Food/Nutrition-Related History: Pt reports poor appetite for the past few weeks. Pt reports drinking chocolate Boost BID PTA. Pt unable to clarify usual intake on visit.   Medications: calcium-vitamin D, Protonix, Miralax  Electrolyte/Renal Profile and Glucose Profile:   Recent Labs Lab 02/10/15 1431 02/11/15 0549 02/12/15 0613 02/15/15 0512   NA 134* 135  --  133*  K 4.5 4.4  --  4.0  CL 99* 103  --  102  CO2 24 23  --  24  BUN 27* 22*  --  10  CREATININE 1.33* 1.08* 1.07* 0.92  CALCIUM 8.4* 7.9*  --  7.7*  GLUCOSE 107* 104*  --  108*   Protein Profile:  Recent Labs Lab 02/10/15 1431  ALBUMIN 3.1*    Gastrointestinal Profile: Last BM: 02/12/2015   Nutrition-Focused Physical Exam Findings: Nutrition-Focused physical exam completed. Findings are WDL for fat depletion, muscle depletion, and edema.    Weight Change: Pt reports not knowing weight PTA but thinks usually 145lbs. Per [facility] weight fluctuates.    Height:   Ht Readings from Last 1 Encounters:  02/10/15 5\' 5"  (1.651 m)    Weight:   Wt Readings from Last 1 Encounters:  02/10/15 138 lb 3.2 oz (62.687 kg)    Wt Readings from Last 10 Encounters:  02/10/15 138 lb 3.2 oz (62.687 kg)  01/15/15 142 lb (64.411 kg)  12/16/14 151 lb (68.493 kg)  12/08/14 135 lb (61.236 kg)  11/18/14 139 lb 12.8 oz (63.413 kg)  11/03/14 147 lb 12 oz (67.019 kg)  05/06/14 150 lb 8 oz (68.266 kg)  08/27/13 142 lb 8 oz (64.638 kg)  07/23/13 135 lb (61.236 kg)  03/31/13 130 lb (58.968 kg)     BMI:  Body mass index is 23  kg/(m^2).  Estimated Nutritional Needs:   Kcal:  1390-1643kcals, BEE: 1053kcals, TEE: (IF 1.1-1.3)(AF 1.2)   Protein:  50-63g protein (0.8-1.0g/kg)  Fluid:  1566-1865mL of fluid (25-58mL/kg)  EDUCATION NEEDS:   No education needs identified at this time   [place], RD, LDN Pager [phone]

## 2015-02-15 NOTE — Progress Notes (Signed)
B/P 133/45 lying, HR 61, patient easily arousalible with no acute distress noted. Will continue to monitor pt.

## 2015-02-15 NOTE — Evaluation (Signed)
Clinical/Bedside Swallow Evaluation Patient Details  Name: Chelsea [name] MRN: [id number] Date of Birth: 06/03/1924  Today's Date: 02/15/2015 Time: SLP Start Time (ACUTE ONLY): 34 SLP Stop Time (ACUTE ONLY): 1515 SLP Time Calculation (min) (ACUTE ONLY): 60 min  Past Medical History:  Past Medical History  Diagnosis Date  . Coronary artery disease     a. s/p 2 vessel CABG 1989; b. echo 2003: nl; c. cath 2006: patent grafts, EF 65%; d. nuclear stress test 2007: no ischemia, EF 81%  . Hyperlipidemia   . Hypertension   . Bradycardia     hx of it and fatigue with beta blockade  . History of hyperkalemia   . Carotid artery disease     s/p carotid endarterectomy  . Stroke 1980s    left brain  . Mitral valve prolapse   . Osteoporosis   . Asthma   . Degenerative disc disease   . Spinal stenosis in cervical region   . Norovirus 2014   Past Surgical History:  Past Surgical History  Procedure Laterality Date  . Coronary artery bypass graft    . Carotid endarterectomy      left  . Cholecystectomy    . Abdominal hysterectomy    . Bladder suspension    . Hip surgery  02/2011    right   HPI:  Pt is a 81 y.o. female with a known history of chronic constipation (takes Miralax), CVA 1980's, CAD status post CABG, hiatal hernia with GERD, COPD and asthma, degenerative disc disease and spinal stenosis with chronic back pain presents to the hospital secondary to worsening cough, fever and chills and abdominal pain. Pt was verbally conversive but w/ noted Cognitive decline - unsure of pt's baseline Cognitive status as it is not noted per chart notes. Pt was able to follow general instruction/commands w/ verbal cues and direction at times. She needed support during feeding of po trials. She was alert and that she was in the hospital.    Assessment / Plan / Recommendation Clinical Impression  Pt appears to present w/ reduced risk for aspiration at this time as she adequately tolerated trials  of thin liquids and purees w/ no overt s/s of aspiration noted. Clear vocal quality noted b/t trials of thin liquids via straw/cup. No noted oral phase deficits w/ these consistencies. Solids were not tested at this session but will f/u w/ trials. Pt does appear to have some Cognitive decline and would benefit from assistance and monitoring during meals d/t confusion. Rec. a pureed diet w/ thin liquids; aspiration precautions; meds in Puree; monitoring at meals.     Aspiration Risk   (reduced)    Diet Recommendation Dysphagia 1 (Puree);Thin   Medication Administration: Whole meds with puree (crushed if nec/able to) Compensations: Minimize environmental distractions;Slow rate;Small sips/bites    Other  Recommendations Recommended Consults:  (Dietician) Oral Care Recommendations: Oral care BID;Staff/trained caregiver to provide oral care   Follow Up Recommendations       Frequency and Duration min 3x week  1 week   Pertinent Vitals/Pain denied    SLP Swallow Goals  see care plan   Swallow Study Prior Functional Status   resides at [facility] independent living; is ambulatory w/ walker per report. Pt denied any trouble swallowing w/ her meals. Unsure of pt's baseline Cognitive status as she presented w/ confusion during this evaluation.     General Date of Onset: 02/10/15 Other Pertinent Information: Pt is a 81 y.o. female with a known  history of chronic constipation (takes Miralax), CVA 1980's, CAD status post CABG, hiatal hernia with GERD, COPD and asthma, degenerative disc disease and spinal stenosis with chronic back pain presents to the hospital secondary to worsening cough, fever and chills and abdominal pain. Pt was verbally conversive but w/ noted Cognitive decline - unsure of pt's baseline Cognitive status as it is not noted per chart notes. Pt was able to follow general instruction/commands w/ verbal cues and direction at times. She needed support during feeding of po trials. She  was alert and that she was in the hospital.  Type of Study: Bedside swallow evaluation Previous Swallow Assessment: none indicated Diet Prior to this Study:  (unknown but lives at [facility] independent side) Temperature Spikes Noted: No (wbc wnl) Respiratory Status: Supplemental O2 delivered via (comment) (3 liters) History of Recent Intubation: No Behavior/Cognition: Alert;Cooperative;Pleasant mood;Confused;Distractible;Requires cueing Oral Cavity - Dentition: Adequate natural dentition/normal for age Self-Feeding Abilities: Able to feed self;Needs assist;Needs set up Patient Positioning: Upright in bed Baseline Vocal Quality: Normal Volitional Cough:  (NT) Volitional Swallow: Able to elicit    Oral/Motor/Sensory Function Overall Oral Motor/Sensory Function: Appears within functional limits for tasks assessed Labial ROM: Within Functional Limits Labial Symmetry: Within Functional Limits Labial Strength: Within Functional Limits Lingual ROM: Within Functional Limits Lingual Symmetry: Within Functional Limits Lingual Strength: Within Functional Limits Facial Symmetry: Within Functional Limits Mandible: Within Functional Limits   Ice Chips Ice chips: Within functional limits Presentation: Spoon (fed; 3 trials)   Thin Liquid Thin Liquid: Within functional limits Presentation: Cup;Self Fed;Straw (assisted; 9-10 trials total)    Nectar Thick Nectar Thick Liquid: Not tested   Honey Thick Honey Thick Liquid: Not tested   Puree Puree: Within functional limits Presentation: Self Fed;Spoon (assisted; 1/3-1/2 of ice cream; finished 1/2 of lunch meal)   Solid   GO    Solid: Not tested      Orinda Kenner, MS, CCC-SLP  Watson,Katherine 02/15/2015,3:42 PM

## 2015-02-15 NOTE — Progress Notes (Signed)
Physical Therapy Treatment Patient Details Name: Chelsea [name] MRN: [id number] DOB: 04-Mar-1924 Today's Date: 02/15/2015    History of Present Illness Pt is an 81 yo female from [facility] admitted to the hospital for bilateral pneumonia     PT Comments    Pt making gradual progress towards goals with increased therex tolerance, however she continues to have general weakness that is grossly affecting her functional mobility. A friend at her bedside states that she has grown much weaker over the past 3 days. Pt is willing to participate in therapy and is very pleasant. She will continue to benefit from skilled PT in order for her to return to her premorbid state.  Follow Up Recommendations  SNF     Equipment Recommendations  Rolling walker with 5" wheels    Recommendations for Other Services       Precautions / Restrictions Restrictions Weight Bearing Restrictions: No    Mobility  Bed Mobility Overal bed mobility: Needs Assistance Bed Mobility: Supine to Sit;Sit to Supine     Supine to sit: Max assist Sit to supine: Mod assist   General bed mobility comments: Pt requires heavy trunk support and LE management for bed mobility. Cues required for sequencing of movement   Transfers Overall transfer level: Needs assistance Equipment used: Rolling walker (2 wheeled);1 person hand held assist Transfers: Sit to/from Stand Sit to Stand: Max assist         General transfer comment: Pt needs cues for hand placement prior to transfers and heavy trunk support to perform transfers. She requires cueing to get her hips under her and stand over her RW  Ambulation/Gait Ambulation/Gait assistance:  (Pt not able to take steps in standing)               Stairs            Wheelchair Mobility    Modified Rankin (Stroke Patients Only)       Balance Overall balance assessment: Needs assistance Sitting-balance support: Bilateral upper extremity supported Sitting  balance-Leahy Scale: Poor Sitting balance - Comments: Pt needs cues on where to place her hands   Standing balance support: Bilateral upper extremity supported Standing balance-Leahy Scale: Poor Standing balance comment: Needs cues to lean forward and her hands into her RW (Posterior lean)                    Cognition Arousal/Alertness: Lethargic Behavior During Therapy: WFL for tasks assessed/performed Overall Cognitive Status: Within Functional Limits for tasks assessed                      Exercises Other Exercises Other Exercises: Pt performed bilateral therex x10 reps at min assist for facilitation of movement: ankle pumps, quad sets, SAQ, hip abd/add, chest press into therapists hands, glute squeezes Other Exercises: Pt performed 4 attempts and sit-to-stand with rests inbetween for fatigue. This was done in conjunction with her sheets being cleaned (please reference transfers for movment details). Pt's O2 never fell below 90% on 3L O2 while performing any exercise or mobility     General Comments        Pertinent Vitals/Pain Pain Assessment: No/denies pain    Home Living                      Prior Function            PT Goals (current goals can now be found in the care plan  section) Acute Rehab PT Goals Patient Stated Goal: none stated PT Goal Formulation: With patient Time For Goal Achievement: 02/25/15 Potential to Achieve Goals: Good Progress towards PT goals: Progressing toward goals    Frequency  Min 2X/week    PT Plan Current plan remains appropriate    Co-evaluation             End of Session Equipment Utilized During Treatment: Gait belt;Oxygen Activity Tolerance: Patient limited by fatigue Patient left: in bed;with bed alarm set;with family/visitor present;with nursing/sitter in room;with call bell/phone within reach     Time: 1120-1150 PT Time Calculation (min) (ACUTE ONLY): 30 min  Charges:                        G CodesJanyth Contes 2015-03-16, 1:15 PM Janyth Contes, SPT. [phone]

## 2015-02-15 NOTE — Care Management Important Message (Signed)
Important Message  Patient Details  Name: Chelsea [name] MRN: [id number] Date of Birth: 1924/05/29   Medicare Important Message Given:  Yes-third notification given    Darius Bump Allmond 02/15/2015, 10:48 AM

## 2015-02-16 MED ORDER — PIPERACILLIN-TAZOBACTAM 3.375 G IVPB
3.3750 g | Freq: Three times a day (TID) | INTRAVENOUS | Status: DC
  Administered 2015-02-16 – 2015-02-17 (×3): 3.375 g via INTRAVENOUS
  Filled 2015-02-16 (×8): qty 50

## 2015-02-16 MED ORDER — POLYETHYLENE GLYCOL 3350 17 G PO PACK
17.0000 g | PACK | ORAL | Status: DC
  Administered 2015-02-18: 17 g via ORAL
  Filled 2015-02-16: qty 1

## 2015-02-16 NOTE — Plan of Care (Signed)
Problem: Phase II Progression Outcomes Goal: Wean O2 if indicated Outcome: Progressing Weaned to 3LO2 per [place] today

## 2015-02-16 NOTE — Progress Notes (Signed)
Speech Language Pathology Treatment: Dysphagia  Patient Details Name: Chelsea [name] MRN: [id number] DOB: 1924/06/06 Today's Date: 02/16/2015 Time: 0930-1030 SLP Time Calculation (min) (ACUTE ONLY): 60 min  Assessment / Plan / Recommendation Clinical Impression  Pt appeared to safely tolerate trials of mech soft diet and thin liquids w/ no overt s/s of aspiration noted. Pt exhibited min. Prolonged mastication effort during the oral phase. Given time and alternating food w/ liquid trials, pt cleared appropriately. Pt required intermittent, min. Verbal cues as reminders to chew the food well and clear mouth fully b/f taking another bite. Pt exhibited frequent belching b/t trials of food/liquid; suspect this could be related to her baseline dxs of GERD and a Hiatal Hernia. These dxs can increase Esophageal dysmotility w/ retrograde activity of bolus material which can lead to aspiration of Reflux material and pneumonia. Rec. GI f/u if such is suspected. Pt appears at reduced risk from an oropharyngeal standpoint for a Dys. III(mech soft) diet w/ thin liquids following general aspiration and Reflux precautions. ST will f/u per poc.    HPI Other Pertinent Information: Pt is a 81 y.o. female with a known history of chronic constipation (takes Miralax), CVA 1980's, CAD status post CABG, hiatal hernia with GERD, COPD and asthma, degenerative disc disease and spinal stenosis with chronic back pain presents to the hospital secondary to worsening cough, fever and chills and abdominal pain. Pt was verbally conversive but w/ noted Cognitive decline - unsure of pt's baseline Cognitive status as it is not noted per chart notes. Pt was able to follow general instruction/commands w/ verbal cues and direction at times. She needed support of opening packages and cutting foods at times during the breakfast meal/trials. She was alert and conversive w/ SLP Student about living in [place] in the past; she knew some of the  same areas as did the SLP Student(long term memory). She has tolerated her current pureed diet w/ thin liquids since yesterday's evaluation per NSG report.    Pertinent Vitals Pain Assessment: No/denies pain  SLP Plan  Continue with current plan of care    Recommendations Diet recommendations: Dysphagia 3 (mechanical soft);Thin liquid Liquids provided via: Cup;Straw Medication Administration: Whole meds with puree Supervision: Patient able to self feed;Intermittent supervision to cue for compensatory strategies Compensations: Minimize environmental distractions;Slow rate;Small sips/bites;Follow solids with liquid (as needed) Postural Changes and/or Swallow Maneuvers: Seated upright 90 degrees;Upright 30-60 min after meal              General recommendations:  (TBD) Follow up Recommendations: Skilled Nursing facility Plan: Continue with current plan of care    [place], [place], CCC-SLP  Isaih Bulger 02/16/2015, 10:46 AM

## 2015-02-16 NOTE — Progress Notes (Signed)
Physical Therapy Treatment Patient Details Name: Chelsea [name] MRN: [id number] DOB: 1923-07-06 Today's Date: 02/16/2015    History of Present Illness Pt is an 81 yo female from [facility] admitted to the hospital for bilateral pneumonia     PT Comments    Pt performing much better today than yesterday in terms of alertness and general strength. She is willing and motivated to participate in therapy and recognizes that she needs to practice mobility in order to get stronger. Although pt has improved physically, she still has profound strength deficits, evident by her transfers and inability to ambulate. She will continue to benefit from skilled PT in order to address these deficits and return her to optimal PLOF. All mobility performed on 3L O2 [place] with O2 sats never falling below 93%. Pt in NAD during any mobility.   Follow Up Recommendations  SNF     Equipment Recommendations  Rolling walker with 5" wheels    Recommendations for Other Services       Precautions / Restrictions Precautions Precautions: Fall Restrictions Weight Bearing Restrictions: No    Mobility  Bed Mobility Overal bed mobility: Needs Assistance Bed Mobility: Supine to Sit;Sit to Supine     Supine to sit: Mod assist Sit to supine: Mod assist   General bed mobility comments: Pt requiring less assist today for her LEs and demonstrates better use of her hands to assist her in getting to EOB. Still needs cues on hand placement  Transfers Overall transfer level: Needs assistance Equipment used: Rolling walker (2 wheeled);1 person hand held assist Transfers: Sit to/from Stand Sit to Stand: Max assist         General transfer comment: Pt was better able to get into standing today and did not need quited the assist to get into full standing, however once in standing she is unable to maintain her balance with RW and will drift posteriorly. She needed multiple corrections after LOBs during a 15 sec stand.  Tactile cues were provided to posterior hip/saccrum to help get her hips under her, but pt appears to lack functional strength to remain standing. That being said, her performance to get to standing was a better effort than yesterday.  (Transfers attempted x5 for practice)  Ambulation/Gait Ambulation/Gait assistance:  (Not safe this date)               Stairs            Wheelchair Mobility    Modified Rankin (Stroke Patients Only)       Balance Overall balance assessment: Needs assistance Sitting-balance support: Bilateral upper extremity supported Sitting balance-Leahy Scale: Fair Sitting balance - Comments: Pt needs cues on where to place her hands   Standing balance support: Bilateral upper extremity supported Standing balance-Leahy Scale: Poor Standing balance comment: Pt will lean posteriorly and cannot maintain standing balance without assist from second person                    Cognition Arousal/Alertness: Awake/alert (Much more alert this date with therapist) Behavior During Therapy: [facility] for tasks assessed/performed Overall Cognitive Status: Within Functional Limits for tasks assessed                      Exercises Other Exercises Other Exercises: Pt performed bilateral therex x12 reps at supervision for proper technique: ankle pumps, quad sets, SAQ, hip abd/add, chest press into therapists hands, glute squeezes Other Exercises: Pt performed 5 attempts of sit-to-stand at  max assist (reference transfer section for details) with breaks in between for pt rest.  (x 10 min)    General Comments        Pertinent Vitals/Pain Pain Assessment: No/denies pain    Home Living                      Prior Function            PT Goals (current goals can now be found in the care plan section) Acute Rehab PT Goals Patient Stated Goal: none stated PT Goal Formulation: With patient Time For Goal Achievement: 02/25/15 Potential to Achieve  Goals: Good Progress towards PT goals: Progressing toward goals    Frequency  Min 2X/week    PT Plan Current plan remains appropriate    Co-evaluation             End of Session Equipment Utilized During Treatment: Gait belt;Oxygen Activity Tolerance: Patient tolerated treatment well Patient left: in bed;with call bell/phone within reach;with bed alarm set     Time: 1550-1614 PT Time Calculation (min) (ACUTE ONLY): 24 min  Charges:                       G CodesJanyth Contes 03-15-2015, 4:56 PM  Janyth Contes, SPT. [phone]

## 2015-02-16 NOTE — Progress Notes (Signed)
[facility] [place] NAME: Chelsea [name]    MR#:  [id number]  DATE OF BIRTH:  Jul 05, 1923  SUBJECTIVE:  CHIEF COMPLAINT:   Chief Complaint  Patient presents with  . Cough  . Constipation  . Pneumonia   the patient is a 81 year old female with history of coronary artery disease status post coronary artery bypass grafting, bilateral hernia, gastroesophageal reflux disease, stroke, COPD who presents to the hospital with complaints of cough, fevers and chills as well as abdominal pain. Admitted for bilateral pneumonia. Today she c/o bowel incontinenece,mild abdominal pain.no  Other complaints..   Review of Systems  Constitutional: Negative for fever, chills and weight loss.  HENT: Negative for congestion.   Eyes: Negative for blurred vision and double vision.  Respiratory: Negative for cough, sputum production, shortness of breath and wheezing.   Cardiovascular: Negative for chest pain, palpitations, orthopnea, leg swelling and PND.       Right side chest wall pain  Gastrointestinal: Negative for nausea, vomiting, abdominal pain, diarrhea, constipation and blood in stool.  Genitourinary: Negative for dysuria, urgency, frequency and hematuria.       Urine retension  Musculoskeletal: Negative for falls.  Neurological: Negative for dizziness, tremors, focal weakness and headaches.  Endo/Heme/Allergies: Does not bruise/bleed easily.  Psychiatric/Behavioral: Negative for depression. The patient does not have insomnia.     VITAL SIGNS: Blood pressure 150/49, pulse 72, temperature 98.2 F (36.8 C), temperature source Oral, resp. rate 17, height 5\' 5"  (1.651 m), weight 62.687 kg (138 lb 3.2 oz), SpO2 97 %.  PHYSICAL EXAMINATION:   GENERAL:  81 y.o.-year-old patient lying in the bed with no acute distress.  EYES: Pupils equal, round, reactive to light and accommodation. No scleral icterus. Extraocular muscles intact.  HEENT: Head  atraumatic, normocephalic. Oropharynx and nasopharynx clear.  NECK:  Supple, no jugular venous distention. No thyroid enlargement, no tenderness.  LUNGS: Diminished breath sounds bilaterally, crackles at bases, especially on the left anteriorly as well as posteriorly. No use of accessory muscles of respiration.  CARDIOVASCULAR: S1, S2 normal. No murmurs, rubs, or gallops.  ABDOMEN: Soft, diffusely and some voluntary guarding in the upper abdomen, no rebound , nondistended. Bowel sounds present. No organomegaly or mass.  EXTREMITIES: No pedal edema, cyanosis, or clubbing.  NEUROLOGIC: Cranial nerves II through XII are intact. Muscle strength 5/5 in all extremities. Sensation intact. Gait not checked.  PSYCHIATRIC: The patient is alert and oriented x 3.  SKIN: No obvious rash, lesion, or ulcer.   ORDERS/RESULTS REVIEWED:   CBC  Recent Labs Lab 02/10/15 1431 02/11/15 0549 02/12/15 0613 02/13/15 0456 02/15/15 0512  WBC 10.5 9.7  --  10.1 9.9  HGB 11.0* 9.5* 9.6* 9.3* 9.4*  HCT 33.2* 28.5*  --  28.7* 28.7*  PLT 331 296  --  367 403  MCV 86.7 86.7  --  87.0 86.3  MCH 28.7 29.1  --  28.2 28.2  MCHC 33.1 33.5  --  32.5 32.7  RDW 18.2* 18.3*  --  18.1* 17.9*   ------------------------------------------------------------------------------------------------------------------  Chemistries   Recent Labs Lab 02/10/15 1431 02/11/15 0549 02/12/15 0613 02/15/15 0512  NA 134* 135  --  133*  K 4.5 4.4  --  4.0  CL 99* 103  --  102  CO2 24 23  --  24  GLUCOSE 107* 104*  --  108*  BUN 27* 22*  --  10  CREATININE 1.33* 1.08* 1.07* 0.92  CALCIUM 8.4* 7.9*  --  7.7*  AST 41  --   --   --   ALT 19  --   --   --   ALKPHOS 84  --   --   --   BILITOT 0.6  --   --   --    ------------------------------------------------------------------------------------------------------------------ estimated creatinine clearance is 37.3 mL/min (by C-G formula based on Cr of  0.92). ------------------------------------------------------------------------------------------------------------------ No results for input(s): TSH, T4TOTAL, T3FREE, THYROIDAB in the last 72 hours.  Invalid input(s): FREET3  Cardiac Enzymes  Recent Labs Lab 02/10/15 2328 02/11/15 0549 02/11/15 0944  CKMB 4.3 6.0* 5.4*  TROPONINI 0.03 0.04* <0.03   ------------------------------------------------------------------------------------------------------------------ Invalid input(s): POCBNP ---------------------------------------------------------------------------------------------------------------  RADIOLOGY: No results found.  EKG:  Orders placed or performed during the hospital encounter of 02/10/15  . ED EKG  . ED EKG  . EKG 12-Lead  . EKG 12-Lead    ASSESSMENT AND PLAN:  Active Problems:   Pneumonia   Elevated troponin   Hypoxia   SOB (shortness of breath)   Weakness   Viral URI 1. Bilateral lower lobe pneumoniawith possible  aspiration pneumonitis, continue patient on broad-spectrum antibiotic therapy with vanco ,zosyn,clinically improving  l 2.  Diarrhea, stool cultures are negative for pathogenic flora, C. difficile toxin was negative, diarrhea has subsided , 3. Diffuse abdominal pain, idue to constipation;resolved pain  ,continue stool softners  4. Pyuria. Suspected UTI, urine culture is not reported. Continue zosyn, 5. Generalized weakness. Get physical therapist involved,d/c to SNF 6. Renal insufficiency, stable with IV fluids, d/c iv fluids 7. Elevated troponin. Likely demand ischemia , appreciate cardiologist input, echocardiogram is completed,  With normal EF,no further cardiac work up. 8.right side pleuritic chest pain;improved DNR  DRUG ALLERGIES:  Allergies  Allergen Reactions  . Sulfonamide Derivatives Other (See Comments)    Reaction:  Dizziness     CODE STATUS:     Code Status Orders        Start     Ordered   02/10/15 2146  Do not  attempt resuscitation (DNR)   Continuous    Question Answer Comment  In the event of cardiac or respiratory ARREST Do not call a "code blue"   In the event of cardiac or respiratory ARREST Do not perform Intubation, CPR, defibrillation or ACLS   In the event of cardiac or respiratory ARREST Use medication by any route, position, wound care, and other measures to relive pain and suffering. May use oxygen, suction and manual treatment of airway obstruction as needed for comfort.      02/10/15 2145    Advance Directive Documentation        Most Recent Value   Type of Advance Directive  Healthcare Power of Attorney, Living will   Pre-existing out of facility DNR order (yellow form or pink MOST form)     "MOST" Form in Place?        TOTAL TIME TAKING CARE OF THIS PATIENT: 35 minutes.    Epifanio Lesches M.D on 02/16/2015 at 4:55 PM  Between 7am to 6pm - Pager - [phone]  After 6pm go to [email or web address] - password [email or web address] [facility]  [facility]  Office  [phone]  CC: Primary care physician; Margarita Rana, MD

## 2015-02-16 NOTE — Progress Notes (Signed)
ANTIBIOTIC CONSULT NOTE - FOLLOW UP  Pharmacy Consult for Zosyn and Vancomycin Indication: pneumonia  Allergies  Allergen Reactions  . Sulfonamide Derivatives Other (See Comments)    Reaction:  Dizziness     Patient Measurements: Height: 5\' 5"  (165.1 cm) Weight: 138 lb 3.2 oz (62.687 kg) IBW/kg (Calculated) : 57   Vital Signs: Temp: 98.2 F (36.8 C) (08/30 0749) Temp Source: Oral (08/30 0749) BP: 153/45 mmHg (08/30 0749) Pulse Rate: 73 (08/30 0749) Intake/Output from previous day: 08/29 0701 - 08/30 0700 In: 0  Out: 1450 [Urine:1450] Intake/Output from this shift: Total I/O In: 120 [P.O.:120] Out: -   Labs:  Recent Labs  02/15/15 0512  WBC 9.9  HGB 9.4*  PLT 403  CREATININE 0.92   Estimated Creatinine Clearance: 37.3 mL/min (by C-G formula based on Cr of 0.92). No results for input(s): VANCOTROUGH, VANCOPEAK, VANCORANDOM, GENTTROUGH, GENTPEAK, GENTRANDOM, TOBRATROUGH, TOBRAPEAK, TOBRARND, AMIKACINPEAK, AMIKACINTROU, AMIKACIN in the last 72 hours.   Microbiology: Recent Results (from the past 720 hour(s))  Culture, blood (routine x 2)     Status: None   Collection Time: 02/10/15  4:35 PM  Result Value Ref Range Status   Specimen Description BLOOD LEFT ARM  Final   Special Requests BOTTLES DRAWN AEROBIC AND ANAEROBIC 2CC  Final   Culture NO GROWTH 5 DAYS  Final   Report Status 02/15/2015 FINAL  Final  Culture, blood (routine x 2)     Status: None   Collection Time: 02/10/15  4:35 PM  Result Value Ref Range Status   Specimen Description BLOOD RIGHT WRIST  Final   Special Requests BOTTLES DRAWN AEROBIC AND ANAEROBIC 4CC  Final   Culture NO GROWTH 5 DAYS  Final   Report Status 02/15/2015 FINAL  Final  MRSA PCR Screening     Status: None   Collection Time: 02/11/15 12:58 AM  Result Value Ref Range Status   MRSA by PCR NEGATIVE NEGATIVE Final    Comment:        The GeneXpert MRSA Assay (FDA approved for NASAL specimens only), is one component of  a comprehensive MRSA colonization surveillance program. It is not intended to diagnose MRSA infection nor to guide or monitor treatment for MRSA infections.   Stool culture     Status: None   Collection Time: 02/11/15 12:30 PM  Result Value Ref Range Status   Specimen Description STOOL  Final   Special Requests Normal  Final   Culture   Final    NO SALMONELLA OR SHIGELLA ISOLATED NO CAMPYLOBACTER DETECTED No Pathogenic E. coli detected    Report Status 02/13/2015 FINAL  Final  C difficile quick scan w PCR reflex     Status: None   Collection Time: 02/11/15 12:30 PM  Result Value Ref Range Status   C Diff antigen NEGATIVE NEGATIVE Final   C Diff toxin NEGATIVE NEGATIVE Final   C Diff interpretation Negative for C. difficile  Final  Urine culture     Status: None (Preliminary result)   Collection Time: 02/13/15  4:30 PM  Result Value Ref Range Status   Specimen Description URINE, CATHETERIZED  Final   Special Requests Normal  Final   Culture   Final    20,000 COLONIES/mL YEAST IDENTIFICATION TO FOLLOW HOLDING FOR ADDITIONAL POSSIBLE PATHOGEN    Report Status PENDING  Incomplete    Anti-infectives    Start     Dose/Rate Route Frequency Ordered Stop   02/16/15 1400  piperacillin-tazobactam (ZOSYN) IVPB 3.375 g  3.375 g 12.5 mL/hr over 240 Minutes Intravenous 3 times per day 02/16/15 1119     02/15/15 0700  vancomycin (VANCOCIN) IVPB 750 mg/150 ml premix     750 mg 150 mL/hr over 60 Minutes Intravenous Every 24 hours 02/14/15 1640     02/14/15 1800  piperacillin-tazobactam (ZOSYN) IVPB 3.375 g  Status:  Discontinued     3.375 g 12.5 mL/hr over 240 Minutes Intravenous 4 times per day 02/14/15 1509 02/14/15 1551   02/14/15 1600  piperacillin-tazobactam (ZOSYN) IVPB 4.5 g  Status:  Discontinued     4.5 g 25 mL/hr over 240 Minutes Intravenous 3 times per day 02/14/15 1552 02/16/15 1119   02/14/15 1515  vancomycin (VANCOCIN) IVPB 1000 mg/200 mL premix  Status:   Discontinued     1,000 mg 200 mL/hr over 60 Minutes Intravenous Every 24 hours 02/14/15 1509 02/14/15 1642   02/11/15 1800  azithromycin (ZITHROMAX) 500 mg in dextrose 5 % 250 mL IVPB  Status:  Discontinued     500 mg 250 mL/hr over 60 Minutes Intravenous Every 24 hours 02/10/15 2145 02/14/15 1509   02/10/15 2200  cefTRIAXone (ROCEPHIN) 1 g in dextrose 5 % 50 mL IVPB  Status:  Discontinued     1 g 100 mL/hr over 30 Minutes Intravenous Every 24 hours 02/10/15 2145 02/14/15 1509   02/10/15 1815  levofloxacin (LEVAQUIN) IVPB 750 mg     750 mg 100 mL/hr over 90 Minutes Intravenous  Once 02/10/15 1808 02/10/15 2112      Assessment: Patient is on day 3 of treatment of possible aspiration pneumonia and UTI. Micro showing yeast for UTI and blood no growth x5 days. Patient showing clinical signs of improvement and non-productive cough.   Plan:  Will continue to monitor patients renal function and symptoms. Will change Zosyn to 3.375mg  every 8 hours (4 hour infusion), do not see additive benefit of 4.5mg  dosing over 3.375mg  dosing. Vanc trough ordered for 9/1 at 0630, will continue vancomycin 1000mg  every 24 hours. Will recommend discontinuing vancomycin.   Nancy Fetter, PharmD Pharmacy Resident

## 2015-02-16 NOTE — Progress Notes (Signed)
Speech Therapy Note: f/u w/ pt at lunch meal w/ newly upgraded diet consistency. Pt appeared to tolerate the diet adequately and fed self independently. ST will f/u w/ toleration of diet while admitted. Rec. Full Cognitive assessment for safety w/ all ADLs if pt is to return to independent living; rec. Skilled Rehab at this time post d/c d/t pt's medical status and weakness. ST will f/u next 1-3 days. CM and NSG updated.

## 2015-02-17 LAB — URINE CULTURE: Special Requests: NORMAL

## 2015-02-17 MED ORDER — AMOXICILLIN-POT CLAVULANATE 875-125 MG PO TABS
1.0000 | ORAL_TABLET | Freq: Two times a day (BID) | ORAL | Status: DC
  Administered 2015-02-17 – 2015-02-18 (×3): 1 via ORAL
  Filled 2015-02-17 (×4): qty 1

## 2015-02-17 MED ORDER — FUROSEMIDE 10 MG/ML IJ SOLN
40.0000 mg | Freq: Every day | INTRAMUSCULAR | Status: DC
  Administered 2015-02-17 – 2015-02-18 (×2): 40 mg via INTRAVENOUS
  Filled 2015-02-17 (×2): qty 4

## 2015-02-17 NOTE — Progress Notes (Signed)
[facility] [place] NAME: Chelsea [name]    MR#:  [id number]  DATE OF BIRTH:  08/16/1923  SUBJECTIVE:  CHIEF COMPLAINT:   Chief Complaint  Patient presents with  . Cough  . Constipation  . Pneumonia   the patient is a 81 year old female with history of coronary artery disease status post coronary artery bypass grafting, bilateral hernia, gastroesophageal reflux disease, stroke, COPD who presents to the hospital with complaints of cough, fevers and chills as well as abdominal pain. Admitted for bilateral pneumonia. Today she c/o bowel incontinenece,mild abdominal pain.no  Other complaints..   Review of Systems  Constitutional: Negative for fever, chills and weight loss.  HENT: Negative for congestion.   Eyes: Negative for blurred vision and double vision.  Respiratory: Negative for cough, sputum production, shortness of breath and wheezing.   Cardiovascular: Negative for chest pain, palpitations, orthopnea, leg swelling and PND.       Right side chest wall pain  Gastrointestinal: Negative for nausea, vomiting, abdominal pain, diarrhea, constipation and blood in stool.  Genitourinary: Negative for dysuria, urgency, frequency and hematuria.       Urine retension  Musculoskeletal: Negative for falls.  Neurological: Negative for dizziness, tremors, focal weakness and headaches.  Endo/Heme/Allergies: Does not bruise/bleed easily.  Psychiatric/Behavioral: Negative for depression. The patient does not have insomnia.     VITAL SIGNS: Blood pressure 147/51, pulse 69, temperature 98 F (36.7 C), temperature source Oral, resp. rate 18, height 5\' 5"  (1.651 m), weight 64.683 kg (142 lb 9.6 oz), SpO2 94 %.  PHYSICAL EXAMINATION:   GENERAL:  81 y.o.-year-old patient lying in the bed with no acute distress.  EYES: Pupils equal, round, reactive to light and accommodation. No scleral icterus. Extraocular muscles intact.  HEENT: Head  atraumatic, normocephalic. Oropharynx and nasopharynx clear.  NECK:  Supple, no jugular venous distention. No thyroid enlargement, no tenderness.  LUNGS: Diminished breath sounds bilaterally, crackles at bases, especially on the left anteriorly as well as posteriorly. No use of accessory muscles of respiration.  CARDIOVASCULAR: S1, S2 normal. No murmurs, rubs, or gallops.  ABDOMEN: Soft, diffusely and some voluntary guarding in the upper abdomen, no rebound , nondistended. Bowel sounds present. No organomegaly or mass.  EXTREMITIES: No pedal edema, cyanosis, or clubbing.  NEUROLOGIC: Cranial nerves II through XII are intact. Muscle strength 5/5 in all extremities. Sensation intact. Gait not checked.  PSYCHIATRIC: The patient is alert and oriented x 3.  SKIN: No obvious rash, lesion, or ulcer.   ORDERS/RESULTS REVIEWED:   CBC  Recent Labs Lab 02/10/15 1431 02/11/15 0549 02/12/15 0613 02/13/15 0456 02/15/15 0512  WBC 10.5 9.7  --  10.1 9.9  HGB 11.0* 9.5* 9.6* 9.3* 9.4*  HCT 33.2* 28.5*  --  28.7* 28.7*  PLT 331 296  --  367 403  MCV 86.7 86.7  --  87.0 86.3  MCH 28.7 29.1  --  28.2 28.2  MCHC 33.1 33.5  --  32.5 32.7  RDW 18.2* 18.3*  --  18.1* 17.9*   ------------------------------------------------------------------------------------------------------------------  Chemistries   Recent Labs Lab 02/10/15 1431 02/11/15 0549 02/12/15 0613 02/15/15 0512  NA 134* 135  --  133*  K 4.5 4.4  --  4.0  CL 99* 103  --  102  CO2 24 23  --  24  GLUCOSE 107* 104*  --  108*  BUN 27* 22*  --  10  CREATININE 1.33* 1.08* 1.07* 0.92  CALCIUM 8.4* 7.9*  --  7.7*  AST 41  --   --   --   ALT 19  --   --   --   ALKPHOS 84  --   --   --   BILITOT 0.6  --   --   --    ------------------------------------------------------------------------------------------------------------------ estimated creatinine clearance is 37.3 mL/min (by C-G formula based on Cr of  0.92). ------------------------------------------------------------------------------------------------------------------ No results for input(s): TSH, T4TOTAL, T3FREE, THYROIDAB in the last 72 hours.  Invalid input(s): FREET3  Cardiac Enzymes  Recent Labs Lab 02/10/15 2328 02/11/15 0549 02/11/15 0944  CKMB 4.3 6.0* 5.4*  TROPONINI 0.03 0.04* <0.03   ------------------------------------------------------------------------------------------------------------------ Invalid input(s): POCBNP ---------------------------------------------------------------------------------------------------------------  RADIOLOGY: No results found.  EKG:  Orders placed or performed during the hospital encounter of 02/10/15  . ED EKG  . ED EKG  . EKG 12-Lead  . EKG 12-Lead    ASSESSMENT AND PLAN:  Active Problems:   Pneumonia   Elevated troponin   Hypoxia   SOB (shortness of breath)   Weakness   Viral URI 1. Bilateral lower lobe pneumonia with possible  aspiration pneumonitis, continue patient on broad-spectrum antibiotic therapy . Stop vancomycin. Cx negative Will start IV lasix today  2.  Diarrhea, stool cultures are negative for pathogenic flora, C. difficile toxin was negative, diarrhea has subsided  3. Diffuse abdominal pain, idue to constipation;resolved pain  ,continue stool softners   4. Pyuria. Suspected UTI, urine culture is not reported. Continue zosyn, 5. Generalized weakness. Get physical therapist involved,d/c to SNF  6. Renal insufficiency, stable  7. Elevated troponin. Likely demand ischemia , appreciate cardiologist input, echocardiogram is completed,  With normal EF,no further cardiac work up.  DNR  DRUG ALLERGIES:  Allergies  Allergen Reactions  . Sulfonamide Derivatives Other (See Comments)    Reaction:  Dizziness     CODE STATUS:     Code Status Orders        Start     Ordered   02/10/15 2146  Do not attempt resuscitation (DNR)   Continuous     Question Answer Comment  In the event of cardiac or respiratory ARREST Do not call a "code blue"   In the event of cardiac or respiratory ARREST Do not perform Intubation, CPR, defibrillation or ACLS   In the event of cardiac or respiratory ARREST Use medication by any route, position, wound care, and other measures to relive pain and suffering. May use oxygen, suction and manual treatment of airway obstruction as needed for comfort.      02/10/15 2145    Advance Directive Documentation        Most Recent Value   Type of Advance Directive  Healthcare Power of Attorney, Living will   Pre-existing out of facility DNR order (yellow form or pink MOST form)     "MOST" Form in Place?        TOTAL TIME TAKING CARE OF THIS PATIENT: 35 minutes.   Discharge to SNF tomorrow   Hillary Bow R M.D on 02/17/2015 at 12:19 PM  Between 7am to 6pm - Pager - [phone]  After 6pm go to [email or web address] - password [email or web address] [facility]  [facility]  Office  [phone]  CC: Primary care physician; Margarita Rana, MD

## 2015-02-17 NOTE — Care Management (Signed)
IV Lasix added to plan of treatment. Attending anticipates discharge to skilled nursing 02/18/2015

## 2015-02-17 NOTE — Plan of Care (Signed)
Problem: Phase II Progression Outcomes Goal: Wean O2 if indicated Outcome: Progressing On 2LO2 per [place]

## 2015-02-18 DIAGNOSIS — R0902 Hypoxemia: Secondary | ICD-10-CM | POA: Diagnosis not present

## 2015-02-18 DIAGNOSIS — J189 Pneumonia, unspecified organism: Secondary | ICD-10-CM | POA: Diagnosis not present

## 2015-02-18 DIAGNOSIS — R7989 Other specified abnormal findings of blood chemistry: Secondary | ICD-10-CM | POA: Diagnosis not present

## 2015-02-18 DIAGNOSIS — J439 Emphysema, unspecified: Secondary | ICD-10-CM | POA: Diagnosis not present

## 2015-02-18 DIAGNOSIS — I2510 Atherosclerotic heart disease of native coronary artery without angina pectoris: Secondary | ICD-10-CM | POA: Diagnosis not present

## 2015-02-18 DIAGNOSIS — N289 Disorder of kidney and ureter, unspecified: Secondary | ICD-10-CM | POA: Diagnosis not present

## 2015-02-18 DIAGNOSIS — I10 Essential (primary) hypertension: Secondary | ICD-10-CM | POA: Diagnosis not present

## 2015-02-18 DIAGNOSIS — J069 Acute upper respiratory infection, unspecified: Secondary | ICD-10-CM | POA: Diagnosis not present

## 2015-02-18 DIAGNOSIS — Z9981 Dependence on supplemental oxygen: Secondary | ICD-10-CM | POA: Diagnosis not present

## 2015-02-18 DIAGNOSIS — R197 Diarrhea, unspecified: Secondary | ICD-10-CM | POA: Diagnosis not present

## 2015-02-18 DIAGNOSIS — R269 Unspecified abnormalities of gait and mobility: Secondary | ICD-10-CM | POA: Diagnosis not present

## 2015-02-18 DIAGNOSIS — M6281 Muscle weakness (generalized): Secondary | ICD-10-CM | POA: Diagnosis not present

## 2015-02-18 DIAGNOSIS — I214 Non-ST elevation (NSTEMI) myocardial infarction: Secondary | ICD-10-CM | POA: Diagnosis not present

## 2015-02-18 LAB — BASIC METABOLIC PANEL
Anion gap: 10 (ref 5–15)
BUN: 9 mg/dL (ref 6–20)
CALCIUM: 8.4 mg/dL — AB (ref 8.9–10.3)
CO2: 32 mmol/L (ref 22–32)
CREATININE: 0.68 mg/dL (ref 0.44–1.00)
Chloride: 97 mmol/L — ABNORMAL LOW (ref 101–111)
Glucose, Bld: 110 mg/dL — ABNORMAL HIGH (ref 65–99)
Potassium: 3.3 mmol/L — ABNORMAL LOW (ref 3.5–5.1)
SODIUM: 139 mmol/L (ref 135–145)

## 2015-02-18 MED ORDER — FUROSEMIDE 20 MG PO TABS: 20.0000 mg | ORAL_TABLET | Freq: Every day | ORAL | Status: DC

## 2015-02-18 MED ORDER — AMOXICILLIN-POT CLAVULANATE 875-125 MG PO TABS: 1.0000 | ORAL_TABLET | Freq: Two times a day (BID) | ORAL | Status: DC

## 2015-02-18 MED ORDER — METOPROLOL SUCCINATE ER 25 MG PO TB24: 12.5000 mg | ORAL_TABLET | Freq: Every day | ORAL | Status: DC

## 2015-02-18 MED ORDER — OXYCODONE-ACETAMINOPHEN 10-325 MG PO TABS: 1.0000 | ORAL_TABLET | Freq: Four times a day (QID) | ORAL | Status: DC | PRN

## 2015-02-18 MED ORDER — DIAZEPAM 5 MG PO TABS: 5.0000 mg | ORAL_TABLET | Freq: Two times a day (BID) | ORAL | Status: DC | PRN

## 2015-02-18 MED ORDER — ENSURE ENLIVE PO LIQD: 237.0000 mL | Freq: Two times a day (BID) | ORAL | Status: DC

## 2015-02-18 MED ORDER — METAXALONE 800 MG PO TABS: 800.0000 mg | ORAL_TABLET | Freq: Three times a day (TID) | ORAL | Status: DC | PRN

## 2015-02-18 MED ORDER — POTASSIUM CHLORIDE CRYS ER 20 MEQ PO TBCR
40.0000 meq | EXTENDED_RELEASE_TABLET | ORAL | Status: AC
  Administered 2015-02-18 (×2): 40 meq via ORAL
  Filled 2015-02-18 (×2): qty 2

## 2015-02-18 MED ORDER — POTASSIUM CHLORIDE ER 10 MEQ PO TBCR: 10.0000 meq | EXTENDED_RELEASE_TABLET | Freq: Every day | ORAL | Status: DC

## 2015-02-18 NOTE — Progress Notes (Signed)
Report called to Festus Aloe RN at [facility].

## 2015-02-18 NOTE — Care Management Important Message (Signed)
Important Message  Patient Details  Name: Chelsea [name] MRN: [id number] Date of Birth: 01-04-1924   Medicare Important Message Given:  Yes-fourth notification given    Juliann Pulse A Allmond 02/18/2015, 10:45 AM

## 2015-02-18 NOTE — Discharge Summary (Signed)
[place] [facility] [place] NAME: Robinette Esters    MR#:  [id number]  DATE OF BIRTH:  05/11/1924  DATE OF ADMISSION:  02/10/2015 ADMITTING PHYSICIAN: Gladstone Lighter, MD  DATE OF DISCHARGE: No discharge date for patient encounter.  PRIMARY CARE PHYSICIAN: Margarita Rana, MD    ADMISSION DIAGNOSIS:  Pneumonia, organism unspecified [J18.9] Weakness [R53.1] SOB (shortness of breath) [R06.02] Hypoxia [R09.02] NSTEMI (non-ST elevated myocardial infarction) [I21.4]  DISCHARGE DIAGNOSIS:  Active Problems:   Pneumonia   Elevated troponin   Hypoxia   SOB (shortness of breath)   Weakness   Viral URI   SECONDARY DIAGNOSIS:   Past Medical History  Diagnosis Date  . Coronary artery disease     a. s/p 2 vessel CABG 1989; b. echo 2003: nl; c. cath 2006: patent grafts, EF 65%; d. nuclear stress test 2007: no ischemia, EF 81%  . Hyperlipidemia   . Hypertension   . Bradycardia     hx of it and fatigue with beta blockade  . History of hyperkalemia   . Carotid artery disease     s/p carotid endarterectomy  . Stroke 1980s    left brain  . Mitral valve prolapse   . Osteoporosis   . Asthma   . Degenerative disc disease   . Spinal stenosis in cervical region   . Norovirus 2014     ADMITTING HISTORY  Eshaal Duby is a 81 y.o. female with a known history of CAD status post CABG, hiatal hernia with GERD, history of CVA, COPD and asthma, degenerative disc disease and spinal stenosis with chronic back pain presents to the hospital secondary to worsening cough, fever and chills and abdominal pain. Patient started having chills 4 days ago with low-grade fever the next day. She also started to have a cough which was nonproductive at that time but became productive now. She was experiencing some difficulty breathing on exertion. She lives in an independent apartment at [address] and ambulates with a walker. She had a nurse, over to check on  her couple of days ago and at that time she had just a low-grade fever. Her belly was hurting since yesterday, patient has trouble with chronic constipation and took MiraLAX which resulted in some diarrhea. Her PCP started her on Flagyl and Levaquin yesterday. Her symptoms were not improving, she just felt weak, couldn't do it anymore at home and so presented to the hospital. Chest x-ray here revealed bibasilar pneumonia versus on the left side. CT of the abdomen done for abdominal pain chest showed hiatal hernia and no acute findings.   HOSPITAL COURSE:   1. Bilateral lower lobe pneumonia with possible aspiration pneumonitis, continue patient on broad-spectrum antibiotic therapy . Stopped vancomycin. Cx negative Continue augmentin PO Acute respiratory failure - On 2 L O2  2. Diarrhea, stool cultures are negative for pathogenic flora, C. difficile toxin was negative, diarrhea has subsided  3. Diffuse abdominal pain, due to constipation;resolved pain ,continue stool softners   4. Pyuria. Suspected UTI, urine culture is not reported. Continued on zosyn  5. Generalized weakness. Get physical therapist involved,d/c to SNF  6. Renal insufficiency, stable  7. Elevated troponin. Likely demand ischemia , appreciate cardiologist input, echocardiogram is completed, With normal EF,no further cardiac work up.  8. AOCD  Stable for discharge  CONSULTS OBTAINED:  Treatment Team:  Minna Merritts, MD Josefine Class, MD  DRUG ALLERGIES:   Allergies  Allergen Reactions  . Sulfonamide Derivatives  Other (See Comments)    Reaction:  Dizziness     DISCHARGE MEDICATIONS:   Current Discharge Medication List    START taking these medications   Details  amoxicillin-clavulanate (AUGMENTIN) 875-125 MG per tablet Take 1 tablet by mouth 2 (two) times daily. Qty: 10 tablet, Refills: 0    feeding supplement, ENSURE ENLIVE, (ENSURE ENLIVE) LIQD Take 237 mLs by mouth 2 (two) times daily with  a meal. Qty: 237 mL, Refills: 12    furosemide (LASIX) 20 MG tablet Take 1 tablet (20 mg total) by mouth daily. Qty: 15 tablet, Refills: 0    metoprolol succinate (TOPROL-XL) 25 MG 24 hr tablet Take 0.5 tablets (12.5 mg total) by mouth daily. Qty: 30 tablet, Refills: 0    potassium chloride (K-DUR) 10 MEQ tablet Take 1 tablet (10 mEq total) by mouth daily. Qty: 15 tablet, Refills: 0      CONTINUE these medications which have CHANGED   Details  diazepam (VALIUM) 5 MG tablet Take 1 tablet (5 mg total) by mouth every 12 (twelve) hours as needed for anxiety. Qty: 20 tablet, Refills: 0    metaxalone (SKELAXIN) 800 MG tablet Take 1 tablet (800 mg total) by mouth 3 (three) times daily as needed for muscle spasms. Qty: 15 tablet, Refills: 0    oxyCODONE-acetaminophen (PERCOCET) 10-325 MG per tablet Take 1 tablet by mouth every 6 (six) hours as needed for pain. Qty: 20 tablet, Refills: 0   Associated Diagnoses: DDD (degenerative disc disease), lumbar; Neuritis or radiculitis due to rupture of lumbar intervertebral disc      CONTINUE these medications which have NOT CHANGED   Details  albuterol (PROVENTIL HFA;VENTOLIN HFA) 108 (90 BASE) MCG/ACT inhaler Inhale 2 puffs into the lungs every 6 (six) hours as needed for wheezing or shortness of breath.     alendronate (FOSAMAX) 70 MG tablet Take 70 mg by mouth once a week.     amLODipine (NORVASC) 10 MG tablet Take 10 mg by mouth daily.    aspirin EC 81 MG tablet Take 81 mg by mouth daily.    atorvastatin (LIPITOR) 80 MG tablet Take 1 tablet (80 mg total) by mouth daily. Qty: 90 tablet, Refills: 4    beclomethasone (BECONASE-AQ) 42 MCG/SPRAY nasal spray Place 1 spray into both nostrils 2 (two) times daily.     calcium citrate-vitamin D (CITRACAL+D) 315-200 MG-UNIT per tablet Take 1 tablet by mouth daily.    fluticasone (FLONASE) 50 MCG/ACT nasal spray Place 2 sprays into both nostrils daily.    Fluticasone Furoate-Vilanterol (BREO  ELLIPTA) 100-25 MCG/INH AEPB Inhale 1 puff into the lungs daily.    lansoprazole (PREVACID) 30 MG capsule Take 30 mg by mouth 2 (two) times daily.    levothyroxine (SYNTHROID, LEVOTHROID) 88 MCG tablet Take 88 mcg by mouth daily before breakfast.    montelukast (SINGULAIR) 10 MG tablet Take 10 mg by mouth at bedtime.    Multiple Vitamins-Minerals (PRESERVISION AREDS PO) Take 1 capsule by mouth daily.     nitroGLYCERIN (NITROSTAT) 0.4 MG SL tablet Place 1 tablet (0.4 mg total) under the tongue every 5 (five) minutes as needed. Qty: 25 tablet, Refills: 6    oxybutynin (DITROPAN) 5 MG tablet Take 5 mg by mouth at bedtime.    senna (SENOKOT) 8.6 MG TABS tablet Take 1 tablet by mouth as needed for mild constipation.    sertraline (ZOLOFT) 100 MG tablet Take 1.5 tablets (150 mg total) by mouth daily. Qty: 45 tablet, Refills: 3  STOP taking these medications     levofloxacin (LEVAQUIN) 500 MG tablet      LORazepam (ATIVAN) 0.5 MG tablet      metroNIDAZOLE (FLAGYL) 500 MG tablet      predniSONE (DELTASONE) 10 MG tablet          Today    VITAL SIGNS:  Blood pressure 150/61, pulse 72, temperature 97.8 F (36.6 C), temperature source Oral, resp. rate 16, height 5\' 5"  (1.651 m), weight 64.683 kg (142 lb 9.6 oz), SpO2 97 %.  I/O:   Intake/Output Summary (Last 24 hours) at 02/18/15 1017 Last data filed at 02/18/15 0528  Gross per 24 hour  Intake    480 ml  Output   2675 ml  Net  -2195 ml    PHYSICAL EXAMINATION:  Physical Exam  GENERAL:  81 y.o.-year-old patient lying in the bed with no acute distress.  LUNGS: Normal breath sounds bilaterally, no wheezing, rales,rhonchi or crepitation. No use of accessory muscles of respiration.  CARDIOVASCULAR: S1, S2 normal. No murmurs, rubs, or gallops.  ABDOMEN: Soft, non-tender, non-distended. Bowel sounds present. No organomegaly or mass.  NEUROLOGIC: Moves all 4 extremities. PSYCHIATRIC: The patient is alert and oriented x 3.   SKIN: No obvious rash, lesion, or ulcer.   DATA REVIEW:   CBC  Recent Labs Lab 02/15/15 0512  WBC 9.9  HGB 9.4*  HCT 28.7*  PLT 403    Chemistries   Recent Labs Lab 02/18/15 0537  NA 139  K 3.3*  CL 97*  CO2 32  GLUCOSE 110*  BUN 9  CREATININE 0.68  CALCIUM 8.4*    Cardiac Enzymes No results for input(s): TROPONINI in the last 168 hours.  Microbiology Results  Results for orders placed or performed during the hospital encounter of 02/10/15  Culture, blood (routine x 2)     Status: None   Collection Time: 02/10/15  4:35 PM  Result Value Ref Range Status   Specimen Description BLOOD LEFT ARM  Final   Special Requests BOTTLES DRAWN AEROBIC AND ANAEROBIC 2CC  Final   Culture NO GROWTH 5 DAYS  Final   Report Status 02/15/2015 FINAL  Final  Culture, blood (routine x 2)     Status: None   Collection Time: 02/10/15  4:35 PM  Result Value Ref Range Status   Specimen Description BLOOD RIGHT WRIST  Final   Special Requests BOTTLES DRAWN AEROBIC AND ANAEROBIC 4CC  Final   Culture NO GROWTH 5 DAYS  Final   Report Status 02/15/2015 FINAL  Final  MRSA PCR Screening     Status: None   Collection Time: 02/11/15 12:58 AM  Result Value Ref Range Status   MRSA by PCR NEGATIVE NEGATIVE Final    Comment:        The GeneXpert MRSA Assay (FDA approved for NASAL specimens only), is one component of a comprehensive MRSA colonization surveillance program. It is not intended to diagnose MRSA infection nor to guide or monitor treatment for MRSA infections.   Stool culture     Status: None   Collection Time: 02/11/15 12:30 PM  Result Value Ref Range Status   Specimen Description STOOL  Final   Special Requests Normal  Final   Culture   Final    NO SALMONELLA OR SHIGELLA ISOLATED NO CAMPYLOBACTER DETECTED No Pathogenic E. coli detected    Report Status 02/13/2015 FINAL  Final  C difficile quick scan w PCR reflex     Status: None   Collection  Time: 02/11/15 12:30 PM   Result Value Ref Range Status   C Diff antigen NEGATIVE NEGATIVE Final   C Diff toxin NEGATIVE NEGATIVE Final   C Diff interpretation Negative for C. difficile  Final  Urine culture     Status: None   Collection Time: 02/13/15  4:30 PM  Result Value Ref Range Status   Specimen Description URINE, CATHETERIZED  Final   Special Requests Normal  Final   Culture 20,000 COLONIES/mL CANDIDA ALBICANS  Final   Report Status 02/17/2015 FINAL  Final    RADIOLOGY:  No results found.    Follow up with PCP in 1 week.  Management plans discussed with the patient, family and they are in agreement.  CODE STATUS:     Code Status Orders        Start     Ordered   02/10/15 2146  Do not attempt resuscitation (DNR)   Continuous    Question Answer Comment  In the event of cardiac or respiratory ARREST Do not call a "code blue"   In the event of cardiac or respiratory ARREST Do not perform Intubation, CPR, defibrillation or ACLS   In the event of cardiac or respiratory ARREST Use medication by any route, position, wound care, and other measures to relive pain and suffering. May use oxygen, suction and manual treatment of airway obstruction as needed for comfort.      02/10/15 2145    Advance Directive Documentation        Most Recent Value   Type of Advance Directive  Healthcare Power of Attorney, Living will   Pre-existing out of facility DNR order (yellow form or pink MOST form)     "MOST" Form in Place?        TOTAL TIME TAKING CARE OF THIS PATIENT ON DAY OF DISCHARGE: more than 30 minutes.    Hillary Bow R M.D on 02/18/2015 at 10:17 AM  Between 7am to 6pm - Pager - [phone]  After 6pm go to [email or web address] - password [email or web address] [facility]  [facility]  Office  [phone]  CC: Primary care physician; Margarita Rana, MD

## 2015-02-18 NOTE — Discharge Instructions (Signed)
°  DIET:  °Cardiac diet ° °DISCHARGE CONDITION:  °Stable ° °ACTIVITY:  °Activity as tolerated ° °OXYGEN:  °Home Oxygen: Yes.   °  °Oxygen Delivery: 2 liters/min via Patient connected to nasal cannula oxygen ° °DISCHARGE LOCATION:  °nursing home  ° °If you experience worsening of your admission symptoms, develop shortness of breath, life threatening emergency, suicidal or homicidal thoughts you must seek medical attention immediately by calling 911 or calling your MD immediately  if symptoms less severe. ° °You Must read complete instructions/literature along with all the possible adverse reactions/side effects for all the Medicines you take and that have been prescribed to you. Take any new Medicines after you have completely understood and accpet all the possible adverse reactions/side effects.  ° °Please note ° °You were cared for by a hospitalist during your hospital stay. If you have any questions about your discharge medications or the care you received while you were in the hospital after you are discharged, you can call the unit and asked to speak with the hospitalist on call if the hospitalist that took care of you is not available. Once you are discharged, your primary care physician will handle any further medical issues. Please note that NO REFILLS for any discharge medications will be authorized once you are discharged, as it is imperative that you return to your primary care physician (or establish a relationship with a primary care physician if you do not have one) for your aftercare needs so that they can reassess your need for medications and monitor your lab values. ° ° ° °

## 2015-02-18 NOTE — Progress Notes (Signed)
Daughter, Anderson Malta, called and updated on pt's status.

## 2015-02-18 NOTE — Progress Notes (Signed)
EMS called for non emergent transport . 

## 2015-02-18 NOTE — Progress Notes (Signed)
Physical Therapy Treatment Patient Details Name: Chelsea [name] MRN: [id number] DOB: [birth year] Today's Date: 02/18/2015    History of Present Illness Pt is an 81 yo female from [facility] admitted to the hospital for bilateral pneumonia     PT Comments    Pt was seen for evaluation of her tolerance for gait and did make the transition to her chair today.  Her SOB was minor and sats were stable and above 90% for entire session with 3L O2 via nasal cannula.  Will expect SNF to be appropriate care level very short term.  Follow Up Recommendations  SNF     Equipment Recommendations  Rolling walker with 5" wheels    Recommendations for Other Services       Precautions / Restrictions Precautions Precautions: Fall Restrictions Weight Bearing Restrictions: No    Mobility  Bed Mobility Overal bed mobility: Needs Assistance Bed Mobility: Supine to Sit     Supine to sit: Min assist;Mod assist     General bed mobility comments: assist mainly to bring trunk upright  Transfers Overall transfer level: Needs assistance Equipment used: Rolling walker (2 wheeled);1 person hand held assist Transfers: Sit to/from [facility] Sit to Stand: Mod assist Stand pivot transfers: Min assist       General transfer comment: controlling with a slight buckled appearance to LE's.  However, once she started moving was better able to get to chair.  Ambulation/Gait Ambulation/Gait assistance: Min assist Ambulation Distance (Feet): 4 Feet Assistive device: Rolling walker (2 wheeled);1 person hand held assist Gait Pattern/deviations: Step-to pattern;Trunk flexed;Narrow base of support;Shuffle Gait velocity: reduced Gait velocity interpretation: Below normal speed for age/gender     Stairs            Wheelchair Mobility    Modified Rankin (Stroke Patients Only)       Balance Overall balance assessment: Needs assistance Sitting-balance support: Feet  supported;Bilateral upper extremity supported Sitting balance-Leahy Scale: Fair Sitting balance - Comments: reminders to protect IV lines Postural control: Posterior lean Standing balance support: Bilateral upper extremity supported Standing balance-Leahy Scale: Poor Standing balance comment: looks like she will lose her balance but can control with PT and walker                    Cognition Arousal/Alertness: Awake/alert Behavior During Therapy: WFL for tasks assessed/performed Overall Cognitive Status: Within Functional Limits for tasks assessed                      Exercises      General Comments General comments (skin integrity, edema, etc.): Pt has fragile bruised skin on LE's and was given elevation to LE's in chair to alleviate edema and protect her skin      Pertinent Vitals/Pain Pain Assessment: No/denies pain    Home Living                      Prior Function            PT Goals (current goals can now be found in the care plan section) Acute Rehab PT Goals Patient Stated Goal: none stated Progress towards PT goals: Progressing toward goals    Frequency  Min 2X/week    PT Plan Current plan remains appropriate    Co-evaluation             End of Session Equipment Utilized During Treatment: Oxygen Activity Tolerance: Patient tolerated treatment well Patient left: in  chair;with call bell/phone within reach;with chair alarm set     Time: [phone] PT Time Calculation (min) (ACUTE ONLY): 20 min  Charges:  $Gait Training: 8-22 mins $Therapeutic Activity: 8-22 mins                    G Codes:      Ramond Dial 2015-03-05, 11:37 AM   Mee Hives, PT MS Acute Rehab Dept. Number: [facility] [id number] and [place] [phone]

## 2015-02-18 NOTE — Plan of Care (Signed)
Problem: Phase II Progression Outcomes Goal: Wean O2 if indicated Outcome: Progressing On 2L

## 2015-02-18 NOTE — Progress Notes (Signed)
Foley catheter discontinued per md orders, pt to void before discharge.

## 2015-02-18 NOTE — Clinical Social Work Note (Signed)
CSW notified pt, RN and facility that pt would DC today via EMS to [facility].  CSW signing off unless further needs arise

## 2015-02-18 NOTE — Progress Notes (Signed)
EMS here to pick up pt for transfer to [facility]

## 2015-02-18 NOTE — Progress Notes (Signed)
Physical Therapy Treatment Patient Details Name: Chelsea [name] MRN: [id number] DOB: 05-04-1924 Today's Date: 02/18/2015    History of Present Illness Pt is an 81 yo female from [facility] admitted to the hospital for bilateral pneumonia     PT Comments    Note today for there ex as pt has already gotten to chair and too fatigued to try a walk.  Did perform the exercises with good technique, and will need to continue to increase standing endurance and gait tolerance per her PT goals.  Follow Up Recommendations  SNF     Equipment Recommendations  Rolling walker with 5" wheels    Recommendations for Other Services       Precautions / Restrictions Precautions Precautions: Fall Restrictions Weight Bearing Restrictions: No    Mobility  Bed Mobility Overal bed mobility: Needs Assistance Bed Mobility: Supine to Sit     Supine to sit: Min assist;Mod assist     General bed mobility comments: up when PT entered  Transfers Overall transfer level: Needs assistance Equipment used: Rolling walker (2 wheeled);1 person hand held assist Transfers: Sit to/from [facility] Sit to Stand: Mod assist Stand pivot transfers: Min assist       General transfer comment: up when PT entered  Ambulation/Gait Ambulation/Gait assistance: Min assist Ambulation Distance (Feet): 4 Feet Assistive device: Rolling walker (2 wheeled);1 person hand held assist Gait Pattern/deviations: Step-to pattern;Trunk flexed;Narrow base of support;Shuffle Gait velocity: reduced Gait velocity interpretation: Below normal speed for age/gender General Gait Details: declined   Stairs            Wheelchair Mobility    Modified Rankin (Stroke Patients Only)       Balance Overall balance assessment: Needs assistance Sitting-balance support: Feet supported;Bilateral upper extremity supported Sitting balance-Leahy Scale: Fair Sitting balance - Comments: reminders to protect IV  lines Postural control: Posterior lean Standing balance support: Bilateral upper extremity supported Standing balance-Leahy Scale: Poor Standing balance comment: looks like she will lose her balance but can control with PT and walker                    Cognition Arousal/Alertness: Awake/alert Behavior During Therapy: WFL for tasks assessed/performed Overall Cognitive Status: Within Functional Limits for tasks assessed                      Exercises General Exercises - Lower Extremity Ankle Circles/Pumps: AROM;Both;Strengthening;20 reps Quad Sets: AROM;Both;20 reps Gluteal Sets: AROM;Both;10 reps Long Arc Quad: Strengthening;Both;10 reps Heel Slides: Strengthening;Both;10 reps Hip ABduction/ADduction: AROM;Both;10 reps    General Comments General comments (skin integrity, edema, etc.): Pt up in chair and did ther ex to BLE's with resistance, good performance but tired from getting up to chair to take another walk      Pertinent Vitals/Pain Pain Assessment: No/denies pain    Home Living                      Prior Function            PT Goals (current goals can now be found in the care plan section) Acute Rehab PT Goals Patient Stated Goal: none stated Progress towards PT goals: Progressing toward goals    Frequency  Min 2X/week    PT Plan Current plan remains appropriate    Co-evaluation             End of Session Equipment Utilized During Treatment: Oxygen Activity Tolerance: Patient tolerated  treatment well Patient left: in chair;with call bell/phone within reach     Time: 1056-1119 PT Time Calculation (min) (ACUTE ONLY): 23 min  Charges:  $Gait Training: 8-22 mins $Therapeutic Exercise: 23-37 mins $Therapeutic Activity: 8-22 mins                    G Codes:      Ramond Dial 02-27-2015, 11:45 AM   Mee Hives, PT MS Acute Rehab Dept. Number: [facility] [id number] and [place] [phone]

## 2015-02-23 ENCOUNTER — Telehealth: Payer: Self-pay | Admitting: Family Medicine

## 2015-02-23 DIAGNOSIS — I10 Essential (primary) hypertension: Secondary | ICD-10-CM

## 2015-02-23 DIAGNOSIS — F39 Unspecified mood [affective] disorder: Secondary | ICD-10-CM

## 2015-02-23 DIAGNOSIS — J189 Pneumonia, unspecified organism: Secondary | ICD-10-CM

## 2015-02-23 DIAGNOSIS — I2510 Atherosclerotic heart disease of native coronary artery without angina pectoris: Secondary | ICD-10-CM

## 2015-02-23 DIAGNOSIS — J439 Emphysema, unspecified: Secondary | ICD-10-CM

## 2015-02-23 NOTE — Telephone Encounter (Incomplete)
FYI..Called patient. Advised patient of provider's approval for requested procedure, as well as any comments/instructions from provider.   Provided patient w/ verbal instructions concerning pre-, intra- and post-procedure preparation and instructions.  Patient verbalized understanding of the above.   Patient has also been mailed a letter containing the following instructions :{AMB GI [id number]}

## 2015-02-23 NOTE — Telephone Encounter (Signed)
Fyi.  Patient wanted to let you know she was back in the hospital this weekend .  She has an appt on Friday the 9th,  Call back is [phone]  Thanks Con Memos

## 2015-02-26 ENCOUNTER — Inpatient Hospital Stay: Payer: Medicare Other | Admitting: Family Medicine

## 2015-03-01 ENCOUNTER — Encounter: Payer: Medicare Other | Admitting: Physician Assistant

## 2015-03-02 ENCOUNTER — Encounter: Payer: Medicare Other | Admitting: Family Medicine

## 2015-03-09 ENCOUNTER — Telehealth: Payer: Self-pay | Admitting: Family Medicine

## 2015-03-09 NOTE — Telephone Encounter (Signed)
Just FYI Pt is being discharged today 03/09/15 from rehab at [facility] and is scheduled for F/U on 03/19/15 @ 11 am. Thanks TNP

## 2015-03-11 DIAGNOSIS — R262 Difficulty in walking, not elsewhere classified: Secondary | ICD-10-CM | POA: Diagnosis not present

## 2015-03-11 DIAGNOSIS — M6281 Muscle weakness (generalized): Secondary | ICD-10-CM | POA: Diagnosis not present

## 2015-03-11 DIAGNOSIS — J188 Other pneumonia, unspecified organism: Secondary | ICD-10-CM | POA: Diagnosis not present

## 2015-03-15 DIAGNOSIS — R918 Other nonspecific abnormal finding of lung field: Secondary | ICD-10-CM | POA: Diagnosis not present

## 2015-03-15 DIAGNOSIS — J449 Chronic obstructive pulmonary disease, unspecified: Secondary | ICD-10-CM | POA: Diagnosis not present

## 2015-03-15 DIAGNOSIS — J8410 Pulmonary fibrosis, unspecified: Secondary | ICD-10-CM | POA: Diagnosis not present

## 2015-03-16 DIAGNOSIS — J188 Other pneumonia, unspecified organism: Secondary | ICD-10-CM | POA: Diagnosis not present

## 2015-03-16 DIAGNOSIS — R262 Difficulty in walking, not elsewhere classified: Secondary | ICD-10-CM | POA: Diagnosis not present

## 2015-03-16 DIAGNOSIS — M6281 Muscle weakness (generalized): Secondary | ICD-10-CM | POA: Diagnosis not present

## 2015-03-17 DIAGNOSIS — R262 Difficulty in walking, not elsewhere classified: Secondary | ICD-10-CM | POA: Diagnosis not present

## 2015-03-17 DIAGNOSIS — J188 Other pneumonia, unspecified organism: Secondary | ICD-10-CM | POA: Diagnosis not present

## 2015-03-17 DIAGNOSIS — M6281 Muscle weakness (generalized): Secondary | ICD-10-CM | POA: Diagnosis not present

## 2015-03-18 DIAGNOSIS — R262 Difficulty in walking, not elsewhere classified: Secondary | ICD-10-CM | POA: Diagnosis not present

## 2015-03-18 DIAGNOSIS — J188 Other pneumonia, unspecified organism: Secondary | ICD-10-CM | POA: Diagnosis not present

## 2015-03-18 DIAGNOSIS — M6281 Muscle weakness (generalized): Secondary | ICD-10-CM | POA: Diagnosis not present

## 2015-03-19 ENCOUNTER — Encounter: Payer: Self-pay | Admitting: Family Medicine

## 2015-03-19 ENCOUNTER — Ambulatory Visit (INDEPENDENT_AMBULATORY_CARE_PROVIDER_SITE_OTHER): Payer: Medicare Other | Admitting: Family Medicine

## 2015-03-19 VITALS — BP 122/52 | HR 86 | Temp 98.4°F | Resp 18 | Wt 138.8 lb

## 2015-03-19 DIAGNOSIS — G4700 Insomnia, unspecified: Secondary | ICD-10-CM

## 2015-03-19 DIAGNOSIS — R0609 Other forms of dyspnea: Secondary | ICD-10-CM

## 2015-03-19 DIAGNOSIS — I6523 Occlusion and stenosis of bilateral carotid arteries: Secondary | ICD-10-CM

## 2015-03-19 DIAGNOSIS — K219 Gastro-esophageal reflux disease without esophagitis: Secondary | ICD-10-CM

## 2015-03-19 DIAGNOSIS — J449 Chronic obstructive pulmonary disease, unspecified: Secondary | ICD-10-CM

## 2015-03-19 DIAGNOSIS — M545 Low back pain: Secondary | ICD-10-CM | POA: Diagnosis not present

## 2015-03-19 MED ORDER — TIOTROPIUM BROMIDE MONOHYDRATE 18 MCG IN CAPS: 18.0000 ug | ORAL_CAPSULE | Freq: Every day | RESPIRATORY_TRACT | Status: DC

## 2015-03-19 MED ORDER — OMEPRAZOLE 20 MG PO CPDR: 20.0000 mg | DELAYED_RELEASE_CAPSULE | Freq: Every day | ORAL | Status: DC

## 2015-03-19 MED ORDER — LORAZEPAM 0.5 MG PO TABS: 0.5000 mg | ORAL_TABLET | Freq: Every evening | ORAL | Status: DC | PRN

## 2015-03-19 NOTE — Progress Notes (Signed)
Patient ID: Chelsea [name], female   DOB: [birth year], 81 y.o.   MRN: [id number]    Subjective:  HPI  81 yo female here for hospital follow up. Hospitalized for 9 days for pneumonia and sepsis. Then rehab at [facility]. Still undergoing PT. Is having some SOB still and balance still off.   Is still having back pain and getting injections for this. Does also have macular degeneration.  Chronic medical problems appear stable at this time. Back to driving short distances.  Takes her medication without any difficulty. Very good about understanding her medication and medical problems.  Does not remember some of her hospitalization. Was very ill.  Has made a remarkable recovery.   Is overall feeling much better. Really needs her medication reconciled. Brought in all of her bottles. Needs some clarification.     Prior to Admission medications   Medication Sig Start Date End Date Taking? Authorizing Provider  albuterol (PROVENTIL HFA;VENTOLIN HFA) 108 (90 BASE) MCG/ACT inhaler Inhale 2 puffs into the lungs every 6 (six) hours as needed for wheezing or shortness of breath.    Yes Historical Provider, MD  alendronate (FOSAMAX) 70 MG tablet Take 70 mg by mouth once a week.    Yes Historical Provider, MD  amLODipine (NORVASC) 10 MG tablet Take 10 mg by mouth daily.   Yes Historical Provider, MD  aspirin EC 81 MG tablet Take 81 mg by mouth daily.   Yes Historical Provider, MD  atorvastatin (LIPITOR) 80 MG tablet Take 1 tablet (80 mg total) by mouth daily. 11/03/14  Yes Minna Merritts, MD  beclomethasone (BECONASE-AQ) 42 MCG/SPRAY nasal spray Place 1 spray into both nostrils 2 (two) times daily.    Yes Historical Provider, MD  calcium citrate-vitamin D (CITRACAL+D) 315-200 MG-UNIT per tablet Take 1 tablet by mouth daily.   Yes Historical Provider, MD  diazepam (VALIUM) 5 MG tablet Take 1 tablet (5 mg total) by mouth every 12 (twelve) hours as needed for anxiety. 02/18/15  Yes Srikar Sudini, MD  feeding  supplement, ENSURE ENLIVE, (ENSURE ENLIVE) LIQD Take 237 mLs by mouth 2 (two) times daily with a meal. 02/18/15  Yes Srikar Sudini, MD  fluticasone (FLONASE) 50 MCG/ACT nasal spray Place 2 sprays into both nostrils daily.   Yes Historical Provider, MD  Fluticasone Furoate-Vilanterol (BREO ELLIPTA) 100-25 MCG/INH AEPB Inhale 1 puff into the lungs daily.   Yes Historical Provider, MD  furosemide (LASIX) 20 MG tablet Take 1 tablet (20 mg total) by mouth daily. 02/18/15  Yes Srikar Sudini, MD  lansoprazole (PREVACID) 30 MG capsule Take 30 mg by mouth 2 (two) times daily.   Yes Historical Provider, MD  levothyroxine (SYNTHROID, LEVOTHROID) 88 MCG tablet Take 88 mcg by mouth daily before breakfast.   Yes Historical Provider, MD  metaxalone (SKELAXIN) 800 MG tablet Take 1 tablet (800 mg total) by mouth 3 (three) times daily as needed for muscle spasms. 02/18/15  Yes Srikar Sudini, MD  metoprolol succinate (TOPROL-XL) 25 MG 24 hr tablet Take 0.5 tablets (12.5 mg total) by mouth daily. 02/18/15  Yes Srikar Sudini, MD  montelukast (SINGULAIR) 10 MG tablet Take 10 mg by mouth at bedtime.   Yes Historical Provider, MD  Multiple Vitamins-Minerals (PRESERVISION AREDS PO) Take 1 capsule by mouth daily.    Yes Historical Provider, MD  nitroGLYCERIN (NITROSTAT) 0.4 MG SL tablet Place 1 tablet (0.4 mg total) under the tongue every 5 (five) minutes as needed. Patient taking differently: Place 0.4 mg under the tongue  every 5 (five) minutes as needed for chest pain.  08/27/13  Yes Minna Merritts, MD  oxybutynin (DITROPAN) 5 MG tablet Take 5 mg by mouth at bedtime.   Yes Historical Provider, MD  oxyCODONE-acetaminophen (PERCOCET) 10-325 MG per tablet Take 1 tablet by mouth every 6 (six) hours as needed for pain. 02/18/15  Yes Srikar Sudini, MD  potassium chloride (K-DUR) 10 MEQ tablet Take 1 tablet (10 mEq total) by mouth daily. 02/18/15  Yes Srikar Sudini, MD  senna (SENOKOT) 8.6 MG TABS tablet Take 1 tablet by mouth as needed for  mild constipation.   Yes Historical Provider, MD  sertraline (ZOLOFT) 100 MG tablet Take 1.5 tablets (150 mg total) by mouth daily. 01/15/15  Yes Margarita Rana, MD    Patient Active Problem List   Diagnosis Date Noted  . Elevated troponin   . Hypoxia   . SOB (shortness of breath)   . Weakness   . Viral URI   . Pneumonia 02/10/2015  . Enterostenosis 02/09/2015  . Spinal stenosis 02/09/2015  . Peripheral blood vessel disorder 02/09/2015  . 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 OP (osteoporosis) 02/09/2015  . Billowing mitral valve 02/09/2015  . Degeneration macular 02/09/2015  . Hypercholesteremia 02/09/2015  . Adult hypothyroidism 02/09/2015  . Cannot sleep 02/09/2015  . 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 Presence of aortocoronary bypass graft 02/09/2015  . 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 Accumulation of fluid in tissues 02/09/2015  . Esophagitis, reflux 02/09/2015  . Diverticulitis 02/09/2015  . Colon, diverticulosis 02/09/2015  . Constipation due to opioid therapy 02/09/2015  . CAD in native artery 02/09/2015  . Allergic rhinitis 02/09/2015  . Back ache 02/09/2015  . Airway hyperreactivity 02/09/2015  . COPD (chronic obstructive pulmonary disease) 01/15/2015  . GERD (gastroesophageal reflux disease) 12/23/2014  . Spinal stenosis at L4-L5 level 12/16/2014  . Lumbar nerve root compression 12/16/2014  . Compression fracture of lumbar vertebra 12/16/2014  . Lumbar canal stenosis 11/04/2013  . Neuritis or radiculitis due to rupture of lumbar intervertebral disc 11/04/2013  . DDD (degenerative disc disease), lumbar 11/04/2013  . Dermatophytosis of nail 03/31/2013  . Pain in limb 03/31/2013  . Preoperative cardiovascular examination 08/05/2012  . HTN (hypertension) 07/24/2011  . ABDOMINAL PAIN-GENERALIZED 07/21/2010  . Hyperlipidemia 01/31/2010  . DIZZINESS 01/31/2010  . Carotid artery stenosis 12/21/2009  . CORONARY ATHEROSLERO AUTOL VEIN BYPASS GRAFT 05/25/2009  .  Dyspnea on exertion 05/25/2009    Past Medical History  Diagnosis Date  . Coronary artery disease     a. s/p 2 vessel CABG 1989; b. echo 2003: nl; c. cath 2006: patent grafts, EF 65%; d. nuclear stress test 2007: no ischemia, EF 81%  . Hyperlipidemia   . Hypertension   . Bradycardia     hx of it and fatigue with beta blockade  . History of hyperkalemia   . Carotid artery disease     s/p carotid endarterectomy  . Stroke 1980s    left brain  . Mitral valve prolapse   . Osteoporosis   . Asthma   . Degenerative disc disease   . Spinal stenosis in cervical region   . Norovirus 2014    Social History   Social History  . Marital Status: Widowed    Spouse Name: N/A  . Number of Children: N/A  . Years of Education: N/A   Occupational History  . Not on file.   Social History Main Topics  . Smoking status: Former Research scientist (life sciences)  . Smokeless tobacco: Never Used  Comment: quit 50 years ago  . Alcohol Use: No  . Drug Use: No  . Sexual Activity: Not on file   Other Topics Concern  . Not on file   Social History Narrative    Allergies  Allergen Reactions  . Sulfonamide Derivatives Other (See Comments)    Reaction:  Dizziness     Review of Systems  Constitutional: Positive for malaise/fatigue. Negative for fever, chills and weight loss.  Respiratory: Positive for shortness of breath.   Cardiovascular: Negative for chest pain and palpitations.  Genitourinary: Negative for flank pain.  Musculoskeletal: Positive for back pain.  Neurological: Positive for weakness.  Psychiatric/Behavioral: Negative for depression and memory loss.    Immunization History  Administered Date(s) Administered  . Influenza-Unspecified 03/19/2013   Objective:  BP 122/52 mmHg  Pulse 86  Temp(Src) 98.4 F (36.9 C) (Oral)  Resp 18  Wt 138 lb 12.8 oz (62.959 kg)  SpO2 93%  Physical Exam  Constitutional: She is oriented to person, place, and time and well-developed, well-nourished, and in no  distress.  Cardiovascular: Normal rate and regular rhythm.   Pulmonary/Chest: Effort normal. She has rales.  Abdominal: Soft.  Musculoskeletal: She exhibits no edema.  Neurological: She is alert and oriented to person, place, and time.  Skin: Skin is warm.  Psychiatric: Mood, memory, affect and judgment normal.    Lab Results  Component Value Date   WBC 9.9 02/15/2015   HGB 9.4* 02/15/2015   HCT 28.7* 02/15/2015   PLT 403 02/15/2015   GLUCOSE 110* 02/18/2015   CHOL 142 05/08/2014   TRIG 114 05/08/2014   HDL 57 05/08/2014   LDLCALC 62 05/08/2014   INR 1.05 02/10/2015      Assessment and Plan :   1. Chronic obstructive pulmonary disease, unspecified COPD, unspecified chronic bronchitis type Condition is stable. Please continue current medication and  plan of care as noted.   - tiotropium (SPIRIVA HANDIHALER) 18 MCG inhalation capsule; Place 1 capsule (18 mcg total) into inhaler and inhale daily.  Dispense: 90 capsule; Refill: 0  2. Dyspnea on exertion Stable. Continue with PT.   3. Cannot sleep Will fill to take PRN. Does not make unsteady.   - LORazepam (ATIVAN) 0.5 MG tablet; Take 1 tablet (0.5 mg total) by mouth at bedtime as needed for anxiety.  Dispense: 30 tablet; Refill: 1  4. Low back pain without sciatica, unspecified back pain laterality Continue with specialist. Not taking Diazepam or narcotic currently.   5. Gastroesophageal reflux disease, esophagitis presence not specified Will change secondary to formulary.  - omeprazole (PRILOSEC) 20 MG capsule; Take 1 capsule (20 mg total) by mouth daily.  Dispense: 180 capsule; Refill: [facility] 03/19/2015 12:01 PM

## 2015-03-22 ENCOUNTER — Encounter: Payer: Self-pay | Admitting: Podiatry

## 2015-03-22 ENCOUNTER — Ambulatory Visit (INDEPENDENT_AMBULATORY_CARE_PROVIDER_SITE_OTHER): Payer: Medicare Other | Admitting: Podiatry

## 2015-03-22 DIAGNOSIS — M6281 Muscle weakness (generalized): Secondary | ICD-10-CM | POA: Diagnosis not present

## 2015-03-22 DIAGNOSIS — B351 Tinea unguium: Secondary | ICD-10-CM

## 2015-03-22 DIAGNOSIS — R262 Difficulty in walking, not elsewhere classified: Secondary | ICD-10-CM | POA: Diagnosis not present

## 2015-03-22 DIAGNOSIS — J189 Pneumonia, unspecified organism: Secondary | ICD-10-CM | POA: Diagnosis not present

## 2015-03-22 DIAGNOSIS — M79676 Pain in unspecified toe(s): Secondary | ICD-10-CM

## 2015-03-22 DIAGNOSIS — J188 Other pneumonia, unspecified organism: Secondary | ICD-10-CM | POA: Diagnosis not present

## 2015-03-22 NOTE — Progress Notes (Signed)
She presents today after being in the hospital for bilateral pneumonia. She states that her toenails are long and painful and she was unable to attend her last scheduled appointment. Her toes are painful and her nails are elongated.    Objective: vital signs are stable alert and oriented 3. Pulses are palpable bilateral. Her toenails are thick yellow dystrophic clinic with mycotic and painful palpation. Multiple porokeratosis laxity the plantar aspect of the metatarsal heads bilaterally. No open lesions or wounds are noted. Hammertoe deformities are noted bilateral.    assessment: pain in limb secondary to onychomycosis and porokeratosis bilateral.  Plan: debridement of toenails 1 through 5 bilateral cover service secondary to pain debridement of all reactive hyperkeratoses. Follow up with her in 3 months.

## 2015-03-23 ENCOUNTER — Other Ambulatory Visit: Payer: Self-pay | Admitting: Family Medicine

## 2015-03-23 DIAGNOSIS — M6281 Muscle weakness (generalized): Secondary | ICD-10-CM | POA: Diagnosis not present

## 2015-03-23 DIAGNOSIS — J189 Pneumonia, unspecified organism: Secondary | ICD-10-CM | POA: Diagnosis not present

## 2015-03-23 DIAGNOSIS — J188 Other pneumonia, unspecified organism: Secondary | ICD-10-CM | POA: Diagnosis not present

## 2015-03-23 DIAGNOSIS — R262 Difficulty in walking, not elsewhere classified: Secondary | ICD-10-CM | POA: Diagnosis not present

## 2015-03-25 DIAGNOSIS — R262 Difficulty in walking, not elsewhere classified: Secondary | ICD-10-CM | POA: Diagnosis not present

## 2015-03-25 DIAGNOSIS — J188 Other pneumonia, unspecified organism: Secondary | ICD-10-CM | POA: Diagnosis not present

## 2015-03-25 DIAGNOSIS — J189 Pneumonia, unspecified organism: Secondary | ICD-10-CM | POA: Diagnosis not present

## 2015-03-25 DIAGNOSIS — M6281 Muscle weakness (generalized): Secondary | ICD-10-CM | POA: Diagnosis not present

## 2015-03-29 DIAGNOSIS — J189 Pneumonia, unspecified organism: Secondary | ICD-10-CM | POA: Diagnosis not present

## 2015-03-29 DIAGNOSIS — R262 Difficulty in walking, not elsewhere classified: Secondary | ICD-10-CM | POA: Diagnosis not present

## 2015-03-29 DIAGNOSIS — J188 Other pneumonia, unspecified organism: Secondary | ICD-10-CM | POA: Diagnosis not present

## 2015-03-29 DIAGNOSIS — M6281 Muscle weakness (generalized): Secondary | ICD-10-CM | POA: Diagnosis not present

## 2015-03-31 ENCOUNTER — Other Ambulatory Visit: Payer: Self-pay | Admitting: Family Medicine

## 2015-03-31 DIAGNOSIS — J188 Other pneumonia, unspecified organism: Secondary | ICD-10-CM | POA: Diagnosis not present

## 2015-03-31 DIAGNOSIS — R262 Difficulty in walking, not elsewhere classified: Secondary | ICD-10-CM | POA: Diagnosis not present

## 2015-03-31 DIAGNOSIS — M6281 Muscle weakness (generalized): Secondary | ICD-10-CM | POA: Diagnosis not present

## 2015-03-31 DIAGNOSIS — M810 Age-related osteoporosis without current pathological fracture: Secondary | ICD-10-CM

## 2015-03-31 DIAGNOSIS — J189 Pneumonia, unspecified organism: Secondary | ICD-10-CM | POA: Diagnosis not present

## 2015-04-02 DIAGNOSIS — J189 Pneumonia, unspecified organism: Secondary | ICD-10-CM | POA: Diagnosis not present

## 2015-04-02 DIAGNOSIS — R262 Difficulty in walking, not elsewhere classified: Secondary | ICD-10-CM | POA: Diagnosis not present

## 2015-04-02 DIAGNOSIS — J188 Other pneumonia, unspecified organism: Secondary | ICD-10-CM | POA: Diagnosis not present

## 2015-04-02 DIAGNOSIS — M6281 Muscle weakness (generalized): Secondary | ICD-10-CM | POA: Diagnosis not present

## 2015-04-06 ENCOUNTER — Other Ambulatory Visit: Payer: Self-pay | Admitting: Family Medicine

## 2015-04-06 DIAGNOSIS — E039 Hypothyroidism, unspecified: Secondary | ICD-10-CM

## 2015-04-13 DIAGNOSIS — H353132 Nonexudative age-related macular degeneration, bilateral, intermediate dry stage: Secondary | ICD-10-CM | POA: Diagnosis not present

## 2015-04-15 DIAGNOSIS — Z23 Encounter for immunization: Secondary | ICD-10-CM | POA: Diagnosis not present

## 2015-04-19 DIAGNOSIS — M5136 Other intervertebral disc degeneration, lumbar region: Secondary | ICD-10-CM | POA: Diagnosis not present

## 2015-04-19 DIAGNOSIS — M4806 Spinal stenosis, lumbar region: Secondary | ICD-10-CM | POA: Diagnosis not present

## 2015-04-19 DIAGNOSIS — M5416 Radiculopathy, lumbar region: Secondary | ICD-10-CM | POA: Diagnosis not present

## 2015-04-27 DIAGNOSIS — M5416 Radiculopathy, lumbar region: Secondary | ICD-10-CM | POA: Diagnosis not present

## 2015-04-27 DIAGNOSIS — M5136 Other intervertebral disc degeneration, lumbar region: Secondary | ICD-10-CM | POA: Diagnosis not present

## 2015-04-27 DIAGNOSIS — M4806 Spinal stenosis, lumbar region: Secondary | ICD-10-CM | POA: Diagnosis not present

## 2015-04-30 ENCOUNTER — Encounter: Payer: Self-pay | Admitting: Physician Assistant

## 2015-04-30 ENCOUNTER — Ambulatory Visit (INDEPENDENT_AMBULATORY_CARE_PROVIDER_SITE_OTHER): Payer: Medicare Other | Admitting: Physician Assistant

## 2015-04-30 VITALS — BP 140/70 | HR 60 | Temp 97.6°F | Resp 16 | Ht 65.0 in | Wt 142.0 lb

## 2015-04-30 DIAGNOSIS — M810 Age-related osteoporosis without current pathological fracture: Secondary | ICD-10-CM

## 2015-04-30 DIAGNOSIS — Z0000 Encounter for general adult medical examination without abnormal findings: Secondary | ICD-10-CM | POA: Diagnosis not present

## 2015-04-30 DIAGNOSIS — H9193 Unspecified hearing loss, bilateral: Secondary | ICD-10-CM

## 2015-04-30 DIAGNOSIS — I10 Essential (primary) hypertension: Secondary | ICD-10-CM | POA: Diagnosis not present

## 2015-04-30 MED ORDER — AMLODIPINE BESYLATE 10 MG PO TABS: 10.0000 mg | ORAL_TABLET | Freq: Every day | ORAL | Status: DC

## 2015-04-30 NOTE — Progress Notes (Signed)
Patient: Chelsea [name], Female    DOB: 07-Nov-1923, 81 y.o.   MRN: [id number] Visit Date: 04/30/2015  Today's Provider: Mar Daring, PA-C   Chief Complaint  Patient presents with  . Medicare Wellness   Subjective:    Annual wellness visit CHLO… BLEAK is a 81 y.o. female. She feels fairly well. She reports not exercising. She reports she is sleeping fairly well. She reports that she has had her flu vaccine and pneumonia vaccines. These were done at [facility]. She states that her pneumonia vaccine was given to her December of last year. Following the vaccination she did develop pneumonia in January 2016 as well as August 2016. Was hospitalized for 9 days with pneumonia in August.  She does also see a physiatrist as for her degenerative disc disease and chronic low back pain with spinal stenosis. She did recently have an epidural injection approximately 2 weeks ago. These do give her decent relief.  She also has a concern today of hearing loss. She states that she does not feel it is severe but that she has noticed when she is listening to someone from a distance such as a speaker for her pastor she does not hear everything they say and feels like she is missing out on things that she would like to enjoy listening to. She has seen Dr. Beverly Gust in the past and would like to return to his office for a formal audiological testing and consideration of hearing aids. She does not wish to have hearing aids but states that she would rather have hearing aids so that she does not miss out on certain sermons.  She does reside at [facility] She does stay in a duplex by herself. She does not wish to move into the assisted living portion at this time due to her independence. She does have family that comes to visit occasionally. She also has an aide that comes out twice a week to help her with household activities. She also has a nurse  that comes out once a month to help her with medications and to evaluate to see how she is doing.  BMD: 07/15/13 Spine Normal. Left large Leg bone([place]) at the top is Osteopenic and her Left forearm has Osteoporosis. Repeat BMD in 1 year. -----------------------------------------------------------   Review of Systems  Constitutional: Positive for fatigue. Negative for fever, chills, appetite change and unexpected weight change.  HENT: Positive for hearing loss. Negative for congestion, ear discharge, ear pain, rhinorrhea, sinus pressure, sneezing, sore throat, trouble swallowing and voice change.   Eyes: Negative.   Respiratory: Positive for shortness of breath (chronic). Negative for cough, chest tightness and wheezing.   Cardiovascular: Negative.   Gastrointestinal: Positive for diarrhea and constipation (uses miralax). Negative for nausea, vomiting, abdominal pain, blood in stool and abdominal distention.  Endocrine: Positive for cold intolerance. Negative for heat intolerance, polydipsia, polyphagia and polyuria.  Genitourinary: Positive for enuresis. Negative for urgency, frequency, hematuria, flank pain, decreased urine volume, genital sores, pelvic pain and dyspareunia.  Musculoskeletal: Positive for back pain (chronic) and gait problem (ambulates with walker). Negative for myalgias, joint swelling, arthralgias, neck pain and neck stiffness.  Allergic/Immunologic: Positive for environmental allergies. Negative for food allergies and immunocompromised state.  Neurological: Negative.   Hematological: Negative for adenopathy. Bruises/bleeds easily (on ASA).  Psychiatric/Behavioral: Negative.     Social History   Social History  . Marital Status: Widowed    Spouse  Name: N/A  . Number of Children: N/A  . Years of Education: N/A   Occupational History  . Not on file.   Social History Main Topics  . Smoking status: Former Research scientist (life sciences)  . Smokeless tobacco: Never Used     Comment: quit  50 years ago  . Alcohol Use: No  . Drug Use: No  . Sexual Activity: Not on file   Other Topics Concern  . Not on file   Social History Narrative    Patient Active Problem List   Diagnosis Date Noted  . Elevated troponin   . Hypoxia   . SOB (shortness of breath)   . Weakness   . Viral URI   . Pneumonia 02/10/2015  . Enterostenosis ([place]) 02/09/2015  . Spinal stenosis 02/09/2015  . Peripheral blood vessel disorder ([place]) 02/09/2015  . 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 OP (osteoporosis) 02/09/2015  . Billowing mitral valve 02/09/2015  . Degeneration macular 02/09/2015  . Hypercholesteremia 02/09/2015  . Adult hypothyroidism 02/09/2015  . Cannot sleep 02/09/2015  . 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 Presence of aortocoronary bypass graft 02/09/2015  . 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 Accumulation of fluid in tissues 02/09/2015  . Esophagitis, reflux 02/09/2015  . Diverticulitis 02/09/2015  . Colon, diverticulosis 02/09/2015  . Constipation due to opioid therapy 02/09/2015  . CAD in native artery 02/09/2015  . Allergic rhinitis 02/09/2015  . Back ache 02/09/2015  . Airway hyperreactivity 02/09/2015  . COPD (chronic obstructive pulmonary disease) ([place]) 01/15/2015  . GERD (gastroesophageal reflux disease) 12/23/2014  . Spinal stenosis at L4-L5 level 12/16/2014  . Lumbar nerve root compression 12/16/2014  . Compression fracture of lumbar vertebra ([place]) 12/16/2014  . Lumbar canal stenosis 11/04/2013  . Neuritis or radiculitis due to rupture of lumbar intervertebral disc 11/04/2013  . DDD (degenerative disc disease), lumbar 11/04/2013  . Degeneration of intervertebral disc of lumbar region 11/04/2013  . Dermatophytosis of nail 03/31/2013  . Pain in limb 03/31/2013  . Preoperative cardiovascular examination 08/05/2012  . HTN (hypertension) 07/24/2011  . ABDOMINAL PAIN-GENERALIZED 07/21/2010  . Hyperlipidemia 01/31/2010  . DIZZINESS 01/31/2010  . Carotid artery stenosis  12/21/2009  . CORONARY ATHEROSLERO AUTOL VEIN BYPASS GRAFT 05/25/2009  . Dyspnea on exertion 05/25/2009    Past Surgical History  Procedure Laterality Date  . Coronary artery bypass graft    . Carotid endarterectomy      left  . Cholecystectomy    . Abdominal hysterectomy    . Bladder suspension    . Hip surgery  02/2011    right    Her family history includes Stroke in her father; Throat cancer in her mother.    Previous Medications   ALBUTEROL (PROVENTIL HFA;VENTOLIN HFA) 108 (90 BASE) MCG/ACT INHALER    Inhale 2 puffs into the lungs every 6 (six) hours as needed for wheezing or shortness of breath.    ALENDRONATE (FOSAMAX) 70 MG TABLET    take 1 tablet by mouth ONCE A WEEK, IN THE MORNING 30 MINUTES BEFORE EATING. DRINK 8 OUNCES OF WATER WITH MEDICATION AND DO NOT LIE DOWN FOR   ASPIRIN EC 81 MG TABLET    Take 81 mg by mouth daily.   ATORVASTATIN (LIPITOR) 80 MG TABLET    Take 1 tablet (80 mg total) by mouth daily.   CALCIUM CITRATE-VITAMIN D (CITRACAL+D) 315-200 MG-UNIT PER TABLET    Take 1 tablet by mouth daily.   FEEDING SUPPLEMENT, ENSURE ENLIVE, (ENSURE ENLIVE) LIQD    Take 237 mLs by  mouth 2 (two) times daily with a meal.   FLUTICASONE (FLONASE) 50 MCG/ACT NASAL SPRAY    Place 2 sprays into both nostrils daily.   FUROSEMIDE (LASIX) 20 MG TABLET    Take 1 tablet (20 mg total) by mouth daily.   LEVOTHYROXINE (SYNTHROID, LEVOTHROID) 88 MCG TABLET    take 1 tablet by mouth once daily   METAXALONE (SKELAXIN) 800 MG TABLET    Take 1 tablet (800 mg total) by mouth 3 (three) times daily as needed for muscle spasms.   MONTELUKAST (SINGULAIR) 10 MG TABLET    Take 10 mg by mouth at bedtime.   MULTIPLE VITAMINS-MINERALS (PRESERVISION AREDS PO)    Take 1 capsule by mouth daily.    NITROGLYCERIN (NITROSTAT) 0.4 MG SL TABLET    Place 1 tablet (0.4 mg total) under the tongue every 5 (five) minutes as needed.   OMEPRAZOLE (PRILOSEC) 20 MG CAPSULE    Take 1 capsule (20 mg total) by mouth  daily.   OXYBUTYNIN (DITROPAN) 5 MG TABLET    Take 5 mg by mouth at bedtime.   OXYCODONE-ACETAMINOPHEN (PERCOCET) 10-325 MG PER TABLET    Take 1 tablet by mouth every 6 (six) hours as needed for pain.   POTASSIUM CHLORIDE (K-DUR) 10 MEQ TABLET    Take 1 tablet (10 mEq total) by mouth daily.   SENNA (SENOKOT) 8.6 MG TABS TABLET    Take 1 tablet by mouth as needed for mild constipation.   SERTRALINE (ZOLOFT) 100 MG TABLET    Take 1.5 tablets (150 mg total) by mouth daily.   TIOTROPIUM (SPIRIVA HANDIHALER) 18 MCG INHALATION CAPSULE    Place 1 capsule (18 mcg total) into inhaler and inhale daily.    Patient Care Team: Margarita Rana, MD as PCP - General (Family Medicine)     Objective:   Vitals: BP 140/70 mmHg  Pulse 60  Temp(Src) 97.6 F (36.4 C) (Oral)  Resp 16  Ht 5\' 5"  (1.651 m)  Wt 142 lb (64.411 kg)  BMI 23.63 kg/m2  Physical Exam  Constitutional: She is oriented to person, place, and time. She appears well-developed and well-nourished. No distress.  HENT:  Head: Normocephalic and atraumatic.  Right Ear: Tympanic membrane, external ear and ear canal normal. Decreased hearing is noted.  Left Ear: Tympanic membrane, external ear and ear canal normal. Decreased hearing is noted.  Nose: Nose normal.  Mouth/Throat: Uvula is midline, oropharynx is clear and moist and mucous membranes are normal. No oropharyngeal exudate.  Eyes: Conjunctivae and EOM are normal. Pupils are equal, round, and reactive to light. Right eye exhibits no discharge. Left eye exhibits no discharge. No scleral icterus.  Neck: Normal range of motion. Neck supple. No JVD present. No tracheal deviation present. No thyromegaly present.  Cardiovascular: Normal rate, regular rhythm, normal heart sounds and intact distal pulses.  Exam reveals no gallop and no friction rub.   No murmur heard. Pulmonary/Chest: Effort normal and breath sounds normal. No respiratory distress. She has no wheezes. She has no rales. She  exhibits no tenderness.  Abdominal: Soft. Bowel sounds are normal. She exhibits no distension and no mass. There is no tenderness. There is no rebound and no guarding.  Musculoskeletal: Normal range of motion. She exhibits no edema or tenderness.  Lymphadenopathy:    She has no cervical adenopathy.  Neurological: She is alert and oriented to person, place, and time.  Skin: Skin is warm and dry. No rash noted. She is not diaphoretic.  Psychiatric: She  has a normal mood and affect. Her behavior is normal. Judgment and thought content normal.  Vitals reviewed.   Activities of Daily Living In your present state of health, do you have any difficulty performing the following activities: 04/30/2015 02/11/2015  Hearing? Y N  Vision? N N  Difficulty concentrating or making decisions? Y N  Walking or climbing stairs? Y Y  Dressing or bathing? N N  Doing errands, shopping? N N    Fall Risk Assessment Fall Risk  04/30/2015  Falls in the past year? No     Depression Screen PHQ 2/9 Scores 04/30/2015  PHQ - 2 Score 2  PHQ- 9 Score 4    Cognitive Testing - 6-CIT  Correct? Score   What year is it? yes 0 0 or 4  What month is it? yes 0 0 or 3  Memorize:    Pia Mau,  44,  [address],  [place],      What time is it? (within 1 hour) yes 0 0 or 3  Count backwards from 20 yes 0 0, 2, or 4  Name the months of the year yes 0 0, 2, or 4  Repeat name & address above no 10 0, 2, 4, 6, 8, or 10       TOTAL SCORE  10/28   Interpretation:  Abnormal- ok for age  Normal (0-7) Abnormal (8-28)       Assessment & Plan:     Annual Wellness Visit  Reviewed patient's Family Medical History Reviewed and updated list of patient's medical providers Assessment of cognitive impairment was done Assessed patient's functional ability Established a written schedule for health screening [place] Completed and Reviewed  Exercise Activities and Dietary recommendations Goals     None      Immunization History  Administered Date(s) Administered  . Influenza-Unspecified 03/19/2013    Health Maintenance  Topic Date Due  . TETANUS/TDAP  01/28/1945  . ZOSTAVAX  01/28/1986  . DEXA SCAN  01/29/1991  . PNA vac Low Risk Adult (1 of 2 - PCV13) 01/29/1991  . INFLUENZA VACCINE  01/18/2015   1. Medicare annual wellness visit, subsequent  2. Hearing loss, bilateral Will refer back to Dr. Tami Ribas for audiological testing and consideration of hearing aids. - Ambulatory referral to ENT  3. Osteoporosis Most recent bone density was January 2015. She was found to have osteopenia of the femur and osteoporosis of the forearm. It was recommended at that time to have a repeat bone mineral density done in one year which was not done. We will get that at this time. I will follow-up with her pending the results of the bone density scan. - DG Bone Density; Future  4. Essential hypertension Long-standing history of hypertension and CAD. She is followed by Dr. Rockey Situ. I will check her CBC and CMP as they have not been done recently to make sure that her kidney function is still stable. She is going to be restarting her amlodipine for better control of her hypertension per Dr. Venia Minks. This medication was refilled as below. She will have her home health aide check her blood pressure when she comes back out for reevaluation. She will call the office with results. I will follow-up with her pending these results. - CBC with Differential - Comprehensive Metabolic Panel (CMET) - amLODipine (NORVASC) 10 MG tablet; Take 1 tablet (10 mg total) by mouth daily.  Dispense: 90 tablet; Refill: 3  Discussed health benefits of physical activity, and  encouraged her to engage in regular exercise appropriate for her age and condition.    ------------------------------------------------------------------------------------------------------------

## 2015-05-01 LAB — CBC WITH DIFFERENTIAL/PLATELET
Basophils Absolute: 0.1 10*3/uL (ref 0.0–0.2)
Basos: 1 %
EOS (ABSOLUTE): 0.1 10*3/uL (ref 0.0–0.4)
Eos: 2 %
Hematocrit: 35.6 % (ref 34.0–46.6)
Hemoglobin: 11.3 g/dL (ref 11.1–15.9)
IMMATURE GRANULOCYTES: 0 %
Immature Grans (Abs): 0 10*3/uL (ref 0.0–0.1)
Lymphocytes Absolute: 2 10*3/uL (ref 0.7–3.1)
Lymphs: 26 %
MCH: 27.6 pg (ref 26.6–33.0)
MCHC: 31.7 g/dL (ref 31.5–35.7)
MCV: 87 fL (ref 79–97)
MONOS ABS: 0.6 10*3/uL (ref 0.1–0.9)
Monocytes: 8 %
NEUTROS PCT: 63 %
Neutrophils Absolute: 4.9 10*3/uL (ref 1.4–7.0)
PLATELETS: 357 10*3/uL (ref 150–379)
RBC: 4.1 x10E6/uL (ref 3.77–5.28)
RDW: 15.9 % — AB (ref 12.3–15.4)
WBC: 7.7 10*3/uL (ref 3.4–10.8)

## 2015-05-01 LAB — COMPREHENSIVE METABOLIC PANEL
ALT: 15 IU/L (ref 0–32)
AST: 26 IU/L (ref 0–40)
Albumin/Globulin Ratio: 1.8 (ref 1.1–2.5)
Albumin: 4.3 g/dL (ref 3.5–4.7)
Alkaline Phosphatase: 98 IU/L (ref 39–117)
BUN/Creatinine Ratio: 37 — ABNORMAL HIGH (ref 11–26)
BUN: 35 mg/dL — ABNORMAL HIGH (ref 8–27)
Bilirubin Total: 0.3 mg/dL (ref 0.0–1.2)
CALCIUM: 9.2 mg/dL (ref 8.7–10.3)
CO2: 18 mmol/L (ref 18–29)
CREATININE: 0.94 mg/dL (ref 0.57–1.00)
Chloride: 103 mmol/L (ref 97–106)
GFR calc Af Amer: 62 mL/min/{1.73_m2} (ref >59)
GFR, EST NON AFRICAN AMERICAN: 54 mL/min/{1.73_m2} — AB (ref >59)
Globulin, Total: 2.4 g/dL (ref 1.5–4.5)
Glucose: 100 mg/dL — ABNORMAL HIGH (ref 65–99)
POTASSIUM: 5.3 mmol/L — AB (ref 3.5–5.2)
Sodium: 137 mmol/L (ref 136–144)
Total Protein: 6.7 g/dL (ref 6.0–8.5)

## 2015-05-03 ENCOUNTER — Telehealth: Payer: Self-pay

## 2015-05-03 NOTE — Telephone Encounter (Signed)
Patient advised as directed below.  Thanks,  -Caroll Weinheimer 

## 2015-05-03 NOTE — Telephone Encounter (Signed)
-----   Message from Mar Daring, [place] sent at 05/03/2015  1:54 PM EST ----- All labs are within normal limits and stable.  Thanks! -JB

## 2015-05-11 ENCOUNTER — Other Ambulatory Visit: Payer: Self-pay | Admitting: Family Medicine

## 2015-05-11 ENCOUNTER — Ambulatory Visit (INDEPENDENT_AMBULATORY_CARE_PROVIDER_SITE_OTHER): Payer: Medicare Other | Admitting: Cardiovascular Disease

## 2015-05-11 ENCOUNTER — Encounter: Payer: Self-pay | Admitting: Cardiovascular Disease

## 2015-05-11 VITALS — BP 150/60 | Resp 16 | Ht 65.0 in | Wt 143.5 lb

## 2015-05-11 DIAGNOSIS — I25719 Atherosclerosis of autologous vein coronary artery bypass graft(s) with unspecified angina pectoris: Secondary | ICD-10-CM | POA: Diagnosis not present

## 2015-05-11 DIAGNOSIS — I10 Essential (primary) hypertension: Secondary | ICD-10-CM | POA: Diagnosis not present

## 2015-05-11 DIAGNOSIS — I2510 Atherosclerotic heart disease of native coronary artery without angina pectoris: Secondary | ICD-10-CM | POA: Diagnosis not present

## 2015-05-11 DIAGNOSIS — J449 Chronic obstructive pulmonary disease, unspecified: Secondary | ICD-10-CM | POA: Diagnosis not present

## 2015-05-11 DIAGNOSIS — E7800 Pure hypercholesterolemia, unspecified: Secondary | ICD-10-CM

## 2015-05-11 DIAGNOSIS — I6523 Occlusion and stenosis of bilateral carotid arteries: Secondary | ICD-10-CM

## 2015-05-11 DIAGNOSIS — N3281 Overactive bladder: Secondary | ICD-10-CM

## 2015-05-11 DIAGNOSIS — R0602 Shortness of breath: Secondary | ICD-10-CM

## 2015-05-11 DIAGNOSIS — J8410 Pulmonary fibrosis, unspecified: Secondary | ICD-10-CM | POA: Insufficient documentation

## 2015-05-11 MED ORDER — FUROSEMIDE 20 MG PO TABS: 20.0000 mg | ORAL_TABLET | Freq: Every day | ORAL | Status: DC | PRN

## 2015-05-11 MED ORDER — POTASSIUM CHLORIDE ER 10 MEQ PO TBCR: 10.0000 meq | EXTENDED_RELEASE_TABLET | Freq: Every day | ORAL | Status: DC | PRN

## 2015-05-11 NOTE — Assessment & Plan Note (Signed)
Remote history of bypass surgery Again as above, we'll need to consider coronary disease workup if symptoms of shortness of breath persists after trial of Lasix

## 2015-05-11 NOTE — Assessment & Plan Note (Signed)
Cholesterol is at goal on the current lipid regimen. No changes to the medications were made.  

## 2015-05-11 NOTE — Progress Notes (Signed)
Patient ID: Chelsea [name], female    DOB: 01/08/1924, 81 y.o.   MRN: [id number]  HPI Comments: 81 year-old woman with CAD s/p CABG, diastolic dysfunction, carotid endarterectomy on the left 10 years ago, now with 40-59% carotid disease , mildly dilated left atrium, mild MR, presenting today for follow-up evaluation of her carotid arterial disease,  40 to 59% b/l carotid disease  ECHO 02/11/15 normal study SOB since d/c from hospital, not getting worse, but still not back to baseline Worse with walking Sees Dr. Raul Del in Feb 27th  2017 PNA in 01/2015, also with pleural effusion on CXR  denies any cough or sputum production  has a rattle in her chest, seems to be coming from the left  no chest pain concerning for angina,  Walks with a walker   EKG on today's visit shows sinus bradycardia with rate 58 bpm, no significant ST or T-wave changes   Other past medical history Chronic severe back pain from spinal stenosis. Previous problems with constipation, in the hospital January 2016, CT scan confirming constipation  dizziness in the past, h/o fall with right hip fracture, right arm fracture, 3 ribs injured. She had a long recovery in [place] where the injury occurred. She is now back at [facility] [place]. Previous upper respiratory infection. 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 She has had significant pain, cortisone shots x3 to her hip now wearing a TENS unit for pain. She reports having moderate spinal stenosis.  several admissions to the hospital for abdominal pain. One was in late December 2013, any in February 2014 .  Weight was 147 pounds September 2013, down to 138 pounds in February 2014, down to 131 pounds. Weight has improved on today's visit now up to 143 pounds   hip surgery in March 2014. She had a complicated recovery with bronchitis requiring long period of rehabilitation.  She walks with a cane. Continues to have rare abdominal pain. She did take laxatives for a short  period of time, now has frequent bowel movements without laxatives.  She reports having hypoxia that her visit with  family practice. Ambulation in the office today showed saturations maintained in the mid 90s. Minimal prior smoking history She does have chronic mild shortness of breath. Continues to have significant back pain, leg pain She would like to start an exercise program at [facility] [place]  Previous echo in 05/2009 showed normal LV function without valvular abnormalities   stress test was negative for ischemia.   carotid ultrasound last year showed mild bilateral disease  Allergies  Allergen Reactions  . Sulfa Antibiotics     Other reaction(s): Dizziness  . Sulfonamide Derivatives Other (See Comments)    Reaction:  Dizziness     Current Outpatient Prescriptions on File Prior to Visit  Medication Sig Dispense Refill  . albuterol (PROVENTIL HFA;VENTOLIN HFA) 108 (90 BASE) MCG/ACT inhaler Inhale 2 puffs into the lungs every 6 (six) hours as needed for wheezing or shortness of breath.     Marland Kitchen alendronate (FOSAMAX) 70 MG tablet take 1 tablet by mouth ONCE A WEEK, IN THE MORNING 30 MINUTES BEFORE EATING. DRINK 8 OUNCES OF WATER WITH MEDICATION AND DO NOT LIE DOWN FOR 12 tablet 3  . amLODipine (NORVASC) 10 MG tablet Take 1 tablet (10 mg total) by mouth daily. 90 tablet 3  . aspirin EC 81 MG tablet Take 81 mg by mouth daily.    Marland Kitchen atorvastatin (LIPITOR) 80 MG tablet Take 1 tablet (  80 mg total) by mouth daily. 90 tablet 4  . calcium citrate-vitamin D (CITRACAL+D) 315-200 MG-UNIT per tablet Take 1 tablet by mouth daily.    . feeding supplement, ENSURE ENLIVE, (ENSURE ENLIVE) LIQD Take 237 mLs by mouth 2 (two) times daily with a meal. 237 mL 12  . fluticasone (FLONASE) 50 MCG/ACT nasal spray Place 2 sprays into both nostrils daily.    . furosemide (LASIX) 20 MG tablet Take 1 tablet (20 mg total) by mouth daily. 15 tablet 0  . levothyroxine (SYNTHROID, LEVOTHROID) 88 MCG tablet take 1  tablet by mouth once daily 90 tablet 3  . metaxalone (SKELAXIN) 800 MG tablet Take 1 tablet (800 mg total) by mouth 3 (three) times daily as needed for muscle spasms. 15 tablet 0  . montelukast (SINGULAIR) 10 MG tablet Take 10 mg by mouth at bedtime.    . Multiple Vitamins-Minerals (PRESERVISION AREDS PO) Take 1 capsule by mouth daily.     . nitroGLYCERIN (NITROSTAT) 0.4 MG SL tablet Place 1 tablet (0.4 mg total) under the tongue every 5 (five) minutes as needed. 25 tablet 6  . omeprazole (PRILOSEC) 20 MG capsule Take 1 capsule (20 mg total) by mouth daily. 180 capsule 3  . oxybutynin (DITROPAN) 5 MG tablet Take 5 mg by mouth at bedtime.    Marland Kitchen oxyCODONE-acetaminophen (PERCOCET) 10-325 MG per tablet Take 1 tablet by mouth every 6 (six) hours as needed for pain. 20 tablet 0  . potassium chloride (K-DUR) 10 MEQ tablet Take 1 tablet (10 mEq total) by mouth daily. 15 tablet 0  . senna (SENOKOT) 8.6 MG TABS tablet Take 1 tablet by mouth as needed for mild constipation.    . sertraline (ZOLOFT) 100 MG tablet Take 1.5 tablets (150 mg total) by mouth daily. 45 tablet 3  . tiotropium (SPIRIVA HANDIHALER) 18 MCG inhalation capsule Place 1 capsule (18 mcg total) into inhaler and inhale daily. 90 capsule 0   No current facility-administered medications on file prior to visit.    Past Medical History  Diagnosis Date  . Coronary artery disease     a. s/p 2 vessel CABG 1989; b. echo 2003: nl; c. cath 2006: patent grafts, EF 65%; d. nuclear stress test 2007: no ischemia, EF 81%  . Hyperlipidemia   . Hypertension   . Bradycardia     hx of it and fatigue with beta blockade  . History of hyperkalemia   . Carotid artery disease ([place])     s/p carotid endarterectomy  . Stroke [facility]) 1980s    left brain  . Mitral valve prolapse   . Osteoporosis   . Asthma   . Degenerative disc disease   . Spinal stenosis in cervical region   . Norovirus 2014    Past Surgical History  Procedure Laterality Date  .  Coronary artery bypass graft    . Carotid endarterectomy      left  . Cholecystectomy    . Abdominal hysterectomy    . Bladder suspension    . Hip surgery  02/2011    right    Social History  reports that she has quit smoking. She has never used smokeless tobacco. She reports that she does not drink alcohol or use illicit drugs.  Family History family history includes Stroke in her father; Throat cancer in her mother.  Review of Systems  Respiratory: Positive for shortness of breath.   Cardiovascular: Negative.   Gastrointestinal: Negative.   Musculoskeletal: Positive for back pain and gait  problem.  Skin: Negative.   Neurological: Positive for weakness.  Hematological: Negative.   Psychiatric/Behavioral: Negative.   All other systems reviewed and are negative.   BP 150/60 mmHg  Resp 16  Ht 5\' 5"  (1.651 m)  Wt 143 lb 8 oz (65.091 kg)  BMI 23.88 kg/m2  Physical Exam  Constitutional: She is oriented to person, place, and time. She appears well-developed and well-nourished.  HENT:  Head: Normocephalic.  Nose: Nose normal.  Mouth/Throat: Oropharynx is clear and moist.  Eyes: Conjunctivae are normal. Pupils are equal, round, and reactive to light.  Neck: Normal range of motion. Neck supple. No JVD present.  Cardiovascular: Normal rate, regular rhythm, S1 normal, S2 normal, normal heart sounds and intact distal pulses.  Exam reveals no gallop and no friction rub.   No murmur heard. Trace lower extremity edema  Pulmonary/Chest: Effort normal. No respiratory distress. She has decreased breath sounds in the left lower field. She has no wheezes. She has rales. She exhibits no tenderness.  Rales worse at the left base, also middle left  Abdominal: Soft. Bowel sounds are normal. She exhibits no distension. There is no tenderness.  Musculoskeletal: Normal range of motion. She exhibits no edema or tenderness.  Lymphadenopathy:    She has no cervical adenopathy.  Neurological: She  is alert and oriented to person, place, and time. Coordination normal.  Skin: Skin is warm and dry. No rash noted. No erythema.  Psychiatric: She has a normal mood and affect. Her behavior is normal. Judgment and thought content normal.    Assessment and Plan  Nursing note and vitals reviewed.

## 2015-05-11 NOTE — Assessment & Plan Note (Signed)
Symptoms of shortness of breath less likely ischemia given the rails appreciated on the left Unable to definitively exclude ischemia, We'll focus on pulmonary issues for now If no improvement of symptoms, could have her complete stress test or catheterization

## 2015-05-11 NOTE — Assessment & Plan Note (Signed)
Severe underlying shortness of breath, also with underlying pulmonary fibrosis notable in the left base Rales extending up to the mid lung field on the left No symptoms concerning for bronchitis, right side is relatively clear Unable to exclude some component of chronic diastolic CHF Recommended she start Lasix with potassium sparingly for symptoms of shortness of breath If no improvement in her symptoms, will refer back to pulmonary. May benefit from a course of prednisone

## 2015-05-11 NOTE — Patient Instructions (Addendum)
For your shortness of breath, Take lasix/furosemide as needed with potassium Try not to take everyday, can cause dehydration  Call the office if symptoms do not improve in the next few weeks  Please call us if you have new issues that need to be addressed before your next appt.  Your physician wants you to follow-up in: 3 months.

## 2015-05-11 NOTE — Assessment & Plan Note (Signed)
Followed by Dr. Raul Del. Never really recovered after pneumonia August 2016, has continued to have issues, significant shortness of breath Follow-up with Dr. Raul Del is several months out, recommended she try to call for earlier appointment

## 2015-05-17 ENCOUNTER — Other Ambulatory Visit: Payer: Self-pay

## 2015-05-17 ENCOUNTER — Ambulatory Visit (INDEPENDENT_AMBULATORY_CARE_PROVIDER_SITE_OTHER): Payer: Medicare Other | Admitting: Family Medicine

## 2015-05-17 ENCOUNTER — Encounter: Payer: Self-pay | Admitting: Family Medicine

## 2015-05-17 ENCOUNTER — Ambulatory Visit
Admission: RE | Admit: 2015-05-17 | Discharge: 2015-05-17 | Disposition: A | Discharge status: Home / Self Care | Payer: Medicare Other | Source: Ambulatory Visit | Attending: Family Medicine | Admitting: Family Medicine

## 2015-05-17 VITALS — BP 162/70 | HR 69 | Temp 98.6°F | Resp 16 | Wt 144.6 lb

## 2015-05-17 DIAGNOSIS — Z8701 Personal history of pneumonia (recurrent): Secondary | ICD-10-CM

## 2015-05-17 DIAGNOSIS — I517 Cardiomegaly: Secondary | ICD-10-CM | POA: Insufficient documentation

## 2015-05-17 DIAGNOSIS — I6523 Occlusion and stenosis of bilateral carotid arteries: Secondary | ICD-10-CM

## 2015-05-17 DIAGNOSIS — R0989 Other specified symptoms and signs involving the circulatory and respiratory systems: Secondary | ICD-10-CM | POA: Insufficient documentation

## 2015-05-17 DIAGNOSIS — R05 Cough: Secondary | ICD-10-CM | POA: Diagnosis not present

## 2015-05-17 DIAGNOSIS — K449 Diaphragmatic hernia without obstruction or gangrene: Secondary | ICD-10-CM | POA: Diagnosis not present

## 2015-05-17 MED ORDER — AMOXICILLIN 875 MG PO TABS: 875.0000 mg | ORAL_TABLET | Freq: Two times a day (BID) | ORAL | Status: DC

## 2015-05-17 MED ORDER — PREDNISONE 5 MG PO TABS: ORAL_TABLET | ORAL | Status: DC

## 2015-05-17 NOTE — Progress Notes (Signed)
Patient ID: Chelsea [name], female   DOB: 17-Dec-1923, 81 y.o.   MRN: [id number] Name: Chelsea [name]   MRN: [id number]    DOB: [birth year]   Date:05/17/2015       Progress Note  Subjective  Chief Complaint  Chief Complaint  Patient presents with  . URI    URI  This is a new problem. The current episode started in the past 7 days (Friday 05-14-15 night). Progression since onset: Went to see cardiologist 05-11-15 and was told she had some rales in the left lung. The maximum temperature recorded prior to her arrival was 101 - 101.9 F (05-14-15). Associated symptoms include congestion, coughing, ear pain, rhinorrhea, sneezing and a sore throat. Pertinent negatives include no wheezing. Associated symptoms comments: Ticklish cough without sputum production.. She has tried inhaler use for the symptoms.  Dr. Rockey Situ (cardiologist) felt she may need a prednisone taper with antibiotic since her follow up appointment with Dr. Raul Del won't be until Feb. 2017.  Past Surgical History  Procedure Laterality Date  . Coronary artery bypass graft    . Carotid endarterectomy      left  . Cholecystectomy    . Abdominal hysterectomy    . Bladder suspension    . Hip surgery  02/2011    right   Family History  Problem Relation Age of Onset  . Throat cancer Mother   . Stroke Father     Past Medical History  Diagnosis Date  . Coronary artery disease     a. s/p 2 vessel CABG 1989; b. echo 2003: nl; c. cath 2006: patent grafts, EF 65%; d. nuclear stress test 2007: no ischemia, EF 81%  . Hyperlipidemia   . Hypertension   . Bradycardia     hx of it and fatigue with beta blockade  . History of hyperkalemia   . Carotid artery disease ([place])     s/p carotid endarterectomy  . Stroke [facility]) 1980s    left brain  . Mitral valve prolapse   . Osteoporosis   . Asthma   . Degenerative disc disease   . Spinal stenosis in cervical region   . Norovirus 2014    Social History  Substance Use Topics  .  Smoking status: Former Research scientist (life sciences)  . Smokeless tobacco: Never Used     Comment: quit 50 years ago  . Alcohol Use: No    Current outpatient prescriptions:  .  albuterol (PROVENTIL HFA;VENTOLIN HFA) 108 (90 BASE) MCG/ACT inhaler, Inhale 2 puffs into the lungs every 6 (six) hours as needed for wheezing or shortness of breath. , Disp: , Rfl:  .  alendronate (FOSAMAX) 70 MG tablet, take 1 tablet by mouth ONCE A WEEK, IN THE MORNING 30 MINUTES BEFORE EATING. DRINK 8 OUNCES OF WATER WITH MEDICATION AND DO NOT LIE DOWN FOR, Disp: 12 tablet, Rfl: 3 .  amLODipine (NORVASC) 10 MG tablet, Take 1 tablet (10 mg total) by mouth daily., Disp: 90 tablet, Rfl: 3 .  aspirin EC 81 MG tablet, Take 81 mg by mouth daily., Disp: , Rfl:  .  atorvastatin (LIPITOR) 80 MG tablet, Take 1 tablet (80 mg total) by mouth daily., Disp: 90 tablet, Rfl: 4 .  calcium citrate-vitamin D (CITRACAL+D) 315-200 MG-UNIT per tablet, Take 1 tablet by mouth daily., Disp: , Rfl:  .  feeding supplement, ENSURE ENLIVE, (ENSURE ENLIVE) LIQD, Take 237 mLs by mouth 2 (two) times daily with a meal., Disp: 237 mL, Rfl: 12 .  fluticasone (FLONASE) 50 MCG/ACT  nasal spray, Place 2 sprays into both nostrils daily., Disp: , Rfl:  .  Fluticasone Furoate-Vilanterol (BREO ELLIPTA) 100-25 MCG/INH AEPB, Inhale 2 mcg into the lungs every morning., Disp: , Rfl:  .  furosemide (LASIX) 20 MG tablet, Take 1 tablet (20 mg total) by mouth daily as needed., Disp: 30 tablet, Rfl: 6 .  levothyroxine (SYNTHROID, LEVOTHROID) 88 MCG tablet, take 1 tablet by mouth once daily, Disp: 90 tablet, Rfl: 3 .  metaxalone (SKELAXIN) 800 MG tablet, Take 1 tablet (800 mg total) by mouth 3 (three) times daily as needed for muscle spasms., Disp: 15 tablet, Rfl: 0 .  montelukast (SINGULAIR) 10 MG tablet, Take 10 mg by mouth at bedtime., Disp: , Rfl:  .  Multiple Vitamins-Minerals (PRESERVISION AREDS PO), Take 1 capsule by mouth daily. , Disp: , Rfl:  .  nitroGLYCERIN (NITROSTAT) 0.4 MG SL  tablet, Place 1 tablet (0.4 mg total) under the tongue every 5 (five) minutes as needed., Disp: 25 tablet, Rfl: 6 .  omeprazole (PRILOSEC) 20 MG capsule, Take 1 capsule (20 mg total) by mouth daily., Disp: 180 capsule, Rfl: 3 .  oxybutynin (DITROPAN) 5 MG tablet, take 1 tablet by mouth at bedtime, Disp: 30 tablet, Rfl: 2 .  oxyCODONE-acetaminophen (PERCOCET) 10-325 MG per tablet, Take 1 tablet by mouth every 6 (six) hours as needed for pain., Disp: 20 tablet, Rfl: 0 .  potassium chloride (K-DUR) 10 MEQ tablet, Take 1 tablet (10 mEq total) by mouth daily as needed (take w/ lasix)., Disp: 30 tablet, Rfl: 6 .  senna (SENOKOT) 8.6 MG TABS tablet, Take 1 tablet by mouth as needed for mild constipation., Disp: , Rfl:  .  sertraline (ZOLOFT) 100 MG tablet, Take 1.5 tablets (150 mg total) by mouth daily., Disp: 45 tablet, Rfl: 3 .  tiotropium (SPIRIVA HANDIHALER) 18 MCG inhalation capsule, Place 1 capsule (18 mcg total) into inhaler and inhale daily., Disp: 90 capsule, Rfl: 0  Allergies  Allergen Reactions  . Sulfa Antibiotics     Other reaction(s): Dizziness  . Sulfonamide Derivatives Other (See Comments)    Reaction:  Dizziness     Review of Systems  Constitutional: Positive for fever and chills.  HENT: Positive for congestion, ear pain, rhinorrhea, sneezing and sore throat.   Eyes: Negative.   Respiratory: Positive for cough. Negative for wheezing.   Cardiovascular: Negative.   Gastrointestinal: Negative.   Genitourinary: Negative.   Musculoskeletal: Negative.   Skin: Negative.   Neurological: Negative.   Endo/Heme/Allergies: Negative.   Psychiatric/Behavioral: Negative.     Objective  Filed Vitals:   05/17/15 1337  BP: 162/70  Pulse: 69  Temp: 98.6 F (37 C)  TempSrc: Oral  Resp: 16  Weight: 144 lb 9.6 oz (65.59 kg)  SpO2: 98%   Physical Exam  Constitutional: She is oriented to person, place, and time and well-developed, well-nourished, and in no distress.  HENT:  Head:  Normocephalic.  Right Ear: External ear normal.  Left Ear: External ear normal.  Nose: Nose normal.  Mouth/Throat: Oropharynx is clear and moist.  Eyes: Conjunctivae and EOM are normal.  Neck: Neck supple.  Cardiovascular: Normal rate, regular rhythm and intact distal pulses.   Pulmonary/Chest: She has rales.  Left posterior base.  Abdominal: Soft. Bowel sounds are normal.  Neurological: She is alert and oriented to person, place, and time.  Skin: No rash noted.  Psychiatric: Affect and judgment normal.     Recent Results (from the past 2160 hour(s))  Basic metabolic panel  Status: Abnormal   Collection Time: 02/18/15  5:37 AM  Result Value Ref Range   Sodium 139 135 - 145 mmol/L   Potassium 3.3 (L) 3.5 - 5.1 mmol/L   Chloride 97 (L) 101 - 111 mmol/L   CO2 32 22 - 32 mmol/L   Glucose, Bld 110 (H) 65 - 99 mg/dL   BUN 9 6 - 20 mg/dL   Creatinine, Ser 0.68 0.44 - 1.00 mg/dL   Calcium 8.4 (L) 8.9 - 10.3 mg/dL   GFR calc non Af Amer >60 >60 mL/min   GFR calc Af Amer >60 >60 mL/min    Comment: (NOTE) The eGFR has been calculated using the CKD EPI equation. This calculation has not been validated in all clinical situations. eGFR's persistently <60 mL/min signify possible Chronic Kidney Disease.    Anion gap 10 5 - 15  CBC with Differential     Status: Abnormal   Collection Time: 04/30/15 12:31 PM  Result Value Ref Range   WBC 7.7 3.4 - 10.8 x10E3/uL   RBC 4.10 3.77 - 5.28 x10E6/uL   Hemoglobin 11.3 11.1 - 15.9 g/dL   Hematocrit 35.6 34.0 - 46.6 %   MCV 87 79 - 97 fL   MCH 27.6 26.6 - 33.0 pg   MCHC 31.7 31.5 - 35.7 g/dL   RDW 15.9 (H) 12.3 - 15.4 %   Platelets 357 150 - 379 x10E3/uL   Neutrophils 63 %   Lymphs 26 %   Monocytes 8 %   Eos 2 %   Basos 1 %   Neutrophils Absolute 4.9 1.4 - 7.0 x10E3/uL   Lymphocytes Absolute 2.0 0.7 - 3.1 x10E3/uL   Monocytes Absolute 0.6 0.1 - 0.9 x10E3/uL   EOS (ABSOLUTE) 0.1 0.0 - 0.4 x10E3/uL   Basophils Absolute 0.1 0.0 - 0.2  x10E3/uL   Immature Granulocytes 0 %   Immature Grans (Abs) 0.0 0.0 - 0.1 x10E3/uL  Comprehensive Metabolic Panel (CMET)     Status: Abnormal   Collection Time: 04/30/15 12:31 PM  Result Value Ref Range   Glucose 100 (H) 65 - 99 mg/dL   BUN 35 (H) 8 - 27 mg/dL   Creatinine, Ser 0.94 0.57 - 1.00 mg/dL   GFR calc non Af Amer 54 (L) >59 mL/min/1.73   GFR calc Af Amer 62 >59 mL/min/1.73   BUN/Creatinine Ratio 37 (H) 11 - 26   Sodium 137 136 - 144 mmol/L   Potassium 5.3 (H) 3.5 - 5.2 mmol/L   Chloride 103 97 - 106 mmol/L   CO2 18 18 - 29 mmol/L   Calcium 9.2 8.7 - 10.3 mg/dL   Total Protein 6.7 6.0 - 8.5 g/dL   Albumin 4.3 3.5 - 4.7 g/dL   Globulin, Total 2.4 1.5 - 4.5 g/dL   Albumin/Globulin Ratio 1.8 1.1 - 2.5   Bilirubin Total 0.3 0.0 - 1.2 mg/dL   Alkaline Phosphatase 98 39 - 117 IU/L   AST 26 0 - 40 IU/L   ALT 15 0 - 32 IU/L    Assessment & Plan  1. Chest rales Onset over the past 5 days and recognized by cardiologist. Will get CXR and CBC to rule out pneumonia. Given prednisone taper with antibiotic. May use cough drops and increase fluid intake. Pending results, may need follow up in 3 days. - amoxicillin (AMOXIL) 875 MG tablet; Take 1 tablet (875 mg total) by mouth 2 (two) times daily.  Dispense: 20 tablet; Refill: 0 - predniSONE (DELTASONE) 5 MG tablet; Take as  a taper as directed on package. Start with 6 tablets the first day spaced out with meals by mouth (6,5,4,3,2,1)  Dispense: 21 tablet; Refill: 0 - CBC with Differential/Platelet - DG Chest 2 View  2. Hx of pneumococcal pneumonia Had hospitalization August 2016 for pneumonia in the left base. Same area of rales today. Had pneumonia vaccination in the past per patient. Continue Breo, Ventolin, Spiriva and Singulair as prescribed. Will keep appointment with Dr. Vella Kohler (pulmonologist) in Feb. 2017.

## 2015-05-18 LAB — CBC WITH DIFFERENTIAL/PLATELET
BASOS: 1 %
Basophils Absolute: 0 10*3/uL (ref 0.0–0.2)
EOS (ABSOLUTE): 0.2 10*3/uL (ref 0.0–0.4)
Eos: 2 %
Hematocrit: 36.2 % (ref 34.0–46.6)
Hemoglobin: 11.5 g/dL (ref 11.1–15.9)
IMMATURE GRANULOCYTES: 0 %
Immature Grans (Abs): 0 10*3/uL (ref 0.0–0.1)
Lymphocytes Absolute: 1.9 10*3/uL (ref 0.7–3.1)
Lymphs: 30 %
MCH: 28.2 pg (ref 26.6–33.0)
MCHC: 31.8 g/dL (ref 31.5–35.7)
MCV: 89 fL (ref 79–97)
MONOS ABS: 0.6 10*3/uL (ref 0.1–0.9)
Monocytes: 9 %
NEUTROS PCT: 58 %
Neutrophils Absolute: 3.8 10*3/uL (ref 1.4–7.0)
PLATELETS: 282 10*3/uL (ref 150–379)
RBC: 4.08 x10E6/uL (ref 3.77–5.28)
RDW: 15.6 % — AB (ref 12.3–15.4)
WBC: 6.5 10*3/uL (ref 3.4–10.8)

## 2015-05-19 ENCOUNTER — Telehealth: Payer: Self-pay

## 2015-05-19 NOTE — Telephone Encounter (Signed)
-----   Message from Margo Common, [place] sent at 05/18/2015  6:12 PM EST ----- No sign of infection of significance on CBC or chest x-ray. Finish the antibiotic and prednisone taper. Recheck in a week if any symptoms remain. Keep appointment as scheduled with Dr. Vella Kohler in Feb. 2017.

## 2015-05-19 NOTE — Telephone Encounter (Signed)
Pt advised as directed below.   Thanks,   -Leila Schuff  

## 2015-05-28 ENCOUNTER — Ambulatory Visit (INDEPENDENT_AMBULATORY_CARE_PROVIDER_SITE_OTHER): Payer: Medicare Other | Admitting: Physician Assistant

## 2015-05-28 ENCOUNTER — Encounter: Payer: Self-pay | Admitting: Physician Assistant

## 2015-05-28 VITALS — BP 130/60 | HR 68 | Temp 97.6°F | Resp 16 | Wt 143.4 lb

## 2015-05-28 DIAGNOSIS — I6523 Occlusion and stenosis of bilateral carotid arteries: Secondary | ICD-10-CM | POA: Diagnosis not present

## 2015-05-28 DIAGNOSIS — J0140 Acute pansinusitis, unspecified: Secondary | ICD-10-CM

## 2015-05-28 MED ORDER — DOXYCYCLINE HYCLATE 100 MG PO TABS: 100.0000 mg | ORAL_TABLET | Freq: Two times a day (BID) | ORAL | Status: DC

## 2015-05-28 NOTE — Patient Instructions (Signed)

## 2015-05-28 NOTE — Progress Notes (Signed)
Patient: Chelsea [name] Female    DOB: 06-17-1924   81 y.o.   MRN: [id number] Visit Date: 05/28/2015  Today's Provider: Mar Daring, PA-C   Chief Complaint  Patient presents with  . Otalgia  . Sore Throat   Subjective:    Otalgia  There is pain in both (right worst) ears. This is a recurrent (Patient was seen 05/17/15) problem. The current episode started 1 to 4 weeks ago. The problem occurs constantly. The problem has been unchanged. There has been no fever. The pain is at a severity of 5/10. Associated symptoms include coughing (a little), headaches, rhinorrhea and a sore throat. Pertinent negatives include no vomiting. Associated symptoms comments: Prednisone. She has tried antibiotics and acetaminophen for the symptoms. The treatment provided no relief.  Sore Throat  This is a recurrent problem. The current episode started 1 to 4 weeks ago (Patient was seen 05/17/15). The problem has been unchanged. Neither side of throat is experiencing more pain than the other. There has been no fever. The pain is moderate. Associated symptoms include congestion, coughing (a little), ear pain, headaches and a plugged ear sensation. Pertinent negatives include no shortness of breath, trouble swallowing or vomiting. Treatments tried: antibiotic Amox. The treatment provided no relief.  She was seen back on 05/11/2015 by Dr. Rockey Situ and found to have rales in the right lower lung lobe. She does have a history of pneumonia from August of this year. She then came to see Arnaldo Natal, PA-C and was prescribed amoxicillin 875 mg to be taken twice daily for 10 days. She also had a chest x-ray which was found to be negative. She completed her treatment yesterday but states that yesterday was when she noticed the increased sinus pressure and headache. She also noticed her throat being sore stating she felt like she had rocks in the back of her throat. She also states that she feels like her ears are  very congested causing pain in the right ear. She also mentions the postnasal drainage has been thick and is making her cough again.     Allergies  Allergen Reactions  . Sulfa Antibiotics     Other reaction(s): Dizziness  . Sulfonamide Derivatives Other (See Comments)    Reaction:  Dizziness    Previous Medications   ALBUTEROL (PROVENTIL HFA;VENTOLIN HFA) 108 (90 BASE) MCG/ACT INHALER    Inhale 2 puffs into the lungs every 6 (six) hours as needed for wheezing or shortness of breath.    ALENDRONATE (FOSAMAX) 70 MG TABLET    take 1 tablet by mouth ONCE A WEEK, IN THE MORNING 30 MINUTES BEFORE EATING. DRINK 8 OUNCES OF WATER WITH MEDICATION AND DO NOT LIE DOWN FOR   AMLODIPINE (NORVASC) 10 MG TABLET    Take 1 tablet (10 mg total) by mouth daily.   ASPIRIN EC 81 MG TABLET    Take 81 mg by mouth daily.   ATORVASTATIN (LIPITOR) 80 MG TABLET    Take 1 tablet (80 mg total) by mouth daily.   CALCIUM CITRATE-VITAMIN D (CITRACAL+D) 315-200 MG-UNIT PER TABLET    Take 1 tablet by mouth daily.   FEEDING SUPPLEMENT, ENSURE ENLIVE, (ENSURE ENLIVE) LIQD    Take 237 mLs by mouth 2 (two) times daily with a meal.   FLUTICASONE (FLONASE) 50 MCG/ACT NASAL SPRAY    Place 2 sprays into both nostrils daily.   FLUTICASONE FUROATE-VILANTEROL (BREO ELLIPTA) 100-25 MCG/INH AEPB    Inhale 2 mcg into  the lungs every morning.   FUROSEMIDE (LASIX) 20 MG TABLET    Take 1 tablet (20 mg total) by mouth daily as needed.   LEVOTHYROXINE (SYNTHROID, LEVOTHROID) 88 MCG TABLET    take 1 tablet by mouth once daily   METAXALONE (SKELAXIN) 800 MG TABLET    Take 1 tablet (800 mg total) by mouth 3 (three) times daily as needed for muscle spasms.   MONTELUKAST (SINGULAIR) 10 MG TABLET    Take 10 mg by mouth at bedtime.   MULTIPLE VITAMINS-MINERALS (PRESERVISION AREDS PO)    Take 1 capsule by mouth daily.    NITROGLYCERIN (NITROSTAT) 0.4 MG SL TABLET    Place 1 tablet (0.4 mg total) under the tongue every 5 (five) minutes as needed.    OMEPRAZOLE (PRILOSEC) 20 MG CAPSULE    Take 1 capsule (20 mg total) by mouth daily.   OXYBUTYNIN (DITROPAN) 5 MG TABLET    take 1 tablet by mouth at bedtime   OXYCODONE-ACETAMINOPHEN (PERCOCET) 10-325 MG PER TABLET    Take 1 tablet by mouth every 6 (six) hours as needed for pain.   POTASSIUM CHLORIDE (K-DUR) 10 MEQ TABLET    Take 1 tablet (10 mEq total) by mouth daily as needed (take w/ lasix).   SENNA (SENOKOT) 8.6 MG TABS TABLET    Take 1 tablet by mouth as needed for mild constipation.   SERTRALINE (ZOLOFT) 100 MG TABLET    Take 1.5 tablets (150 mg total) by mouth daily.   TIOTROPIUM (SPIRIVA HANDIHALER) 18 MCG INHALATION CAPSULE    Place 1 capsule (18 mcg total) into inhaler and inhale daily.    Review of Systems  Constitutional: Negative for fever, chills and fatigue.  HENT: Positive for congestion, ear pain, postnasal drip, rhinorrhea, sinus pressure and sore throat. Negative for sneezing, tinnitus, trouble swallowing and voice change.   Eyes: Negative.   Respiratory: Positive for cough (a little). Negative for chest tightness, shortness of breath and wheezing.   Cardiovascular: Negative.   Gastrointestinal: Negative for nausea and vomiting.  Neurological: Positive for headaches.    Social History  Substance Use Topics  . Smoking status: Former Research scientist (life sciences)  . Smokeless tobacco: Never Used     Comment: quit 50 years ago  . Alcohol Use: No   Objective:   BP 130/60 mmHg  Pulse 68  Temp(Src) 97.6 F (36.4 C) (Oral)  Resp 16  Wt 143 lb 6.4 oz (65.046 kg)  Physical Exam  Constitutional: She appears well-developed and well-nourished. No distress.  HENT:  Head: Normocephalic and atraumatic.  Right Ear: Hearing, external ear and ear canal normal. A middle ear effusion is present.  Left Ear: Hearing, tympanic membrane, external ear and ear canal normal.  No middle ear effusion.  Nose: Mucosal edema and rhinorrhea present. Right sinus exhibits maxillary sinus tenderness and frontal  sinus tenderness. Left sinus exhibits maxillary sinus tenderness and frontal sinus tenderness.  Mouth/Throat: Uvula is midline, oropharynx is clear and moist and mucous membranes are normal. No oropharyngeal exudate or posterior oropharyngeal erythema.  Neck: Normal range of motion. Neck supple. No tracheal deviation present. No thyromegaly present.  Cardiovascular: Normal rate, regular rhythm and normal heart sounds.  Exam reveals no gallop and no friction rub.   No murmur heard. Pulmonary/Chest: Effort normal. No stridor. No respiratory distress. She has no decreased breath sounds. She has no wheezes. She has no rhonchi. She has rales (Still present but faint) in the right lower field.  Lymphadenopathy:    She  has no cervical adenopathy.  Skin: She is not diaphoretic.  Vitals reviewed.       Assessment & Plan:     1. Acute pansinusitis, recurrence not specified Since she did not have complete resolution of symptoms with amoxicillin 875 mg I will treat again with doxycycline this time. Being that her lung sounds seem stable compared to previous exam and with a negative chest x-ray I will not obtain another chest x-ray at this time. I did advise her to call the office if she does not have symptom resolution or if her cough worsens. She voiced understanding and agrees to do so. I also advised her to try to rest and to make sure to be staying well-hydrated. I also advised that she may take Tylenol as needed if she develops any fever. She is to call the office if she develops a fever greater than 103F. She is to call the office if symptoms fail to improve or worsen. - doxycycline (VIBRA-TABS) 100 MG tablet; Take 1 tablet (100 mg total) by mouth 2 (two) times daily.  Dispense: 14 tablet; Refill: 0       Mar Daring, PA-C  [facility]

## 2015-06-07 ENCOUNTER — Encounter: Payer: Self-pay | Admitting: Physician Assistant

## 2015-06-07 ENCOUNTER — Ambulatory Visit (INDEPENDENT_AMBULATORY_CARE_PROVIDER_SITE_OTHER): Payer: Medicare Other | Admitting: Physician Assistant

## 2015-06-07 VITALS — BP 140/58 | HR 60 | Temp 97.6°F | Resp 16 | Wt 143.2 lb

## 2015-06-07 DIAGNOSIS — I6523 Occlusion and stenosis of bilateral carotid arteries: Secondary | ICD-10-CM | POA: Diagnosis not present

## 2015-06-07 DIAGNOSIS — J0140 Acute pansinusitis, unspecified: Secondary | ICD-10-CM

## 2015-06-07 MED ORDER — MOXIFLOXACIN HCL 400 MG PO TABS: 400.0000 mg | ORAL_TABLET | Freq: Every day | ORAL | Status: DC

## 2015-06-07 NOTE — Patient Instructions (Signed)

## 2015-06-07 NOTE — Progress Notes (Signed)
Patient: Chelsea [name] Female    DOB: February 28, 1924   81 y.o.   MRN: [id number] Visit Date: 06/07/2015  Today's Provider: Mar Daring, PA-C   Chief Complaint  Patient presents with  . URI   Subjective:    URI  This is a recurrent (Patient has been seen for this symptoms. Patient states not feeling any better.) problem. The current episode started 1 to 4 weeks ago. There has been no fever. Associated symptoms include congestion, ear pain (left > right), headaches, a plugged ear sensation, sneezing and a sore throat. Pertinent negatives include no abdominal pain, chest pain, coughing, nausea, rhinorrhea, sinus pain, vomiting or wheezing. Treatments tried: Amox, Doxycycline. The treatment provided no relief.   She does have an appointment with Dr. Tami Ribas in 2 weeks. She also mentions that many years ago she did see a physician up [place] that did a sinuplasty on her.    Allergies  Allergen Reactions  . Sulfa Antibiotics     Other reaction(s): Dizziness  . Sulfonamide Derivatives Other (See Comments)    Reaction:  Dizziness    Previous Medications   ALBUTEROL (PROVENTIL HFA;VENTOLIN HFA) 108 (90 BASE) MCG/ACT INHALER    Inhale 2 puffs into the lungs every 6 (six) hours as needed for wheezing or shortness of breath.    ALENDRONATE (FOSAMAX) 70 MG TABLET    take 1 tablet by mouth ONCE A WEEK, IN THE MORNING 30 MINUTES BEFORE EATING. DRINK 8 OUNCES OF WATER WITH MEDICATION AND DO NOT LIE DOWN FOR   AMLODIPINE (NORVASC) 10 MG TABLET    Take 1 tablet (10 mg total) by mouth daily.   ASPIRIN EC 81 MG TABLET    Take 81 mg by mouth daily.   ATORVASTATIN (LIPITOR) 80 MG TABLET    Take 1 tablet (80 mg total) by mouth daily.   CALCIUM CITRATE-VITAMIN D (CITRACAL+D) 315-200 MG-UNIT PER TABLET    Take 1 tablet by mouth daily.   DOXYCYCLINE (VIBRA-TABS) 100 MG TABLET    Take 1 tablet (100 mg total) by mouth 2 (two) times daily.   FEEDING SUPPLEMENT, ENSURE ENLIVE, (ENSURE ENLIVE) LIQD     Take 237 mLs by mouth 2 (two) times daily with a meal.   FLUTICASONE (FLONASE) 50 MCG/ACT NASAL SPRAY    Place 2 sprays into both nostrils daily.   FLUTICASONE FUROATE-VILANTEROL (BREO ELLIPTA) 100-25 MCG/INH AEPB    Inhale 2 mcg into the lungs every morning.   FUROSEMIDE (LASIX) 20 MG TABLET    Take 1 tablet (20 mg total) by mouth daily as needed.   LEVOTHYROXINE (SYNTHROID, LEVOTHROID) 88 MCG TABLET    take 1 tablet by mouth once daily   METAXALONE (SKELAXIN) 800 MG TABLET    Take 1 tablet (800 mg total) by mouth 3 (three) times daily as needed for muscle spasms.   MONTELUKAST (SINGULAIR) 10 MG TABLET    Take 10 mg by mouth at bedtime.   MULTIPLE VITAMINS-MINERALS (PRESERVISION AREDS PO)    Take 1 capsule by mouth daily.    NITROGLYCERIN (NITROSTAT) 0.4 MG SL TABLET    Place 1 tablet (0.4 mg total) under the tongue every 5 (five) minutes as needed.   OMEPRAZOLE (PRILOSEC) 20 MG CAPSULE    Take 1 capsule (20 mg total) by mouth daily.   OXYBUTYNIN (DITROPAN) 5 MG TABLET    take 1 tablet by mouth at bedtime   OXYCODONE-ACETAMINOPHEN (PERCOCET) 10-325 MG PER TABLET    Take  1 tablet by mouth every 6 (six) hours as needed for pain.   POTASSIUM CHLORIDE (K-DUR) 10 MEQ TABLET    Take 1 tablet (10 mEq total) by mouth daily as needed (take w/ lasix).   SENNA (SENOKOT) 8.6 MG TABS TABLET    Take 1 tablet by mouth as needed for mild constipation.   SERTRALINE (ZOLOFT) 100 MG TABLET    Take 1.5 tablets (150 mg total) by mouth daily.   TIOTROPIUM (SPIRIVA HANDIHALER) 18 MCG INHALATION CAPSULE    Place 1 capsule (18 mcg total) into inhaler and inhale daily.    Review of Systems  Constitutional: Positive for fatigue. Negative for fever and chills.  HENT: Positive for congestion, ear pain (left > right), postnasal drip, sinus pressure, sneezing and sore throat. Negative for hearing loss, rhinorrhea, tinnitus and trouble swallowing.   Eyes: Negative.   Respiratory: Negative for cough, chest tightness,  shortness of breath and wheezing.   Cardiovascular: Negative for chest pain, palpitations and leg swelling.  Gastrointestinal: Negative for nausea, vomiting and abdominal pain.  Neurological: Positive for headaches. Negative for dizziness.    Social History  Substance Use Topics  . Smoking status: Former Research scientist (life sciences)  . Smokeless tobacco: Never Used     Comment: quit 50 years ago  . Alcohol Use: No   Objective:   BP 140/58 mmHg  Pulse 60  Temp(Src) 97.6 F (36.4 C) (Oral)  Resp 16  Wt 143 lb 3.2 oz (64.955 kg)  Physical Exam  Constitutional: She appears well-developed and well-nourished. No distress.  HENT:  Head: Normocephalic and atraumatic.  Right Ear: Hearing, external ear and ear canal normal. Tympanic membrane is not erythematous, not retracted and not bulging. A middle ear effusion is present.  Left Ear: Hearing, external ear and ear canal normal. Tympanic membrane is not erythematous, not retracted and not bulging. A middle ear effusion is present.  Nose: Mucosal edema present. No rhinorrhea. Right sinus exhibits maxillary sinus tenderness and frontal sinus tenderness. Left sinus exhibits maxillary sinus tenderness and frontal sinus tenderness.  Mouth/Throat: Uvula is midline and mucous membranes are normal. No oropharyngeal exudate, posterior oropharyngeal edema or posterior oropharyngeal erythema.  Neck: Normal range of motion. Neck supple. No tracheal deviation present. No thyromegaly present.  Cardiovascular: Normal rate, regular rhythm and normal heart sounds.  Exam reveals no gallop and no friction rub.   No murmur heard. Pulmonary/Chest: Effort normal and breath sounds normal. No stridor. No respiratory distress. She has no wheezes. She has no rales.  Lymphadenopathy:    She has no cervical adenopathy.  Skin: She is not diaphoretic.  Vitals reviewed.       Assessment & Plan:     1. Acute pansinusitis, recurrence not specified She still having a lot of sinus  tenderness and congestion. She still complains of ear fullness as well as ear pain. She states that the ear pain was worse yesterday on the left side. She also complains of a lot of tenderness along her cervical lymph nodes stating they were swollen yesterday. On exam today they were not swollen. I will treat her with Avelox as below being that she has failed amoxicillin and doxycycline. She does have an appointment with Dr. Tami Ribas already scheduled for approximately 2 weeks out. She is to call the office if symptoms fail to improve or worsen. - moxifloxacin (AVELOX) 400 MG tablet; Take 1 tablet (400 mg total) by mouth daily.  Dispense: 10 tablet; Refill: 0       Anderson Malta  Dorothy Puffer, PA-C  [facility]

## 2015-06-14 ENCOUNTER — Emergency Department: Payer: Medicare Other

## 2015-06-14 ENCOUNTER — Other Ambulatory Visit: Payer: Self-pay

## 2015-06-14 ENCOUNTER — Emergency Department
Admission: EM | Admit: 2015-06-14 | Discharge: 2015-06-14 | Disposition: A | Discharge status: Home / Self Care | Payer: Medicare Other | Attending: Emergency Medicine | Admitting: Emergency Medicine

## 2015-06-14 DIAGNOSIS — Z79899 Other long term (current) drug therapy: Secondary | ICD-10-CM | POA: Diagnosis not present

## 2015-06-14 DIAGNOSIS — S0003XA Contusion of scalp, initial encounter: Secondary | ICD-10-CM | POA: Diagnosis not present

## 2015-06-14 DIAGNOSIS — S79912A Unspecified injury of left hip, initial encounter: Secondary | ICD-10-CM | POA: Insufficient documentation

## 2015-06-14 DIAGNOSIS — E119 Type 2 diabetes mellitus without complications: Secondary | ICD-10-CM | POA: Diagnosis not present

## 2015-06-14 DIAGNOSIS — M25551 Pain in right hip: Secondary | ICD-10-CM | POA: Diagnosis not present

## 2015-06-14 DIAGNOSIS — Z87891 Personal history of nicotine dependence: Secondary | ICD-10-CM | POA: Diagnosis not present

## 2015-06-14 DIAGNOSIS — S99921A Unspecified injury of right foot, initial encounter: Secondary | ICD-10-CM | POA: Diagnosis not present

## 2015-06-14 DIAGNOSIS — S90121A Contusion of right lesser toe(s) without damage to nail, initial encounter: Secondary | ICD-10-CM | POA: Diagnosis not present

## 2015-06-14 DIAGNOSIS — W1789XA Other fall from one level to another, initial encounter: Secondary | ICD-10-CM | POA: Insufficient documentation

## 2015-06-14 DIAGNOSIS — S8992XA Unspecified injury of left lower leg, initial encounter: Secondary | ICD-10-CM | POA: Diagnosis not present

## 2015-06-14 DIAGNOSIS — S79911A Unspecified injury of right hip, initial encounter: Secondary | ICD-10-CM | POA: Diagnosis not present

## 2015-06-14 DIAGNOSIS — W19XXXA Unspecified fall, initial encounter: Secondary | ICD-10-CM

## 2015-06-14 DIAGNOSIS — S92512D Displaced fracture of proximal phalanx of left lesser toe(s), subsequent encounter for fracture with routine healing: Secondary | ICD-10-CM | POA: Diagnosis not present

## 2015-06-14 DIAGNOSIS — S92919D Unspecified fracture of unspecified toe(s), subsequent encounter for fracture with routine healing: Secondary | ICD-10-CM

## 2015-06-14 DIAGNOSIS — S0990XA Unspecified injury of head, initial encounter: Secondary | ICD-10-CM | POA: Diagnosis not present

## 2015-06-14 DIAGNOSIS — Z7951 Long term (current) use of inhaled steroids: Secondary | ICD-10-CM | POA: Insufficient documentation

## 2015-06-14 DIAGNOSIS — Y9289 Other specified places as the place of occurrence of the external cause: Secondary | ICD-10-CM | POA: Diagnosis not present

## 2015-06-14 DIAGNOSIS — S99922D Unspecified injury of left foot, subsequent encounter: Secondary | ICD-10-CM | POA: Diagnosis present

## 2015-06-14 DIAGNOSIS — Z792 Long term (current) use of antibiotics: Secondary | ICD-10-CM | POA: Diagnosis not present

## 2015-06-14 DIAGNOSIS — T07XXXA Unspecified multiple injuries, initial encounter: Secondary | ICD-10-CM

## 2015-06-14 DIAGNOSIS — S8392XA Sprain of unspecified site of left knee, initial encounter: Secondary | ICD-10-CM

## 2015-06-14 DIAGNOSIS — S90122A Contusion of left lesser toe(s) without damage to nail, initial encounter: Secondary | ICD-10-CM | POA: Insufficient documentation

## 2015-06-14 DIAGNOSIS — Y9389 Activity, other specified: Secondary | ICD-10-CM | POA: Insufficient documentation

## 2015-06-14 DIAGNOSIS — Z7982 Long term (current) use of aspirin: Secondary | ICD-10-CM | POA: Insufficient documentation

## 2015-06-14 DIAGNOSIS — Y998 Other external cause status: Secondary | ICD-10-CM | POA: Insufficient documentation

## 2015-06-14 DIAGNOSIS — I10 Essential (primary) hypertension: Secondary | ICD-10-CM | POA: Diagnosis not present

## 2015-06-14 DIAGNOSIS — M25511 Pain in right shoulder: Secondary | ICD-10-CM | POA: Diagnosis not present

## 2015-06-14 DIAGNOSIS — S4991XA Unspecified injury of right shoulder and upper arm, initial encounter: Secondary | ICD-10-CM | POA: Insufficient documentation

## 2015-06-14 DIAGNOSIS — S83402A Sprain of unspecified collateral ligament of left knee, initial encounter: Secondary | ICD-10-CM | POA: Diagnosis not present

## 2015-06-14 DIAGNOSIS — M79671 Pain in right foot: Secondary | ICD-10-CM | POA: Diagnosis not present

## 2015-06-14 DIAGNOSIS — M79672 Pain in left foot: Secondary | ICD-10-CM | POA: Diagnosis not present

## 2015-06-14 DIAGNOSIS — S92511D Displaced fracture of proximal phalanx of right lesser toe(s), subsequent encounter for fracture with routine healing: Secondary | ICD-10-CM | POA: Diagnosis not present

## 2015-06-14 DIAGNOSIS — M25562 Pain in left knee: Secondary | ICD-10-CM | POA: Diagnosis not present

## 2015-06-14 MED ORDER — ACETAMINOPHEN 325 MG PO TABS
650.0000 mg | ORAL_TABLET | Freq: Once | ORAL | Status: AC
  Administered 2015-06-14: 650 mg via ORAL
  Filled 2015-06-14: qty 2

## 2015-06-14 NOTE — ED Notes (Signed)
Pt arrives via [facility]. Pt states that she fell yesterday AM backwards, landing on her back. Pt c/o right arm pain, left leg pain, bilateral feet pain. Denies LOC. Pt states that she only takes a baby aspirin a day, no blood thinners.  Pt alert and oriented X4, active, cooperative, pt in NAD. RR even and unlabored, color WNL.

## 2015-06-14 NOTE — Discharge Instructions (Signed)
You may have a small crack in your toe although it is not completely jaundiced on x-ray. We advised you to wear comfortable shoes. There is no evidence of a break in your knee or other bones. CAT scan is reassuring. Please take Tylenol for pain, follow-up with the orthopedic surgeon and return to the emergency room for any new or worrisome symptoms

## 2015-06-14 NOTE — ED Notes (Signed)
Patient ambulated well with the assistance of a walker.  I was stand-by assist.

## 2015-06-14 NOTE — ED Provider Notes (Addendum)
[facility] Emergency Department Provider Note  ____________________________________________   I have reviewed the triage vital signs and the nursing notes.   HISTORY  Chief Complaint Fall    HPI Chelsea [name] is a 81 y.o. female who is alert and oriented. She states that she was at home yesterday reaching up to get a ceiling fan to turn off she lost her balance and fell landing on her back. She bumped her head, she has had some pain and swelling to the toes of her feet bilaterally, as well as pain to the left knee and pain to both hips and pain to the right shoulder. Patient has been mandatory which she states it hurts to walk. She has had no loss of consciousness no nausea no vomiting or diarrhea and no change in sensorium. She denies significant headache. She denies back or neck pain. She did not want any pain medication. This was a non-syncopal event, she states that she remembers falling and there was no antecedent nurses when chest pain shortness of breath nausea or vomiting.  Past Medical History  Diagnosis Date  . Coronary artery disease     a. s/p 2 vessel CABG 1989; b. echo 2003: nl; c. cath 2006: patent grafts, EF 65%; d. nuclear stress test 2007: no ischemia, EF 81%  . Hyperlipidemia   . Hypertension   . Bradycardia     hx of it and fatigue with beta blockade  . History of hyperkalemia   . Carotid artery disease ([place])     s/p carotid endarterectomy  . Stroke [facility]) 1980s    left brain  . Mitral valve prolapse   . Osteoporosis   . Asthma   . Degenerative disc disease   . Spinal stenosis in cervical region   . Norovirus 2014    Patient Active Problem List   Diagnosis Date Noted  . Pulmonary fibrosis ([place]) 05/11/2015  . SOB (shortness of breath)   . Weakness   . 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 Peripheral blood vessel disorder ([place]) 02/09/2015  . 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 OP (osteoporosis) 02/09/2015  . Billowing  mitral valve 02/09/2015  . Degeneration macular 02/09/2015  . Hypercholesteremia 02/09/2015  . Adult hypothyroidism 02/09/2015  . Cannot sleep 02/09/2015  . 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 Presence of aortocoronary bypass graft 02/09/2015  . 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 Esophagitis, reflux 02/09/2015  . Diverticulitis 02/09/2015  . Colon, diverticulosis 02/09/2015  . Constipation due to opioid therapy 02/09/2015  . CAD in native artery 02/09/2015  . Allergic rhinitis 02/09/2015  . Back ache 02/09/2015  . Airway hyperreactivity 02/09/2015  . COPD (chronic obstructive pulmonary disease) ([place]) 01/15/2015  . GERD (gastroesophageal reflux disease) 12/23/2014  . Spinal stenosis at L4-L5 level 12/16/2014  . Lumbar nerve root compression 12/16/2014  . Compression fracture of lumbar vertebra ([place]) 12/16/2014  . Lumbar canal stenosis 11/04/2013  . Neuritis or radiculitis due to rupture of lumbar intervertebral disc 11/04/2013  . DDD (degenerative disc disease), lumbar 11/04/2013  . Degeneration of intervertebral disc of lumbar region 11/04/2013  . Dermatophytosis of nail 03/31/2013  . Pain in limb 03/31/2013  . HTN (hypertension) 07/24/2011  . Hyperlipidemia 01/31/2010  . Carotid artery stenosis 12/21/2009  . CORONARY ATHEROSLERO AUTOL VEIN BYPASS GRAFT 05/25/2009  . Dyspnea on exertion 05/25/2009    Past Surgical History  Procedure Laterality Date  . Coronary artery bypass graft    . Carotid endarterectomy      left  .  Cholecystectomy    . Abdominal hysterectomy    . Bladder suspension    . Hip surgery  02/2011    right    Current Outpatient Rx  Name  Route  Sig  Dispense  Refill  . albuterol (PROVENTIL HFA;VENTOLIN HFA) 108 (90 BASE) MCG/ACT inhaler   Inhalation   Inhale 2 puffs into the lungs every 6 (six) hours as needed for wheezing or shortness of breath.          Marland Kitchen alendronate (FOSAMAX) 70 MG tablet      take 1 tablet by mouth ONCE A WEEK, IN THE MORNING 30  MINUTES BEFORE EATING. DRINK 8 OUNCES OF WATER WITH MEDICATION AND DO NOT LIE DOWN FOR   12 tablet   3   . amLODipine (NORVASC) 10 MG tablet   Oral   Take 1 tablet (10 mg total) by mouth daily.   90 tablet   3   . aspirin EC 81 MG tablet   Oral   Take 81 mg by mouth daily.         Marland Kitchen atorvastatin (LIPITOR) 80 MG tablet   Oral   Take 1 tablet (80 mg total) by mouth daily.   90 tablet   4   . calcium citrate-vitamin D (CITRACAL+D) 315-200 MG-UNIT per tablet   Oral   Take 1 tablet by mouth daily.         Marland Kitchen doxycycline (VIBRA-TABS) 100 MG tablet   Oral   Take 1 tablet (100 mg total) by mouth 2 (two) times daily. Patient not taking: Reported on 06/07/2015   14 tablet   0   . feeding supplement, ENSURE ENLIVE, (ENSURE ENLIVE) LIQD   Oral   Take 237 mLs by mouth 2 (two) times daily with a meal.   237 mL   12   . fluticasone (FLONASE) 50 MCG/ACT nasal spray   Each Nare   Place 2 sprays into both nostrils daily.         . Fluticasone Furoate-Vilanterol (BREO ELLIPTA) 100-25 MCG/INH AEPB   Inhalation   Inhale 2 mcg into the lungs every morning.         . furosemide (LASIX) 20 MG tablet   Oral   Take 1 tablet (20 mg total) by mouth daily as needed.   30 tablet   6   . levothyroxine (SYNTHROID, LEVOTHROID) 88 MCG tablet      take 1 tablet by mouth once daily   90 tablet   3   . metaxalone (SKELAXIN) 800 MG tablet   Oral   Take 1 tablet (800 mg total) by mouth 3 (three) times daily as needed for muscle spasms.   15 tablet   0   . montelukast (SINGULAIR) 10 MG tablet   Oral   Take 10 mg by mouth at bedtime.         . moxifloxacin (AVELOX) 400 MG tablet   Oral   Take 1 tablet (400 mg total) by mouth daily.   10 tablet   0   . Multiple Vitamins-Minerals (PRESERVISION AREDS PO)   Oral   Take 1 capsule by mouth daily.          . nitroGLYCERIN (NITROSTAT) 0.4 MG SL tablet   Sublingual   Place 1 tablet (0.4 mg total) under the tongue every 5  (five) minutes as needed.   25 tablet   6   . omeprazole (PRILOSEC) 20 MG capsule   Oral   Take 1  capsule (20 mg total) by mouth daily.   180 capsule   3   . oxybutynin (DITROPAN) 5 MG tablet      take 1 tablet by mouth at bedtime   30 tablet   2   . oxyCODONE-acetaminophen (PERCOCET) 10-325 MG per tablet   Oral   Take 1 tablet by mouth every 6 (six) hours as needed for pain.   20 tablet   0   . potassium chloride (K-DUR) 10 MEQ tablet   Oral   Take 1 tablet (10 mEq total) by mouth daily as needed (take w/ lasix).   30 tablet   6   . senna (SENOKOT) 8.6 MG TABS tablet   Oral   Take 1 tablet by mouth as needed for mild constipation.         . sertraline (ZOLOFT) 100 MG tablet   Oral   Take 1.5 tablets (150 mg total) by mouth daily.   45 tablet   3   . tiotropium (SPIRIVA HANDIHALER) 18 MCG inhalation capsule   Inhalation   Place 1 capsule (18 mcg total) into inhaler and inhale daily.   90 capsule   0     Allergies Sulfa antibiotics and Sulfonamide derivatives  Family History  Problem Relation Age of Onset  . Throat cancer Mother   . Stroke Father     Social History Social History  Substance Use Topics  . Smoking status: Former Research scientist (life sciences)  . Smokeless tobacco: Never Used     Comment: quit 50 years ago  . Alcohol Use: No    Review of Systems Constitutional: No fever/chills Eyes: No visual changes. ENT: No sore throat. No stiff neck no neck pain Cardiovascular: Denies chest pain. Respiratory: Denies shortness of breath. Gastrointestinal:   no vomiting.  No diarrhea.  No constipation. Genitourinary: Negative for dysuria. Musculoskeletal: Negative lower extremity swelling Skin: Negative for rash. Neurological: Negative for headaches, focal weakness or numbness. 10-point ROS otherwise negative.  ____________________________________________   PHYSICAL EXAM:  VITAL SIGNS: ED Triage Vitals  Enc Vitals Group     BP 06/14/15 1056 139/68 mmHg      Pulse Rate 06/14/15 1056 67     Resp 06/14/15 1056 20     Temp 06/14/15 1056 97.8 F (36.6 C)     Temp Source 06/14/15 1056 Oral     SpO2 06/14/15 1056 98 %     Weight 06/14/15 1056 143 lb (64.864 kg)     Height 06/14/15 1056 5\' 5"  (1.651 m)     Head Cir --      Peak Flow --      Pain Score 06/14/15 1100 7     Pain Loc --      Pain Edu? --      Excl. in [place]? --     Constitutional: Alert and oriented. Well appearing and in no acute distress. Eyes: Conjunctivae are normal. PERRL. EOMI. Head: Slight bruise to the occiput but no bleeding or skull fracture noted. Nose: No congestion/rhinnorhea. Mouth/Throat: Mucous membranes are moist.  Oropharynx non-erythematous. Neck: No stridor.   Nontender with no meningismus Cardiovascular: Normal rate, regular rhythm. Grossly normal heart sounds.  Good peripheral circulation. Respiratory: Normal respiratory effort.  No retractions. Lungs CTAB. Abdominal: Soft and nontender. No distention. No guarding no rebound Back:  There is no focal tenderness or step off there is no midline tenderness there are no lesions noted. there is no CVA tenderness Musculoskeletal: There is some bruising to the second toe  bilaterally, no ankle discomfort the right leg is nontender with no ankle or knee pain and full range of motion, she states her right hip is a little bit sore but it is not appear to be injured otherwise. The patient has strong distal pulses. On the left side she has bruising to the second toe, there is no ankle pain or deformity, there is no pretibial pain or deformity, the patient does have pain with ranging of the knee but it does not appear to be obviously deformed or ligamentously lax. There is also some minimal tenderness to palpation of the left hip with some discomfort with ranging the hip as well but again no obvious fracture noted. She has full range of motion of the right shoulder is minimally tender to palpation with no evidence of deformity or  step-off.  Neurologic:  Normal speech and language. No gross focal neurologic deficits are appreciated.  Skin:  Skin is warm, dry and intact. No rash noted. Psychiatric: Mood and affect are normal. Speech and behavior are normal.  ____________________________________________   LABS (all labs ordered are listed, but only abnormal results are displayed)  Labs Reviewed - No data to display ____________________________________________  EKG  I personally interpreted any EKGs ordered by me or triage Normal sinus rhythm rate 65 bpm no acute ST elevation or acute ST depression normal axis ____________________________________________  RADIOLOGY  I reviewed any imaging ordered by me or triage that were performed during my shift ____________________________________________   PROCEDURES  Procedure(s) performed: None  Critical Care performed: None  ____________________________________________   INITIAL IMPRESSION / ASSESSMENT AND PLAN / ED COURSE  Pertinent labs & imaging results that were available during my care of the patient were reviewed by me and considered in my medical decision making (see chart for details).  The patient presents today complaining of a fall that happened yesterday. She is not on blood thinners aside from aspirin. We will obtain a CT scan of her head, she has muscle different areas of discomfort since her fall, we will obtain x-rays of her feet, left knee and left hip right hip and right shoulder although again low suspicion for acute fracture in any major bone at this time certainly an occult hip fracture is possible. We will evaluate the patient closely while she is here. Given this is a non-syncopal fall and do not think that blood work is indicated nor is the urine. Patient is in her normal state of health. She is awake and alert and she declines any pain medication or other intervention aside from imaging at this time. She has no back or neck discomfort  suggestive of an injury to those areas and she is neurologically intact  ----------------------------------------- 1:53 PM on 06/14/2015 -----------------------------------------  Patient remains alert and oriented in no acute distress. Continues to decline pain medication. Able to ambulate with no difficulty using a walker at her baseline. There may be a small crack in one of her toes however there is no evidence of an open fracture and there will be no intervention for this. I think placing the patient in a knee immobilizer for her knee pain or any hard soled shoe for her possible toe fracture would actually greatly increase the risk of fall and I'm reluctant to do that. There is no evidence of ligamentous laxity and I don't think an orthopedic device on her leg would provide any significant stability her comfort in any event. We have referred her to orthopedic surgery and return precautions and  follow-up of the given understood. ____________________________________________   FINAL CLINICAL IMPRESSION(S) / ED DIAGNOSES  Final diagnoses:  None     Schuyler Amor, MD 06/14/15 [place], MD 06/14/15 [phone]

## 2015-06-23 ENCOUNTER — Ambulatory Visit: Payer: Medicare Other | Admitting: Podiatry

## 2015-07-01 ENCOUNTER — Telehealth: Payer: Self-pay | Admitting: Family Medicine

## 2015-07-02 ENCOUNTER — Ambulatory Visit (INDEPENDENT_AMBULATORY_CARE_PROVIDER_SITE_OTHER): Payer: Medicare Other | Admitting: Physician Assistant

## 2015-07-02 ENCOUNTER — Telehealth: Payer: Self-pay | Admitting: Physician Assistant

## 2015-07-02 ENCOUNTER — Encounter: Payer: Self-pay | Admitting: Physician Assistant

## 2015-07-02 VITALS — BP 140/60 | HR 71 | Temp 98.2°F | Resp 18 | Wt 142.6 lb

## 2015-07-02 DIAGNOSIS — L089 Local infection of the skin and subcutaneous tissue, unspecified: Secondary | ICD-10-CM | POA: Diagnosis not present

## 2015-07-02 DIAGNOSIS — B9689 Other specified bacterial agents as the cause of diseases classified elsewhere: Secondary | ICD-10-CM

## 2015-07-02 MED ORDER — DOXYCYCLINE HYCLATE 100 MG PO TABS: 100.0000 mg | ORAL_TABLET | Freq: Two times a day (BID) | ORAL | Status: DC

## 2015-07-02 NOTE — Patient Instructions (Signed)
Wound Care °Taking care of your wound properly can help to prevent pain and infection. It can also help your wound to heal more quickly.  °HOW TO CARE FOR YOUR WOUND  °· Take or apply over-the-counter and prescription medicines only as told by your health care provider. °· If you were prescribed antibiotic medicine, take or apply it as told by your health care provider. Do not stop using the antibiotic even if your condition improves. °· Clean the wound each day or as told by your health care provider. °¨ Wash the wound with mild soap and water. °¨ Rinse the wound with water to remove all soap. °¨ Pat the wound dry with a clean towel. Do not rub it. °· There are many different ways to close and cover a wound. For example, a wound can be covered with stitches (sutures), skin glue, or adhesive strips. Follow instructions from your health care provider about: °¨ How to take care of your wound. °¨ When and how you should change your bandage (dressing). °¨ When you should remove your dressing. °¨ Removing whatever was used to close your wound. °· Check your wound every day for signs of infection. Watch for: °¨ Redness, swelling, or pain. °¨ Fluid, blood, or pus. °· Keep the dressing dry until your health care provider says it can be removed. Do not take baths, swim, use a hot tub, or do anything that would put your wound underwater until your health care provider approves. °· Raise (elevate) the injured area above the level of your heart while you are sitting or lying down. °· Do not scratch or pick at the wound. °· Keep all follow-up visits as told by your health care provider. This is important. °SEEK MEDICAL CARE IF: °· You received a tetanus shot and you have swelling, severe pain, redness, or bleeding at the injection site. °· You have a fever. °· Your pain is not controlled with medicine. °· You have increased redness, swelling, or pain at the site of your wound. °· You have fluid, blood, or pus coming from your  wound. °· You notice a bad smell coming from your wound or your dressing. °SEEK IMMEDIATE MEDICAL CARE IF: °· You have a red streak going away from your wound. °  °This information is not intended to replace advice given to you by your health care provider. Make sure you discuss any questions you have with your health care provider. °  °Document Released: 03/14/2008 Document Revised: 10/20/2014 Document Reviewed: 06/01/2014 °Elsevier Interactive Patient Education ©2016 [employer] ° °

## 2015-07-02 NOTE — Telephone Encounter (Signed)
Pt would like a call back to advise her of the plan for the place on her leg that she was seen for in the office today 07/02/15. Pt stated that she knows she is supposed to have the nurse at [facility] look at it Monday 07/05/15 but she would like to be advised of what to do after that and would like to know if she is supposed to put anything on the area. Please advise. Thanks TNP

## 2015-07-02 NOTE — Progress Notes (Signed)
Patient: Chelsea [name] Female    DOB: [birth year]   82 y.o.   MRN: [id number] Visit Date: 07/02/2015  Today's Provider: Mar Daring, PA-C   Chief Complaint  Patient presents with  . Follow-up   Subjective:    HPI  Follow Up ER Visit  Patient is here for ER follow up.  She was recently seen at [facility] for fall on 06/14/15. Treatment for this included Tylenol for pain. Labs and imaging were obtained with no fracture on her knee, shoulder or hips. She did however have a fracture in the 3rd toe on the right foot. CAT scan reassuring with no bleeds.  She reports good compliance with treatment. She reports this condition is Improved.Patient states it has improved but still hurting. Patient is concern that the scrape on her left leg that occurred during the fall when she scraped it on her rollator.  She feels it is infected.  It is red, swollen with drainage and hot to touch.  She also continues to have pain and discoloration of the 3rd toe on the right foot.  ----------------------------------------------------------------------------------     Allergies  Allergen Reactions  . Sulfa Antibiotics     Other reaction(s): Dizziness  . Sulfonamide Derivatives Other (See Comments)    Reaction:  Dizziness    Previous Medications   ALBUTEROL (PROVENTIL HFA;VENTOLIN HFA) 108 (90 BASE) MCG/ACT INHALER    Inhale 2 puffs into the lungs every 6 (six) hours as needed for wheezing or shortness of breath.    AMLODIPINE (NORVASC) 10 MG TABLET    Take 1 tablet (10 mg total) by mouth daily.   ASPIRIN EC 81 MG TABLET    Take 81 mg by mouth daily.   ATORVASTATIN (LIPITOR) 80 MG TABLET    Take 1 tablet (80 mg total) by mouth daily.   BUDESONIDE-FORMOTEROL (SYMBICORT) 80-4.5 MCG/ACT INHALER    Inhale 2 puffs into the lungs 2 (two) times daily.   CALCIUM CITRATE-VITAMIN D (CITRACAL+D) 315-200 MG-UNIT PER TABLET    Take 1 tablet by mouth daily.   DIAZEPAM (VALIUM) 5 MG TABLET    Take 5 mg by  mouth every 6 (six) hours as needed for anxiety.   FLUTICASONE (FLONASE) 50 MCG/ACT NASAL SPRAY    Place 2 sprays into both nostrils daily.   LANSOPRAZOLE (PREVACID) 30 MG CAPSULE    Take 30 mg by mouth 2 (two) times daily.   LEVOTHYROXINE (SYNTHROID, LEVOTHROID) 75 MCG TABLET    Take 75 mcg by mouth daily before breakfast.   LEVOTHYROXINE (SYNTHROID, LEVOTHROID) 88 MCG TABLET    take 1 tablet by mouth once daily   LORATADINE (CLARITIN) 10 MG TABLET    Take 10 mg by mouth daily.   LORAZEPAM (ATIVAN) 0.5 MG TABLET    Take 0.5 mg by mouth as needed for anxiety.   METAXALONE (SKELAXIN) 800 MG TABLET    Take 1 tablet (800 mg total) by mouth 3 (three) times daily as needed for muscle spasms.   MULTIPLE VITAMINS-MINERALS (PRESERVISION AREDS PO)    Take 1 capsule by mouth daily.    NITROGLYCERIN (NITROSTAT) 0.4 MG SL TABLET    Place 1 tablet (0.4 mg total) under the tongue every 5 (five) minutes as needed.   OXYCODONE-ACETAMINOPHEN (PERCOCET) 10-325 MG PER TABLET    Take 1 tablet by mouth every 6 (six) hours as needed for pain.   POLYETHYLENE GLYCOL (MIRALAX / GLYCOLAX) PACKET    Take 17 g by mouth  daily.   SERTRALINE (ZOLOFT) 100 MG TABLET    Take 1.5 tablets (150 mg total) by mouth daily.   TIOTROPIUM (SPIRIVA HANDIHALER) 18 MCG INHALATION CAPSULE    Place 1 capsule (18 mcg total) into inhaler and inhale daily.   TOLTERODINE (DETROL LA) 4 MG 24 HR CAPSULE    Take 4 mg by mouth daily.   VITAMIN C (ASCORBIC ACID) 250 MG TABLET    Take 250 mg by mouth daily.    Review of Systems  Constitutional: Negative for fever, chills and fatigue.  HENT: Negative.   Eyes: Negative.   Respiratory: Positive for cough (Has been taking robitussin. Patient states feeling much better.). Negative for chest tightness, shortness of breath and wheezing.   Cardiovascular: Negative.   Gastrointestinal: Negative.   Endocrine: Negative.   Genitourinary: Negative.   Musculoskeletal: Positive for myalgias and arthralgias.  Negative for back pain, neck pain and neck stiffness.  Skin: Positive for color change and wound (scrape on left leg).  Allergic/Immunologic: Negative.   Neurological: Negative.   Hematological: Negative.   Psychiatric/Behavioral: Negative.   All other systems reviewed and are negative.   Social History  Substance Use Topics  . Smoking status: Former Research scientist (life sciences)  . Smokeless tobacco: Never Used     Comment: quit 50 years ago  . Alcohol Use: No   Objective:   BP 140/60 mmHg  Pulse 71  Temp(Src) 98.2 F (36.8 C) (Oral)  Resp 18  Wt 142 lb 9.6 oz (64.683 kg)  SpO2 96%  Physical Exam  Constitutional: She appears well-developed and well-nourished. No distress.  Cardiovascular: Normal rate, regular rhythm and normal heart sounds.  Exam reveals no gallop and no friction rub.   No murmur heard. Pulses:      Dorsalis pedis pulses are 2+ on the right side, and 2+ on the left side.  Pulmonary/Chest: Effort normal and breath sounds normal. No respiratory distress. She has no wheezes. She has no rales.  Musculoskeletal:  Swelling, discoloration and TTP of the 3rd toe on the right foot.  Skin: Abrasion noted. She is not diaphoretic. There is erythema.     Vitals reviewed.       Assessment & Plan:     1. Superficial bacterial skin infection Will treat with doxycycline as below. Advised her of appropriate wound care. She is to go see the nurse at [facility] on Monday to have the wound re-evaluated.  If pain, swelling or redness worsen she is to call the office. If not I will see her back in 2 weeks to recheck. - doxycycline (VIBRA-TABS) 100 MG tablet; Take 1 tablet (100 mg total) by mouth 2 (two) times daily.  Dispense: 20 tablet; Refill: 0       Mar Daring, PA-C  [facility]

## 2015-07-02 NOTE — Telephone Encounter (Signed)
Please advise. Thanks.  

## 2015-07-02 NOTE — Telephone Encounter (Signed)
She needs to wash it daily with warm soap and water.  Pat dry and cover with bandaid.  She may apply a thin layer of antibiotic ointment if she would like to prevent bandaid from sticking to the wound.

## 2015-07-05 NOTE — Telephone Encounter (Signed)
Advised patient as below.  

## 2015-07-08 DIAGNOSIS — H903 Sensorineural hearing loss, bilateral: Secondary | ICD-10-CM | POA: Diagnosis not present

## 2015-07-08 DIAGNOSIS — H6123 Impacted cerumen, bilateral: Secondary | ICD-10-CM | POA: Diagnosis not present

## 2015-07-14 ENCOUNTER — Ambulatory Visit: Payer: Medicare Other

## 2015-07-14 ENCOUNTER — Encounter: Payer: Self-pay | Admitting: Podiatry

## 2015-07-14 ENCOUNTER — Ambulatory Visit (INDEPENDENT_AMBULATORY_CARE_PROVIDER_SITE_OTHER): Payer: Medicare Other | Admitting: Podiatry

## 2015-07-14 DIAGNOSIS — Q828 Other specified congenital malformations of skin: Secondary | ICD-10-CM | POA: Diagnosis not present

## 2015-07-14 DIAGNOSIS — M79676 Pain in unspecified toe(s): Secondary | ICD-10-CM

## 2015-07-14 DIAGNOSIS — B351 Tinea unguium: Secondary | ICD-10-CM

## 2015-07-14 DIAGNOSIS — M79674 Pain in right toe(s): Secondary | ICD-10-CM

## 2015-07-14 NOTE — Progress Notes (Signed)
She presents today for a chief complaint of painful elongated toenails and calluses bilateral.  Objective: Vital signs stable alert and oriented 3. No erythema edema saline as drainage or odor. Toenails are thick yellow dystrophic with mycotic multiple porokeratotic lesions bilateral foot. No open lesions are noted.  Assessment: Pain in limb secondary to onychomycosis and porokeratosis.  Plan: Debridement of her toenails and calluses bilateral.

## 2015-07-15 ENCOUNTER — Ambulatory Visit: Payer: Medicare Other | Admitting: Physician Assistant

## 2015-07-19 ENCOUNTER — Ambulatory Visit
Admission: RE | Admit: 2015-07-19 | Discharge: 2015-07-19 | Disposition: A | Discharge status: Home / Self Care | Payer: Medicare Other | Source: Ambulatory Visit | Attending: Physician Assistant | Admitting: Physician Assistant

## 2015-07-19 DIAGNOSIS — M810 Age-related osteoporosis without current pathological fracture: Secondary | ICD-10-CM | POA: Insufficient documentation

## 2015-07-21 ENCOUNTER — Telehealth: Payer: Self-pay | Admitting: Physician Assistant

## 2015-07-21 DIAGNOSIS — M810 Age-related osteoporosis without current pathological fracture: Secondary | ICD-10-CM

## 2015-07-21 NOTE — Telephone Encounter (Signed)
Please notify patient that bone mineral density study did show significant osteoporosis. I did discuss this with Dr. Rosanna Randy and and I agree that a referral to endocrinology for further evaluation and treatment of osteoporosis with newer injectable medications for osteoporosis that can be done less frequently than oral medications. The injectables also have less side effects than oral medications as well. I will go ahead and make this referral for you. If you choose to not take the referral please let us know.

## 2015-07-21 NOTE — Telephone Encounter (Signed)
Yes that should be fine. I will forward this to Judson Roch so she can see to call before scheduling. Thanks.   Sarah see below.

## 2015-07-21 NOTE — Telephone Encounter (Signed)
Advised patient as below. Patient reports that she is willing to see the endocrinologist. She is also requesting if Judson Roch could call her before she makes the appt due to her having other doctor appts already scheduled. Thanks!

## 2015-07-27 DIAGNOSIS — M4806 Spinal stenosis, lumbar region: Secondary | ICD-10-CM | POA: Diagnosis not present

## 2015-07-27 DIAGNOSIS — M5416 Radiculopathy, lumbar region: Secondary | ICD-10-CM | POA: Diagnosis not present

## 2015-08-02 DIAGNOSIS — L219 Seborrheic dermatitis, unspecified: Secondary | ICD-10-CM | POA: Diagnosis not present

## 2015-08-02 DIAGNOSIS — L821 Other seborrheic keratosis: Secondary | ICD-10-CM | POA: Diagnosis not present

## 2015-08-02 DIAGNOSIS — L570 Actinic keratosis: Secondary | ICD-10-CM | POA: Diagnosis not present

## 2015-08-05 ENCOUNTER — Encounter: Payer: Self-pay | Admitting: Emergency Medicine

## 2015-08-05 ENCOUNTER — Observation Stay
Admission: EM | Admit: 2015-08-05 | Discharge: 2015-08-06 | Disposition: A | Discharge status: Home / Self Care | Payer: Medicare Other | Attending: Internal Medicine | Admitting: Internal Medicine

## 2015-08-05 ENCOUNTER — Observation Stay: Payer: Medicare Other

## 2015-08-05 DIAGNOSIS — M5116 Intervertebral disc disorders with radiculopathy, lumbar region: Secondary | ICD-10-CM

## 2015-08-05 DIAGNOSIS — Z951 Presence of aortocoronary bypass graft: Secondary | ICD-10-CM | POA: Insufficient documentation

## 2015-08-05 DIAGNOSIS — M79609 Pain in unspecified limb: Secondary | ICD-10-CM | POA: Diagnosis not present

## 2015-08-05 DIAGNOSIS — M064 Inflammatory polyarthropathy: Secondary | ICD-10-CM | POA: Diagnosis not present

## 2015-08-05 DIAGNOSIS — M1990 Unspecified osteoarthritis, unspecified site: Secondary | ICD-10-CM | POA: Diagnosis not present

## 2015-08-05 DIAGNOSIS — M4806 Spinal stenosis, lumbar region: Secondary | ICD-10-CM | POA: Diagnosis not present

## 2015-08-05 DIAGNOSIS — Z823 Family history of stroke: Secondary | ICD-10-CM | POA: Insufficient documentation

## 2015-08-05 DIAGNOSIS — E785 Hyperlipidemia, unspecified: Secondary | ICD-10-CM | POA: Insufficient documentation

## 2015-08-05 DIAGNOSIS — M7989 Other specified soft tissue disorders: Secondary | ICD-10-CM | POA: Diagnosis not present

## 2015-08-05 DIAGNOSIS — K5903 Drug induced constipation: Secondary | ICD-10-CM | POA: Insufficient documentation

## 2015-08-05 DIAGNOSIS — M25542 Pain in joints of left hand: Secondary | ICD-10-CM | POA: Insufficient documentation

## 2015-08-05 DIAGNOSIS — M25531 Pain in right wrist: Secondary | ICD-10-CM | POA: Insufficient documentation

## 2015-08-05 DIAGNOSIS — E875 Hyperkalemia: Secondary | ICD-10-CM | POA: Diagnosis not present

## 2015-08-05 DIAGNOSIS — I341 Nonrheumatic mitral (valve) prolapse: Secondary | ICD-10-CM | POA: Insufficient documentation

## 2015-08-05 DIAGNOSIS — Z90710 Acquired absence of both cervix and uterus: Secondary | ICD-10-CM | POA: Diagnosis not present

## 2015-08-05 DIAGNOSIS — M19049 Primary osteoarthritis, unspecified hand: Secondary | ICD-10-CM | POA: Insufficient documentation

## 2015-08-05 DIAGNOSIS — Z96641 Presence of right artificial hip joint: Secondary | ICD-10-CM | POA: Insufficient documentation

## 2015-08-05 DIAGNOSIS — K589 Irritable bowel syndrome without diarrhea: Secondary | ICD-10-CM | POA: Insufficient documentation

## 2015-08-05 DIAGNOSIS — G8929 Other chronic pain: Secondary | ICD-10-CM | POA: Insufficient documentation

## 2015-08-05 DIAGNOSIS — K449 Diaphragmatic hernia without obstruction or gangrene: Secondary | ICD-10-CM | POA: Insufficient documentation

## 2015-08-05 DIAGNOSIS — M8580 Other specified disorders of bone density and structure, unspecified site: Secondary | ICD-10-CM | POA: Diagnosis not present

## 2015-08-05 DIAGNOSIS — M4802 Spinal stenosis, cervical region: Secondary | ICD-10-CM | POA: Insufficient documentation

## 2015-08-05 DIAGNOSIS — K529 Noninfective gastroenteritis and colitis, unspecified: Secondary | ICD-10-CM | POA: Insufficient documentation

## 2015-08-05 DIAGNOSIS — R531 Weakness: Secondary | ICD-10-CM | POA: Insufficient documentation

## 2015-08-05 DIAGNOSIS — Z87891 Personal history of nicotine dependence: Secondary | ICD-10-CM | POA: Insufficient documentation

## 2015-08-05 DIAGNOSIS — J309 Allergic rhinitis, unspecified: Secondary | ICD-10-CM | POA: Insufficient documentation

## 2015-08-05 DIAGNOSIS — Z882 Allergy status to sulfonamides status: Secondary | ICD-10-CM | POA: Insufficient documentation

## 2015-08-05 DIAGNOSIS — R0989 Other specified symptoms and signs involving the circulatory and respiratory systems: Secondary | ICD-10-CM | POA: Diagnosis not present

## 2015-08-05 DIAGNOSIS — M4856XA Collapsed vertebra, not elsewhere classified, lumbar region, initial encounter for fracture: Secondary | ICD-10-CM | POA: Diagnosis not present

## 2015-08-05 DIAGNOSIS — I6529 Occlusion and stenosis of unspecified carotid artery: Secondary | ICD-10-CM | POA: Insufficient documentation

## 2015-08-05 DIAGNOSIS — K5790 Diverticulosis of intestine, part unspecified, without perforation or abscess without bleeding: Secondary | ICD-10-CM | POA: Diagnosis not present

## 2015-08-05 DIAGNOSIS — M19041 Primary osteoarthritis, right hand: Secondary | ICD-10-CM | POA: Diagnosis not present

## 2015-08-05 DIAGNOSIS — M810 Age-related osteoporosis without current pathological fracture: Secondary | ICD-10-CM | POA: Diagnosis not present

## 2015-08-05 DIAGNOSIS — M79641 Pain in right hand: Secondary | ICD-10-CM

## 2015-08-05 DIAGNOSIS — Z8673 Personal history of transient ischemic attack (TIA), and cerebral infarction without residual deficits: Secondary | ICD-10-CM | POA: Insufficient documentation

## 2015-08-05 DIAGNOSIS — I959 Hypotension, unspecified: Secondary | ICD-10-CM | POA: Insufficient documentation

## 2015-08-05 DIAGNOSIS — E7800 Pure hypercholesterolemia, unspecified: Secondary | ICD-10-CM | POA: Diagnosis not present

## 2015-08-05 DIAGNOSIS — J449 Chronic obstructive pulmonary disease, unspecified: Secondary | ICD-10-CM | POA: Diagnosis not present

## 2015-08-05 DIAGNOSIS — X58XXXA Exposure to other specified factors, initial encounter: Secondary | ICD-10-CM | POA: Insufficient documentation

## 2015-08-05 DIAGNOSIS — R001 Bradycardia, unspecified: Secondary | ICD-10-CM | POA: Diagnosis not present

## 2015-08-05 DIAGNOSIS — Z800 Family history of malignant neoplasm of digestive organs: Secondary | ICD-10-CM | POA: Insufficient documentation

## 2015-08-05 DIAGNOSIS — I10 Essential (primary) hypertension: Secondary | ICD-10-CM | POA: Insufficient documentation

## 2015-08-05 DIAGNOSIS — K210 Gastro-esophageal reflux disease with esophagitis: Secondary | ICD-10-CM | POA: Insufficient documentation

## 2015-08-05 DIAGNOSIS — J8410 Pulmonary fibrosis, unspecified: Secondary | ICD-10-CM | POA: Diagnosis not present

## 2015-08-05 DIAGNOSIS — M130 Polyarthritis, unspecified: Secondary | ICD-10-CM | POA: Diagnosis present

## 2015-08-05 DIAGNOSIS — B351 Tinea unguium: Secondary | ICD-10-CM | POA: Diagnosis not present

## 2015-08-05 DIAGNOSIS — E039 Hypothyroidism, unspecified: Secondary | ICD-10-CM | POA: Diagnosis not present

## 2015-08-05 DIAGNOSIS — T402X5A Adverse effect of other opioids, initial encounter: Secondary | ICD-10-CM | POA: Insufficient documentation

## 2015-08-05 DIAGNOSIS — I2510 Atherosclerotic heart disease of native coronary artery without angina pectoris: Secondary | ICD-10-CM | POA: Insufficient documentation

## 2015-08-05 DIAGNOSIS — Y939 Activity, unspecified: Secondary | ICD-10-CM | POA: Insufficient documentation

## 2015-08-05 DIAGNOSIS — S2241XA Multiple fractures of ribs, right side, initial encounter for closed fracture: Secondary | ICD-10-CM | POA: Diagnosis not present

## 2015-08-05 DIAGNOSIS — M549 Dorsalgia, unspecified: Secondary | ICD-10-CM | POA: Insufficient documentation

## 2015-08-05 DIAGNOSIS — M659 Synovitis and tenosynovitis, unspecified: Principal | ICD-10-CM | POA: Insufficient documentation

## 2015-08-05 DIAGNOSIS — K5792 Diverticulitis of intestine, part unspecified, without perforation or abscess without bleeding: Secondary | ICD-10-CM | POA: Insufficient documentation

## 2015-08-05 DIAGNOSIS — M79642 Pain in left hand: Secondary | ICD-10-CM

## 2015-08-05 DIAGNOSIS — M5136 Other intervertebral disc degeneration, lumbar region: Secondary | ICD-10-CM | POA: Insufficient documentation

## 2015-08-05 DIAGNOSIS — M25541 Pain in joints of right hand: Secondary | ICD-10-CM | POA: Diagnosis not present

## 2015-08-05 DIAGNOSIS — J45909 Unspecified asthma, uncomplicated: Secondary | ICD-10-CM | POA: Diagnosis not present

## 2015-08-05 LAB — CBC
HCT: 32.2 % — ABNORMAL LOW (ref 35.0–47.0)
Hemoglobin: 10.8 g/dL — ABNORMAL LOW (ref 12.0–16.0)
MCH: 27.9 pg (ref 26.0–34.0)
MCHC: 33.6 g/dL (ref 32.0–36.0)
MCV: 83 fL (ref 80.0–100.0)
Platelets: 290 K/uL (ref 150–440)
RBC: 3.88 MIL/uL (ref 3.80–5.20)
RDW: 16.9 % — ABNORMAL HIGH (ref 11.5–14.5)
WBC: 9.7 K/uL (ref 3.6–11.0)

## 2015-08-05 LAB — URIC ACID: Uric Acid, Serum: 5.6 mg/dL (ref 2.3–6.6)

## 2015-08-05 LAB — COMPREHENSIVE METABOLIC PANEL WITH GFR
ALT: 13 U/L — ABNORMAL LOW (ref 14–54)
AST: 16 U/L (ref 15–41)
Albumin: 3.2 g/dL — ABNORMAL LOW (ref 3.5–5.0)
Alkaline Phosphatase: 85 U/L (ref 38–126)
Anion gap: 7 (ref 5–15)
BUN: 32 mg/dL — ABNORMAL HIGH (ref 6–20)
CO2: 21 mmol/L — ABNORMAL LOW (ref 22–32)
Calcium: 8.6 mg/dL — ABNORMAL LOW (ref 8.9–10.3)
Chloride: 105 mmol/L (ref 101–111)
Creatinine, Ser: 0.93 mg/dL (ref 0.44–1.00)
GFR calc Af Amer: >60 mL/min
GFR calc non Af Amer: 53 mL/min — ABNORMAL LOW
Glucose, Bld: 128 mg/dL — ABNORMAL HIGH (ref 65–99)
Potassium: 4.7 mmol/L (ref 3.5–5.1)
Sodium: 133 mmol/L — ABNORMAL LOW (ref 135–145)
Total Bilirubin: 0.2 mg/dL — ABNORMAL LOW (ref 0.3–1.2)
Total Protein: 6.5 g/dL (ref 6.5–8.1)

## 2015-08-05 LAB — URINALYSIS COMPLETE WITH MICROSCOPIC (ARMC ONLY)
BACTERIA UA: NONE SEEN
BILIRUBIN URINE: NEGATIVE
GLUCOSE, UA: NEGATIVE mg/dL
Hgb urine dipstick: NEGATIVE
Ketones, ur: NEGATIVE mg/dL
NITRITE: NEGATIVE
Protein, ur: NEGATIVE mg/dL
RBC / HPF: NONE SEEN RBC/hpf (ref 0–5)
SPECIFIC GRAVITY, URINE: 1.015 (ref 1.005–1.030)
Squamous Epithelial / LPF: NONE SEEN
pH: 5 (ref 5.0–8.0)

## 2015-08-05 LAB — SEDIMENTATION RATE
SED RATE: 60 mm/h — AB (ref 0–30)
SED RATE: 58 mm/h — AB (ref 0–30)

## 2015-08-05 LAB — MRSA PCR SCREENING: MRSA BY PCR: NEGATIVE

## 2015-08-05 MED ORDER — OXYCODONE-ACETAMINOPHEN 5-325 MG PO TABS: 1.0000 | ORAL_TABLET | Freq: Once | ORAL | Status: DC

## 2015-08-05 MED ORDER — ONDANSETRON HCL 4 MG/2ML IJ SOLN
4.0000 mg | Freq: Once | INTRAMUSCULAR | Status: AC
  Administered 2015-08-05: 4 mg via INTRAVENOUS
  Filled 2015-08-05: qty 2

## 2015-08-05 MED ORDER — MORPHINE SULFATE (PF) 4 MG/ML IV SOLN: 4.0000 mg | INTRAVENOUS | Status: DC | PRN

## 2015-08-05 MED ORDER — HYDROMORPHONE HCL 1 MG/ML IJ SOLN
1.0000 mg | Freq: Once | INTRAMUSCULAR | Status: AC
  Administered 2015-08-05: 1 mg via INTRAVENOUS
  Filled 2015-08-05: qty 1

## 2015-08-05 MED ORDER — ALBUTEROL SULFATE HFA 108 (90 BASE) MCG/ACT IN AERS: 2.0000 | INHALATION_SPRAY | Freq: Four times a day (QID) | RESPIRATORY_TRACT | Status: DC | PRN

## 2015-08-05 MED ORDER — MORPHINE SULFATE (PF) 2 MG/ML IV SOLN
2.0000 mg | Freq: Once | INTRAVENOUS | Status: AC
  Administered 2015-08-05: 2 mg via INTRAVENOUS
  Filled 2015-08-05: qty 1

## 2015-08-05 MED ORDER — PANTOPRAZOLE SODIUM 40 MG PO TBEC
40.0000 mg | DELAYED_RELEASE_TABLET | Freq: Every day | ORAL | Status: DC
  Administered 2015-08-05 – 2015-08-06 (×2): 40 mg via ORAL
  Filled 2015-08-05 (×2): qty 1

## 2015-08-05 MED ORDER — METHYLPREDNISOLONE SODIUM SUCC 125 MG IJ SOLR
125.0000 mg | Freq: Once | INTRAMUSCULAR | Status: AC
  Administered 2015-08-05: 125 mg via INTRAVENOUS

## 2015-08-05 MED ORDER — METHYLPREDNISOLONE SODIUM SUCC 125 MG IJ SOLR
INTRAMUSCULAR | Status: AC
  Administered 2015-08-05: 125 mg via INTRAVENOUS
  Filled 2015-08-05: qty 2

## 2015-08-05 MED ORDER — ASPIRIN EC 81 MG PO TBEC
81.0000 mg | DELAYED_RELEASE_TABLET | Freq: Every day | ORAL | Status: DC
  Administered 2015-08-05 – 2015-08-06 (×2): 81 mg via ORAL
  Filled 2015-08-05 (×2): qty 1

## 2015-08-05 MED ORDER — SENNA 8.6 MG PO TABS
1.0000 | ORAL_TABLET | ORAL | Status: DC | PRN
  Administered 2015-08-05: 8.6 mg via ORAL
  Filled 2015-08-05: qty 1

## 2015-08-05 MED ORDER — ACETAMINOPHEN 325 MG PO TABS: 650.0000 mg | ORAL_TABLET | Freq: Four times a day (QID) | ORAL | Status: DC | PRN

## 2015-08-05 MED ORDER — ALBUTEROL SULFATE (2.5 MG/3ML) 0.083% IN NEBU: 2.5000 mg | INHALATION_SOLUTION | Freq: Four times a day (QID) | RESPIRATORY_TRACT | Status: DC | PRN

## 2015-08-05 MED ORDER — FLUTICASONE PROPIONATE 50 MCG/ACT NA SUSP
2.0000 | Freq: Every day | NASAL | Status: DC
  Filled 2015-08-05: qty 16

## 2015-08-05 MED ORDER — METAXALONE 800 MG PO TABS: 800.0000 mg | ORAL_TABLET | Freq: Three times a day (TID) | ORAL | Status: DC | PRN

## 2015-08-05 MED ORDER — SERTRALINE HCL 50 MG PO TABS
150.0000 mg | ORAL_TABLET | Freq: Every day | ORAL | Status: DC
  Administered 2015-08-05: 50 mg via ORAL
  Administered 2015-08-06: 150 mg via ORAL
  Filled 2015-08-05 (×2): qty 1

## 2015-08-05 MED ORDER — FLUTICASONE FUROATE-VILANTEROL 100-25 MCG/INH IN AEPB: 1.0000 | INHALATION_SPRAY | Freq: Every day | RESPIRATORY_TRACT | Status: DC

## 2015-08-05 MED ORDER — CALCIUM CITRATE-VITAMIN D 500-400 MG-UNIT PO CHEW
1.0000 | CHEWABLE_TABLET | Freq: Every day | ORAL | Status: DC
  Administered 2015-08-05 – 2015-08-06 (×2): 1 via ORAL
  Filled 2015-08-05 (×2): qty 1

## 2015-08-05 MED ORDER — OXYBUTYNIN CHLORIDE 5 MG PO TABS
5.0000 mg | ORAL_TABLET | Freq: Every day | ORAL | Status: DC
  Administered 2015-08-05: 5 mg via ORAL
  Filled 2015-08-05: qty 1

## 2015-08-05 MED ORDER — CALCIUM CITRATE-VITAMIN D 315-200 MG-UNIT PO TABS: 1.0000 | ORAL_TABLET | Freq: Every day | ORAL | Status: DC

## 2015-08-05 MED ORDER — ONDANSETRON HCL 4 MG/2ML IJ SOLN: 4.0000 mg | Freq: Four times a day (QID) | INTRAMUSCULAR | Status: DC | PRN

## 2015-08-05 MED ORDER — OXYCODONE-ACETAMINOPHEN 5-325 MG PO TABS
ORAL_TABLET | ORAL | Status: AC
  Filled 2015-08-05: qty 1

## 2015-08-05 MED ORDER — LEVOTHYROXINE SODIUM 88 MCG PO TABS
88.0000 ug | ORAL_TABLET | Freq: Every day | ORAL | Status: DC
  Administered 2015-08-06: 88 ug via ORAL
  Filled 2015-08-05 (×3): qty 1

## 2015-08-05 MED ORDER — TIOTROPIUM BROMIDE MONOHYDRATE 18 MCG IN CAPS
18.0000 ug | ORAL_CAPSULE | Freq: Every day | RESPIRATORY_TRACT | Status: DC
  Administered 2015-08-05 – 2015-08-06 (×2): 18 ug via RESPIRATORY_TRACT
  Filled 2015-08-05: qty 5

## 2015-08-05 MED ORDER — NITROGLYCERIN 0.4 MG SL SUBL: 0.4000 mg | SUBLINGUAL_TABLET | SUBLINGUAL | Status: DC | PRN

## 2015-08-05 MED ORDER — ONDANSETRON HCL 4 MG PO TABS
4.0000 mg | ORAL_TABLET | Freq: Four times a day (QID) | ORAL | Status: DC | PRN
Start: 2015-08-05 — End: 2015-08-06

## 2015-08-05 MED ORDER — ACETAMINOPHEN 650 MG RE SUPP: 650.0000 mg | Freq: Four times a day (QID) | RECTAL | Status: DC | PRN

## 2015-08-05 MED ORDER — SODIUM CHLORIDE 0.9 % IV BOLUS (SEPSIS)
250.0000 mL | Freq: Once | INTRAVENOUS | Status: AC
  Administered 2015-08-05: 250 mL via INTRAVENOUS

## 2015-08-05 MED ORDER — MOMETASONE FURO-FORMOTEROL FUM 100-5 MCG/ACT IN AERO
2.0000 | INHALATION_SPRAY | Freq: Two times a day (BID) | RESPIRATORY_TRACT | Status: DC
  Administered 2015-08-05 – 2015-08-06 (×3): 2 via RESPIRATORY_TRACT
  Filled 2015-08-05: qty 8.8

## 2015-08-05 MED ORDER — KETOROLAC TROMETHAMINE 30 MG/ML IJ SOLN
15.0000 mg | Freq: Once | INTRAMUSCULAR | Status: AC
  Administered 2015-08-05: 15 mg via INTRAVENOUS
  Filled 2015-08-05: qty 1

## 2015-08-05 MED ORDER — ATORVASTATIN CALCIUM 20 MG PO TABS
80.0000 mg | ORAL_TABLET | Freq: Every day | ORAL | Status: DC
  Administered 2015-08-05 – 2015-08-06 (×2): 80 mg via ORAL
  Filled 2015-08-05 (×2): qty 4

## 2015-08-05 MED ORDER — ALBUTEROL SULFATE (2.5 MG/3ML) 0.083% IN NEBU: 2.5000 mg | INHALATION_SOLUTION | RESPIRATORY_TRACT | Status: DC | PRN

## 2015-08-05 MED ORDER — ENOXAPARIN SODIUM 40 MG/0.4ML ~~LOC~~ SOLN
40.0000 mg | SUBCUTANEOUS | Status: DC
  Administered 2015-08-05 – 2015-08-06 (×2): 40 mg via SUBCUTANEOUS
  Filled 2015-08-05 (×2): qty 0.4

## 2015-08-05 MED ORDER — OXYCODONE HCL 5 MG PO TABS
5.0000 mg | ORAL_TABLET | ORAL | Status: DC | PRN
  Administered 2015-08-05 (×2): 5 mg via ORAL
  Filled 2015-08-05 (×2): qty 1

## 2015-08-05 MED ORDER — PREDNISONE 50 MG PO TABS
50.0000 mg | ORAL_TABLET | Freq: Every day | ORAL | Status: DC
  Administered 2015-08-06: 50 mg via ORAL
  Filled 2015-08-05: qty 1

## 2015-08-05 NOTE — Clinical Social Work Note (Signed)
Lives in [place] Assessment  Patient Details  Name: Chelsea [name] MRN: [id number] Date of Birth: 06/17/1924  Date of referral:  08/05/15               Reason for consult:       Patient may require higher level of care             Permission sought to share information with:  Facility Sport and exercise psychologist (Patient does not want her daughter contacted as she is on her way to vacation) Permission granted to share information::  Yes, Verbal Permission Granted  Name::     [place]::  [facility]  Relationship::   Lives independent facility  Contact Information:     Housing/Transportation Living arrangements for the past 2 months:  Charity fundraiser of Information:  Patient Patient Interpreter Needed:  None Criminal Activity/Legal Involvement Pertinent to Current Situation/Hospitalization:   none Significant Relationships:  Adult Children Lives with:  Self Do you feel safe going back to the place where you live?  Yes Need for family participation in patient care:  Yes (Comment)  Care giving concerns:  May need to go to assisted living   Facilities manager / plan: Complete assessment and start Fl2 Employment status:  Retired, [place] information:  Medicare, Other (Comment Required) Nurse, mental health) PT Recommendations:   tbd Information / Referral to community resources:   none required  Patient/Family's Response to care:  Not to be contacted at this time  Patient/Family's Understanding of and Emotional Response to Diagnosis, Current Treatment, and Prognosis:  Patient did not want her daughter contacted as she was on her way to a vacation.  Emotional Assessment Appearance:  Appears younger than stated age Attitude/Demeanor/Rapport:    Affect (typically observed):  Accepting, Adaptable, Calm, Hopeful Orientation:  Oriented to Self, Oriented to  Time, Oriented to Place, Oriented to Situation Alcohol / Substance use:  Never  Used Psych involvement (Current and /or in the community):  Yes (Comment)  Discharge Needs  Concerns to be addressed:  No discharge needs identified Readmission within the last 30 days:  No Current discharge risk:  None Barriers to Discharge:  No Barriers Identified   Joana Reamer, LCSW 08/05/2015, 8:56 AM

## 2015-08-05 NOTE — Progress Notes (Signed)
Notified Dr. Bridgett Larsson that patient is having trouble urinating. Patient reports that this has been happening over the last couple days. Bladder scan yielded 350 ml of urine. Order received for in and out cath and urinalysis

## 2015-08-05 NOTE — ED Notes (Signed)
Patient brought in by ems from [facility]. Patient with complaint of pain to bilateral hands up to her elbows. Patient with redness and swelling to bilateral wrist and knuckles. Patient denies any injury.

## 2015-08-05 NOTE — ED Notes (Signed)
MD at bedside. 

## 2015-08-05 NOTE — Progress Notes (Signed)
IN and OUT Cath for urinary retention  Spec sent to lab

## 2015-08-05 NOTE — Care Management Obs Status (Signed)
[place] NOTIFICATION   Patient Details  Name: Chelsea [name] MRN: [id number] Date of Birth: 1924-04-19   Medicare Observation Status Notification Given:  Yes    Beau Fanny, RN 08/05/2015, 9:42 AM

## 2015-08-05 NOTE — ED Notes (Signed)
Report to Tricia, RN 

## 2015-08-05 NOTE — ED Provider Notes (Signed)
[facility] Emergency Department Provider Note  ____________________________________________  Time seen: 6:42 AM  I have reviewed the triage vital signs and the nursing notes.   HISTORY  Chief Complaint Hand Pain     HPI Chelsea KRUMWIEDE is a 82 y.o. female presents via EMS from [facility] with nontraumatic bilateral hand pain and redness since last night. Patient denies any fever no trauma. Denies any history of rheumatoid arthritis or gout. Patient does admit to osteoarthritis in her hand in the past however the pain was never to this severity. Patient states her current pain score is 10 out of 10 worse with movement or even very gentle palpation    Past Medical History  Diagnosis Date  . Coronary artery disease     a. s/p 2 vessel CABG 1989; b. echo 2003: nl; c. cath 2006: patent grafts, EF 65%; d. nuclear stress test 2007: no ischemia, EF 81%  . Hyperlipidemia   . Hypertension   . Bradycardia     hx of it and fatigue with beta blockade  . History of hyperkalemia   . Carotid artery disease ([place])     s/p carotid endarterectomy  . Stroke [facility]) 1980s    left brain  . Mitral valve prolapse   . Osteoporosis   . Asthma   . Degenerative disc disease   . Spinal stenosis in cervical region   . Norovirus 2014    Patient Active Problem List   Diagnosis Date Noted  . Pulmonary fibrosis ([place]) 05/11/2015  . SOB (shortness of breath)   . Weakness   . 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 Peripheral blood vessel disorder ([place]) 02/09/2015  . 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 OP (osteoporosis) 02/09/2015  . Billowing mitral valve 02/09/2015  . Degeneration macular 02/09/2015  . Hypercholesteremia 02/09/2015  . Adult hypothyroidism 02/09/2015  . Cannot sleep 02/09/2015  . 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 Presence of aortocoronary bypass graft 02/09/2015  . 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 Esophagitis, reflux 02/09/2015  .  Diverticulitis 02/09/2015  . Colon, diverticulosis 02/09/2015  . Constipation due to opioid therapy 02/09/2015  . CAD in native artery 02/09/2015  . Allergic rhinitis 02/09/2015  . Back ache 02/09/2015  . Airway hyperreactivity 02/09/2015  . COPD (chronic obstructive pulmonary disease) ([place]) 01/15/2015  . GERD (gastroesophageal reflux disease) 12/23/2014  . Spinal stenosis at L4-L5 level 12/16/2014  . Lumbar nerve root compression 12/16/2014  . Compression fracture of lumbar vertebra ([place]) 12/16/2014  . Lumbar canal stenosis 11/04/2013  . Neuritis or radiculitis due to rupture of lumbar intervertebral disc 11/04/2013  . DDD (degenerative disc disease), lumbar 11/04/2013  . Degeneration of intervertebral disc of lumbar region 11/04/2013  . Dermatophytosis of nail 03/31/2013  . Pain in limb 03/31/2013  . HTN (hypertension) 07/24/2011  . Hyperlipidemia 01/31/2010  . Carotid artery stenosis 12/21/2009  . CORONARY ATHEROSLERO AUTOL VEIN BYPASS GRAFT 05/25/2009  . Dyspnea on exertion 05/25/2009    Past Surgical History  Procedure Laterality Date  . Coronary artery bypass graft    . Carotid endarterectomy      left  . Cholecystectomy    . Abdominal hysterectomy    . Bladder suspension    . Hip surgery  02/2011    right    Current Outpatient Rx  Name  Route  Sig  Dispense  Refill  . albuterol (PROVENTIL HFA;VENTOLIN HFA) 108 (90 BASE) MCG/ACT inhaler   Inhalation   Inhale 2 puffs into the lungs every  6 (six) hours as needed for wheezing or shortness of breath.          Marland Kitchen amLODipine (NORVASC) 10 MG tablet   Oral   Take 1 tablet (10 mg total) by mouth daily.   90 tablet   3   . aspirin EC 81 MG tablet   Oral   Take 81 mg by mouth daily.         Marland Kitchen atorvastatin (LIPITOR) 80 MG tablet   Oral   Take 1 tablet (80 mg total) by mouth daily.   90 tablet   4   . budesonide-formoterol (SYMBICORT) 80-4.5 MCG/ACT inhaler   Inhalation   Inhale 2 puffs into the lungs 2 (two)  times daily.         . calcium citrate-vitamin D (CITRACAL+D) 315-200 MG-UNIT per tablet   Oral   Take 1 tablet by mouth daily.         . diazepam (VALIUM) 5 MG tablet   Oral   Take 5 mg by mouth every 6 (six) hours as needed for anxiety.         Marland Kitchen doxycycline (VIBRA-TABS) 100 MG tablet   Oral   Take 1 tablet (100 mg total) by mouth 2 (two) times daily.   20 tablet   0   . fluticasone (FLONASE) 50 MCG/ACT nasal spray   Each Nare   Place 2 sprays into both nostrils daily.         . lansoprazole (PREVACID) 30 MG capsule   Oral   Take 30 mg by mouth 2 (two) times daily.         Marland Kitchen levothyroxine (SYNTHROID, LEVOTHROID) 75 MCG tablet   Oral   Take 75 mcg by mouth daily before breakfast.         . levothyroxine (SYNTHROID, LEVOTHROID) 88 MCG tablet      take 1 tablet by mouth once daily   90 tablet   3   . loratadine (CLARITIN) 10 MG tablet   Oral   Take 10 mg by mouth daily.         Marland Kitchen LORazepam (ATIVAN) 0.5 MG tablet   Oral   Take 0.5 mg by mouth as needed for anxiety.         . metaxalone (SKELAXIN) 800 MG tablet   Oral   Take 1 tablet (800 mg total) by mouth 3 (three) times daily as needed for muscle spasms.   15 tablet   0   . Multiple Vitamins-Minerals (PRESERVISION AREDS PO)   Oral   Take 1 capsule by mouth daily.          . nitroGLYCERIN (NITROSTAT) 0.4 MG SL tablet   Sublingual   Place 1 tablet (0.4 mg total) under the tongue every 5 (five) minutes as needed.   25 tablet   6   . oxyCODONE-acetaminophen (PERCOCET) 10-325 MG per tablet   Oral   Take 1 tablet by mouth every 6 (six) hours as needed for pain.   20 tablet   0   . polyethylene glycol (MIRALAX / GLYCOLAX) packet   Oral   Take 17 g by mouth daily.         . sertraline (ZOLOFT) 100 MG tablet   Oral   Take 1.5 tablets (150 mg total) by mouth daily.   45 tablet   3   . tiotropium (SPIRIVA HANDIHALER) 18 MCG inhalation capsule   Inhalation   Place 1 capsule (18 mcg  total) into inhaler  and inhale daily.   90 capsule   0   . tolterodine (DETROL LA) 4 MG 24 hr capsule   Oral   Take 4 mg by mouth daily.         . vitamin C (ASCORBIC ACID) 250 MG tablet   Oral   Take 250 mg by mouth daily.           Allergies Sulfa antibiotics and Sulfonamide derivatives  Family History  Problem Relation Age of Onset  . Throat cancer Mother   . Stroke Father     Social History Social History  Substance Use Topics  . Smoking status: Former Research scientist (life sciences)  . Smokeless tobacco: Never Used     Comment: quit 50 years ago  . Alcohol Use: No    Review of Systems  Constitutional: Negative for fever. Eyes: Negative for visual changes. ENT: Negative for sore throat. Cardiovascular: Negative for chest pain. Respiratory: Negative for shortness of breath. Gastrointestinal: Negative for abdominal pain, vomiting and diarrhea. Genitourinary: Negative for dysuria. Musculoskeletal: Negative for back pain. Positive bilateral hand/wrist pain Skin: Negative for rash. Positive for bilateral hand redness Neurological: Negative for headaches, focal weakness or numbness.   10-point ROS otherwise negative.  ____________________________________________   PHYSICAL EXAM:  VITAL SIGNS: ED Triage Vitals  Enc Vitals Group     BP 08/05/15 0614 159/59 mmHg     Pulse Rate 08/05/15 0611 67     Resp 08/05/15 0611 22     Temp 08/05/15 0611 98.4 F (36.9 C)     Temp Source 08/05/15 0611 Oral     SpO2 08/05/15 0611 100 %     Weight 08/05/15 0611 145 lb (65.772 kg)     Height 08/05/15 0611 5\' 8"  (1.727 m)     Head Cir --      Peak Flow --      Pain Score 08/05/15 0613 10     Pain Loc --      Pain Edu? --      Excl. in [place]? --      Constitutional: Alert and oriented. Well appearing and in no distress. Eyes: Conjunctivae are normal. PERRL. Normal extraocular movements. ENT   Head: Normocephalic and atraumatic.   Nose: No congestion/rhinnorhea.   Mouth/Throat:  Mucous membranes are moist.   Neck: No stridor. Hematological/Lymphatic/Immunilogical: No cervical lymphadenopathy. Cardiovascular: Normal rate, regular rhythm. Normal and symmetric distal pulses are present in all extremities. No murmurs, rubs, or gallops. Respiratory: Normal respiratory effort without tachypnea nor retractions. Breath sounds are clear and equal bilaterally. No wheezes/rales/rhonchi. Gastrointestinal: Soft and nontender. No distention. There is no CVA tenderness. Genitourinary: deferred Musculoskeletal: Pain with passive and active range of motion of the bilateral wrists and hands Neurologic:  Normal speech and language. No gross focal neurologic deficits are appreciated. Speech is normal.  Skin:  Blanching erythema noted bilateral hands standing to wrist Psychiatric: Mood and affect are normal. Speech and behavior are normal. Patient exhibits appropriate insight and judgment.    INITIAL IMPRESSION / ASSESSMENT AND PLAN / ED COURSE  Pertinent labs & imaging results that were available during my care of the patient were reviewed by me and considered in my medical decision making (see chart for details).  Patient's care transferred to Dr. Marcelene Butte  ____________________________________________   FINAL CLINICAL IMPRESSION(S) / ED DIAGNOSES  Final diagnoses:  Chronic pain of right hand  Chronic hand pain, left  DDD (degenerative disc disease), lumbar  Neuritis or radiculitis due to rupture of lumbar intervertebral  disc  Lung crackles      Gregor Hams, MD 08/07/15 [phone]

## 2015-08-05 NOTE — NC FL2 (Signed)
[place] LEVEL OF CARE SCREENING TOOL     IDENTIFICATION  Patient Name: Chelsea [name] Birthdate: 02/16/1924 Sex: female Admission Date (Current Location): 08/05/2015  [place] and [place] Number:  Engineering geologist and Address:  [facility], [address], [place], [place] [address]      Provider Number: [id number]  Attending Physician Name and Address:  Demetrios Loll, MD  Relative Name and Phone Number:       Current Level of Care: Hospital Recommended Level of Care: [place] Prior Approval Number:    Date Approved/Denied:   PASRR Number:  ([id number])  Discharge Plan: SNF    Current Diagnoses: Patient Active Problem List   Diagnosis Date Noted  . Polyarthritis 08/05/2015  . Pulmonary fibrosis ([place]) 05/11/2015  . SOB (shortness of breath)   . Weakness   . 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 Peripheral blood vessel disorder ([place]) 02/09/2015  . 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 OP (osteoporosis) 02/09/2015  . Billowing mitral valve 02/09/2015  . Degeneration macular 02/09/2015  . Hypercholesteremia 02/09/2015  . Adult hypothyroidism 02/09/2015  . Cannot sleep 02/09/2015  . 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 Presence of aortocoronary bypass graft 02/09/2015  . 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 Esophagitis, reflux 02/09/2015  . Diverticulitis 02/09/2015  . Colon, diverticulosis 02/09/2015  . Constipation due to opioid therapy 02/09/2015  . CAD in native artery 02/09/2015  . Allergic rhinitis 02/09/2015  . Back ache 02/09/2015  . Airway hyperreactivity 02/09/2015  . COPD (chronic obstructive pulmonary disease) ([place]) 01/15/2015  . GERD (gastroesophageal reflux disease) 12/23/2014  . Spinal stenosis at L4-L5 level 12/16/2014  . Lumbar nerve root compression 12/16/2014  . Compression fracture of lumbar vertebra ([place]) 12/16/2014  . Lumbar canal stenosis 11/04/2013  . Neuritis or radiculitis due to  rupture of lumbar intervertebral disc 11/04/2013  . DDD (degenerative disc disease), lumbar 11/04/2013  . Degeneration of intervertebral disc of lumbar region 11/04/2013  . Dermatophytosis of nail 03/31/2013  . Pain in limb 03/31/2013  . HTN (hypertension) 07/24/2011  . Hyperlipidemia 01/31/2010  . Carotid artery stenosis 12/21/2009  . CORONARY ATHEROSLERO AUTOL VEIN BYPASS GRAFT 05/25/2009  . Dyspnea on exertion 05/25/2009    Orientation RESPIRATION BLADDER Height & Weight     Self, Time, Situation, Place  O2 (2 Liters Oxygen ) Continent Weight: 145 lb (65.772 kg) Height:  5\' 8"  (172.7 cm)  BEHAVIORAL SYMPTOMS/MOOD NEUROLOGICAL BOWEL NUTRITION STATUS   (none )  (none ) Continent Diet (Regular Diet )  AMBULATORY STATUS COMMUNICATION OF NEEDS Skin   Extensive Assist Verbally Normal                       Personal Care Assistance Level of Assistance  Bathing, Feeding, Dressing Bathing Assistance: Limited assistance Feeding assistance: Independent Dressing Assistance: Limited assistance     Functional Limitations Info  Sight, Hearing, Speech Sight Info: Adequate Hearing Info: Adequate Speech Info: Adequate    SPECIAL CARE FACTORS FREQUENCY  PT (By licensed PT)     PT Frequency:  (5)              Contractures      Additional Factors Info  Code Status, Allergies Code Status Info:  (Full Code. ) Allergies Info:  (Sulfa Antibiotics, Sulfonamide Derivatives)           Current Medications (08/05/2015):  This is the current hospital active medication list Current Facility-Administered Medications  Medication Dose Route Frequency Provider  Last Rate Last Dose  . sodium chloride 0.9 % bolus 250 mL  250 mL Intravenous Once Demetrios Loll, MD         Discharge Medications: Please see discharge summary for a list of discharge medications.  Relevant Imaging Results:  Relevant Lab Results:   Additional Information  (SSN: [id number])  Loralyn Freshwater,  LCSW

## 2015-08-05 NOTE — ED Provider Notes (Signed)
Time Seen: Approximately 0 700 I have reviewed the triage notes  Chief Complaint: Hand Pain   History of Present Illness: Chelsea [name] is a 82 y.o. female who presents with acute onset of bilateral hand joint pain which started last evening at 10:00 p.m. she states it started primarily in her right hand and points mainly to the proximal and middle joints on her right hand where she's noticed discomfort with any movement or touch. She states it's also started now on her left upper extremity in her hand. She denies any fever or trauma. She states that she was started back on Metoxalone as a muscle relaxant but otherwise no new medications. She denies any fever at home. She has degenerative disc disease but denies any history of any other arthritis like syndromes   Past Medical History  Diagnosis Date  . Coronary artery disease     a. s/p 2 vessel CABG 1989; b. echo 2003: nl; c. cath 2006: patent grafts, EF 65%; d. nuclear stress test 2007: no ischemia, EF 81%  . Hyperlipidemia   . Hypertension   . Bradycardia     hx of it and fatigue with beta blockade  . History of hyperkalemia   . Carotid artery disease ([place])     s/p carotid endarterectomy  . Stroke [facility]) 1980s    left brain  . Mitral valve prolapse   . Osteoporosis   . Asthma   . Degenerative disc disease   . Spinal stenosis in cervical region   . Norovirus 2014    Patient Active Problem List   Diagnosis Date Noted  . Pulmonary fibrosis ([place]) 05/11/2015  . SOB (shortness of breath)   . Weakness   . 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 Peripheral blood vessel disorder ([place]) 02/09/2015  . 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 OP (osteoporosis) 02/09/2015  . Billowing mitral valve 02/09/2015  . Degeneration macular 02/09/2015  . Hypercholesteremia 02/09/2015  . Adult hypothyroidism 02/09/2015  . Cannot sleep 02/09/2015  . 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 Presence of aortocoronary bypass graft 02/09/2015  .  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 Esophagitis, reflux 02/09/2015  . Diverticulitis 02/09/2015  . Colon, diverticulosis 02/09/2015  . Constipation due to opioid therapy 02/09/2015  . CAD in native artery 02/09/2015  . Allergic rhinitis 02/09/2015  . Back ache 02/09/2015  . Airway hyperreactivity 02/09/2015  . COPD (chronic obstructive pulmonary disease) ([place]) 01/15/2015  . GERD (gastroesophageal reflux disease) 12/23/2014  . Spinal stenosis at L4-L5 level 12/16/2014  . Lumbar nerve root compression 12/16/2014  . Compression fracture of lumbar vertebra ([place]) 12/16/2014  . Lumbar canal stenosis 11/04/2013  . Neuritis or radiculitis due to rupture of lumbar intervertebral disc 11/04/2013  . DDD (degenerative disc disease), lumbar 11/04/2013  . Degeneration of intervertebral disc of lumbar region 11/04/2013  . Dermatophytosis of nail 03/31/2013  . Pain in limb 03/31/2013  . HTN (hypertension) 07/24/2011  . Hyperlipidemia 01/31/2010  . Carotid artery stenosis 12/21/2009  . CORONARY ATHEROSLERO AUTOL VEIN BYPASS GRAFT 05/25/2009  . Dyspnea on exertion 05/25/2009    Past Surgical History  Procedure Laterality Date  . Coronary artery bypass graft    . Carotid endarterectomy      left  . Cholecystectomy    . Abdominal hysterectomy    . Bladder suspension    . Hip surgery  02/2011    right    Past Surgical History  Procedure Laterality Date  . Coronary artery bypass graft    .  Carotid endarterectomy      left  . Cholecystectomy    . Abdominal hysterectomy    . Bladder suspension    . Hip surgery  02/2011    right    Current Outpatient Rx  Name  Route  Sig  Dispense  Refill  . albuterol (PROVENTIL HFA;VENTOLIN HFA) 108 (90 BASE) MCG/ACT inhaler   Inhalation   Inhale 2 puffs into the lungs every 6 (six) hours as needed for wheezing or shortness of breath.          Marland Kitchen amLODipine (NORVASC) 10 MG tablet   Oral   Take 1 tablet (10 mg total) by mouth daily.   90 tablet   3    . aspirin EC 81 MG tablet   Oral   Take 81 mg by mouth daily.         Marland Kitchen atorvastatin (LIPITOR) 80 MG tablet   Oral   Take 1 tablet (80 mg total) by mouth daily.   90 tablet   4   . budesonide-formoterol (SYMBICORT) 80-4.5 MCG/ACT inhaler   Inhalation   Inhale 2 puffs into the lungs 2 (two) times daily.         . calcium citrate-vitamin D (CITRACAL+D) 315-200 MG-UNIT per tablet   Oral   Take 1 tablet by mouth daily.         . diazepam (VALIUM) 5 MG tablet   Oral   Take 5 mg by mouth every 6 (six) hours as needed for anxiety.         Marland Kitchen doxycycline (VIBRA-TABS) 100 MG tablet   Oral   Take 1 tablet (100 mg total) by mouth 2 (two) times daily.   20 tablet   0   . fluticasone (FLONASE) 50 MCG/ACT nasal spray   Each Nare   Place 2 sprays into both nostrils daily.         . lansoprazole (PREVACID) 30 MG capsule   Oral   Take 30 mg by mouth 2 (two) times daily.         Marland Kitchen levothyroxine (SYNTHROID, LEVOTHROID) 75 MCG tablet   Oral   Take 75 mcg by mouth daily before breakfast.         . levothyroxine (SYNTHROID, LEVOTHROID) 88 MCG tablet      take 1 tablet by mouth once daily   90 tablet   3   . loratadine (CLARITIN) 10 MG tablet   Oral   Take 10 mg by mouth daily.         Marland Kitchen LORazepam (ATIVAN) 0.5 MG tablet   Oral   Take 0.5 mg by mouth as needed for anxiety.         . metaxalone (SKELAXIN) 800 MG tablet   Oral   Take 1 tablet (800 mg total) by mouth 3 (three) times daily as needed for muscle spasms.   15 tablet   0   . Multiple Vitamins-Minerals (PRESERVISION AREDS PO)   Oral   Take 1 capsule by mouth daily.          . nitroGLYCERIN (NITROSTAT) 0.4 MG SL tablet   Sublingual   Place 1 tablet (0.4 mg total) under the tongue every 5 (five) minutes as needed.   25 tablet   6   . oxyCODONE-acetaminophen (PERCOCET) 10-325 MG per tablet   Oral   Take 1 tablet by mouth every 6 (six) hours as needed for pain.   20 tablet   0   .  polyethylene  glycol (MIRALAX / GLYCOLAX) packet   Oral   Take 17 g by mouth daily.         . sertraline (ZOLOFT) 100 MG tablet   Oral   Take 1.5 tablets (150 mg total) by mouth daily.   45 tablet   3   . tiotropium (SPIRIVA HANDIHALER) 18 MCG inhalation capsule   Inhalation   Place 1 capsule (18 mcg total) into inhaler and inhale daily.   90 capsule   0   . tolterodine (DETROL LA) 4 MG 24 hr capsule   Oral   Take 4 mg by mouth daily.         . vitamin C (ASCORBIC ACID) 250 MG tablet   Oral   Take 250 mg by mouth daily.           Allergies:  Sulfa antibiotics and Sulfonamide derivatives  Family History: Family History  Problem Relation Age of Onset  . Throat cancer Mother   . Stroke Father     Social History: Social History  Substance Use Topics  . Smoking status: Former Research scientist (life sciences)  . Smokeless tobacco: Never Used     Comment: quit 50 years ago  . Alcohol Use: No     Review of Systems:   10 point review of systems was performed and was otherwise negative:  Constitutional: No fever Eyes: No visual disturbances ENT: No sore throat, ear pain Cardiac: No chest pain Respiratory: No shortness of breath, wheezing, or stridor Abdomen: No abdominal pain, no vomiting, No diarrhea Endocrine: No weight loss, No night sweats Extremities: No peripheral edema, cyanosis Skin: No rashes, easy bruising Neurologic: No focal weakness, trouble with speech or swollowing Urologic: No dysuria, Hematuria, or urinary frequency   Physical Exam:  ED Triage Vitals  Enc Vitals Group     BP 08/05/15 0614 159/59 mmHg     Pulse Rate 08/05/15 0611 67     Resp 08/05/15 0611 22     Temp 08/05/15 0611 98.4 F (36.9 C)     Temp Source 08/05/15 0611 Oral     SpO2 08/05/15 0611 100 %     Weight 08/05/15 0611 145 lb (65.772 kg)     Height 08/05/15 0611 5\' 8"  (1.727 m)     Head Cir --      Peak Flow --      Pain Score 08/05/15 0613 10     Pain Loc --      Pain Edu? --       Excl. in [place]? --     General: Awake , Alert , and Oriented times 3; GCS 15 Head: Normal cephalic , atraumatic Eyes: Pupils equal , round, reactive to light Nose/Throat: No nasal drainage, patent upper airway without erythema or exudate.  Neck: Supple, Full range of motion, No anterior adenopathy or palpable thyroid masses Lungs: Clear to ascultation without wheezes , rhonchi, or rales Heart: Regular rate, regular rhythm without murmurs , gallops , or rubs Abdomen: Soft, non tender without rebound, guarding , or rigidity; bowel sounds positive and symmetric in all 4 quadrants. No organomegaly .        Extremities: Patient has erythema and warmth mainly over the lateral surface of her right wrist and also tenderness and erythema at the proximal and middle interphalangeal joints with joint swelling. Primarily the right hand but also present in the left upper extremity. No lower extremity joint pain or tenderness or swelling is noted. No fluctuance. Neurologic: normal ambulation, Motor symmetric without  deficits, sensory intact Skin: warm, dry, no rashes   Labs:   All laboratory work was reviewed including any pertinent negatives or positives listed below:  Labs Reviewed  CBC - Abnormal; Notable for the following:    Hemoglobin 10.8 (*)    HCT 32.2 (*)    RDW 16.9 (*)    All other components within normal limits  COMPREHENSIVE METABOLIC PANEL - Abnormal; Notable for the following:    Sodium 133 (*)    CO2 21 (*)    Glucose, Bld 128 (*)    BUN 32 (*)    Calcium 8.6 (*)    Albumin 3.2 (*)    ALT 13 (*)    Total Bilirubin 0.2 (*)    GFR calc non Af Amer 53 (*)    All other components within normal limits  URIC ACID  SEDIMENTATION RATE   stated weight is significantly elevated at 60     ED Course: * Patient has the appearance of the bilateral upper extremity joint pain and swelling which appears to be some form of possible arthritic syndrome likely rheumatoid like in its  presentation. Patient was started on IV pain control along with Solu-Medrol, Toradol, for inflammation. I felt x-rays were going to be noncontributory as is no history of trauma, etc. He'll also be difficult to perform any joint aspiration at this point given where the erythema and swelling has occurred. It does not appear to be a diffuse sepsis with the patient being afebrile and normal white blood cell count. Disposition will depend on pain control, etc.*    Assessment:  Inflammatory bilateral Polyarthritic syndrome      Plan:  Plan depends on pain control. Patient may require inpatient management and further workup or may be able to be sent back to [facility] facility.           Daymon Larsen, MD 08/05/15 [id number]

## 2015-08-05 NOTE — ED Notes (Signed)
Pt O2 sat was stable in lower 80's, so probe changed, which didn't help, so pt placed on 2L O2 via [place]. O2 Sat currently 96%.

## 2015-08-05 NOTE — H&P (Addendum)
[facility] [place] NAME: Chelsea [name]    MR#:  [id number]  DATE OF BIRTH:  24-Aug-1923  DATE OF ADMISSION:  08/05/2015  PRIMARY CARE PHYSICIAN: Margarita Rana, MD   REQUESTING/REFERRING PHYSICIAN: Daymon Larsen, MD  CHIEF COMPLAINT:   Chief Complaint  Patient presents with  . Hand Pain    bilateral    HISTORY OF PRESENT ILLNESS:  Chelsea [name]  is a 82 y.o. female with a known history of HTN, HLP, CAD and CVA. She presented with acute onset of bilateral hand joint pain which started last evening at 10:00 p.m. Finger joint pain is worse in her right hand than the left hand. Also, right wrist pain and redness. Hand pain is exacerbated by movement. She denies any fever or chills, no shoulder,hip, knee or foot pain. She was treated with solumedrol and toradol in ED, but still has pain.  PAST MEDICAL HISTORY:   Past Medical History  Diagnosis Date  . Coronary artery disease     a. s/p 2 vessel CABG 1989; b. echo 2003: nl; c. cath 2006: patent grafts, EF 65%; d. nuclear stress test 2007: no ischemia, EF 81%  . Hyperlipidemia   . Hypertension   . Bradycardia     hx of it and fatigue with beta blockade  . History of hyperkalemia   . Carotid artery disease ([place])     s/p carotid endarterectomy  . Stroke [facility]) 1980s    left brain  . Mitral valve prolapse   . Osteoporosis   . Asthma   . Degenerative disc disease   . Spinal stenosis in cervical region   . Norovirus 2014    PAST SURGICAL HISTORY:   Past Surgical History  Procedure Laterality Date  . Coronary artery bypass graft    . Carotid endarterectomy      left  . Cholecystectomy    . Abdominal hysterectomy    . Bladder suspension    . Hip surgery  02/2011    right    SOCIAL HISTORY:   Social History  Substance Use Topics  . Smoking status: Former Research scientist (life sciences)  . Smokeless tobacco: Never Used     Comment: quit 50 years ago  . Alcohol Use: No    FAMILY  HISTORY:   Family History  Problem Relation Age of Onset  . Throat cancer Mother   . Stroke Father     DRUG ALLERGIES:   Allergies  Allergen Reactions  . Sulfa Antibiotics     Other reaction(s): Dizziness  . Sulfonamide Derivatives Other (See Comments)    Reaction:  Dizziness     REVIEW OF SYSTEMS:  CONSTITUTIONAL: No fever, fatigue or weakness.  EYES: No blurred or double vision.  EARS, NOSE, AND THROAT: No tinnitus or ear pain.  RESPIRATORY: No cough, shortness of breath, wheezing or hemoptysis.  CARDIOVASCULAR: No chest pain, orthopnea, edema.  GASTROINTESTINAL: No nausea, vomiting, diarrhea or abdominal pain.  GENITOURINARY: No dysuria, hematuria.  ENDOCRINE: No polyuria, nocturia,  HEMATOLOGY: No anemia, easy bruising or bleeding SKIN: No rash or lesion. MUSCULOSKELETAL: has hand and wrist joint pain.Marland Kitchen   NEUROLOGIC: No tingling, numbness, weakness.  PSYCHIATRY: No anxiety or depression.   MEDICATIONS AT HOME:   Prior to Admission medications   Medication Sig Start Date End Date Taking? Authorizing Provider  albuterol (PROVENTIL HFA;VENTOLIN HFA) 108 (90 BASE) MCG/ACT inhaler Inhale 2 puffs into the lungs every 6 (six) hours as needed for wheezing or shortness  of breath.    Yes Historical Provider, MD  alendronate (FOSAMAX) 70 MG tablet Take 70 mg by mouth once a week. Take with a full glass of water on an empty stomach.   Yes Historical Provider, MD  amLODipine (NORVASC) 10 MG tablet Take 1 tablet (10 mg total) by mouth daily. 04/30/15  Yes Mar Daring, PA-C  aspirin EC 81 MG tablet Take 81 mg by mouth daily.   Yes Historical Provider, MD  atorvastatin (LIPITOR) 80 MG tablet Take 1 tablet (80 mg total) by mouth daily. 11/03/14  Yes Minna Merritts, MD  calcium citrate-vitamin D (CITRACAL+D) 315-200 MG-UNIT per tablet Take 1 tablet by mouth daily.   Yes Historical Provider, MD  fluticasone (FLONASE) 50 MCG/ACT nasal spray Place 2 sprays into both nostrils daily.    Yes Historical Provider, MD  fluticasone furoate-vilanterol (BREO ELLIPTA) 100-25 MCG/INH AEPB Inhale 1 puff into the lungs daily.   Yes Historical Provider, MD  furosemide (LASIX) 20 MG tablet Take 20 mg by mouth as needed.   Yes Historical Provider, MD  levothyroxine (SYNTHROID, LEVOTHROID) 88 MCG tablet take 1 tablet by mouth once daily 04/07/15  Yes Margarita Rana, MD  metaxalone (SKELAXIN) 800 MG tablet Take 1 tablet (800 mg total) by mouth 3 (three) times daily as needed for muscle spasms. 02/18/15  Yes Srikar Sudini, MD  Multiple Vitamins-Minerals (PRESERVISION AREDS PO) Take 1 capsule by mouth daily.    Yes Historical Provider, MD  nitroGLYCERIN (NITROSTAT) 0.4 MG SL tablet Place 1 tablet (0.4 mg total) under the tongue every 5 (five) minutes as needed. 08/27/13  Yes Minna Merritts, MD  omeprazole (PRILOSEC) 20 MG capsule Take 20 mg by mouth daily.   Yes Historical Provider, MD  oxybutynin (DITROPAN) 5 MG tablet Take 5 mg by mouth at bedtime.   Yes Historical Provider, MD  oxyCODONE-acetaminophen (PERCOCET) 10-325 MG per tablet Take 1 tablet by mouth every 6 (six) hours as needed for pain. 02/18/15  Yes Srikar Sudini, MD  potassium chloride (K-DUR) 10 MEQ tablet Take 10 mEq by mouth as needed.   Yes Historical Provider, MD  senna (SENOKOT) 8.6 MG TABS tablet Take 1 tablet by mouth as needed for mild constipation.   Yes Historical Provider, MD  sertraline (ZOLOFT) 100 MG tablet Take 1.5 tablets (150 mg total) by mouth daily. 01/15/15  Yes Margarita Rana, MD  tiotropium (SPIRIVA HANDIHALER) 18 MCG inhalation capsule Place 1 capsule (18 mcg total) into inhaler and inhale daily. 03/19/15  Yes Margarita Rana, MD  doxycycline (VIBRA-TABS) 100 MG tablet Take 1 tablet (100 mg total) by mouth 2 (two) times daily. Patient not taking: Reported on 08/05/2015 07/02/15   Mar Daring, PA-C      VITAL SIGNS:  Blood pressure 149/48, pulse 78, temperature 98.4 F (36.9 C), temperature source Oral, resp.  rate 22, height 5' 8"  (1.727 m), weight 65.772 kg (145 lb), SpO2 97 %.  PHYSICAL EXAMINATION:  GENERAL:  82 y.o.-year-old patient lying in the bed with no acute distress.  EYES: Pupils equal, round, reactive to light and accommodation. No scleral icterus. Extraocular muscles intact.  HEENT: Head atraumatic, normocephalic. Oropharynx and nasopharynx clear.  NECK:  Supple, no jugular venous distention. No thyroid enlargement, no tenderness.  LUNGS: Normal breath sounds bilaterally, no wheezing, rales,rhonchi or crepitation. No use of accessory muscles of respiration.  CARDIOVASCULAR: S1, S2 normal. No murmurs, rubs, or gallops.  ABDOMEN: Soft, nontender, nondistended. Bowel sounds present. No organomegaly or mass.  EXTREMITIES: No  pedal edema, cyanosis, or clubbing. Erythema and warmth mainly over the lateral surface of her right wrist and also tenderness and erythema at the proximal and middle interphalangeal joints with joint swelling of both hands. NEUROLOGIC: Cranial nerves II through XII are intact. Muscle strength 5/5 in all extremities. Sensation intact. Gait not checked.  PSYCHIATRIC: The patient is alert and oriented x 3.  SKIN: No obvious rash, lesion, or ulcer.   LABORATORY PANEL:   CBC  Recent Labs Lab 08/05/15 0644  WBC 9.7  HGB 10.8*  HCT 32.2*  PLT 290   ------------------------------------------------------------------------------------------------------------------  Chemistries   Recent Labs Lab 08/05/15 0644  NA 133*  K 4.7  CL 105  CO2 21*  GLUCOSE 128*  BUN 32*  CREATININE 0.93  CALCIUM 8.6*  AST 16  ALT 13*  ALKPHOS 85  BILITOT 0.2*   ------------------------------------------------------------------------------------------------------------------  Cardiac Enzymes No results for input(s): TROPONINI in the last 168 hours. ------------------------------------------------------------------------------------------------------------------  RADIOLOGY:   No results found.  EKG:   Orders placed or performed during the hospital encounter of 06/14/15  . ED EKG  . ED EKG  . EKG    IMPRESSION AND PLAN:   Polyarthritis. Observation. Pain control, ESR, rheumatology consult.  Hypotension. Hold HTN meds, give IV NS bolus.  CAD. Continue ASA and lipitor.   All the records are reviewed and case discussed with ED provider. Management plans discussed with the patient, family and they are in agreement.  CODE STATUS: full code. TOTAL TIME TAKING CARE OF THIS PATIENT: 52 minutes.    Demetrios Loll M.D on 08/05/2015 at 9:54 AM  Between 7am to 6pm - Pager - [phone]  After 6pm go to [email or web address] - password [email or web address] [facility]  [facility]  Office  [phone]  CC: Primary care physician; Margarita Rana, MD

## 2015-08-05 NOTE — ED Notes (Signed)
Report from [place], [place]

## 2015-08-05 NOTE — ED Notes (Signed)
Dr. Quigley at bedside.  

## 2015-08-05 NOTE — Progress Notes (Signed)
Clinical Education officer, museum (CSW) confirmed with Cabin crew at [facility] that patient lives in the [place] at [facility] Patient is currently under Medicare Observation. CSW will continue to follow and assist as needed.   Blima Rich, LCSW [phone]

## 2015-08-05 NOTE — Consult Note (Signed)
Reason for Consult: Joint pain  Referring Physician: Dr. Bridgett Larsson hospitalist  [name]   HPI: 82 year old white female. History of stroke. Prior hip replacement. Known lumbar disc disease with cervical stenosis and lumbar stenosis. Prior epidurals. Had sinus infection 2 months ago. 2-3 antibiotics including Levaquin. Then had fall. Scraped left leg. Took doxycycline 2 weeks ago had pain and tenderness the right fifth PIP. Last night had sudden onset of pain and swelling in the right lateral wrist. Had some tenderness in the left wrist. Unsure if it swelled much. Came to emergency room. Has received 1 dose of IV Solu-Medrol. Greater than 50% better. Sedimentation rate 60.  No history of rheumatoid arthritis. No history of gout. Osteoporosis. Previously was on alendronate  PMH: Coronary disease. Osteoarthritis. Prior positive PPD  SURGICAL HISTORY: Bypass surgery. Carotid endarterectomy.  Family History: Negative for rheumatoid arthritis  Social History: No cigarettes or alcohol  Allergies:  Allergies  Allergen Reactions  . Sulfa Antibiotics     Other reaction(s): Dizziness  . Sulfonamide Derivatives Other (See Comments)    Reaction:  Dizziness     Medications:  Scheduled: . aspirin EC  81 mg Oral Daily  . atorvastatin  80 mg Oral Daily  . calcium citrate-vitamin D  1 tablet Oral Daily  . enoxaparin (LOVENOX) injection  40 mg Subcutaneous Q24H  . fluticasone  2 spray Each Nare Daily  . levothyroxine  88 mcg Oral Daily  . mometasone-formoterol  2 puff Inhalation BID  . oxybutynin  5 mg Oral QHS  . pantoprazole  40 mg Oral Daily  . sertraline  150 mg Oral Daily  . tiotropium  18 mcg Inhalation Daily        ROS: No chest pain. No abdominal pain no inflammatory eye disease. No fever. Weights been stable.   PHYSICAL EXAM: Blood pressure 109/41, pulse 69, temperature 97.8 F (36.6 C), temperature source Oral, resp. rate 17, height 5\' 8"  (1.727 m), weight 65.772 kg  (145 lb), SpO2 95 %. Pleasant female. Seen in bed. No acute distress. Sclera clear. Bilateral crackles. No significant murmur. No visceromegaly. Trace edema. Mild is regular cervical spine. Shoulders moved well. Right lateral wrist has erythema and tenderness to flex and extend. Right fifth MCP mildly tender PIP mildly tender. No other synovitis in the right hand. Left wrist has mild pain with flexion and extension. Hips move well. No knee effusions. MTPs nontender.  Assessment: Acute tenosynovitis. Could be crystalline. Could be  sudden onset of rheumatoid arthritis. Improved after steroid dose Osteoarthritis with lumbar disc disease and status post hip replacement Recent antibiotics including Levaquin without obvious tendon tear Coronary disease. Carotid artery disease Osteoporosis. Prior compression fracture  Recommendations: Wrist splint. Rheumatoid factor uric acid. Prednisone taper. Happy to follow-up as outpatient  Emmaline Kluver 08/05/2015, 4:56 PM

## 2015-08-05 NOTE — ED Notes (Signed)
Pt given phone to call family.

## 2015-08-06 ENCOUNTER — Observation Stay: Payer: Medicare Other

## 2015-08-06 ENCOUNTER — Telehealth: Payer: Self-pay | Admitting: Family Medicine

## 2015-08-06 DIAGNOSIS — M130 Polyarthritis, unspecified: Secondary | ICD-10-CM | POA: Diagnosis not present

## 2015-08-06 DIAGNOSIS — M659 Synovitis and tenosynovitis, unspecified: Secondary | ICD-10-CM | POA: Diagnosis not present

## 2015-08-06 DIAGNOSIS — I10 Essential (primary) hypertension: Secondary | ICD-10-CM | POA: Diagnosis not present

## 2015-08-06 DIAGNOSIS — I959 Hypotension, unspecified: Secondary | ICD-10-CM | POA: Diagnosis not present

## 2015-08-06 DIAGNOSIS — R918 Other nonspecific abnormal finding of lung field: Secondary | ICD-10-CM | POA: Diagnosis not present

## 2015-08-06 LAB — BASIC METABOLIC PANEL
ANION GAP: 8 (ref 5–15)
BUN: 43 mg/dL — ABNORMAL HIGH (ref 6–20)
CALCIUM: 8.2 mg/dL — AB (ref 8.9–10.3)
CO2: 22 mmol/L (ref 22–32)
CREATININE: 1.21 mg/dL — AB (ref 0.44–1.00)
Chloride: 105 mmol/L (ref 101–111)
GFR, EST AFRICAN AMERICAN: 45 mL/min — AB (ref >60)
GFR, EST NON AFRICAN AMERICAN: 38 mL/min — AB (ref >60)
GLUCOSE: 121 mg/dL — AB (ref 65–99)
Potassium: 5.1 mmol/L (ref 3.5–5.1)
Sodium: 135 mmol/L (ref 135–145)

## 2015-08-06 LAB — URIC ACID: URIC ACID, SERUM: 6.8 mg/dL — AB (ref 2.3–6.6)

## 2015-08-06 LAB — CBC
HCT: 29.5 % — ABNORMAL LOW (ref 35.0–47.0)
HEMOGLOBIN: 9.6 g/dL — AB (ref 12.0–16.0)
MCH: 27.7 pg (ref 26.0–34.0)
MCHC: 32.5 g/dL (ref 32.0–36.0)
MCV: 85.3 fL (ref 80.0–100.0)
PLATELETS: 283 10*3/uL (ref 150–440)
RBC: 3.45 MIL/uL — ABNORMAL LOW (ref 3.80–5.20)
RDW: 16.7 % — ABNORMAL HIGH (ref 11.5–14.5)
WBC: 11.7 10*3/uL — ABNORMAL HIGH (ref 3.6–11.0)

## 2015-08-06 MED ORDER — BISACODYL 5 MG PO TBEC
10.0000 mg | DELAYED_RELEASE_TABLET | Freq: Once | ORAL | Status: AC
  Administered 2015-08-06: 10 mg via ORAL
  Filled 2015-08-06: qty 2

## 2015-08-06 MED ORDER — PREDNISONE 10 MG PO TABS: ORAL_TABLET | ORAL | Status: DC

## 2015-08-06 MED ORDER — LACTULOSE 10 GM/15ML PO SOLN: 20.0000 g | Freq: Every day | ORAL | Status: DC | PRN

## 2015-08-06 MED ORDER — OXYCODONE-ACETAMINOPHEN 10-325 MG PO TABS: 1.0000 | ORAL_TABLET | Freq: Four times a day (QID) | ORAL | Status: DC | PRN

## 2015-08-06 NOTE — Evaluation (Signed)
Physical Therapy Evaluation Patient Details Name: Chelsea [name] MRN: [id number] DOB: [birth year] Today's Date: 08/06/2015   History of Present Illness  presented to ER with acute onset of bilat hand/wrist pain (R > L); admitted for management of polyarthritis.  Clinical Impression  Upon evaluation, patient alert and oriented; follows commands and demonstrates good insight/safety awareness.  Bilat UE/LE grossly WFL and symmetrical with exception of R wrist (limited end-range flex/ext), limited due to pain (but functional for ADLs and moility).  Able to complete bed mobility with mod indep; sit/stand, basic transfers and gait (400') with RW, sup/mod indep.  Slow, but steady, cadence and gait speed (10' walk time, 10-11 seconds) without buckling, LOB or safety concern.   Educated in donning/doffing and use of R wrist splint provided by Dr. Jefm Bryant; patient voiced understanding. Patient presents at baseline level of functional ability; no acute PT needs indicated at this time.  Will discontinue PT order; please re-consult should needs change.    Follow Up Recommendations No PT follow up    Equipment Recommendations       Recommendations for Other Services       Precautions / Restrictions Precautions Precautions: Fall Restrictions Weight Bearing Restrictions: No      Mobility  Bed Mobility Overal bed mobility: Modified Independent                Transfers Overall transfer level: Needs assistance Equipment used: Rolling walker (2 wheeled) Transfers: Sit to/from Stand Sit to Stand: Modified independent (Device/Increase time)            Ambulation/Gait Ambulation/Gait assistance: Modified independent (Device/Increase time);Supervision Ambulation Distance (Feet): 400 Feet Assistive device: Rolling walker (2 wheeled)   Gait velocity: 10' walk time, 10-11 seconds   General Gait Details: reciprocal stepping pattern with forward flexed posture; slow, but steady,  cadence and gait speed without buckling, LOB or safety concern.  Stairs            Wheelchair Mobility    Modified Rankin (Stroke Patients Only)       Balance Overall balance assessment: Needs assistance Sitting-balance support: No upper extremity supported;Feet supported Sitting balance-Leahy Scale: Good     Standing balance support: Bilateral upper extremity supported Standing balance-Leahy Scale: Good                               Pertinent Vitals/Pain Pain Assessment: No/denies pain    Home Living Family/patient expects to be discharged to:: Private residence Living Arrangements: Alone   Type of Home: House Home Access: Level entry     Home Layout: One level Home Equipment: Environmental consultant - 2 wheels;Walker - 4 wheels;Shower seat Additional Comments: Resident of Independent living at [facility]    Prior Function Level of Independence: Independent with assistive device(s)         Comments: Indep with ADLs, household and commuity mobility; + driving.  Utilizes [id number] within home and 2WRW within community.  Does endorse single fall in previous six months.     Hand Dominance        Extremity/Trunk Assessment   Upper Extremity Assessment: Overall WFL for tasks assessed (R wrist grossly limited by pain at end range flex/ext, but functional for ADLs and mobility)           Lower Extremity Assessment: Overall WFL for tasks assessed         Communication   Communication: No difficulties  Cognition Arousal/Alertness: Awake/alert Behavior During  Therapy: WFL for tasks assessed/performed Overall Cognitive Status: Within Functional Limits for tasks assessed                      General Comments      Exercises Other Exercises Other Exercises: Educated in donning/doffing of R wrist splint (provided by Dr. Jefm Bryant); patient voiced understanding.      Assessment/Plan    PT Assessment Patent does not need any further PT services  PT  Diagnosis     PT Problem List    PT Treatment Interventions     PT Goals (Current goals can be found in the Care Plan section) Acute Rehab PT Goals Patient Stated Goal: to return to [facility] today PT Goal Formulation: All assessment and education complete, DC therapy    Frequency     Barriers to discharge        Co-evaluation               End of Session Equipment Utilized During Treatment: Gait belt Activity Tolerance: Patient tolerated treatment well Patient left: with call bell/phone within reach;in chair;with chair alarm set (patient aware of need for assist with mobility; agreeable to call nursing for assist as needed) Nurse Communication: Mobility status    Functional Assessment Tool Used: clincal judgement, 10' walk time Functional Limitation: Mobility: Walking and moving around Mobility: Walking and Moving Around Current Status [id number]): At least 1 percent but less than 20 percent impaired, limited or restricted Mobility: Walking and Moving Around Goal Status [phone]): At least 1 percent but less than 20 percent impaired, limited or restricted Mobility: Walking and Moving Around Discharge Status [phone]): At least 1 percent but less than 20 percent impaired, limited or restricted    Time: 0945-1008 PT Time Calculation (min) (ACUTE ONLY): 23 min   Charges:   PT Evaluation $PT Eval Low Complexity: 1 Procedure PT Treatments $Therapeutic Activity: 8-22 mins   PT G Codes:   PT G-Codes **NOT FOR INPATIENT CLASS** Functional Assessment Tool Used: clincal judgement, 10' walk time Functional Limitation: Mobility: Walking and moving around Mobility: Walking and Moving Around Current Status [id number]): At least 1 percent but less than 20 percent impaired, limited or restricted Mobility: Walking and Moving Around Goal Status [phone]): At least 1 percent but less than 20 percent impaired, limited or restricted Mobility: Walking and Moving Around Discharge Status [phone]): At  least 1 percent but less than 20 percent impaired, limited or restricted    Abena Erdman H. Owens Shark, PT, DPT, NCS 08/06/2015, 10:51 AM [phone]

## 2015-08-06 NOTE — Progress Notes (Signed)
PT is recommending no PT follow up. Patient will D/C home to [place] at [facility] today. Please reconsult if future social work needs arise. CSW signing off.   Blima Rich, LCSW [phone]

## 2015-08-06 NOTE — Care Management (Signed)
Patient to return to independent living at [facility]. PT is not recommending HHPT or DME. Case closed.

## 2015-08-06 NOTE — Progress Notes (Signed)
Paged Dr. Bridgett Larsson to review results from chest xray prior to discharging patient.

## 2015-08-06 NOTE — Telephone Encounter (Signed)
Ok to make [facility] call . Thanks.

## 2015-08-06 NOTE — Progress Notes (Signed)
Completed discharge instructions with patient.  Discharged home.  Equipment already at home.  No questions or complaints at time of discharge.

## 2015-08-06 NOTE — Discharge Instructions (Signed)
Heart healthy diet. °Activity as tolerated. °

## 2015-08-06 NOTE — Progress Notes (Signed)
Patient lung sounds Coarse Crackles.  No chest xray on this admission.  Paged Dr. Bridgett Larsson to request.

## 2015-08-06 NOTE — Discharge Summary (Signed)
[name]    MR#:  [id number]  DATE OF BIRTH:  04-09-1924  DATE OF ADMISSION:  08/05/2015 ADMITTING PHYSICIAN: Demetrios Loll, MD  DATE OF DISCHARGE: 08/06/2015  2:39 PM  PRIMARY CARE PHYSICIAN: Margarita Rana, MD    ADMISSION DIAGNOSIS:  Hand pain   DISCHARGE DIAGNOSIS:  Acute tenosynovitis  SECONDARY DIAGNOSIS:   Past Medical History  Diagnosis Date  . Coronary artery disease     a. s/p 2 vessel CABG 1989; b. echo 2003: nl; c. cath 2006: patent grafts, EF 65%; d. nuclear stress test 2007: no ischemia, EF 81%  . Hyperlipidemia   . Hypertension   . Bradycardia     hx of it and fatigue with beta blockade  . History of hyperkalemia   . Carotid artery disease ([place])     s/p carotid endarterectomy  . Stroke Chelsea [name]) 1980s    left brain  . Mitral valve prolapse   . Osteoporosis   . Asthma   . Degenerative disc disease   . Spinal stenosis in cervical region   . Norovirus 2014    HOSPITAL COURSE:   Acute tenosynovitis. Could be crystalline. Could be sudden onset of rheumatoid arthritis per Dr. Jefm Bryant.  Improved after steroid dose. Taper prednisone, pain control.  Hypotension. Improved with IV NS bolus.  CAD. Continue ASA and lipitor.   DISCHARGE CONDITIONS:  Stable, discharged to home.  CONSULTS OBTAINED:  Treatment Team:  Emmaline Kluver., MD  DRUG ALLERGIES:   Allergies  Allergen Reactions  . Sulfa Antibiotics     Other reaction(s): Dizziness  . Sulfonamide Derivatives Other (See Comments)    Reaction:  Dizziness     DISCHARGE MEDICATIONS:   Discharge Medication List as of 08/06/2015  2:03 PM    START taking these medications   Details  predniSONE (DELTASONE) 10 MG tablet 40 mg po daily, 30 mg po daily, 20 mg po daily, 10 mg po daily,, Print      CONTINUE these medications which have CHANGED   Details  oxyCODONE-acetaminophen (PERCOCET) 10-325 MG tablet Take 1  tablet by mouth every 6 (six) hours as needed for pain., Starting 08/06/2015, Until Discontinued, Print      CONTINUE these medications which have NOT CHANGED   Details  albuterol (PROVENTIL HFA;VENTOLIN HFA) 108 (90 BASE) MCG/ACT inhaler Inhale 2 puffs into the lungs every 6 (six) hours as needed for wheezing or shortness of breath. , Until Discontinued, Historical Med    alendronate (FOSAMAX) 70 MG tablet Take 70 mg by mouth once a week. Take with a full glass of water on an empty stomach., Until Discontinued, Historical Med    amLODipine (NORVASC) 10 MG tablet Take 1 tablet (10 mg total) by mouth daily., Starting 04/30/2015, Until Discontinued, Normal    aspirin EC 81 MG tablet Take 81 mg by mouth daily., Until Discontinued, Historical Med    atorvastatin (LIPITOR) 80 MG tablet Take 1 tablet (80 mg total) by mouth daily., Starting 11/03/2014, Until Discontinued, Normal    calcium citrate-vitamin D (CITRACAL+D) 315-200 MG-UNIT per tablet Take 1 tablet by mouth daily., Until Discontinued, Historical Med    fluticasone (FLONASE) 50 MCG/ACT nasal spray Place 2 sprays into both nostrils daily., Until Discontinued, Historical Med    fluticasone furoate-vilanterol (BREO ELLIPTA) 100-25 MCG/INH AEPB Inhale 1 puff into the lungs daily., Until Discontinued, Historical Med    furosemide (LASIX) 20 MG tablet Take 20 mg by  mouth as needed., Until Discontinued, Historical Med    levothyroxine (SYNTHROID, LEVOTHROID) 88 MCG tablet take 1 tablet by mouth once daily, Normal    metaxalone (SKELAXIN) 800 MG tablet Take 1 tablet (800 mg total) by mouth 3 (three) times daily as needed for muscle spasms., Starting 02/18/2015, Until Discontinued, Print    Multiple Vitamins-Minerals (PRESERVISION AREDS PO) Take 1 capsule by mouth daily. , Until Discontinued, Historical Med    nitroGLYCERIN (NITROSTAT) 0.4 MG SL tablet Place 1 tablet (0.4 mg total) under the tongue every 5 (five) minutes as needed., Starting  08/27/2013, Until Discontinued, Normal    omeprazole (PRILOSEC) 20 MG capsule Take 20 mg by mouth daily., Until Discontinued, Historical Med    oxybutynin (DITROPAN) 5 MG tablet Take 5 mg by mouth at bedtime., Until Discontinued, Historical Med    potassium chloride (K-DUR) 10 MEQ tablet Take 10 mEq by mouth as needed., Until Discontinued, Historical Med    senna (SENOKOT) 8.6 MG TABS tablet Take 1 tablet by mouth as needed for mild constipation., Until Discontinued, Historical Med    sertraline (ZOLOFT) 100 MG tablet Take 1.5 tablets (150 mg total) by mouth daily., Starting 01/15/2015, Until Discontinued, Normal    tiotropium (SPIRIVA HANDIHALER) 18 MCG inhalation capsule Place 1 capsule (18 mcg total) into inhaler and inhale daily., Starting 03/19/2015, Until Discontinued, No Print      STOP taking these medications     doxycycline (VIBRA-TABS) 100 MG tablet          DISCHARGE INSTRUCTIONS:    If you experience worsening of your admission symptoms, develop shortness of breath, life threatening emergency, suicidal or homicidal thoughts you must seek medical attention immediately by calling 911 or calling your MD immediately  if symptoms less severe.  You Must read complete instructions/literature along with all the possible adverse reactions/side effects for all the Medicines you take and that have been prescribed to you. Take any new Medicines after you have completely understood and accept all the possible adverse reactions/side effects.   Please note  You were cared for by a hospitalist during your hospital stay. If you have any questions about your discharge medications or the care you received while you were in the hospital after you are discharged, you can call the unit and asked to speak with the hospitalist on call if the hospitalist that took care of you is not available. Once you are discharged, your primary care physician will handle any further medical issues. Please note  that NO REFILLS for any discharge medications will be authorized once you are discharged, as it is imperative that you return to your primary care physician (or establish a relationship with a primary care physician if you do not have one) for your aftercare needs so that they can reassess your need for medications and monitor your lab values.    Today   SUBJECTIVE   Better hand joint pain.   VITAL SIGNS:  Blood pressure 130/47, pulse 50, temperature 97.3 F (36.3 C), temperature source Oral, resp. rate 18, height 5\' 8"  (1.727 m), weight 65.772 kg (145 lb), SpO2 94 %.  I/O:   Intake/Output Summary (Last 24 hours) at 08/06/15 1601 Last data filed at 08/06/15 1300  Gross per 24 hour  Intake    780 ml  Output    850 ml  Net    -70 ml    PHYSICAL EXAMINATION:  GENERAL:  82 y.o.-year-old patient lying in the bed with no acute distress.  EYES: Pupils  equal, round, reactive to light and accommodation. No scleral icterus. Extraocular muscles intact.  HEENT: Head atraumatic, normocephalic. Oropharynx and nasopharynx clear.  NECK:  Supple, no jugular venous distention. No thyroid enlargement, no tenderness.  LUNGS: Normal breath sounds bilaterally, no wheezing, rales,rhonchi or crepitation. No use of accessory muscles of respiration.  CARDIOVASCULAR: S1, S2 normal. No murmurs, rubs, or gallops.  ABDOMEN: Soft, non-tender, non-distended. Bowel sounds present. No organomegaly or mass.  EXTREMITIES: No pedal edema, cyanosis, or clubbing.  NEUROLOGIC: Cranial nerves II through XII are intact. Muscle strength 5/5 in all extremities. Sensation intact. Gait not checked.  PSYCHIATRIC: The patient is alert and oriented x 3.  SKIN: No obvious rash, lesion, or ulcer.   DATA REVIEW:   CBC  Recent Labs Lab 08/06/15 0444  WBC 11.7*  HGB 9.6*  HCT 29.5*  PLT 283    Chemistries   Recent Labs Lab 08/05/15 0644 08/06/15 0444  NA 133* 135  K 4.7 5.1  CL 105 105  CO2 21* 22  GLUCOSE  128* 121*  BUN 32* 43*  CREATININE 0.93 1.21*  CALCIUM 8.6* 8.2*  AST 16  --   ALT 13*  --   ALKPHOS 85  --   BILITOT 0.2*  --     Cardiac Enzymes No results for input(s): TROPONINI in the last 168 hours.  Microbiology Results  Results for orders placed or performed during the hospital encounter of 08/05/15  MRSA PCR Screening     Status: None   Collection Time: 08/05/15  1:40 PM  Result Value Ref Range Status   MRSA by PCR NEGATIVE NEGATIVE Final    Comment:        The GeneXpert MRSA Assay (FDA approved for NASAL specimens only), is one component of a comprehensive MRSA colonization surveillance program. It is not intended to diagnose MRSA infection nor to guide or monitor treatment for MRSA infections.     RADIOLOGY:  Dg Chest 2 View  08/06/2015  CLINICAL DATA:  Lung crackles EXAM: CHEST  2 VIEW COMPARISON:  05/17/15 FINDINGS: Cardiomediastinal silhouette is stable. There is large hiatal hernia with air-fluid level. No definite superimposed infiltrate. There is left basilar atelectasis. No pulmonary edema. Old right rib fractures again noted. Status post median sternotomy. Thoracic spine osteopenia. Poorly visualized compression deformities lumbar spine. IMPRESSION: No infiltrate or pulmonary edema. Left basilar atelectasis. Large hiatal hernia with air-fluid level. Status post median sternotomy. Thoracic spine osteopenia. Poorly visualized compression deformities lumbar spine Electronically Signed   By: Lahoma Crocker M.D.   On: 08/06/2015 13:45   Dg Hand 2 View Right  08/05/2015  CLINICAL DATA:  Bilateral hands joint pain started last evening at 10 o'clock p.m. Primarily right hand symptoms. Proximal and middle joint. Discomfort with movement or touch. EXAM: RIGHT HAND - 2 VIEW COMPARISON:  None. FINDINGS: Bones appear radiolucent. There is mild degenerative change involving the distal interphalangeal joint. No significant erosions. No acute fracture. IMPRESSION: Two 1. Mild  degenerative changes. 2.  No evidence for acute  abnormality. Electronically Signed   By: Nolon Nations M.D.   On: 08/05/2015 15:12   Dg Hand 2 View Left  08/05/2015  CLINICAL DATA:  Acute onset bilateral hand pain starting last night at 10 p.m. EXAM: LEFT HAND - 2 VIEW COMPARISON:  None. FINDINGS: A ring is present on the ring finger and reportedly cannot be rib removed due to swelling. There does appear to be some soft tissue swelling along the proximal interphalangeal joints. Degenerative  loss of articular space in the interphalangeal joints favors osteoarthritis. No erosions are seen. I do not see a fracture cord acute finding. There is bony demineralization. IMPRESSION: 1. No acute bony findings, although there is a suggestion of mild soft tissue swelling along the proximal interphalangeal joints. 2. Degenerative loss of articular space in the interphalangeal joints compatible with osteoarthritis. No erosions are identified. Electronically Signed   By: Van Clines M.D.   On: 08/05/2015 15:11        Management plans discussed with the patient, Dr. Jefm Bryant, and they are in agreement.  CODE STATUS:     Code Status Orders        Start     Ordered   08/05/15 1129  Full code   Continuous     08/05/15 1128    Code Status History    Date Active Date Inactive Code Status Order ID Comments User Context   02/10/2015  9:45 PM 02/18/2015  5:51 PM DNR [id number]  Gladstone Lighter, MD Inpatient      TOTAL TIME TAKING CARE OF THIS PATIENT:35 minutes.    Demetrios Loll M.D on 08/06/2015 at 4:01 PM  Between 7am to 6pm - Pager - [phone]  After 6pm go to [email or web address] - password [email or web address] [facility]  [facility]  Office  [phone]  CC: Primary care physician; Margarita Rana, MD

## 2015-08-06 NOTE — Telephone Encounter (Signed)
FYI  Pt is scheduled for hospital f/u on 08/13/15. Pt is being discharged today 08/06/15 and was treated for hand pain. Thanks TNP

## 2015-08-07 LAB — RHEUMATOID FACTOR: Rhuematoid fact SerPl-aCnc: 11.2 IU/mL (ref 0.0–13.9)

## 2015-08-10 ENCOUNTER — Telehealth: Payer: Self-pay | Admitting: Family Medicine

## 2015-08-10 ENCOUNTER — Ambulatory Visit (INDEPENDENT_AMBULATORY_CARE_PROVIDER_SITE_OTHER): Payer: Medicare Other | Admitting: Cardiovascular Disease

## 2015-08-10 ENCOUNTER — Encounter: Payer: Self-pay | Admitting: Cardiovascular Disease

## 2015-08-10 VITALS — BP 140/70 | HR 58 | Ht 65.0 in | Wt 138.8 lb

## 2015-08-10 DIAGNOSIS — I25719 Atherosclerosis of autologous vein coronary artery bypass graft(s) with unspecified angina pectoris: Secondary | ICD-10-CM

## 2015-08-10 DIAGNOSIS — J8410 Pulmonary fibrosis, unspecified: Secondary | ICD-10-CM

## 2015-08-10 DIAGNOSIS — E785 Hyperlipidemia, unspecified: Secondary | ICD-10-CM | POA: Diagnosis not present

## 2015-08-10 DIAGNOSIS — I6523 Occlusion and stenosis of bilateral carotid arteries: Secondary | ICD-10-CM

## 2015-08-10 DIAGNOSIS — I10 Essential (primary) hypertension: Secondary | ICD-10-CM

## 2015-08-10 MED ORDER — NITROGLYCERIN 0.4 MG SL SUBL: 0.4000 mg | SUBLINGUAL_TABLET | SUBLINGUAL | Status: DC | PRN

## 2015-08-10 NOTE — Telephone Encounter (Signed)
Pt reports that she was diagnosed with gouty arthritis. Reports she is feeling better. Advised pt to bring all medications with her to [facility] on Friday. Renaldo Fiddler, CMA

## 2015-08-10 NOTE — Assessment & Plan Note (Signed)
Stable 40-59% bilateral carotid disease from April 2016 Repeat in one year

## 2015-08-10 NOTE — Assessment & Plan Note (Signed)
Blood pressure is well controlled on today's visit. No changes made to the medications. 

## 2015-08-10 NOTE — Assessment & Plan Note (Signed)
Clinical exam consistent with mild pulmonary fibrosis, worse at the left base, she reports stable symptoms

## 2015-08-10 NOTE — Telephone Encounter (Signed)
LMTCB 08/10/2015  Thanks,   -Mickel Baas

## 2015-08-10 NOTE — Assessment & Plan Note (Addendum)
Recent episode of chest pain, etiology unclear We have sent in a prescription for nitroglycerin for her to take as needed for additional episodes of chest pain. She is unclear if she wants to take this. Recommended that she call our office if she has additional episodes of chest discomfort   Total encounter time more than 25 minutes  Greater than 50% was spent in counseling and coordination of care with the patient

## 2015-08-10 NOTE — Addendum Note (Signed)
Addended by: Minna Merritts on: 08/10/2015 01:22 PM   Modules accepted: Level of Service

## 2015-08-10 NOTE — Assessment & Plan Note (Signed)
Cholesterol is at goal on the current lipid regimen. No changes to the medications were made.  

## 2015-08-10 NOTE — Progress Notes (Signed)
Patient ID: DEZMARIAH BELO, female    DOB: September 26, 1923, 82 y.o.   MRN: [id number]  HPI Comments: 82 year-old woman with CAD s/p CABG, diastolic dysfunction, carotid endarterectomy on the left 10 years ago, now with 40-59% carotid disease , mildly dilated left atrium, mild MR, presenting today for follow-up evaluation of her shortness of breath, carotid arterial disease,  40 to 59% b/l carotid disease   On her last clinic visits she had worsening shortness of breath, we gave Lasix to her to use only on an as-needed basis for diastolic CHF. In follow-up today, she reports that she has not been using Lasix  Several visits to the emergency room, had a fall 06/14/2015 went to the emergency room Had joint pain February 16 in her arm went to the emergency room was kept overnight August 2016 had severe pneumonia, long hospital stay  In follow-up today, she reports that her shortness of breath is mild, stable Walks with a walker  She did have one episode of chest pain 07/21/2015 symptoms lasting for 10 minutes Checked her blood pressure, heart rate everything was normal, felt a heaviness in her throat Took aspirin and symptoms seem to resolve on their own  Other past medical history reviewed ECHO 02/11/15 normal study PNA in 01/2015, also with pleural effusion on CXR  Chronic severe back pain from spinal stenosis. Previous problems with constipation, in the hospital January 2016, CT scan confirming constipation  dizziness in the past, h/o fall with right hip fracture, right arm fracture, 3 ribs injured. She had a long recovery in [place] where the injury occurred. She is now back at [facility] [place]. Previous upper respiratory infection. 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 She has had significant pain, cortisone shots x3 to her hip now wearing a TENS unit for pain. She reports having moderate spinal stenosis.  several admissions to the hospital for abdominal pain. One was in late December 2013,  any in February 2014 .  Weight was 147 pounds September 2013, down to 138 pounds in February 2014, down to 131 pounds. Weight has improved on today's visit now up to 143 pounds   hip surgery in March 2014. She had a complicated recovery with bronchitis requiring long period of rehabilitation.  She walks with a cane. Continues to have rare abdominal pain. She did take laxatives for a short period of time, now has frequent bowel movements without laxatives.  She reports having hypoxia that her visit with [facility]. Ambulation in the office today showed saturations maintained in the mid 90s. Minimal prior smoking history She does have chronic mild shortness of breath. Continues to have significant back pain, leg pain She would like to start an exercise program at [facility]  Previous echo in 05/2009 showed normal LV function without valvular abnormalities   stress test was negative for ischemia.   carotid ultrasound last year showed mild bilateral disease  Allergies  Allergen Reactions  . Sulfa Antibiotics     Other reaction(s): Dizziness  . Sulfonamide Derivatives Other (See Comments)    Reaction:  Dizziness     Current Outpatient Prescriptions on File Prior to Visit  Medication Sig Dispense Refill  . albuterol (PROVENTIL HFA;VENTOLIN HFA) 108 (90 BASE) MCG/ACT inhaler Inhale 2 puffs into the lungs every 6 (six) hours as needed for wheezing or shortness of breath.     Marland Kitchen alendronate (FOSAMAX) 70 MG tablet Take 70 mg by mouth once a week. Take with a  full glass of water on an empty stomach.    Marland Kitchen amLODipine (NORVASC) 10 MG tablet Take 1 tablet (10 mg total) by mouth daily. 90 tablet 3  . aspirin EC 81 MG tablet Take 81 mg by mouth daily.    Marland Kitchen atorvastatin (LIPITOR) 80 MG tablet Take 1 tablet (80 mg total) by mouth daily. 90 tablet 4  . calcium citrate-vitamin D (CITRACAL+D) 315-200 MG-UNIT per tablet Take 1 tablet by mouth daily.    . fluticasone (FLONASE) 50 MCG/ACT  nasal spray Place 2 sprays into both nostrils daily.    . fluticasone furoate-vilanterol (BREO ELLIPTA) 100-25 MCG/INH AEPB Inhale 1 puff into the lungs daily.    . furosemide (LASIX) 20 MG tablet Take 20 mg by mouth as needed.    Marland Kitchen levothyroxine (SYNTHROID, LEVOTHROID) 88 MCG tablet take 1 tablet by mouth once daily 90 tablet 3  . metaxalone (SKELAXIN) 800 MG tablet Take 1 tablet (800 mg total) by mouth 3 (three) times daily as needed for muscle spasms. 15 tablet 0  . Multiple Vitamins-Minerals (PRESERVISION AREDS PO) Take 1 capsule by mouth daily.     Marland Kitchen omeprazole (PRILOSEC) 20 MG capsule Take 20 mg by mouth daily.    Marland Kitchen oxybutynin (DITROPAN) 5 MG tablet Take 5 mg by mouth at bedtime.    . potassium chloride (K-DUR) 10 MEQ tablet Take 10 mEq by mouth as needed.    . senna (SENOKOT) 8.6 MG TABS tablet Take 1 tablet by mouth as needed for mild constipation.    . sertraline (ZOLOFT) 100 MG tablet Take 1.5 tablets (150 mg total) by mouth daily. 45 tablet 3  . tiotropium (SPIRIVA HANDIHALER) 18 MCG inhalation capsule Place 1 capsule (18 mcg total) into inhaler and inhale daily. 90 capsule 0   No current facility-administered medications on file prior to visit.    Past Medical History  Diagnosis Date  . Coronary artery disease     a. s/p 2 vessel CABG 1989; b. echo 2003: nl; c. cath 2006: patent grafts, EF 65%; d. nuclear stress test 2007: no ischemia, EF 81%  . Hyperlipidemia   . Hypertension   . Bradycardia     hx of it and fatigue with beta blockade  . History of hyperkalemia   . Carotid artery disease ([place])     s/p carotid endarterectomy  . Stroke [facility]) 1980s    left brain  . Mitral valve prolapse   . Osteoporosis   . Asthma   . Degenerative disc disease   . Spinal stenosis in cervical region   . Norovirus 2014    Past Surgical History  Procedure Laterality Date  . Coronary artery bypass graft    . Carotid endarterectomy      left  . Cholecystectomy    . Abdominal  hysterectomy    . Bladder suspension    . Hip surgery  02/2011    right    Social History  reports that she has quit smoking. She has never used smokeless tobacco. She reports that she does not drink alcohol or use illicit drugs.  Family History family history includes Stroke in her father; Throat cancer in her mother.  Review of Systems  Respiratory: Negative.   Cardiovascular: Positive for chest pain.  Gastrointestinal: Negative.   Musculoskeletal: Positive for back pain and gait problem.  Skin: Negative.   Neurological: Negative.   Hematological: Negative.   Psychiatric/Behavioral: Negative.   All other systems reviewed and are negative.   BP 140/70 mmHg  Pulse 58  Ht 5\' 5"  (1.651 m)  Wt 138 lb 12 oz (62.937 kg)  BMI 23.09 kg/m2  SpO2 97%  Physical Exam  Constitutional: She is oriented to person, place, and time. She appears well-developed and well-nourished.  HENT:  Head: Normocephalic.  Nose: Nose normal.  Mouth/Throat: Oropharynx is clear and moist.  Eyes: Conjunctivae are normal. Pupils are equal, round, and reactive to light.  Neck: Normal range of motion. Neck supple. No JVD present.  Cardiovascular: Normal rate, regular rhythm, S1 normal, S2 normal, normal heart sounds and intact distal pulses.  Exam reveals no gallop and no friction rub.   No murmur heard. Trace lower extremity edema  Pulmonary/Chest: Effort normal. No respiratory distress. She has decreased breath sounds in the left lower field. She has no wheezes. She has rales. She exhibits no tenderness.  Rales worse at the left base, also middle left  Abdominal: Soft. Bowel sounds are normal. She exhibits no distension. There is no tenderness.  Musculoskeletal: Normal range of motion. She exhibits no edema or tenderness.  Lymphadenopathy:    She has no cervical adenopathy.  Neurological: She is alert and oriented to person, place, and time. Coordination normal.  Skin: Skin is warm and dry. No rash  noted. No erythema.  Psychiatric: She has a normal mood and affect. Her behavior is normal. Judgment and thought content normal.    Assessment and Plan  Nursing note and vitals reviewed.

## 2015-08-10 NOTE — Patient Instructions (Signed)
You are doing well. No medication changes were made.  Please call the office if you have more chest pain symptoms  Please call us if you have new issues that need to be addressed before your next appt.  Your physician wants you to follow-up in: 6 months.  You will receive a reminder letter in the mail two months in advance. If you don't receive a letter, please call our office to schedule the follow-up appointment.   

## 2015-08-12 DIAGNOSIS — E039 Hypothyroidism, unspecified: Secondary | ICD-10-CM | POA: Diagnosis not present

## 2015-08-12 DIAGNOSIS — M810 Age-related osteoporosis without current pathological fracture: Secondary | ICD-10-CM | POA: Diagnosis not present

## 2015-08-13 ENCOUNTER — Ambulatory Visit (INDEPENDENT_AMBULATORY_CARE_PROVIDER_SITE_OTHER): Payer: Medicare Other | Admitting: Family Medicine

## 2015-08-13 ENCOUNTER — Encounter: Payer: Self-pay | Admitting: Family Medicine

## 2015-08-13 VITALS — BP 120/52 | HR 72 | Temp 97.8°F | Resp 16 | Wt 138.0 lb

## 2015-08-13 DIAGNOSIS — I25719 Atherosclerosis of autologous vein coronary artery bypass graft(s) with unspecified angina pectoris: Secondary | ICD-10-CM

## 2015-08-13 DIAGNOSIS — R531 Weakness: Secondary | ICD-10-CM

## 2015-08-13 NOTE — Progress Notes (Signed)
Subjective:    Patient ID: Chelsea [name], female    DOB: 1923/12/05, 82 y.o.   MRN: [id number]  HPI  Follow up Hospitalization  Patient was admitted to [facility] on 08/05/2015 and discharged on 08/06/2015. She was treated for hand pain and diagnosed with acute tenosynovitis. Treatment for this included prednisone and oxycodone-acetaminophen. She reports excellent compliance with treatmen She reports this condition is Improved. Patient reports she took the full 4 days of prednisone and reports that she only had to take 2 doses oxycodone on the first day. Patient reports that her right arm still has some tingling. Patient was also advised to wear a brace but dose not wear it everyday. Patient reports moderate improvement with brace.  Did have recurrent sinus infection and then the cold after she feel.  Just feels like she just gets finished with one thing add then has another.  Seeing Endocrinology for osteoporosis.  Seeing Dr. Raul Del next Monday.  Also seeing rheumatologist for arthritis and gout.     Review of Systems  Constitutional: Negative.   HENT: Negative.   Respiratory: Negative.    Allergies  Allergen Reactions  . Sulfa Antibiotics     Other reaction(s): Dizziness  . Sulfonamide Derivatives Other (See Comments)    Reaction:  Dizziness     Patient Active Problem List   Diagnosis Date Noted  . Polyarthritis 08/05/2015  . Pulmonary fibrosis ([place]) 05/11/2015  . SOB (shortness of breath)   . Weakness   . 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 Peripheral blood vessel disorder ([place]) 02/09/2015  . 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 OP (osteoporosis) 02/09/2015  . Billowing mitral valve 02/09/2015  . Degeneration macular 02/09/2015  . Hypercholesteremia 02/09/2015  . Adult hypothyroidism 02/09/2015  . Cannot sleep 02/09/2015  . 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 Presence of aortocoronary bypass graft 02/09/2015  . 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 Esophagitis, reflux  02/09/2015  . Diverticulitis 02/09/2015  . Colon, diverticulosis 02/09/2015  . Constipation due to opioid therapy 02/09/2015  . CAD in native artery 02/09/2015  . Allergic rhinitis 02/09/2015  . Back ache 02/09/2015  . Airway hyperreactivity 02/09/2015  . COPD (chronic obstructive pulmonary disease) ([place]) 01/15/2015  . GERD (gastroesophageal reflux disease) 12/23/2014  . Spinal stenosis at L4-L5 level 12/16/2014  . Lumbar nerve root compression 12/16/2014  . Compression fracture of lumbar vertebra ([place]) 12/16/2014  . Lumbar canal stenosis 11/04/2013  . Neuritis or radiculitis due to rupture of lumbar intervertebral disc 11/04/2013  . DDD (degenerative disc disease), lumbar 11/04/2013  . Degeneration of intervertebral disc of lumbar region 11/04/2013  . Dermatophytosis of nail 03/31/2013  . Pain in limb 03/31/2013  . HTN (hypertension) 07/24/2011  . Hyperlipidemia 01/31/2010  . Carotid artery stenosis 12/21/2009  . CORONARY ATHEROSLERO AUTOL VEIN BYPASS GRAFT 05/25/2009  . Dyspnea on exertion 05/25/2009       Objective:   Physical Exam  Constitutional: She is oriented to person, place, and time. She appears well-developed and well-nourished.  Pulmonary/Chest: Effort normal.  Crackles at bases.   Musculoskeletal: Tenderness: below left breast to palpation.    Neurological: She is alert and oriented to person, place, and time.   BP 120/52 mmHg  Pulse 72  Temp(Src) 97.8 F (36.6 C) (Oral)  Resp 16  Wt 138 lb (62.596 kg)      Assessment & Plan:  1. Weakness Secondary to multiple medical problems as noted above.  Patient to be very careful when ambulating.  Follow up with Endocrinology and  Rheumatology as scheduled.  Follow up with [facility] next year.   Patient was seen and examined by Jerrell Belfast, MD, and note scribed by Renaldo Fiddler, CMA. I have reviewed the document for accuracy and completeness and I agree with above. Jerrell Belfast, MD     Margarita Rana, MD

## 2015-08-15 ENCOUNTER — Other Ambulatory Visit: Payer: Self-pay | Admitting: Family Medicine

## 2015-08-15 DIAGNOSIS — R350 Frequency of micturition: Secondary | ICD-10-CM

## 2015-08-16 DIAGNOSIS — M810 Age-related osteoporosis without current pathological fracture: Secondary | ICD-10-CM | POA: Diagnosis not present

## 2015-08-16 DIAGNOSIS — J8410 Pulmonary fibrosis, unspecified: Secondary | ICD-10-CM | POA: Diagnosis not present

## 2015-08-16 DIAGNOSIS — J439 Emphysema, unspecified: Secondary | ICD-10-CM | POA: Diagnosis not present

## 2015-08-23 DIAGNOSIS — M79641 Pain in right hand: Secondary | ICD-10-CM | POA: Diagnosis not present

## 2015-08-23 DIAGNOSIS — M1990 Unspecified osteoarthritis, unspecified site: Secondary | ICD-10-CM | POA: Diagnosis not present

## 2015-08-23 DIAGNOSIS — G5601 Carpal tunnel syndrome, right upper limb: Secondary | ICD-10-CM | POA: Diagnosis not present

## 2015-08-23 DIAGNOSIS — M810 Age-related osteoporosis without current pathological fracture: Secondary | ICD-10-CM | POA: Diagnosis not present

## 2015-08-24 ENCOUNTER — Other Ambulatory Visit: Payer: Self-pay | Admitting: Family Medicine

## 2015-10-12 DIAGNOSIS — M5136 Other intervertebral disc degeneration, lumbar region: Secondary | ICD-10-CM | POA: Diagnosis not present

## 2015-10-12 DIAGNOSIS — M5416 Radiculopathy, lumbar region: Secondary | ICD-10-CM | POA: Diagnosis not present

## 2015-10-12 DIAGNOSIS — M4806 Spinal stenosis, lumbar region: Secondary | ICD-10-CM | POA: Diagnosis not present

## 2015-10-13 ENCOUNTER — Ambulatory Visit (INDEPENDENT_AMBULATORY_CARE_PROVIDER_SITE_OTHER): Payer: Medicare Other | Admitting: Podiatry

## 2015-10-13 ENCOUNTER — Encounter: Payer: Self-pay | Admitting: Podiatry

## 2015-10-13 DIAGNOSIS — Q828 Other specified congenital malformations of skin: Secondary | ICD-10-CM

## 2015-10-13 DIAGNOSIS — B351 Tinea unguium: Secondary | ICD-10-CM | POA: Diagnosis not present

## 2015-10-13 DIAGNOSIS — M79676 Pain in unspecified toe(s): Secondary | ICD-10-CM | POA: Diagnosis not present

## 2015-10-13 NOTE — Progress Notes (Signed)
She presents today with a chief complaint of painful elongated toenails and calluses between her toes. She would like to have all of this taken care of today.  Objective: Pulses are palpable. Neurologic sensorium is intact. Toenails are thick yellow dystrophic likely mycotic and sharply incurvated. She has multiple porokeratotic lesions to the medial aspect of the second digits bilateral.  Assessment: Porokeratosis and pain in limb secondary to onychomycosis.  Plan: Debridement of toenails 1 through 5 bilateral. Debridement of all reactive hyperkeratoses.

## 2015-10-18 ENCOUNTER — Other Ambulatory Visit: Payer: Self-pay | Admitting: Family Medicine

## 2015-10-19 DIAGNOSIS — H353290 Exudative age-related macular degeneration, unspecified eye, stage unspecified: Secondary | ICD-10-CM | POA: Diagnosis not present

## 2015-10-22 DIAGNOSIS — H353290 Exudative age-related macular degeneration, unspecified eye, stage unspecified: Secondary | ICD-10-CM | POA: Diagnosis not present

## 2015-10-22 DIAGNOSIS — H353211 Exudative age-related macular degeneration, right eye, with active choroidal neovascularization: Secondary | ICD-10-CM | POA: Diagnosis not present

## 2015-11-05 DIAGNOSIS — L219 Seborrheic dermatitis, unspecified: Secondary | ICD-10-CM | POA: Diagnosis not present

## 2015-11-09 DIAGNOSIS — E559 Vitamin D deficiency, unspecified: Secondary | ICD-10-CM | POA: Diagnosis not present

## 2015-11-09 DIAGNOSIS — E349 Endocrine disorder, unspecified: Secondary | ICD-10-CM | POA: Diagnosis not present

## 2015-11-09 DIAGNOSIS — M810 Age-related osteoporosis without current pathological fracture: Secondary | ICD-10-CM | POA: Diagnosis not present

## 2015-11-11 ENCOUNTER — Ambulatory Visit (INDEPENDENT_AMBULATORY_CARE_PROVIDER_SITE_OTHER): Payer: Medicare Other | Admitting: Physician Assistant

## 2015-11-11 ENCOUNTER — Encounter: Payer: Self-pay | Admitting: Physician Assistant

## 2015-11-11 ENCOUNTER — Ambulatory Visit
Admission: RE | Admit: 2015-11-11 | Discharge: 2015-11-11 | Disposition: A | Discharge status: Home / Self Care | Payer: Medicare Other | Source: Ambulatory Visit | Attending: Physician Assistant | Admitting: Physician Assistant

## 2015-11-11 VITALS — BP 110/58 | HR 69 | Temp 98.2°F | Resp 16 | Wt 143.2 lb

## 2015-11-11 DIAGNOSIS — R195 Other fecal abnormalities: Secondary | ICD-10-CM | POA: Diagnosis not present

## 2015-11-11 DIAGNOSIS — R1011 Right upper quadrant pain: Secondary | ICD-10-CM

## 2015-11-11 DIAGNOSIS — Z96641 Presence of right artificial hip joint: Secondary | ICD-10-CM | POA: Diagnosis not present

## 2015-11-11 DIAGNOSIS — M25551 Pain in right hip: Secondary | ICD-10-CM | POA: Insufficient documentation

## 2015-11-11 DIAGNOSIS — I25719 Atherosclerosis of autologous vein coronary artery bypass graft(s) with unspecified angina pectoris: Secondary | ICD-10-CM | POA: Diagnosis not present

## 2015-11-11 DIAGNOSIS — K5900 Constipation, unspecified: Secondary | ICD-10-CM

## 2015-11-11 DIAGNOSIS — Z471 Aftercare following joint replacement surgery: Secondary | ICD-10-CM | POA: Diagnosis not present

## 2015-11-11 DIAGNOSIS — I959 Hypotension, unspecified: Secondary | ICD-10-CM

## 2015-11-11 NOTE — Progress Notes (Addendum)
Patient: Chelsea [name] Female    DOB: 06-20-1923   82 y.o.   MRN: [id number] Visit Date: 11/11/2015  Today's Provider: Mar Daring, PA-C   Chief Complaint  Patient presents with  . Pain on right side   Subjective:    HPI  Chelsea [name] is here today with c/o hurting on the right side of of her upper and lower abdomen. She reports that it has been several weeks that she developed the pain but now she cannot ignore it. She does not have regular BM. Uses Miralax prn. Is able to pass gas. Most recent BM was 2 days ago and reported to be hard balls. She has had appendectomy, cholecystectomy and total hysterectomy.      Allergies  Allergen Reactions  . Sulfa Antibiotics     Other reaction(s): Dizziness  . Sulfonamide Derivatives Other (See Comments)    Reaction:  Dizziness    Previous Medications   AMLODIPINE (NORVASC) 10 MG TABLET    Take 1 tablet (10 mg total) by mouth daily.   ASCORBIC ACID (VITAMIN C) 250 MG TABLET    Take 1 tablet by mouth daily.   ASPIRIN EC 81 MG TABLET    Take 81 mg by mouth daily.   ATORVASTATIN (LIPITOR) 80 MG TABLET    Take 1 tablet (80 mg total) by mouth daily.   CALCIUM CARBONATE-VIT D-MIN (CALCIUM 1200 PO)    Take by mouth daily.   CLOBETASOL (TEMOVATE) 0.05 % EXTERNAL SOLUTION    apply to affected area twice a day AVOID FACE, GROIN, UNDERARM   DIAZEPAM (VALIUM) 5 MG TABLET    Take by mouth.   FLUTICASONE (FLONASE) 50 MCG/ACT NASAL SPRAY    Place 2 sprays into both nostrils daily.   FLUTICASONE FUROATE-VILANTEROL (BREO ELLIPTA) 100-25 MCG/INH AEPB    Inhale 1 puff into the lungs daily.   HYDROCORTISONE 2.5 % CREAM    apply 1 APPLICATION to affected area ON THE FACE daily   LEVOTHYROXINE (SYNTHROID, LEVOTHROID) 88 MCG TABLET    take 1 tablet by mouth once daily   LORAZEPAM (ATIVAN) 0.5 MG TABLET    Take by mouth.   METAXALONE (SKELAXIN) 800 MG TABLET    Take 1 tablet (800 mg total) by mouth 3 (three) times daily as needed for muscle  spasms.   MULTIPLE VITAMINS-MINERALS (PRESERVISION AREDS PO)    Take 1 capsule by mouth daily.    NITROGLYCERIN (NITROSTAT) 0.4 MG SL TABLET    Place 1 tablet (0.4 mg total) under the tongue every 5 (five) minutes as needed.   OMEPRAZOLE (PRILOSEC) 20 MG CAPSULE    Take 20 mg by mouth daily.   OXYBUTYNIN (DITROPAN) 5 MG TABLET    Take 5 mg by mouth at bedtime. Reported on 11/11/2015   POTASSIUM CHLORIDE (K-DUR) 10 MEQ TABLET    Take 10 mEq by mouth as needed. Reported on 11/11/2015   SENNA (SENOKOT) 8.6 MG TABS TABLET    Take 1 tablet by mouth as needed for mild constipation. Reported on 11/11/2015   SERTRALINE (ZOLOFT) 100 MG TABLET    TAKE 1 TABLET DAILY   TIOTROPIUM (SPIRIVA HANDIHALER) 18 MCG INHALATION CAPSULE    Place 1 capsule (18 mcg total) into inhaler and inhale daily.   VITAMIN D, CHOLECALCIFEROL, 1000 UNITS CAPS    Take by mouth daily.    Review of Systems  Constitutional: Positive for fatigue. Negative for fever and chills.  HENT: Negative.  Eyes:       Has macular degeneration and getting injections in right eye  Respiratory: Negative for cough, chest tightness and shortness of breath (no more than normal).   Cardiovascular: Negative for chest pain, palpitations and leg swelling.  Gastrointestinal: Positive for abdominal pain and constipation. Negative for nausea, vomiting and anal bleeding.  Genitourinary: Negative for dysuria, urgency, frequency, flank pain, enuresis and pelvic pain.  Musculoskeletal: Positive for back pain (constantly).  Neurological: Negative for dizziness and headaches.    Social History  Substance Use Topics  . Smoking status: Former Research scientist (life sciences)  . Smokeless tobacco: Never Used     Comment: quit 50 years ago  . Alcohol Use: No   Objective:   BP 110/58 mmHg  Pulse 69  Temp(Src) 98.2 F (36.8 C) (Oral)  Resp 16  Wt 143 lb 3.2 oz (64.955 kg)  Physical Exam  Constitutional: She is oriented to person, place, and time. She appears well-developed and  well-nourished. No distress.  Cardiovascular: Normal rate, regular rhythm and normal heart sounds.  Exam reveals no gallop and no friction rub.   No murmur heard. Pulmonary/Chest: Effort normal and breath sounds normal. No respiratory distress. She has no wheezes. She has no rales.  Abdominal: Soft. Normal appearance and bowel sounds are normal. She exhibits no distension and no mass. There is no hepatosplenomegaly. There is tenderness (most in RUQ and LUQ) in the right upper quadrant, right lower quadrant, left upper quadrant and left lower quadrant. There is no rebound, no guarding and no CVA tenderness.  She was also tender to palpation in the right groin deep.  Neurological: She is alert and oriented to person, place, and time.  Skin: Skin is warm and dry. She is not diaphoretic.        Assessment & Plan:     1. RUQ pain I will get an x-ray of the abdomen to rule in or rule out constipation as the cause of her abdominal discomfort. If constipation is the cause I will have her increase her use of MiraLAX. I'll also have her increase fluid intake. At follow-up visit it may be worthwhile to consider discontinuing the oxybutynin if tolerated as this may worsen her constipation. - DG Abd 2 Views; Future  2. Right hip pain She did have pain on palpation with deep palpation into the right groin. She does have history of significant osteoporosis and has had a right hip fracture with total replacement in the past. Being that she was having this deep pain to palpation I will get a x-ray to make sure that the bony implants around the replacement are intact and without fracture. I will follow-up pending these results. - DG HIP UNILAT W OR W/O PELVIS 2-3 VIEWS RIGHT; Future  3. Hypotension, unspecified hypotension type She also complains of some increased fatigue. I have noticed upon review of her blood pressures that over the last month and a half her blood pressures have gradually been decreasing. I  did advise her to make sure that she is getting plenty to drink. She voiced understanding. May consider lowering amlodipine dose if okay with Dr. Rockey Situ. She follows up with him in August 2017.  *Addend: Xray showed constipation. Will treat with miralax daily until regular daily BM and then go to as needed use. Make sure to stay well hydrated.      Mar Daring, PA-C  [facility]

## 2015-11-11 NOTE — Patient Instructions (Signed)

## 2015-11-16 DIAGNOSIS — M5136 Other intervertebral disc degeneration, lumbar region: Secondary | ICD-10-CM | POA: Diagnosis not present

## 2015-11-16 DIAGNOSIS — M4806 Spinal stenosis, lumbar region: Secondary | ICD-10-CM | POA: Diagnosis not present

## 2015-11-16 DIAGNOSIS — M5416 Radiculopathy, lumbar region: Secondary | ICD-10-CM | POA: Diagnosis not present

## 2015-12-03 DIAGNOSIS — H353211 Exudative age-related macular degeneration, right eye, with active choroidal neovascularization: Secondary | ICD-10-CM | POA: Diagnosis not present

## 2015-12-15 ENCOUNTER — Telehealth: Payer: Self-pay | Admitting: Cardiovascular Disease

## 2015-12-15 NOTE — Telephone Encounter (Signed)
She would like to know if you are ok w/ her taking Prolia.

## 2015-12-15 NOTE — Telephone Encounter (Signed)
Patient wants Dr. Donivan Scull opinion on starting a new med rx by Dr. Maretta Bees at [facility].    She has been rx'd Prolia q 6 month injection for bone health and one of the side effects is sob  Patient is concerned and wants to check with [place] before starting any medication please call.

## 2015-12-16 DIAGNOSIS — M5136 Other intervertebral disc degeneration, lumbar region: Secondary | ICD-10-CM | POA: Diagnosis not present

## 2015-12-16 DIAGNOSIS — M5416 Radiculopathy, lumbar region: Secondary | ICD-10-CM | POA: Diagnosis not present

## 2015-12-16 DIAGNOSIS — M4806 Spinal stenosis, lumbar region: Secondary | ICD-10-CM | POA: Diagnosis not present

## 2015-12-17 ENCOUNTER — Other Ambulatory Visit: Payer: Self-pay | Admitting: Cardiovascular Disease

## 2015-12-17 DIAGNOSIS — L299 Pruritus, unspecified: Secondary | ICD-10-CM | POA: Diagnosis not present

## 2015-12-17 DIAGNOSIS — I6523 Occlusion and stenosis of bilateral carotid arteries: Secondary | ICD-10-CM

## 2015-12-17 DIAGNOSIS — L219 Seborrheic dermatitis, unspecified: Secondary | ICD-10-CM | POA: Diagnosis not present

## 2015-12-17 NOTE — Telephone Encounter (Signed)
Would try the medication, Typically we do not see significant problems in our office without medication

## 2015-12-17 NOTE — Telephone Encounter (Signed)
Spoke w/ pt. Advised her of Dr. Donivan Scull recommendation.  Asked her to call back w/ any other questions or concerns.

## 2015-12-28 ENCOUNTER — Ambulatory Visit: Payer: Medicare Other

## 2015-12-28 DIAGNOSIS — I6523 Occlusion and stenosis of bilateral carotid arteries: Secondary | ICD-10-CM

## 2016-01-12 ENCOUNTER — Encounter: Payer: Self-pay | Admitting: Podiatry

## 2016-01-12 ENCOUNTER — Ambulatory Visit (INDEPENDENT_AMBULATORY_CARE_PROVIDER_SITE_OTHER): Payer: Medicare Other | Admitting: Podiatry

## 2016-01-12 DIAGNOSIS — Q828 Other specified congenital malformations of skin: Secondary | ICD-10-CM

## 2016-01-12 DIAGNOSIS — M79676 Pain in unspecified toe(s): Secondary | ICD-10-CM

## 2016-01-12 DIAGNOSIS — B351 Tinea unguium: Secondary | ICD-10-CM

## 2016-01-12 NOTE — Progress Notes (Signed)
She presents today with chief complaint of painful elongated toenails.  Objective: Pulses are strongly palpable. Neurologic sensorium is intact. Deep tendon reflexes are intact. Toenails are thick yellow dystrophic onychomycotic and painful on palpation.  Assessment: Pain in limb secondary to onychomycosis and elongated nails.  Plan: Debridement of toenails 1 through 5 bilateral.

## 2016-01-14 DIAGNOSIS — H353211 Exudative age-related macular degeneration, right eye, with active choroidal neovascularization: Secondary | ICD-10-CM | POA: Diagnosis not present

## 2016-01-26 NOTE — Telephone Encounter (Signed)
error 

## 2016-02-03 DIAGNOSIS — M810 Age-related osteoporosis without current pathological fracture: Secondary | ICD-10-CM | POA: Diagnosis not present

## 2016-02-04 DIAGNOSIS — L219 Seborrheic dermatitis, unspecified: Secondary | ICD-10-CM | POA: Diagnosis not present

## 2016-02-04 DIAGNOSIS — L820 Inflamed seborrheic keratosis: Secondary | ICD-10-CM | POA: Diagnosis not present

## 2016-02-07 ENCOUNTER — Ambulatory Visit (INDEPENDENT_AMBULATORY_CARE_PROVIDER_SITE_OTHER): Payer: Medicare Other | Admitting: Cardiovascular Disease

## 2016-02-07 ENCOUNTER — Encounter: Payer: Self-pay | Admitting: Cardiovascular Disease

## 2016-02-07 VITALS — BP 138/54 | HR 60 | Ht 69.0 in | Wt 143.2 lb

## 2016-02-07 DIAGNOSIS — I25719 Atherosclerosis of autologous vein coronary artery bypass graft(s) with unspecified angina pectoris: Secondary | ICD-10-CM | POA: Diagnosis not present

## 2016-02-07 DIAGNOSIS — E785 Hyperlipidemia, unspecified: Secondary | ICD-10-CM

## 2016-02-07 DIAGNOSIS — I10 Essential (primary) hypertension: Secondary | ICD-10-CM

## 2016-02-07 DIAGNOSIS — I2510 Atherosclerotic heart disease of native coronary artery without angina pectoris: Secondary | ICD-10-CM

## 2016-02-07 DIAGNOSIS — I6523 Occlusion and stenosis of bilateral carotid arteries: Secondary | ICD-10-CM

## 2016-02-07 DIAGNOSIS — J8410 Pulmonary fibrosis, unspecified: Secondary | ICD-10-CM

## 2016-02-07 MED ORDER — ALBUTEROL SULFATE HFA 108 (90 BASE) MCG/ACT IN AERS: 2.0000 | INHALATION_SPRAY | Freq: Four times a day (QID) | RESPIRATORY_TRACT | 2 refills | Status: DC | PRN

## 2016-02-07 MED ORDER — NITROGLYCERIN 0.4 MG SL SUBL: 0.4000 mg | SUBLINGUAL_TABLET | SUBLINGUAL | 6 refills | Status: AC | PRN

## 2016-02-07 NOTE — Progress Notes (Signed)
Cardiology Office Note  Date:  02/07/2016   ID:  Chelsea [name], DOB 09-06-1923, MRN [id number]  PCP:  Mar Daring, PA-C   Chief Complaint  Patient presents with  . Other    C/o sob. Meds reviewed verbally with pt.    HPI:  82 year-old woman with CAD s/p CABG, diastolic dysfunction, CEA on the left >10 years ago, now with <40% b/l carotid disease , mildly dilated left atrium, mild MR, presenting today for follow-up evaluation of her shortness of breath, carotid arterial disease  Lives at [address]  In follow-up today, she reports previous episodes of chest pain are resolved with Tums She has not required sublingual nitroglycerin Biggest complaint is urine and stoolleaking, Reports having loose stools, etiology unclear Tolerating high-dose Lipitor  Previous carotid ultrasound reviewe with her in detail 01/09/16 S/p CEA on the left <39% disease b/l  Denies having shortness of breath as on her previous office visit   currently not taking Lasix  EKG shows normal sinus rhythm with rate 60 bpm, no significant ST or T-wave changes  Other past medical history Previous trips  to the emergency room, had a fall 06/14/2015 went to the emergency room Had joint pain February 16 in her arm went to the emergency room was kept overnight August 2016 had severe pneumonia, long hospital stay  In follow-up today, she reports that her shortness of breath is mild, stable Walks with a walker  She did have one episode of chest pain 07/21/2015 symptoms lasting for 10 minutes Checked her blood pressure, heart rate everything was normal, felt a heaviness in her throat Took aspirin and symptoms seem to resolve on their own  Other past medical history reviewed ECHO 02/11/15 normal study PNA in 01/2015, also with pleural effusion on CXR  Chronic severe back pain from spinal stenosis. Previous problems with constipation, in the hospital January 2016, CT scan confirming  constipation  dizziness in the past, h/o fall with right hip fracture, right arm fracture, 3 ribs injured. She had a long recovery in [place] where the injury occurred. She is now back at [facility]. Previous upper respiratory infection. 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 She has had significant pain, cortisone shots x3 to her hip now wearing a TENS unit for pain. She reports having moderate spinal stenosis.  several admissions to the hospital for abdominal pain. One was in late December 2013, any in February 2014 .  Weight was 147 pounds September 2013, down to 138 pounds in February 2014, down to 131 pounds. Weight has improved on today's visit now up to 143 pounds   hip surgery in March 2014. She had a complicated recovery with bronchitis requiring long period of rehabilitation.  She walks with a cane.  rare abdominal pain. She did take laxatives for a short period of time, now has frequent bowel movements without laxatives.  Minimal prior smoking history chronic mild shortness of breath. Continues to have significant back pain, leg pain  Previous echo in 05/2009 showed normal LV function without valvular abnormalities   stress test was negative for ischemia.   carotid ultrasound last year showed mild bilateral disease   PMH:   has a past medical history of Asthma; Bradycardia; Carotid artery disease ([place]); Coronary artery disease; Degenerative disc disease; History of hyperkalemia; Hyperlipidemia; Hypertension; Mitral valve prolapse; Norovirus (2014); Osteoporosis; Spinal stenosis in cervical region; and Stroke ([place]) (1980s).  PSH:    Past Surgical History:  Procedure Laterality  Date  . ABDOMINAL HYSTERECTOMY    . BLADDER SUSPENSION    . CAROTID ENDARTERECTOMY     left  . CHOLECYSTECTOMY    . CORONARY ARTERY BYPASS GRAFT    . HIP SURGERY  02/2011   right    Current Outpatient Prescriptions  Medication Sig Dispense Refill  . amLODipine (NORVASC) 10 MG tablet Take 1  tablet (10 mg total) by mouth daily. 90 tablet 3  . ascorbic acid (VITAMIN C) 250 MG tablet Take 1 tablet by mouth daily.    Marland Kitchen aspirin EC 81 MG tablet Take 81 mg by mouth daily.    Marland Kitchen atorvastatin (LIPITOR) 80 MG tablet Take 1 tablet (80 mg total) by mouth daily. 90 tablet 4  . Calcium Carbonate-Vit D-Min (CALCIUM 1200 PO) Take by mouth daily.    . clobetasol (TEMOVATE) 0.05 % external solution apply to affected area twice a day AVOID FACE, GROIN, UNDERARM  0  . denosumab (PROLIA) 60 MG/ML SOLN injection Inject 60 mg into the skin every 6 (six) months. Administer in upper arm, thigh, or abdomen    . fluticasone (FLONASE) 50 MCG/ACT nasal spray Place 2 sprays into both nostrils daily.    . fluticasone furoate-vilanterol (BREO ELLIPTA) 100-25 MCG/INH AEPB Inhale 1 puff into the lungs daily.    . hydrocortisone 2.5 % cream apply 1 APPLICATION to affected area ON THE FACE daily  0  . levothyroxine (SYNTHROID, LEVOTHROID) 88 MCG tablet take 1 tablet by mouth once daily 90 tablet 3  . LORazepam (ATIVAN) 0.5 MG tablet Take by mouth.    . metaxalone (SKELAXIN) 800 MG tablet Take 1 tablet (800 mg total) by mouth 3 (three) times daily as needed for muscle spasms. 15 tablet 0  . Multiple Vitamins-Minerals (PRESERVISION AREDS PO) Take 1 capsule by mouth daily.     . nitroGLYCERIN (NITROSTAT) 0.4 MG SL tablet Place 1 tablet (0.4 mg total) under the tongue every 5 (five) minutes as needed. 25 tablet 6  . NON FORMULARY Eye injection as directed Right eye.    Marland Kitchen omeprazole (PRILOSEC) 20 MG capsule Take 20 mg by mouth daily.    Marland Kitchen oxybutynin (DITROPAN) 5 MG tablet Take 5 mg by mouth at bedtime. Reported on 11/11/2015    . potassium chloride (K-DUR) 10 MEQ tablet Take 10 mEq by mouth as needed. Reported on 11/11/2015    . sertraline (ZOLOFT) 100 MG tablet TAKE 1 TABLET DAILY 90 tablet 3  . tiotropium (SPIRIVA HANDIHALER) 18 MCG inhalation capsule Place 1 capsule (18 mcg total) into inhaler and inhale daily. 90 capsule  0  . Vitamin D, Cholecalciferol, 1000 units CAPS Take by mouth daily.    Marland Kitchen albuterol (PROVENTIL HFA;VENTOLIN HFA) 108 (90 Base) MCG/ACT inhaler Inhale 2 puffs into the lungs every 6 (six) hours as needed for wheezing or shortness of breath. 1 Inhaler 2  . diazepam (VALIUM) 5 MG tablet Take by mouth.     No current facility-administered medications for this visit.      Allergies:   Sulfa antibiotics and Sulfonamide derivatives   Social History:  The patient  reports that she has quit smoking. She has never used smokeless tobacco. She reports that she does not drink alcohol or use drugs.   Family History:   family history includes Stroke in her father; Throat cancer in her mother.    Review of Systems: Review of Systems  Constitutional: Negative.   Respiratory: Positive for shortness of breath.   Cardiovascular: Negative.   Gastrointestinal:  Negative.   Musculoskeletal:       Gait instability  Neurological: Negative.   Psychiatric/Behavioral: Negative.   All other systems reviewed and are negative.    PHYSICAL EXAM: VS:  BP (!) 138/54 (BP Location: Left Arm, Patient Position: Sitting, Cuff Size: Normal)   Pulse 60   Ht 5\' 9"  (1.753 m)   Wt 143 lb 4 oz (65 kg)   BMI 21.15 kg/m  , BMI Body mass index is 21.15 kg/m. GEN: Well nourished, well developed, in no acute distress  HEENT: normal  Neck: no JVD, carotid bruits, or masses Cardiac: RRR; no murmurs, rubs, or gallops,no edema  Respiratory:  clear to auscultation bilaterally, normal work of breathing GI: soft, nontender, nondistended, + BS MS: no deformity or atrophy  Skin: warm and dry, no rash Neuro:  Strength and sensation are intact Psych: euthymic mood, full affect    Recent Labs: 08/05/2015: ALT 13 08/06/2015: BUN 43; Creatinine, Ser 1.21; Hemoglobin 9.6; Platelets 283; Potassium 5.1; Sodium 135    Lipid Panel Lab Results  Component Value Date   CHOL 142 05/08/2014   HDL 57 05/08/2014   LDLCALC 62  05/08/2014   TRIG 114 05/08/2014      Wt Readings from Last 3 Encounters:  02/07/16 143 lb 4 oz (65 kg)  11/11/15 143 lb 3.2 oz (65 kg)  08/13/15 138 lb (62.6 kg)       ASSESSMENT AND PLAN:  Essential hypertension - Plan: EKG 12-Lead Blood pressure is well controlled on today's visit. No changes made to the medications.  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 No changes to the medications were made.  CAD in native artery Currently with no symptoms of angina. No further workup at this time. Continue current medication regimen.  Pulmonary fibrosis ([place]) Confined to the left lower lobe Denies significant progression of her shortness of breath Pathology discussed with her    Total encounter time more than 25 minutes  Greater than 50% was spent in counseling and coordination of care with the patient  Disposition:   F/U  6 months   Orders Placed This Encounter  Procedures  . EKG 12-Lead     Signed, Esmond Plants, M.D., Ph.D. 02/07/2016  [place], [place]

## 2016-02-07 NOTE — Patient Instructions (Signed)
Medication Instructions:   We have refilled nitro and albuterol  Labwork:  No new labs  Testing/Procedures:  No new testing  Follow-Up: It was a pleasure seeing you in the office today. Please call us if you have new issues that need to be addressed before your next appt.  [phone]  Your physician wants you to follow-up in: 6 months.  You will receive a reminder letter in the mail two months in advance. If you don't receive a letter, please call our office to schedule the follow-up appointment.  If you need a refill on your cardiac medications before your next appointment, please call your pharmacy.

## 2016-02-16 ENCOUNTER — Encounter: Payer: Self-pay | Admitting: Emergency Medicine

## 2016-02-16 ENCOUNTER — Emergency Department
Admission: EM | Admit: 2016-02-16 | Discharge: 2016-02-16 | Disposition: A | Discharge status: Home / Self Care | Payer: Medicare Other | Attending: Emergency Medicine | Admitting: Emergency Medicine

## 2016-02-16 DIAGNOSIS — M5431 Sciatica, right side: Secondary | ICD-10-CM

## 2016-02-16 DIAGNOSIS — E039 Hypothyroidism, unspecified: Secondary | ICD-10-CM | POA: Insufficient documentation

## 2016-02-16 DIAGNOSIS — X501XXA Overexertion from prolonged static or awkward postures, initial encounter: Secondary | ICD-10-CM | POA: Insufficient documentation

## 2016-02-16 DIAGNOSIS — M5441 Lumbago with sciatica, right side: Secondary | ICD-10-CM | POA: Diagnosis not present

## 2016-02-16 DIAGNOSIS — Y929 Unspecified place or not applicable: Secondary | ICD-10-CM | POA: Insufficient documentation

## 2016-02-16 DIAGNOSIS — Z7982 Long term (current) use of aspirin: Secondary | ICD-10-CM | POA: Diagnosis not present

## 2016-02-16 DIAGNOSIS — J45909 Unspecified asthma, uncomplicated: Secondary | ICD-10-CM | POA: Diagnosis not present

## 2016-02-16 DIAGNOSIS — S39012A Strain of muscle, fascia and tendon of lower back, initial encounter: Secondary | ICD-10-CM | POA: Insufficient documentation

## 2016-02-16 DIAGNOSIS — I2510 Atherosclerotic heart disease of native coronary artery without angina pectoris: Secondary | ICD-10-CM | POA: Insufficient documentation

## 2016-02-16 DIAGNOSIS — Y999 Unspecified external cause status: Secondary | ICD-10-CM | POA: Diagnosis not present

## 2016-02-16 DIAGNOSIS — Y9389 Activity, other specified: Secondary | ICD-10-CM | POA: Diagnosis not present

## 2016-02-16 DIAGNOSIS — J449 Chronic obstructive pulmonary disease, unspecified: Secondary | ICD-10-CM | POA: Insufficient documentation

## 2016-02-16 DIAGNOSIS — Z87891 Personal history of nicotine dependence: Secondary | ICD-10-CM | POA: Diagnosis not present

## 2016-02-16 DIAGNOSIS — I10 Essential (primary) hypertension: Secondary | ICD-10-CM | POA: Insufficient documentation

## 2016-02-16 DIAGNOSIS — M545 Low back pain: Secondary | ICD-10-CM | POA: Diagnosis present

## 2016-02-16 MED ORDER — METHYLPREDNISOLONE SODIUM SUCC 125 MG IJ SOLR
60.0000 mg | Freq: Once | INTRAMUSCULAR | Status: AC
  Administered 2016-02-16: 60 mg via INTRAMUSCULAR
  Filled 2016-02-16: qty 2

## 2016-02-16 MED ORDER — NAPROXEN 500 MG PO TABS: 250.0000 mg | ORAL_TABLET | Freq: Two times a day (BID) | ORAL | 0 refills | Status: DC

## 2016-02-16 MED ORDER — DIAZEPAM 2 MG PO TABS
2.0000 mg | ORAL_TABLET | Freq: Once | ORAL | Status: AC
  Administered 2016-02-16: 2 mg via ORAL
  Filled 2016-02-16: qty 1

## 2016-02-16 MED ORDER — DIAZEPAM 2 MG PO TABS: 2.0000 mg | ORAL_TABLET | Freq: Three times a day (TID) | ORAL | 0 refills | Status: AC | PRN

## 2016-02-16 NOTE — ED Notes (Signed)
See triage note   States she twisted her back on Sunday while putting her walker in the car  Denies any trauma or urinary sx's  She live independently at [facility] and is now having a hard time getting around d/t pain

## 2016-02-16 NOTE — ED Provider Notes (Signed)
[facility] Emergency Department Provider Note  ____________________________________________  Time seen: Approximately 1:25 PM  I have reviewed the triage vital signs and the nursing notes.   HISTORY  Chief Complaint Back Pain (pulled muscle on sunday)    HPI Chelsea [name] is a 82 y.o. female , NAD, presents to emergency department with 3 day history of lower back pain. She is accompanied by a family friend who assists with history. Patient states back pain began when she was bending over to place her walker in a vehicle. Felt a sharp pain in her lower back that has worsened since onset. States the pain was in the right lower back but is now across the middle lower back.Pain can radiate into the hip and thigh. Patient has taken aspirin without significant relief of pain. Does have a history of chronic back pain which she receives injections with Dr. Sharlet Salina. She is due for follow-up and has called to schedule that appointment. Denies saddle paresthesias, loss of bowel or bladder control. Has not experienced any numbness, weakness, tingling. Denies any falls, injuries or traumas to the lower back. Has not had any chest pain, shortness of breath. Has not noted any redness, swelling, bruising, skin sores. Denies any changes in urinary or bowel habits and denies dysuria or hematuria. Has not had any fevers, chills.   Past Medical History:  Diagnosis Date  . Asthma   . Bradycardia    hx of it and fatigue with beta blockade  . Carotid artery disease ([place])    s/p carotid endarterectomy  . Coronary artery disease    a. s/p 2 vessel CABG 1989; b. echo 2003: nl; c. cath 2006: patent grafts, EF 65%; d. nuclear stress test 2007: no ischemia, EF 81%  . Degenerative disc disease   . History of hyperkalemia   . Hyperlipidemia   . Hypertension   . Mitral valve prolapse   . Norovirus 2014  . Osteoporosis   . Spinal stenosis in cervical region   . Stroke [facility]) 1980s    left brain    Patient Active Problem List   Diagnosis Date Noted  . Low BP 11/11/2015  . Polyarthritis 08/05/2015  . Pulmonary fibrosis ([place]) 05/11/2015  . SOB (shortness of breath)   . Weakness   . 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 Peripheral blood vessel disorder ([place]) 02/09/2015  . 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 OP (osteoporosis) 02/09/2015  . Billowing mitral valve 02/09/2015  . Degeneration macular 02/09/2015  . Hypercholesteremia 02/09/2015  . Adult hypothyroidism 02/09/2015  . Cannot sleep 02/09/2015  . 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 Presence of aortocoronary bypass graft 02/09/2015  . 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 Esophagitis, reflux 02/09/2015  . Diverticulitis 02/09/2015  . Colon, diverticulosis 02/09/2015  . Constipation due to opioid therapy 02/09/2015  . CAD in native artery 02/09/2015  . Allergic rhinitis 02/09/2015  . Back ache 02/09/2015  . COPD (chronic obstructive pulmonary disease) ([place]) 01/15/2015  . GERD (gastroesophageal reflux disease) 12/23/2014  . Spinal stenosis at L4-L5 level 12/16/2014  . Lumbar nerve root compression 12/16/2014  . Compression fracture of lumbar vertebra ([place]) 12/16/2014  . Lumbar canal stenosis 11/04/2013  . Neuritis or radiculitis due to rupture of lumbar intervertebral disc 11/04/2013  . DDD (degenerative disc disease), lumbar 11/04/2013  . Degeneration of intervertebral disc of lumbar region 11/04/2013  . Dermatophytosis of nail 03/31/2013  . Pain in limb 03/31/2013  . HTN (hypertension) 07/24/2011  . Hyperlipidemia 01/31/2010  . Carotid artery  stenosis 12/21/2009  . CORONARY ATHEROSLERO AUTOL VEIN BYPASS GRAFT 05/25/2009  . Dyspnea on exertion 05/25/2009    Past Surgical History:  Procedure Laterality Date  . ABDOMINAL HYSTERECTOMY    . BLADDER SUSPENSION    . CAROTID ENDARTERECTOMY     left  . CHOLECYSTECTOMY    . CORONARY ARTERY BYPASS GRAFT    . HIP SURGERY  02/2011   right    Prior to  Admission medications   Medication Sig Start Date End Date Taking? Authorizing Provider  albuterol (PROVENTIL HFA;VENTOLIN HFA) 108 (90 Base) MCG/ACT inhaler Inhale 2 puffs into the lungs every 6 (six) hours as needed for wheezing or shortness of breath. 02/07/16   Minna Merritts, MD  amLODipine (NORVASC) 10 MG tablet Take 1 tablet (10 mg total) by mouth daily. 04/30/15   Mar Daring, PA-C  ascorbic acid (VITAMIN C) 250 MG tablet Take 1 tablet by mouth daily.    Historical Provider, MD  aspirin EC 81 MG tablet Take 81 mg by mouth daily.    Historical Provider, MD  atorvastatin (LIPITOR) 80 MG tablet Take 1 tablet (80 mg total) by mouth daily. 11/03/14   Minna Merritts, MD  Calcium Carbonate-Vit D-Min (CALCIUM 1200 PO) Take by mouth daily.    Historical Provider, MD  clobetasol (TEMOVATE) 0.05 % external solution apply to affected area twice a day AVOID FACE, GROIN, UNDERARM 11/05/15   Historical Provider, MD  denosumab (PROLIA) 60 MG/ML SOLN injection Inject 60 mg into the skin every 6 (six) months. Administer in upper arm, thigh, or abdomen    Historical Provider, MD  diazepam (VALIUM) 2 MG tablet Take 1 tablet (2 mg total) by mouth every 8 (eight) hours as needed for muscle spasms. 02/16/16 02/21/16  Travonna Swindle L Michaiah Maiden, PA-C  fluticasone (FLONASE) 50 MCG/ACT nasal spray Place 2 sprays into both nostrils daily.    Historical Provider, MD  fluticasone furoate-vilanterol (BREO ELLIPTA) 100-25 MCG/INH AEPB Inhale 1 puff into the lungs daily.    Historical Provider, MD  hydrocortisone 2.5 % cream apply 1 APPLICATION to affected area ON THE FACE daily 11/05/15   Historical Provider, MD  levothyroxine (SYNTHROID, LEVOTHROID) 88 MCG tablet take 1 tablet by mouth once daily 04/07/15   Margarita Rana, MD  LORazepam (ATIVAN) 0.5 MG tablet Take by mouth.    Historical Provider, MD  metaxalone (SKELAXIN) 800 MG tablet Take 1 tablet (800 mg total) by mouth 3 (three) times daily as needed for muscle spasms. 02/18/15    Hillary Bow, MD  Multiple Vitamins-Minerals (PRESERVISION AREDS PO) Take 1 capsule by mouth daily.     Historical Provider, MD  naproxen (NAPROSYN) 500 MG tablet Take 0.5 tablets (250 mg total) by mouth 2 (two) times daily with a meal. 02/16/16   Makalya Nave L Keahi Mccarney, PA-C  nitroGLYCERIN (NITROSTAT) 0.4 MG SL tablet Place 1 tablet (0.4 mg total) under the tongue every 5 (five) minutes as needed. 02/07/16   Minna Merritts, MD  NON FORMULARY Eye injection as directed Right eye.    Historical Provider, MD  omeprazole (PRILOSEC) 20 MG capsule Take 20 mg by mouth daily.    Historical Provider, MD  oxybutynin (DITROPAN) 5 MG tablet Take 5 mg by mouth at bedtime. Reported on 11/11/2015    Historical Provider, MD  potassium chloride (K-DUR) 10 MEQ tablet Take 10 mEq by mouth as needed. Reported on 11/11/2015    Historical Provider, MD  sertraline (ZOLOFT) 100 MG tablet TAKE 1 TABLET DAILY 10/18/15  Margarita Rana, MD  tiotropium (SPIRIVA HANDIHALER) 18 MCG inhalation capsule Place 1 capsule (18 mcg total) into inhaler and inhale daily. 03/19/15   Margarita Rana, MD  Vitamin D, Cholecalciferol, 1000 units CAPS Take by mouth daily.    Historical Provider, MD    Allergies Sulfa antibiotics and Sulfonamide derivatives  Family History  Problem Relation Age of Onset  . Throat cancer Mother   . Stroke Father     Social History Social History  Substance Use Topics  . Smoking status: Former Research scientist (life sciences)  . Smokeless tobacco: Never Used     Comment: quit 50 years ago  . Alcohol use No     Review of Systems  Constitutional: No fever/chills, Fatigue Cardiovascular: No chest pain. Respiratory:  No shortness of breath. No wheezing.  Gastrointestinal: No abdominal pain.  No nausea, vomiting.   Genitourinary: Negative for dysuria, hematuria. No urinary hesitancy, urgency or increased frequency. Musculoskeletal: Positive lower back pain radiating into right leg.  Skin: Negative for rash, redness, bruising, swelling,  skin sores. Neurological: Negative for headaches, focal weakness or numbness. No tingling, saddle paresthesias, loss of bowel or bladder control  10-point ROS otherwise negative.  ____________________________________________   PHYSICAL EXAM:  VITAL SIGNS: ED Triage Vitals  Enc Vitals Group     BP 02/16/16 1306 131/72     Pulse Rate 02/16/16 1306 77     Resp 02/16/16 1306 17     Temp 02/16/16 1306 97.8 F (36.6 C)     Temp Source 02/16/16 1306 Oral     SpO2 02/16/16 1306 97 %     Weight 02/16/16 1307 140 lb (63.5 kg)     Height 02/16/16 1307 5\' 6"  (1.676 m)     Head Circumference --      Peak Flow --      Pain Score 02/16/16 1314 10     Pain Loc --      Pain Edu? --      Excl. in [place]? --      Constitutional: Alert and oriented. Well appearing and in no acute distress. Eyes: Conjunctivae are normal.  Head: Atraumatic. Neck: Supple with full range of motion. Hematological/Lymphatic/Immunilogical: No cervical lymphadenopathy. Cardiovascular: Good peripheral circulation. Respiratory: Normal respiratory effort without tachypnea or retractions.  Musculoskeletal: Positive right straight leg raise. Negative left straight leg raise. Right SI joint tenderness to palpation. No left SI joint tenderness to palpation. No central spinal lumbar or thoracic tenderness to palpation. Trace muscle spasm noted in the right lumbar paraspinal region. No lower extremity tenderness nor edema.  No joint effusions. Neurologic:  Normal speech and language. No gross focal neurologic deficits are appreciated. Sensation light touch of bilateral lower extremities intact. Skin:  Skin is warm, dry and intact. No rash, redness, swelling, bruising, petechia, skin sores noted. Psychiatric: Mood and affect are normal. Speech and behavior are normal. Patient exhibits appropriate insight and judgement.   ____________________________________________    LABS  None ____________________________________________  EKG  None ____________________________________________  RADIOLOGY  None ____________________________________________    PROCEDURES  Procedure(s) performed: None   Procedures   Medications  methylPREDNISolone sodium succinate (SOLU-MEDROL) 125 mg/2 mL injection 60 mg (60 mg Intramuscular Given 02/16/16 1414)  diazepam (VALIUM) tablet 2 mg (2 mg Oral Given 02/16/16 1414)     ____________________________________________   INITIAL IMPRESSION / ASSESSMENT AND PLAN / ED COURSE  Pertinent labs & imaging results that were available during my care of the patient were reviewed by me and considered in my medical  decision making (see chart for details).  Clinical Course  Comment By Time  Patient has tolerated Solu-Medrol and diazepam well without any side effects. Patient does note some decrease in her pain at this time and is ready for discharge. Braxton Feathers, PA-C 08/30 1428    Patient's diagnosis is consistent with Right-sided sciatica due to lumbar strain. Patient will be discharged home with prescriptions for naproxen and diazepam to take as directed. Patient is to follow up with Dr Sharlet Salina or her primary care provider if symptoms persist past this treatment course. Patient is given ED precautions to return to the ED for any worsening or new symptoms.    ____________________________________________  FINAL CLINICAL IMPRESSION(S) / ED DIAGNOSES  Final diagnoses:  Sciatica of right side  Lumbar strain, initial encounter      NEW MEDICATIONS STARTED DURING THIS VISIT:  New Prescriptions   DIAZEPAM (VALIUM) 2 MG TABLET    Take 1 tablet (2 mg total) by mouth every 8 (eight) hours as needed for muscle spasms.   NAPROXEN (NAPROSYN) 500 MG TABLET    Take 0.5 tablets (250 mg total) by mouth 2 (two) times daily with a meal.         Braxton Feathers, PA-C 02/16/16 1452    Rudene Re, MD 02/17/16  1202

## 2016-02-16 NOTE — ED Triage Notes (Signed)
Pt presents with low back pain started on Sunday, thinks she may have a pulled muscle in her lower back.

## 2016-02-28 DIAGNOSIS — M4806 Spinal stenosis, lumbar region: Secondary | ICD-10-CM | POA: Diagnosis not present

## 2016-02-28 DIAGNOSIS — M5136 Other intervertebral disc degeneration, lumbar region: Secondary | ICD-10-CM | POA: Diagnosis not present

## 2016-02-28 DIAGNOSIS — M5416 Radiculopathy, lumbar region: Secondary | ICD-10-CM | POA: Diagnosis not present

## 2016-03-08 ENCOUNTER — Other Ambulatory Visit: Payer: Self-pay | Admitting: Family Medicine

## 2016-03-08 DIAGNOSIS — E039 Hypothyroidism, unspecified: Secondary | ICD-10-CM

## 2016-03-09 NOTE — Telephone Encounter (Signed)
LOV with you 11/11/2015. Renaldo Fiddler, CMA

## 2016-03-10 DIAGNOSIS — M5136 Other intervertebral disc degeneration, lumbar region: Secondary | ICD-10-CM | POA: Diagnosis not present

## 2016-03-10 DIAGNOSIS — M4806 Spinal stenosis, lumbar region: Secondary | ICD-10-CM | POA: Diagnosis not present

## 2016-03-10 DIAGNOSIS — M5416 Radiculopathy, lumbar region: Secondary | ICD-10-CM | POA: Diagnosis not present

## 2016-03-15 DIAGNOSIS — J449 Chronic obstructive pulmonary disease, unspecified: Secondary | ICD-10-CM | POA: Diagnosis not present

## 2016-03-15 DIAGNOSIS — R0602 Shortness of breath: Secondary | ICD-10-CM | POA: Diagnosis not present

## 2016-03-17 DIAGNOSIS — H353211 Exudative age-related macular degeneration, right eye, with active choroidal neovascularization: Secondary | ICD-10-CM | POA: Diagnosis not present

## 2016-03-20 ENCOUNTER — Encounter: Payer: Self-pay | Admitting: Physician Assistant

## 2016-03-20 ENCOUNTER — Ambulatory Visit (INDEPENDENT_AMBULATORY_CARE_PROVIDER_SITE_OTHER): Payer: Medicare Other | Admitting: Physician Assistant

## 2016-03-20 VITALS — BP 120/60 | HR 60 | Temp 97.7°F | Resp 16 | Ht 69.0 in | Wt 141.0 lb

## 2016-03-20 DIAGNOSIS — I6523 Occlusion and stenosis of bilateral carotid arteries: Secondary | ICD-10-CM | POA: Diagnosis not present

## 2016-03-20 DIAGNOSIS — R1084 Generalized abdominal pain: Secondary | ICD-10-CM | POA: Diagnosis not present

## 2016-03-20 DIAGNOSIS — J90 Pleural effusion, not elsewhere classified: Secondary | ICD-10-CM

## 2016-03-20 DIAGNOSIS — M5136 Other intervertebral disc degeneration, lumbar region: Secondary | ICD-10-CM

## 2016-03-20 DIAGNOSIS — K5903 Drug induced constipation: Secondary | ICD-10-CM

## 2016-03-20 DIAGNOSIS — M5116 Intervertebral disc disorders with radiculopathy, lumbar region: Secondary | ICD-10-CM

## 2016-03-20 DIAGNOSIS — M51369 Other intervertebral disc degeneration, lumbar region without mention of lumbar back pain or lower extremity pain: Secondary | ICD-10-CM

## 2016-03-20 MED ORDER — LUBIPROSTONE 24 MCG PO CAPS: 24.0000 ug | ORAL_CAPSULE | Freq: Two times a day (BID) | ORAL | 1 refills | Status: DC

## 2016-03-20 NOTE — Progress Notes (Signed)
Patient: Chelsea [name] Female    DOB: 02/17/1924   82 y.o.   MRN: [id number] Visit Date: 03/20/2016  Today's Provider: Mar Daring, PA-C   Chief Complaint  Patient presents with  . Constipation  . Back Pain   Subjective:    Patient c/o constipation on and off times several months. Patient reports she take Miralax as needed. Patient reports that her stools are very watery but she still feel constipated and having sharp pain in her right lower abdominal area.   Constipation: Patient complains of constipation.  Stool pattern has been 5 watery stool(s) per day, no noticeable fecal matter included. Onset was several months ago. Co-Morbid conditions:none. Symptoms have been gradually worsening. Current Health Habits: Eating fiber? yes, amt one capful Miralax once a week, Exercise?no Water intake? yes, amt 4 glasses a day plus glass of tea or frappucino as well. Current OTC/RX therapy has been Miralax which has been not very effective. She does have past family history of intestinal cancer in her mother (was cause of death).  Back Pain: Patient presents for presents evaluation of low back problems.  Symptoms have been present for several weeks and include pain in right lower back (aching and shooting in character; 7/10 in severity). Initial inciting event: none. Symptoms are worst: all day and night. Alleviating factors identifiable by patient are none. Exacerbating factors identifiable by patient are sitting, standing and walking. Treatments so far initiated by patient: injections and hydrocodone Previous lower back problems: yes. Previous workup: yes. Previous treatments: injections. Most recent injection was 9/22 with Dr. Sharlet Salina. She reports mild relief 1st couple of days, but now not much. She is scheduled to see him on 04/18/16.  Patient will get flu vaccine through [facility].    Allergies  Allergen Reactions  . Sulfa Antibiotics     Other reaction(s): Dizziness  .  Sulfonamide Derivatives Other (See Comments)    Reaction:  Dizziness      Current Outpatient Prescriptions:  .  albuterol (PROVENTIL HFA;VENTOLIN HFA) 108 (90 Base) MCG/ACT inhaler, Inhale 2 puffs into the lungs every 6 (six) hours as needed for wheezing or shortness of breath., Disp: 1 Inhaler, Rfl: 2 .  amLODipine (NORVASC) 10 MG tablet, Take 1 tablet (10 mg total) by mouth daily., Disp: 90 tablet, Rfl: 3 .  ascorbic acid (VITAMIN C) 250 MG tablet, Take 1 tablet by mouth daily., Disp: , Rfl:  .  aspirin EC 81 MG tablet, Take 81 mg by mouth daily., Disp: , Rfl:  .  atorvastatin (LIPITOR) 80 MG tablet, Take 1 tablet (80 mg total) by mouth daily., Disp: 90 tablet, Rfl: 4 .  Calcium Carbonate-Vit D-Min (CALCIUM 1200 PO), Take by mouth daily., Disp: , Rfl:  .  clobetasol (TEMOVATE) 0.05 % external solution, apply to affected area twice a day AVOID FACE, GROIN, UNDERARM, Disp: , Rfl: 0 .  denosumab (PROLIA) 60 MG/ML SOLN injection, Inject 60 mg into the skin every 6 (six) months. Administer in upper arm, thigh, or abdomen, Disp: , Rfl:  .  fluticasone (FLONASE) 50 MCG/ACT nasal spray, Place 2 sprays into both nostrils daily., Disp: , Rfl:  .  fluticasone furoate-vilanterol (BREO ELLIPTA) 100-25 MCG/INH AEPB, Inhale 1 puff into the lungs daily., Disp: , Rfl:  .  hydrocortisone 2.5 % cream, apply 1 APPLICATION to affected area ON THE FACE daily, Disp: , Rfl: 0 .  levothyroxine (SYNTHROID, LEVOTHROID) 88 MCG tablet, take 1 tablet by mouth once daily,  Disp: 90 tablet, Rfl: 3 .  LORazepam (ATIVAN) 0.5 MG tablet, Take by mouth., Disp: , Rfl:  .  metaxalone (SKELAXIN) 800 MG tablet, Take 1 tablet (800 mg total) by mouth 3 (three) times daily as needed for muscle spasms., Disp: 15 tablet, Rfl: 0 .  Multiple Vitamins-Minerals (PRESERVISION AREDS PO), Take 1 capsule by mouth daily. , Disp: , Rfl:  .  naproxen (NAPROSYN) 500 MG tablet, Take 0.5 tablets (250 mg total) by mouth 2 (two) times daily with a meal.,  Disp: 21 tablet, Rfl: 0 .  nitroGLYCERIN (NITROSTAT) 0.4 MG SL tablet, Place 1 tablet (0.4 mg total) under the tongue every 5 (five) minutes as needed., Disp: 25 tablet, Rfl: 6 .  NON FORMULARY, Eye injection as directed Right eye., Disp: , Rfl:  .  omeprazole (PRILOSEC) 20 MG capsule, Take 20 mg by mouth daily., Disp: , Rfl:  .  oxybutynin (DITROPAN) 5 MG tablet, Take 5 mg by mouth at bedtime. Reported on 11/11/2015, Disp: , Rfl:  .  potassium chloride (K-DUR) 10 MEQ tablet, Take 10 mEq by mouth as needed. Reported on 11/11/2015, Disp: , Rfl:  .  sertraline (ZOLOFT) 100 MG tablet, TAKE 1 TABLET DAILY, Disp: 90 tablet, Rfl: 3 .  tiotropium (SPIRIVA HANDIHALER) 18 MCG inhalation capsule, Place 1 capsule (18 mcg total) into inhaler and inhale daily., Disp: 90 capsule, Rfl: 0 .  Vitamin D, Cholecalciferol, 1000 units CAPS, Take by mouth daily., Disp: , Rfl:   Review of Systems  Eyes: Positive for visual disturbance.  Respiratory: Negative.   Cardiovascular: Negative.   Gastrointestinal: Positive for abdominal pain, constipation, diarrhea and nausea.  Genitourinary: Negative.   Musculoskeletal: Positive for back pain.  Neurological: Positive for weakness and numbness. Negative for dizziness, syncope, light-headedness and headaches.  Psychiatric/Behavioral: Negative.     Social History  Substance Use Topics  . Smoking status: Former Research scientist (life sciences)  . Smokeless tobacco: Never Used     Comment: quit 50 years ago  . Alcohol use No   Objective:   BP 120/60 (BP Location: Right Arm, Patient Position: Sitting, Cuff Size: Large)   Pulse 60   Temp 97.7 F (36.5 C) (Oral)   Resp 16   Ht 5\' 9"  (1.753 m)   Wt 141 lb (64 kg)   BMI 20.82 kg/m   Physical Exam  Constitutional: She is oriented to person, place, and time. She appears well-developed and well-nourished. No distress.  Cardiovascular: Normal rate, regular rhythm and normal heart sounds.  Exam reveals no gallop and no friction rub.   No murmur  heard. Pulmonary/Chest: Effort normal and breath sounds normal. No respiratory distress. She has no wheezes. She has no rales.  Abdominal: Soft. Normal appearance and bowel sounds are normal. She exhibits distension. She exhibits no mass. There is no hepatosplenomegaly. There is generalized tenderness. There is no rebound, no guarding and no CVA tenderness.  Musculoskeletal: She exhibits no edema.  Neurological: She is alert and oriented to person, place, and time.  Skin: Skin is warm and dry. She is not diaphoretic.      Assessment & Plan:     1. Generalized abdominal pain Worsening abdominal pain that is not responding to OTC Miralax, even though she is only using once per week currently. Does have family history of intestinal cancer in her mother. Will get abdominal xray and f/u pending results. May consider referral back to Dr. Secundino Ginger, NP for further evaluation if xray is normal. - DG Abd 2 Views; Future  2. Drug-induced constipation Most likely cause of abdominal pain, but awaiting xray results. Most likely secondary to percocet use and dehydration.  I have given her 2 samples of Amitiza 66mcg to use if xray shows constipation. She is not to start this medication until she hears from me about xray results. She agrees. - lubiprostone (AMITIZA) 24 MCG capsule; Take 1 capsule (24 mcg total) by mouth 2 (two) times daily with a meal.  Dispense: 4 capsule; Refill: 1  3. DDD (degenerative disc disease), lumbar Uncontrolled pain. Using percocet occasionally with ASA. I do feel percocet use has worsened and is cause of constipation. She follows up with Dr. Sharlet Salina on 04/18/16.  4. Neuritis or radiculitis due to rupture of lumbar intervertebral disc See above.       Mar Daring, PA-C  [facility]

## 2016-03-20 NOTE — Patient Instructions (Signed)
Constipation, Adult Constipation is when a person has fewer than three bowel movements a week, has difficulty having a bowel movement, or has stools that are dry, hard, or larger than normal. As people grow older, constipation is more common. A low-fiber diet, not taking in enough fluids, and taking certain medicines may make constipation worse.  CAUSES   Certain medicines, such as antidepressants, pain medicine, iron supplements, antacids, and water pills.   Certain diseases, such as diabetes, irritable bowel syndrome (IBS), thyroid disease, or depression.   Not drinking enough water.   Not eating enough fiber-rich foods.   Stress or travel.   Lack of physical activity or exercise.   Ignoring the urge to have a bowel movement.   Using laxatives too much.  SIGNS AND SYMPTOMS   Having fewer than three bowel movements a week.   Straining to have a bowel movement.   Having stools that are hard, dry, or larger than normal.   Feeling full or bloated.   Pain in the lower abdomen.   Not feeling relief after having a bowel movement.  DIAGNOSIS  Your health care provider will take a medical history and perform a physical exam. Further testing may be done for severe constipation. Some tests may include:  A barium enema X-ray to examine your rectum, colon, and, sometimes, your small intestine.   A sigmoidoscopy to examine your lower colon.   A colonoscopy to examine your entire colon. TREATMENT  Treatment will depend on the severity of your constipation and what is causing it. Some dietary treatments include drinking more fluids and eating more fiber-rich foods. Lifestyle treatments may include regular exercise. If these diet and lifestyle recommendations do not help, your health care provider may recommend taking over-the-counter laxative medicines to help you have bowel movements. Prescription medicines may be prescribed if over-the-counter medicines do not work.   HOME CARE INSTRUCTIONS   Eat foods that have a lot of fiber, such as fruits, vegetables, whole grains, and beans.  Limit foods high in fat and processed sugars, such as french fries, hamburgers, cookies, candies, and soda.   A fiber supplement may be added to your diet if you cannot get enough fiber from foods.   Drink enough fluids to keep your urine clear or pale yellow.   Exercise regularly or as directed by your health care provider.   Go to the restroom when you have the urge to go. Do not hold it.   Only take over-the-counter or prescription medicines as directed by your health care provider. Do not take other medicines for constipation without talking to your health care provider first.  [place] IF:   You have bright red blood in your stool.   Your constipation lasts for more than 4 days or gets worse.   You have abdominal or rectal pain.   You have thin, pencil-like stools.   You have unexplained weight loss. MAKE SURE YOU:   Understand these instructions.  Will watch your condition.  Will get help right away if you are not doing well or get worse.   This information is not intended to replace advice given to you by your health care provider. Make sure you discuss any questions you have with your health care provider.   Document Released: 03/03/2004 Document Revised: 06/26/2014 Document Reviewed: 03/17/2013 Elsevier Interactive Patient Education 2016 [place] oral capsule What is this medicine? LUBIPROSTONE ([place] PROS tone) is a laxative. It is used to treat chronic  constipation and constipation caused by opioids (certain prescription pain medicines). It is also used to treat adult women with irritable bowel syndrome who have constipation. This medicine may be used for other purposes; ask your health care provider or pharmacist if you have questions. What should I tell my health care provider before I take this  medicine? They need to know if you have any of these conditions: -cancer or tumor in abdomen, intestine, or stomach -history of bowel obstruction or adhesions -history of stool (fecal) impaction -liver disease -an unusual or allergic reaction to lubiprostone, other medicines, foods, dyes, or preservatives -pregnant or trying to get pregnant -breast-feeding How should I use this medicine? Take this medicine by mouth with a glass of water. Follow the directions on the prescription label. Do not cut, crush or chew this medicine. Take this medicine with food. Take your medicine at regular intervals. Do not take your medicine more often than directed. Do not stop taking except on your doctor's advice. Talk to your pediatrician regarding the use of this medicine in children. Special care may be needed. Overdosage: If you think you have taken too much of this medicine contact a poison control center or emergency room at once. NOTE: This medicine is only for you. Do not share this medicine with others. What if I miss a dose? If you miss a dose, take it as soon as you can. If it is almost time for your next dose, take only that dose. Do not take double or extra doses. What may interact with this medicine? -medicines that treat diarrhea -methadone -other medicines for constipation This list may not describe all possible interactions. Give your health care provider a list of all the medicines, herbs, non-prescription drugs, or dietary supplements you use. Also tell them if you smoke, drink alcohol, or use illegal drugs. Some items may interact with your medicine. What should I watch for while using this medicine? Visit your doctor for regular check ups. Tell your doctor if your symptoms do not get better or if they get worse. What side effects may I notice from receiving this medicine? Side effects that you should report to your doctor or health care professional as soon as possible: -allergic  reactions like skin rash, itching or hives, swelling of the face, lips, or tongue -new or worsening stomach pain -severe or prolonged diarrhea Side effects that usually do not require medical attention (report to your prescriber or health care professional if they continue or are bothersome): -headache -loose stools -nausea This list may not describe all possible side effects. Call your doctor for medical advice about side effects. You may report side effects to FDA at 1-800-FDA-1088. Where should I keep my medicine? Keep out of the reach of children. Store at room temperature between 15 and 30 degrees C (59 and 86 degrees F). Throw away any unused medicine after the expiration date. NOTE: This sheet is a summary. It may not cover all possible information. If you have questions about this medicine, talk to your doctor, pharmacist, or health care provider.    2016, Elsevier/Gold Standard. (2011-10-13 09:24:31)

## 2016-03-21 ENCOUNTER — Ambulatory Visit
Admission: RE | Admit: 2016-03-21 | Discharge: 2016-03-21 | Disposition: A | Discharge status: Home / Self Care | Payer: Medicare Other | Source: Ambulatory Visit | Attending: Physician Assistant | Admitting: Physician Assistant

## 2016-03-21 DIAGNOSIS — M4186 Other forms of scoliosis, lumbar region: Secondary | ICD-10-CM | POA: Diagnosis not present

## 2016-03-21 DIAGNOSIS — R1084 Generalized abdominal pain: Secondary | ICD-10-CM | POA: Insufficient documentation

## 2016-03-21 DIAGNOSIS — I708 Atherosclerosis of other arteries: Secondary | ICD-10-CM | POA: Diagnosis not present

## 2016-03-21 DIAGNOSIS — M47896 Other spondylosis, lumbar region: Secondary | ICD-10-CM | POA: Diagnosis not present

## 2016-03-21 DIAGNOSIS — Z96641 Presence of right artificial hip joint: Secondary | ICD-10-CM | POA: Diagnosis not present

## 2016-03-21 DIAGNOSIS — I700 Atherosclerosis of aorta: Secondary | ICD-10-CM | POA: Insufficient documentation

## 2016-03-21 DIAGNOSIS — R109 Unspecified abdominal pain: Secondary | ICD-10-CM | POA: Diagnosis present

## 2016-03-21 DIAGNOSIS — K5900 Constipation, unspecified: Secondary | ICD-10-CM | POA: Diagnosis not present

## 2016-03-21 NOTE — Addendum Note (Signed)
Addended by: Mar Daring on: 03/21/2016 01:06 PM   Modules accepted: Orders

## 2016-03-23 ENCOUNTER — Telehealth: Payer: Self-pay

## 2016-03-23 NOTE — Telephone Encounter (Signed)
Raquel Sarna spoke with patient and advised as directed below. Per patient the [place] Pierini is working well. She doesn't want prescription to be call in yet because she still has some amitiza left. She reports she is going to go get the xray on Wednesday.  Thanks,  -Fatima Fedie

## 2016-03-23 NOTE — Telephone Encounter (Signed)
Perfect. Please advise her to call if she develops SOB. Thanks

## 2016-03-23 NOTE — Telephone Encounter (Signed)
-----   Message from Mar Daring, [place] sent at 03/21/2016  1:05 PM EDT ----- There is stool noted throughout the colon, otherwise unremarkable. Recommend starting the amitiza she was given yesterday. She will take it twice daily.  BM may occur within 45 minutes of taking medication so just be aware. Let me know if it works and I can call in Rx for it.  Also of note there is an area in the right lower lung that may represent a pleural effusion. Recommend CXR to further evaluate.

## 2016-03-23 NOTE — Telephone Encounter (Signed)
Done.  Thanks,  -Alexiana Laverdure

## 2016-03-24 ENCOUNTER — Telehealth: Payer: Self-pay | Admitting: Physician Assistant

## 2016-03-24 NOTE — Telephone Encounter (Signed)
She could give amitiza more time, like another week or so to see if it will start getting her more regular, or we could try linzess? Linzess has to be stored in a dry, room temperature area, no bathrooms, no direct sunlight. And it comes with a moisture absorber in the bottle that cannot be taken out or the medicine will become ineffective.

## 2016-03-24 NOTE — Telephone Encounter (Signed)
Pt stated that she has taken lubiprostone (AMITIZA) 24 MCG capsule as directed and the 1st, 3rd, 4th, & 5th  doses didn't work but the 2 nd dose did. Pt request a call back to advise what she should try. Please advise. Thanks TNP

## 2016-03-24 NOTE — Telephone Encounter (Signed)
Pt called saying she is now getting some relief from the laxative.  Just wanted to let you know.  You dont have to call her back.  Than s teri

## 2016-03-24 NOTE — Telephone Encounter (Signed)
Please advise.  Thanks,  -Joseline 

## 2016-03-27 ENCOUNTER — Telehealth: Payer: Self-pay | Admitting: Physician Assistant

## 2016-03-27 DIAGNOSIS — K5903 Drug induced constipation: Secondary | ICD-10-CM

## 2016-03-27 MED ORDER — LINACLOTIDE 72 MCG PO CAPS: 72.0000 ug | ORAL_CAPSULE | Freq: Every day | ORAL | 1 refills | Status: DC

## 2016-03-27 NOTE — Telephone Encounter (Signed)
Patient has been advised. KW 

## 2016-03-27 NOTE — Telephone Encounter (Signed)
Please review.  Thanks,  -Joseline 

## 2016-03-27 NOTE — Telephone Encounter (Signed)
Will send in [place] for her to try. I sent lowest dose. Take once daily, every day. If no success with this medication can increase to next highest dose of this medication. If she continues to have constipation and abdominal pain with higher dose linzess may refer her to GI due to family history.   Also make sure patient is aware linzess cannot be stored in the bathroom or any other area that is susceptible to moisture build up because it will become ineffective.

## 2016-03-27 NOTE — Telephone Encounter (Signed)
Pt states she called last week and rec'd a Rx for constipation.  Pt states she is still having constipation.  Pt states she is requesting something to help with this.  [place].  [phone]

## 2016-03-27 NOTE — Telephone Encounter (Signed)
See if patient wants Rx for amitiza sent in since it has worked. She needs to take this medication twice daily everyday.

## 2016-03-27 NOTE — Telephone Encounter (Signed)
Spoke with patient and she states Amitza did not work for her, she said after taking medication after second time it was not effective. KW

## 2016-03-28 ENCOUNTER — Telehealth: Payer: Self-pay | Admitting: Physician Assistant

## 2016-03-28 NOTE — Telephone Encounter (Signed)
fyi-aa 

## 2016-03-28 NOTE — Telephone Encounter (Signed)
Pt called saying she is not going to be able to get the CXR done tomorrow that you had ordered.    She is having taking the laxative and is not sure when she is going to be able to get out of the house depending on how it works.  Her call back is [phone]    Thank sTeri

## 2016-03-29 NOTE — Telephone Encounter (Signed)
Thank you :)

## 2016-03-29 NOTE — Telephone Encounter (Signed)
That is ok but let patient know that there may have been possible effusion noted on the abdominal xray and this is why we are trying to see what it was they saw. Please make sure patient is still not having any SOB or cough.

## 2016-03-29 NOTE — Telephone Encounter (Signed)
Spoke with patient, she wanted to understand what effusion meant I explained to her. She does have SOB but states she always has that and it is not any worse from her normal. She will try to get Xray done the first of next week-aa

## 2016-04-04 ENCOUNTER — Ambulatory Visit
Admission: RE | Admit: 2016-04-04 | Discharge: 2016-04-04 | Disposition: A | Discharge status: Home / Self Care | Payer: Medicare Other | Source: Ambulatory Visit | Attending: Physician Assistant | Admitting: Physician Assistant

## 2016-04-04 DIAGNOSIS — J90 Pleural effusion, not elsewhere classified: Secondary | ICD-10-CM

## 2016-04-04 DIAGNOSIS — R0602 Shortness of breath: Secondary | ICD-10-CM | POA: Diagnosis not present

## 2016-04-07 ENCOUNTER — Ambulatory Visit
Admission: RE | Admit: 2016-04-07 | Discharge: 2016-04-07 | Disposition: A | Discharge status: Home / Self Care | Payer: Medicare Other | Source: Ambulatory Visit | Attending: Physician Assistant | Admitting: Physician Assistant

## 2016-04-07 ENCOUNTER — Encounter: Payer: Self-pay | Admitting: Physician Assistant

## 2016-04-07 ENCOUNTER — Ambulatory Visit (INDEPENDENT_AMBULATORY_CARE_PROVIDER_SITE_OTHER): Payer: Medicare Other | Admitting: Physician Assistant

## 2016-04-07 VITALS — BP 120/70 | HR 71 | Temp 97.8°F | Resp 16 | Wt 140.8 lb

## 2016-04-07 DIAGNOSIS — I70201 Unspecified atherosclerosis of native arteries of extremities, right leg: Secondary | ICD-10-CM | POA: Diagnosis not present

## 2016-04-07 DIAGNOSIS — M25551 Pain in right hip: Secondary | ICD-10-CM | POA: Insufficient documentation

## 2016-04-07 DIAGNOSIS — I6523 Occlusion and stenosis of bilateral carotid arteries: Secondary | ICD-10-CM | POA: Diagnosis not present

## 2016-04-07 NOTE — Patient Instructions (Signed)
Spinal Stenosis Spinal stenosis is an abnormal narrowing of the canals of your spine (vertebrae). CAUSES  Spinal stenosis is caused by areas of bone pushing into the central canals of your vertebrae. This condition can be present at birth (congenital). It also may be caused by arthritic deterioration of your vertebrae (spinal degeneration).  SYMPTOMS   Pain that is generally worse with activities, particularly standing and walking.  Numbness, tingling, hot or cold sensations, weakness, or weariness in your legs.  Frequent episodes of falling.  A foot-slapping gait that leads to muscle weakness. DIAGNOSIS  Spinal stenosis is diagnosed with the use of magnetic resonance imaging (MRI) or computed tomography (CT). TREATMENT  Initial therapy for spinal stenosis focuses on the management of the pain and other symptoms associated with the condition. These therapies include:  Practicing postural changes to lessen pressure on your nerves.  Exercises to strengthen the core of your body.  Loss of excess body weight.  The use of nonsteroidal anti-inflammatory medicines to reduce swelling and inflammation in your nerves. When therapies to manage pain are not successful, surgery to treat spinal stenosis may be recommended. This surgery involves removing excess bone, which puts pressure on your nerve roots. During this surgery (laminectomy), the posterior boney arch (lamina) and excess bone around the facet joints are removed.   This information is not intended to replace advice given to you by your health care provider. Make sure you discuss any questions you have with your health care provider.   Document Released: 08/26/2003 Document Revised: 06/26/2014 Document Reviewed: 09/13/2012 Elsevier Interactive Patient Education 2016 Elsevier Inc.  

## 2016-04-07 NOTE — Progress Notes (Signed)
Patient: Chelsea [name] Female    DOB: Oct 25, 1923   82 y.o.   MRN: [id number] Visit Date: 04/07/2016  Today's Provider: Mar Daring, PA-C   Chief Complaint  Patient presents with  . Hip Pain   Subjective:    HPI Patient is here with c/o right side pain leg and hip hurting with a sharp pain in right groin deep in hip joint. She also complaining of her back hurting but she is scheduled to go see a chiropractor this coming Tuesday. Denies chest pain, leg swelling or palpitations.   She does have known DDD with scoliosis in the lumbar spine with radiculopathy down the right leg. She is followed by Dr. Sharlet Salina for this and recently had an epidural injection on 03/10/16.  She also has had 2 arthroplasties on the right hip and is followed by Dr. Marry Guan.   She does have pronounced OA with spontaneous vertebral compression fractures. She is followed by endocrine for injections of Prolia.  She also needs her application for disability parking placard to be filled out.    Allergies  Allergen Reactions  . Sulfa Antibiotics     Other reaction(s): Dizziness  . Sulfonamide Derivatives Other (See Comments)    Reaction:  Dizziness      Current Outpatient Prescriptions:  .  albuterol (PROVENTIL HFA;VENTOLIN HFA) 108 (90 Base) MCG/ACT inhaler, Inhale 2 puffs into the lungs every 6 (six) hours as needed for wheezing or shortness of breath., Disp: 1 Inhaler, Rfl: 2 .  amLODipine (NORVASC) 10 MG tablet, Take 1 tablet (10 mg total) by mouth daily., Disp: 90 tablet, Rfl: 3 .  ascorbic acid (VITAMIN C) 250 MG tablet, Take 1 tablet by mouth daily., Disp: , Rfl:  .  aspirin EC 81 MG tablet, Take 81 mg by mouth at bedtime. 325 mg during the day for pain as needed, Disp: , Rfl:  .  atorvastatin (LIPITOR) 80 MG tablet, Take 1 tablet (80 mg total) by mouth daily., Disp: 90 tablet, Rfl: 4 .  Calcium Carbonate-Vit D-Min (CALCIUM 1200 PO), Take by mouth daily., Disp: , Rfl:  .  clobetasol  (TEMOVATE) 0.05 % external solution, apply to affected area twice a day AVOID FACE, GROIN, UNDERARM, Disp: , Rfl: 0 .  denosumab (PROLIA) 60 MG/ML SOLN injection, Inject 60 mg into the skin every 6 (six) months. Administer in upper arm, thigh, or abdomen, Disp: , Rfl:  .  fluticasone (FLONASE) 50 MCG/ACT nasal spray, Place 2 sprays into both nostrils daily., Disp: , Rfl:  .  fluticasone furoate-vilanterol (BREO ELLIPTA) 100-25 MCG/INH AEPB, Inhale 1 puff into the lungs daily., Disp: , Rfl:  .  levothyroxine (SYNTHROID, LEVOTHROID) 88 MCG tablet, take 1 tablet by mouth once daily, Disp: 90 tablet, Rfl: 3 .  linaclotide (LINZESS) 72 MCG capsule, Take 1 capsule (72 mcg total) by mouth daily before breakfast., Disp: 30 capsule, Rfl: 1 .  lubiprostone (AMITIZA) 24 MCG capsule, Take 1 capsule (24 mcg total) by mouth 2 (two) times daily with a meal., Disp: 4 capsule, Rfl: 1 .  metaxalone (SKELAXIN) 800 MG tablet, Take 1 tablet (800 mg total) by mouth 3 (three) times daily as needed for muscle spasms., Disp: 15 tablet, Rfl: 0 .  Multiple Vitamins-Minerals (PRESERVISION AREDS PO), Take 1 capsule by mouth daily. , Disp: , Rfl:  .  nitroGLYCERIN (NITROSTAT) 0.4 MG SL tablet, Place 1 tablet (0.4 mg total) under the tongue every 5 (five) minutes as needed., Disp: 25  tablet, Rfl: 6 .  omeprazole (PRILOSEC) 20 MG capsule, Take 20 mg by mouth daily., Disp: , Rfl:  .  oxyCODONE-acetaminophen (PERCOCET) 10-325 MG tablet, Take 1 tablet by mouth every 8 (eight) hours as needed for pain., Disp: , Rfl:  .  sertraline (ZOLOFT) 100 MG tablet, TAKE 1 TABLET DAILY, Disp: 90 tablet, Rfl: 3 .  tiotropium (SPIRIVA HANDIHALER) 18 MCG inhalation capsule, Place 1 capsule (18 mcg total) into inhaler and inhale daily., Disp: 90 capsule, Rfl: 0 .  Vitamin D, Cholecalciferol, 1000 units CAPS, Take by mouth daily., Disp: , Rfl:  .  NON FORMULARY, Eye injection as directed Right eye., Disp: , Rfl:   Review of Systems  Respiratory:  Negative.   Cardiovascular: Negative.   Gastrointestinal: Negative.   Genitourinary: Negative.   Musculoskeletal: Positive for arthralgias, back pain and gait problem. Negative for joint swelling, myalgias, neck pain and neck stiffness.  Skin: Negative.   Neurological: Positive for weakness. Negative for numbness.  Psychiatric/Behavioral: Negative.     Social History  Substance Use Topics  . Smoking status: Former Research scientist (life sciences)  . Smokeless tobacco: Never Used     Comment: quit 50 years ago  . Alcohol use No   Objective:   BP 120/70 (BP Location: Right Arm, Patient Position: Sitting, Cuff Size: Normal)   Pulse 71   Temp 97.8 F (36.6 C) (Oral)   Resp 16   Wt 140 lb 12.8 oz (63.9 kg)   BMI 20.79 kg/m   Physical Exam  Constitutional: She appears well-developed and well-nourished. No distress.  Neck: Normal range of motion. Neck supple.  Cardiovascular: Normal rate, regular rhythm and normal heart sounds.  Exam reveals no gallop and no friction rub.   No murmur heard. Pulmonary/Chest: Effort normal and breath sounds normal. No respiratory distress. She has no wheezes. She has no rales.  Musculoskeletal:       Right hip: She exhibits decreased range of motion, decreased strength and tenderness.       Left hip: She exhibits decreased range of motion. She exhibits normal strength and no tenderness.       Lumbar back: She exhibits decreased range of motion, tenderness and bony tenderness. She exhibits no spasm.  Skin: She is not diaphoretic.  Vitals reviewed.      Assessment & Plan:     1. Right hip pain Being that she has had 2 surgeries on the right hip and has known OA we will image the right hip to make sure the bone and hardware are all intact. Discussed that this could be worsening radiculopathy from her progressing DDD and scoliosis. She voiced understanding. She is going to see a chiropractor on Tuesday and is scheduled to f/u with Dr. Sharlet Salina on 04/18/16. If x-ray is ok I  advised her to let Dr. Sharlet Salina know her symptoms are progressing. She is to call if symptoms worsen before she sees Dr. Sharlet Salina on 04/18/16. - DG HIP UNILAT WITH PELVIS MIN 4 VIEWS RIGHT; Future   Addend: CLINICAL DATA:  2 right hip arthroplasties (most recent in 2015) with sharp right hip pain (deep groin) that radiates down right leg to knee x a couple of months per tp. Pt denies any recent injury.  EXAM: DG HIP (WITH OR WITHOUT PELVIS) 4+V RIGHT  COMPARISON:  Plain film of the pelvis and right hip dated 11/11/2015.  FINDINGS: Total right hip arthroplasty hardware appears intact and stable in alignment. No evidence of loosening or other surgical complicating feature identified. Osseous  structures of the pelvis appear intact and stable in alignment. No osseous fracture line or displaced fracture fragment seen. No acute or suspicious osseous finding.  Degenerative changes again noted within the scoliotic lower lumbar spine, at least moderate in degree. Stable vascular calcifications noted in the pelvis and lower abdomen. Additional atherosclerotic calcifications noted in the right thigh. Soft tissues about the pelvis and right hip are otherwise unremarkable.  IMPRESSION: 1. No acute findings. 2. Right hip arthroplasty hardware appears intact and remains anatomic in alignment. No evidence of complicating feature. 3. Atherosclerosis. 4. Degenerative changes of the scoliotic lower lumbar spine appear stable.   Electronically Signed   By: Franki Cabot M.D.   On: 04/07/2016 16:06      Mar Daring, PA-C  [facility]

## 2016-04-10 ENCOUNTER — Telehealth: Payer: Self-pay

## 2016-04-10 NOTE — Telephone Encounter (Signed)
Patient advised as below. Patient verbalizes understanding and is in agreement with treatment plan.  

## 2016-04-10 NOTE — Telephone Encounter (Signed)
-----   Message from Mar Daring, [place] sent at 04/07/2016  4:23 PM EDT ----- Right hip is stable and all hardware has no changes. Seems to be from lumbar spine (low back), there is noted degenerative changes and scoliosis. Please discuss with Dr. Sharlet Salina.

## 2016-04-11 ENCOUNTER — Telehealth: Payer: Self-pay

## 2016-04-11 NOTE — Telephone Encounter (Signed)
Called pt to schedule AWV, she had questions regarding booster shots for Pnuemonia concerned with getting the appropriate shot. - Pt needs return call from nurse  knb    Checked patients vaccine record: [id number] Td-07/26/2007 Zooster-07/27/2006  Patient probably needs Prevnar13. Per Tawanna Sat call [facility] and see what vaccine they have administered to patient.  Called [phone] and left a message.  Thanks,  -Vinessa Macconnell

## 2016-04-11 NOTE — Telephone Encounter (Signed)
Per Caryl Pina at [facility] reports that per there record patient reported  That she received pneumonia vaccine in 2012 with Dr. Venia Minks. Per Caryl Pina they do not give Pneumonia's vaccines.  Thanks,  -Joseline.  Please Advise.

## 2016-04-11 NOTE — Telephone Encounter (Signed)
See if we can track down which vaccine was given in 2012 because this could have been prevnar. If we cannot find any record anywhere then she will need prevnar and high dose flu.

## 2016-04-12 ENCOUNTER — Ambulatory Visit: Payer: Medicare Other | Admitting: Podiatry

## 2016-04-12 DIAGNOSIS — M5136 Other intervertebral disc degeneration, lumbar region: Secondary | ICD-10-CM | POA: Diagnosis not present

## 2016-04-12 DIAGNOSIS — M9903 Segmental and somatic dysfunction of lumbar region: Secondary | ICD-10-CM | POA: Diagnosis not present

## 2016-04-12 DIAGNOSIS — M9905 Segmental and somatic dysfunction of pelvic region: Secondary | ICD-10-CM | POA: Diagnosis not present

## 2016-04-12 DIAGNOSIS — M5431 Sciatica, right side: Secondary | ICD-10-CM | POA: Diagnosis not present

## 2016-04-13 DIAGNOSIS — M5136 Other intervertebral disc degeneration, lumbar region: Secondary | ICD-10-CM | POA: Diagnosis not present

## 2016-04-13 DIAGNOSIS — M5431 Sciatica, right side: Secondary | ICD-10-CM | POA: Diagnosis not present

## 2016-04-13 DIAGNOSIS — M9905 Segmental and somatic dysfunction of pelvic region: Secondary | ICD-10-CM | POA: Diagnosis not present

## 2016-04-13 DIAGNOSIS — Z23 Encounter for immunization: Secondary | ICD-10-CM | POA: Diagnosis not present

## 2016-04-13 DIAGNOSIS — M9903 Segmental and somatic dysfunction of lumbar region: Secondary | ICD-10-CM | POA: Diagnosis not present

## 2016-04-14 DIAGNOSIS — M5431 Sciatica, right side: Secondary | ICD-10-CM | POA: Diagnosis not present

## 2016-04-14 DIAGNOSIS — M9903 Segmental and somatic dysfunction of lumbar region: Secondary | ICD-10-CM | POA: Diagnosis not present

## 2016-04-14 DIAGNOSIS — M5136 Other intervertebral disc degeneration, lumbar region: Secondary | ICD-10-CM | POA: Diagnosis not present

## 2016-04-14 DIAGNOSIS — M9905 Segmental and somatic dysfunction of pelvic region: Secondary | ICD-10-CM | POA: Diagnosis not present

## 2016-04-17 DIAGNOSIS — M9903 Segmental and somatic dysfunction of lumbar region: Secondary | ICD-10-CM | POA: Diagnosis not present

## 2016-04-17 DIAGNOSIS — M5431 Sciatica, right side: Secondary | ICD-10-CM | POA: Diagnosis not present

## 2016-04-17 DIAGNOSIS — M5136 Other intervertebral disc degeneration, lumbar region: Secondary | ICD-10-CM | POA: Diagnosis not present

## 2016-04-17 DIAGNOSIS — M9905 Segmental and somatic dysfunction of pelvic region: Secondary | ICD-10-CM | POA: Diagnosis not present

## 2016-04-19 DIAGNOSIS — M5431 Sciatica, right side: Secondary | ICD-10-CM | POA: Diagnosis not present

## 2016-04-19 DIAGNOSIS — M5136 Other intervertebral disc degeneration, lumbar region: Secondary | ICD-10-CM | POA: Diagnosis not present

## 2016-04-19 DIAGNOSIS — M9903 Segmental and somatic dysfunction of lumbar region: Secondary | ICD-10-CM | POA: Diagnosis not present

## 2016-04-19 DIAGNOSIS — M9905 Segmental and somatic dysfunction of pelvic region: Secondary | ICD-10-CM | POA: Diagnosis not present

## 2016-04-21 DIAGNOSIS — M9903 Segmental and somatic dysfunction of lumbar region: Secondary | ICD-10-CM | POA: Diagnosis not present

## 2016-04-21 DIAGNOSIS — M9905 Segmental and somatic dysfunction of pelvic region: Secondary | ICD-10-CM | POA: Diagnosis not present

## 2016-04-21 DIAGNOSIS — M5136 Other intervertebral disc degeneration, lumbar region: Secondary | ICD-10-CM | POA: Diagnosis not present

## 2016-04-21 DIAGNOSIS — M5431 Sciatica, right side: Secondary | ICD-10-CM | POA: Diagnosis not present

## 2016-04-24 DIAGNOSIS — M5431 Sciatica, right side: Secondary | ICD-10-CM | POA: Diagnosis not present

## 2016-04-24 DIAGNOSIS — M9905 Segmental and somatic dysfunction of pelvic region: Secondary | ICD-10-CM | POA: Diagnosis not present

## 2016-04-24 DIAGNOSIS — M9903 Segmental and somatic dysfunction of lumbar region: Secondary | ICD-10-CM | POA: Diagnosis not present

## 2016-04-24 DIAGNOSIS — M5136 Other intervertebral disc degeneration, lumbar region: Secondary | ICD-10-CM | POA: Diagnosis not present

## 2016-04-26 ENCOUNTER — Encounter: Payer: Self-pay | Admitting: Podiatry

## 2016-04-26 ENCOUNTER — Ambulatory Visit (INDEPENDENT_AMBULATORY_CARE_PROVIDER_SITE_OTHER): Payer: Medicare Other | Admitting: Podiatry

## 2016-04-26 DIAGNOSIS — Q828 Other specified congenital malformations of skin: Secondary | ICD-10-CM

## 2016-04-26 DIAGNOSIS — M5136 Other intervertebral disc degeneration, lumbar region: Secondary | ICD-10-CM | POA: Diagnosis not present

## 2016-04-26 DIAGNOSIS — B351 Tinea unguium: Secondary | ICD-10-CM

## 2016-04-26 DIAGNOSIS — M79676 Pain in unspecified toe(s): Secondary | ICD-10-CM

## 2016-04-26 DIAGNOSIS — M9905 Segmental and somatic dysfunction of pelvic region: Secondary | ICD-10-CM | POA: Diagnosis not present

## 2016-04-26 DIAGNOSIS — M9903 Segmental and somatic dysfunction of lumbar region: Secondary | ICD-10-CM | POA: Diagnosis not present

## 2016-04-26 DIAGNOSIS — M5431 Sciatica, right side: Secondary | ICD-10-CM | POA: Diagnosis not present

## 2016-04-28 DIAGNOSIS — M9905 Segmental and somatic dysfunction of pelvic region: Secondary | ICD-10-CM | POA: Diagnosis not present

## 2016-04-28 DIAGNOSIS — M5136 Other intervertebral disc degeneration, lumbar region: Secondary | ICD-10-CM | POA: Diagnosis not present

## 2016-04-28 DIAGNOSIS — M9903 Segmental and somatic dysfunction of lumbar region: Secondary | ICD-10-CM | POA: Diagnosis not present

## 2016-04-28 DIAGNOSIS — M5431 Sciatica, right side: Secondary | ICD-10-CM | POA: Diagnosis not present

## 2016-04-29 NOTE — Progress Notes (Signed)
She presents today with chief complaint of painful elongated toenails.  Objective: Pulses are strongly palpable. Neurologic sensorium is intact. Deep tendon reflexes are intact. Toenails are thick yellow dystrophic onychomycotic and painful on palpation.  Assessment: Pain in limb secondary to onychomycosis and elongated nails.  Plan: Debridement of toenails 1 through 5 bilateral.

## 2016-05-01 DIAGNOSIS — M9905 Segmental and somatic dysfunction of pelvic region: Secondary | ICD-10-CM | POA: Diagnosis not present

## 2016-05-01 DIAGNOSIS — M5431 Sciatica, right side: Secondary | ICD-10-CM | POA: Diagnosis not present

## 2016-05-01 DIAGNOSIS — M5136 Other intervertebral disc degeneration, lumbar region: Secondary | ICD-10-CM | POA: Diagnosis not present

## 2016-05-01 DIAGNOSIS — M9903 Segmental and somatic dysfunction of lumbar region: Secondary | ICD-10-CM | POA: Diagnosis not present

## 2016-05-02 ENCOUNTER — Encounter: Payer: Self-pay | Admitting: Physician Assistant

## 2016-05-02 ENCOUNTER — Ambulatory Visit (INDEPENDENT_AMBULATORY_CARE_PROVIDER_SITE_OTHER): Payer: Medicare Other | Admitting: Physician Assistant

## 2016-05-02 VITALS — BP 126/78 | HR 88 | Temp 98.5°F | Resp 16 | Wt 139.0 lb

## 2016-05-02 DIAGNOSIS — K5900 Constipation, unspecified: Secondary | ICD-10-CM | POA: Diagnosis not present

## 2016-05-02 DIAGNOSIS — M5416 Radiculopathy, lumbar region: Secondary | ICD-10-CM | POA: Diagnosis not present

## 2016-05-02 DIAGNOSIS — I6523 Occlusion and stenosis of bilateral carotid arteries: Secondary | ICD-10-CM | POA: Diagnosis not present

## 2016-05-02 DIAGNOSIS — Z98818 Other dental procedure status: Secondary | ICD-10-CM | POA: Diagnosis not present

## 2016-05-02 DIAGNOSIS — M48062 Spinal stenosis, lumbar region with neurogenic claudication: Secondary | ICD-10-CM | POA: Diagnosis not present

## 2016-05-02 MED ORDER — AMOXICILLIN 500 MG PO CAPS: ORAL_CAPSULE | ORAL | 1 refills | Status: DC

## 2016-05-02 MED ORDER — NALOXEGOL OXALATE 12.5 MG PO TABS: 12.5000 mg | ORAL_TABLET | Freq: Every day | ORAL | 0 refills | Status: AC

## 2016-05-02 NOTE — Patient Instructions (Signed)
Constipation, Adult °Constipation is when a person: °· Poops (has a bowel movement) fewer times in a week than normal. °· Has a hard time pooping. °· Has poop that is dry, hard, or bigger than normal. ° °Follow these instructions at home: °Eating and drinking ° °· Eat foods that have a lot of fiber, such as: °? Fresh fruits and vegetables. °? Whole grains. °? Beans. °· Eat less of foods that are high in fat, low in fiber, or overly processed, such as: °? French fries. °? Hamburgers. °? Cookies. °? Candy. °? Soda. °· Drink enough fluid to keep your pee (urine) clear or pale yellow. °General instructions °· Exercise regularly or as told by your doctor. °· Go to the restroom when you feel like you need to poop. Do not hold it in. °· Take over-the-counter and prescription medicines only as told by your doctor. These include any fiber supplements. °· Do pelvic floor retraining exercises, such as: °? Doing deep breathing while relaxing your lower belly (abdomen). °? Relaxing your pelvic floor while pooping. °· Watch your condition for any changes. °· Keep all follow-up visits as told by your doctor. This is important. °Contact a doctor if: °· You have pain that gets worse. °· You have a fever. °· You have not pooped for 4 days. °· You throw up (vomit). °· You are not hungry. °· You lose weight. °· You are bleeding from the anus. °· You have thin, pencil-like poop (stool). °Get help right away if: °· You have a fever, and your symptoms suddenly get worse. °· You leak poop or have blood in your poop. °· Your belly feels hard or bigger than normal (is bloated). °· You have very bad belly pain. °· You feel dizzy or you faint. °This information is not intended to replace advice given to you by your health care provider. Make sure you discuss any questions you have with your health care provider. °Document Released: 11/22/2007 Document Revised: 12/24/2015 Document Reviewed: 11/24/2015 °Elsevier Interactive Patient Education ©  2017 Elsevier Inc. ° °

## 2016-05-02 NOTE — Progress Notes (Signed)
Patient: Chelsea [name] Female    DOB: June 07, 1924   82 y.o.   MRN: [id number] Visit Date: 05/02/2016  Today's Provider: Trinna Post, PA-C   Chief Complaint  Patient presents with  . Abdominal Pain    Has been going on for several months.    Subjective:    Abdominal Pain  This is a chronic problem. The pain is located in the RLQ and generalized abdominal region. The quality of the pain is sharp and aching. Associated symptoms include constipation and diarrhea (Pt reports having diarrhea Saturday night after taking an otc laxative.). Pertinent negatives include no arthralgias, fever, headaches, myalgias, nausea or vomiting. Her past medical history is significant for abdominal surgery.   Patient is a pleasant 82 year old female with history of longstanding constipation on chronic opioid therapy for hip pain and history of bypass surgery presenting today for abdominal pain. She was seen for this on 03/21/2016 and had a negative abdominal xray. She was prescribed Linzess, which she said worked initially but has since stopped working. Last Saturday, she took a laxative and had diarrhea after that. She reports this RLQ abdominal pain has been present for around two months. It happens once or twice a day when she leans forward, but not every time she leans forward. She remains constipated and states she cannot have bowel movement without a laxative. There is no blood in her stool. She is not having trouble urinating. She reports eating and drinking okay. She has had a hysterosalpingectomy, appendectomy, and cholecystectomy. No nausea or vomiting. No injuries to this area. Does have history of hip arthroplasty and pain in right hip. No history of hernias. Has seen Dr. Vira Agar in the past for GI issues.   Patient requesting amoxicillin for upcoming dental procedure 2/2 history of bypass surgery. No problems with amoxicillin before.     Allergies  Allergen Reactions  . Sulfa Antibiotics      Other reaction(s): Dizziness  . Sulfonamide Derivatives Other (See Comments)    Reaction:  Dizziness      Current Outpatient Prescriptions:  .  linaclotide (LINZESS) 72 MCG capsule, Take 1 capsule (72 mcg total) by mouth daily before breakfast., Disp: 30 capsule, Rfl: 1 .  albuterol (PROVENTIL HFA;VENTOLIN HFA) 108 (90 Base) MCG/ACT inhaler, Inhale 2 puffs into the lungs every 6 (six) hours as needed for wheezing or shortness of breath., Disp: 1 Inhaler, Rfl: 2 .  amLODipine (NORVASC) 10 MG tablet, Take 1 tablet (10 mg total) by mouth daily., Disp: 90 tablet, Rfl: 3 .  ascorbic acid (VITAMIN C) 250 MG tablet, Take 1 tablet by mouth daily., Disp: , Rfl:  .  aspirin EC 81 MG tablet, Take 81 mg by mouth at bedtime. 325 mg during the day for pain as needed, Disp: , Rfl:  .  atorvastatin (LIPITOR) 80 MG tablet, Take 1 tablet (80 mg total) by mouth daily., Disp: 90 tablet, Rfl: 4 .  Calcium Carbonate-Vit D-Min (CALCIUM 1200 PO), Take by mouth daily., Disp: , Rfl:  .  clobetasol (TEMOVATE) 0.05 % external solution, apply to affected area twice a day AVOID FACE, GROIN, UNDERARM, Disp: , Rfl: 0 .  denosumab (PROLIA) 60 MG/ML SOLN injection, Inject 60 mg into the skin every 6 (six) months. Administer in upper arm, thigh, or abdomen, Disp: , Rfl:  .  fluticasone (FLONASE) 50 MCG/ACT nasal spray, Place 2 sprays into both nostrils daily., Disp: , Rfl:  .  fluticasone furoate-vilanterol (BREO  ELLIPTA) 100-25 MCG/INH AEPB, Inhale 1 puff into the lungs daily., Disp: , Rfl:  .  levothyroxine (SYNTHROID, LEVOTHROID) 88 MCG tablet, take 1 tablet by mouth once daily, Disp: 90 tablet, Rfl: 3 .  metaxalone (SKELAXIN) 800 MG tablet, Take 1 tablet (800 mg total) by mouth 3 (three) times daily as needed for muscle spasms., Disp: 15 tablet, Rfl: 0 .  Multiple Vitamins-Minerals (PRESERVISION AREDS PO), Take 1 capsule by mouth daily. , Disp: , Rfl:  .  nitroGLYCERIN (NITROSTAT) 0.4 MG SL tablet, Place 1 tablet (0.4 mg  total) under the tongue every 5 (five) minutes as needed., Disp: 25 tablet, Rfl: 6 .  NON FORMULARY, Eye injection as directed Right eye., Disp: , Rfl:  .  omeprazole (PRILOSEC) 20 MG capsule, Take 20 mg by mouth daily., Disp: , Rfl:  .  oxyCODONE-acetaminophen (PERCOCET) 10-325 MG tablet, Take 1 tablet by mouth every 8 (eight) hours as needed for pain., Disp: , Rfl:  .  sertraline (ZOLOFT) 100 MG tablet, TAKE 1 TABLET DAILY, Disp: 90 tablet, Rfl: 3 .  tiotropium (SPIRIVA HANDIHALER) 18 MCG inhalation capsule, Place 1 capsule (18 mcg total) into inhaler and inhale daily., Disp: 90 capsule, Rfl: 0 .  Vitamin D, Cholecalciferol, 1000 units CAPS, Take by mouth daily., Disp: , Rfl:   Review of Systems  Constitutional: Positive for fatigue. Negative for activity change, appetite change, chills, diaphoresis, fever and unexpected weight change.  Respiratory: Positive for shortness of breath (Chronic issue).   Cardiovascular: Negative.   Gastrointestinal: Positive for abdominal distention, abdominal pain, constipation and diarrhea (Pt reports having diarrhea Saturday night after taking an otc laxative.). Negative for anal bleeding, blood in stool, nausea, rectal pain and vomiting.  Musculoskeletal: Positive for back pain and gait problem. Negative for arthralgias, joint swelling, myalgias, neck pain and neck stiffness.  Neurological: Negative for dizziness, light-headedness and headaches.    Social History  Substance Use Topics  . Smoking status: Former Research scientist (life sciences)  . Smokeless tobacco: Never Used     Comment: quit 50 years ago  . Alcohol use No   Objective:   BP 126/78 (BP Location: Left Arm, Patient Position: Sitting, Cuff Size: Normal)   Pulse 88   Temp 98.5 F (36.9 C) (Oral)   Resp 16   Wt 139 lb (63 kg)   BMI 20.53 kg/m   Physical Exam  Constitutional: She appears well-developed and well-nourished. No distress.  Cardiovascular: Normal rate, intact distal pulses and normal pulses.     Pulmonary/Chest: Effort normal.  Abdominal: Soft. Normal appearance. Bowel sounds are decreased. There is tenderness in the right lower quadrant. There is no rigidity, no rebound, no guarding, no CVA tenderness, no tenderness at McBurney's point and negative Murphy's sign. No hernia.  Skin: She is not diaphoretic.        Assessment & Plan:      Problem List Items Addressed This Visit    None    Visit Diagnoses    Constipation, unspecified constipation type    -  Primary   Relevant Medications   naloxegol oxalate (MOVANTIK) 12.5 MG TABS tablet   Other Relevant Orders   Ambulatory referral to Gastroenterology   Other dental procedure status       Relevant Medications   amoxicillin (AMOXIL) 500 MG capsule     Patient is a 82 y/o female presenting with RLQ abdominal pain. This has been ongoing problem for patient. Likely 2/2 opioid induced constipation. Will discontinue Linzess and have patient start 12.5 mg  Movantik daily. Have provided samples in office today. No uterus, ovaries, appendix or gallbladder, so no risk of infection/malignancies of these organs. CT Abdomen/pelvis from 01/2015 was negative for any abdominal mass. Have referred to Dr. Vira Agar in GI for further evaluation of this. Counseled on signs of intestinal obstruction.   Have provided patient with amoxicillin for dental procedure.    Return if symptoms worsen or fail to improve.  The entirety of the information documented in the History of Present Illness, Review of Systems and Physical Exam were personally obtained by me. Portions of this information were initially documented by Ashley Royalty, CMA and reviewed by me for thoroughness and accuracy.    Patient Instructions  Constipation, Adult Constipation is when a person:  Poops (has a bowel movement) fewer times in a week than normal.  Has a hard time pooping.  Has poop that is dry, hard, or bigger than normal. Follow these instructions at home: Eating and  drinking  Eat foods that have a lot of fiber, such as:  Fresh fruits and vegetables.  Whole grains.  Beans.  Eat less of foods that are high in fat, low in fiber, or overly processed, such as:  [place] fries.  Hamburgers.  Cookies.  Candy.  Soda.  Drink enough fluid to keep your pee (urine) clear or pale yellow. General instructions  Exercise regularly or as told by your doctor.  Go to the restroom when you feel like you need to poop. Do not hold it in.  Take over-the-counter and prescription medicines only as told by your doctor. These include any fiber supplements.  Do pelvic floor retraining exercises, such as:  Doing deep breathing while relaxing your lower belly (abdomen).  Relaxing your pelvic floor while pooping.  Watch your condition for any changes.  Keep all follow-up visits as told by your doctor. This is important. Contact a doctor if:  You have pain that gets worse.  You have a fever.  You have not pooped for 4 days.  You throw up (vomit).  You are not hungry.  You lose weight.  You are bleeding from the anus.  You have thin, pencil-like poop (stool). Get help right away if:  You have a fever, and your symptoms suddenly get worse.  You leak poop or have blood in your poop.  Your belly feels hard or bigger than normal (is bloated).  You have very bad belly pain.  You feel dizzy or you faint. This information is not intended to replace advice given to you by your health care provider. Make sure you discuss any questions you have with your health care provider. Document Released: 11/22/2007 Document Revised: 12/24/2015 Document Reviewed: 11/24/2015 Elsevier Interactive Patient Education  2017 [place], [facility]

## 2016-05-03 DIAGNOSIS — M9903 Segmental and somatic dysfunction of lumbar region: Secondary | ICD-10-CM | POA: Diagnosis not present

## 2016-05-03 DIAGNOSIS — M5431 Sciatica, right side: Secondary | ICD-10-CM | POA: Diagnosis not present

## 2016-05-03 DIAGNOSIS — M9905 Segmental and somatic dysfunction of pelvic region: Secondary | ICD-10-CM | POA: Diagnosis not present

## 2016-05-03 DIAGNOSIS — M5136 Other intervertebral disc degeneration, lumbar region: Secondary | ICD-10-CM | POA: Diagnosis not present

## 2016-05-05 DIAGNOSIS — M5136 Other intervertebral disc degeneration, lumbar region: Secondary | ICD-10-CM | POA: Diagnosis not present

## 2016-05-05 DIAGNOSIS — M5431 Sciatica, right side: Secondary | ICD-10-CM | POA: Diagnosis not present

## 2016-05-05 DIAGNOSIS — M9903 Segmental and somatic dysfunction of lumbar region: Secondary | ICD-10-CM | POA: Diagnosis not present

## 2016-05-05 DIAGNOSIS — M9905 Segmental and somatic dysfunction of pelvic region: Secondary | ICD-10-CM | POA: Diagnosis not present

## 2016-05-08 DIAGNOSIS — E039 Hypothyroidism, unspecified: Secondary | ICD-10-CM | POA: Diagnosis not present

## 2016-05-08 DIAGNOSIS — M9905 Segmental and somatic dysfunction of pelvic region: Secondary | ICD-10-CM | POA: Diagnosis not present

## 2016-05-08 DIAGNOSIS — M9903 Segmental and somatic dysfunction of lumbar region: Secondary | ICD-10-CM | POA: Diagnosis not present

## 2016-05-08 DIAGNOSIS — M5431 Sciatica, right side: Secondary | ICD-10-CM | POA: Diagnosis not present

## 2016-05-08 DIAGNOSIS — M5136 Other intervertebral disc degeneration, lumbar region: Secondary | ICD-10-CM | POA: Diagnosis not present

## 2016-05-08 DIAGNOSIS — M810 Age-related osteoporosis without current pathological fracture: Secondary | ICD-10-CM | POA: Diagnosis not present

## 2016-05-08 DIAGNOSIS — E559 Vitamin D deficiency, unspecified: Secondary | ICD-10-CM | POA: Diagnosis not present

## 2016-05-10 DIAGNOSIS — M9903 Segmental and somatic dysfunction of lumbar region: Secondary | ICD-10-CM | POA: Diagnosis not present

## 2016-05-10 DIAGNOSIS — M9905 Segmental and somatic dysfunction of pelvic region: Secondary | ICD-10-CM | POA: Diagnosis not present

## 2016-05-10 DIAGNOSIS — M5136 Other intervertebral disc degeneration, lumbar region: Secondary | ICD-10-CM | POA: Diagnosis not present

## 2016-05-10 DIAGNOSIS — M5431 Sciatica, right side: Secondary | ICD-10-CM | POA: Diagnosis not present

## 2016-05-16 DIAGNOSIS — M9903 Segmental and somatic dysfunction of lumbar region: Secondary | ICD-10-CM | POA: Diagnosis not present

## 2016-05-16 DIAGNOSIS — M9905 Segmental and somatic dysfunction of pelvic region: Secondary | ICD-10-CM | POA: Diagnosis not present

## 2016-05-16 DIAGNOSIS — M5136 Other intervertebral disc degeneration, lumbar region: Secondary | ICD-10-CM | POA: Diagnosis not present

## 2016-05-16 DIAGNOSIS — M5431 Sciatica, right side: Secondary | ICD-10-CM | POA: Diagnosis not present

## 2016-05-18 ENCOUNTER — Other Ambulatory Visit: Payer: Self-pay | Admitting: Family Medicine

## 2016-05-18 ENCOUNTER — Other Ambulatory Visit: Payer: Self-pay | Admitting: Cardiovascular Disease

## 2016-05-18 DIAGNOSIS — K219 Gastro-esophageal reflux disease without esophagitis: Secondary | ICD-10-CM

## 2016-05-18 DIAGNOSIS — M5431 Sciatica, right side: Secondary | ICD-10-CM | POA: Diagnosis not present

## 2016-05-18 DIAGNOSIS — M5136 Other intervertebral disc degeneration, lumbar region: Secondary | ICD-10-CM | POA: Diagnosis not present

## 2016-05-18 DIAGNOSIS — M9903 Segmental and somatic dysfunction of lumbar region: Secondary | ICD-10-CM | POA: Diagnosis not present

## 2016-05-18 DIAGNOSIS — M9905 Segmental and somatic dysfunction of pelvic region: Secondary | ICD-10-CM | POA: Diagnosis not present

## 2016-05-19 DIAGNOSIS — H353211 Exudative age-related macular degeneration, right eye, with active choroidal neovascularization: Secondary | ICD-10-CM | POA: Diagnosis not present

## 2016-05-23 DIAGNOSIS — M5431 Sciatica, right side: Secondary | ICD-10-CM | POA: Diagnosis not present

## 2016-05-23 DIAGNOSIS — M5136 Other intervertebral disc degeneration, lumbar region: Secondary | ICD-10-CM | POA: Diagnosis not present

## 2016-05-23 DIAGNOSIS — M9905 Segmental and somatic dysfunction of pelvic region: Secondary | ICD-10-CM | POA: Diagnosis not present

## 2016-05-23 DIAGNOSIS — M9903 Segmental and somatic dysfunction of lumbar region: Secondary | ICD-10-CM | POA: Diagnosis not present

## 2016-05-25 DIAGNOSIS — M5431 Sciatica, right side: Secondary | ICD-10-CM | POA: Diagnosis not present

## 2016-05-25 DIAGNOSIS — M5136 Other intervertebral disc degeneration, lumbar region: Secondary | ICD-10-CM | POA: Diagnosis not present

## 2016-05-25 DIAGNOSIS — M9905 Segmental and somatic dysfunction of pelvic region: Secondary | ICD-10-CM | POA: Diagnosis not present

## 2016-05-25 DIAGNOSIS — M9903 Segmental and somatic dysfunction of lumbar region: Secondary | ICD-10-CM | POA: Diagnosis not present

## 2016-05-26 DIAGNOSIS — L0291 Cutaneous abscess, unspecified: Secondary | ICD-10-CM | POA: Diagnosis not present

## 2016-05-26 DIAGNOSIS — L219 Seborrheic dermatitis, unspecified: Secondary | ICD-10-CM | POA: Diagnosis not present

## 2016-05-30 ENCOUNTER — Ambulatory Visit (INDEPENDENT_AMBULATORY_CARE_PROVIDER_SITE_OTHER): Payer: Medicare Other | Admitting: Podiatry

## 2016-05-30 DIAGNOSIS — M79609 Pain in unspecified limb: Secondary | ICD-10-CM

## 2016-05-30 DIAGNOSIS — B351 Tinea unguium: Secondary | ICD-10-CM

## 2016-05-30 DIAGNOSIS — L608 Other nail disorders: Secondary | ICD-10-CM

## 2016-05-30 DIAGNOSIS — R1031 Right lower quadrant pain: Secondary | ICD-10-CM | POA: Diagnosis not present

## 2016-05-30 DIAGNOSIS — L603 Nail dystrophy: Secondary | ICD-10-CM

## 2016-05-30 DIAGNOSIS — K5903 Drug induced constipation: Secondary | ICD-10-CM | POA: Diagnosis not present

## 2016-05-30 NOTE — Progress Notes (Signed)
SUBJECTIVE Patient  presents to office today complaining of elongated, thickened nails. Pain while ambulating in shoes. Patient is unable to trim their own nails.   OBJECTIVE General Patient is awake, alert, and oriented x 3 and in no acute distress. Derm Skin is dry and supple bilateral. Negative open lesions or macerations. Remaining integument unremarkable. Nails are tender, long, thickened and dystrophic with subungual debris, consistent with onychomycosis, 1-5 bilateral. No signs of infection noted. Vasc  DP and PT pedal pulses palpable bilaterally. Temperature gradient within normal limits.  Neuro Epicritic and protective threshold sensation diminished bilaterally.  Musculoskeletal Exam No symptomatic pedal deformities noted bilateral. Muscular strength within normal limits.  ASSESSMENT 1. Onychodystrophic nails 1-5 bilateral with hyperkeratosis of nails.  2. Onychomycosis of nail due to dermatophyte bilateral 3. Pain in foot bilateral  PLAN OF CARE 1. Patient evaluated today.  2. Instructed to maintain good pedal hygiene and foot care.  3. Mechanical debridement of nails 1-5 bilaterally performed using a nail nipper. Filed with dremel without incident.  4. Return to clinic in 3 mos.    Charnice Zwilling M Kyia Rhude, DPM    

## 2016-05-31 DIAGNOSIS — M9903 Segmental and somatic dysfunction of lumbar region: Secondary | ICD-10-CM | POA: Diagnosis not present

## 2016-05-31 DIAGNOSIS — M5431 Sciatica, right side: Secondary | ICD-10-CM | POA: Diagnosis not present

## 2016-05-31 DIAGNOSIS — M9905 Segmental and somatic dysfunction of pelvic region: Secondary | ICD-10-CM | POA: Diagnosis not present

## 2016-05-31 DIAGNOSIS — M5136 Other intervertebral disc degeneration, lumbar region: Secondary | ICD-10-CM | POA: Diagnosis not present

## 2016-06-02 DIAGNOSIS — M5416 Radiculopathy, lumbar region: Secondary | ICD-10-CM | POA: Diagnosis not present

## 2016-06-02 DIAGNOSIS — M48062 Spinal stenosis, lumbar region with neurogenic claudication: Secondary | ICD-10-CM | POA: Diagnosis not present

## 2016-06-02 DIAGNOSIS — M5136 Other intervertebral disc degeneration, lumbar region: Secondary | ICD-10-CM | POA: Diagnosis not present

## 2016-06-06 DIAGNOSIS — M9903 Segmental and somatic dysfunction of lumbar region: Secondary | ICD-10-CM | POA: Diagnosis not present

## 2016-06-06 DIAGNOSIS — M9905 Segmental and somatic dysfunction of pelvic region: Secondary | ICD-10-CM | POA: Diagnosis not present

## 2016-06-06 DIAGNOSIS — M5431 Sciatica, right side: Secondary | ICD-10-CM | POA: Diagnosis not present

## 2016-06-06 DIAGNOSIS — M5136 Other intervertebral disc degeneration, lumbar region: Secondary | ICD-10-CM | POA: Diagnosis not present

## 2016-06-08 DIAGNOSIS — M9903 Segmental and somatic dysfunction of lumbar region: Secondary | ICD-10-CM | POA: Diagnosis not present

## 2016-06-08 DIAGNOSIS — M5431 Sciatica, right side: Secondary | ICD-10-CM | POA: Diagnosis not present

## 2016-06-08 DIAGNOSIS — M9905 Segmental and somatic dysfunction of pelvic region: Secondary | ICD-10-CM | POA: Diagnosis not present

## 2016-06-08 DIAGNOSIS — M5136 Other intervertebral disc degeneration, lumbar region: Secondary | ICD-10-CM | POA: Diagnosis not present

## 2016-06-14 ENCOUNTER — Encounter: Payer: Self-pay | Admitting: Physician Assistant

## 2016-06-14 ENCOUNTER — Ambulatory Visit (INDEPENDENT_AMBULATORY_CARE_PROVIDER_SITE_OTHER): Payer: Medicare Other | Admitting: Physician Assistant

## 2016-06-14 VITALS — BP 120/62 | HR 78 | Temp 97.5°F | Resp 16 | Wt 138.9 lb

## 2016-06-14 DIAGNOSIS — I6523 Occlusion and stenosis of bilateral carotid arteries: Secondary | ICD-10-CM

## 2016-06-14 DIAGNOSIS — J069 Acute upper respiratory infection, unspecified: Secondary | ICD-10-CM | POA: Diagnosis not present

## 2016-06-14 MED ORDER — AMOXICILLIN-POT CLAVULANATE 875-125 MG PO TABS: 1.0000 | ORAL_TABLET | Freq: Two times a day (BID) | ORAL | 0 refills | Status: DC

## 2016-06-14 NOTE — Progress Notes (Signed)
Patient: Chelsea [name] Female    DOB: 07/01/1923   82 y.o.   MRN: [id number] Visit Date: 06/14/2016  Today's Provider: Mar Daring, PA-C   Chief Complaint  Patient presents with  . URI   Subjective:    URI   This is a new problem. The current episode started in the past 7 days (Started on Christmas day with sore throat). The problem has been gradually worsening. There has been no fever (She reports she had a temperature of 101.0 on Christmas day). Associated symptoms include congestion, ear pain (new hearing aids per patient), headaches, rhinorrhea and a sore throat. Pertinent negatives include no abdominal pain, chest pain, coughing, nausea, sinus pain, sneezing or wheezing.  Has been around sick contacts at [facility].    Allergies  Allergen Reactions  . Sulfa Antibiotics     Other reaction(s): Dizziness  . Sulfonamide Derivatives Other (See Comments)    Reaction:  Dizziness      Current Outpatient Prescriptions:  .  albuterol (PROVENTIL HFA;VENTOLIN HFA) 108 (90 Base) MCG/ACT inhaler, Inhale 2 puffs into the lungs every 6 (six) hours as needed for wheezing or shortness of breath., Disp: 1 Inhaler, Rfl: 2 .  amLODipine (NORVASC) 10 MG tablet, Take 1 tablet (10 mg total) by mouth daily., Disp: 90 tablet, Rfl: 3 .  ascorbic acid (VITAMIN C) 250 MG tablet, Take 1 tablet by mouth daily., Disp: , Rfl:  .  aspirin EC 81 MG tablet, Take 81 mg by mouth at bedtime. 325 mg during the day for pain as needed, Disp: , Rfl:  .  atorvastatin (LIPITOR) 80 MG tablet, TAKE 1 TABLET DAILY, Disp: 90 tablet, Rfl: 3 .  Calcium Carbonate-Vit D-Min (CALCIUM 1200 PO), Take by mouth daily., Disp: , Rfl:  .  clobetasol (TEMOVATE) 0.05 % external solution, apply to affected area twice a day AVOID FACE, GROIN, UNDERARM, Disp: , Rfl: 0 .  denosumab (PROLIA) 60 MG/ML SOLN injection, Inject 60 mg into the skin every 6 (six) months. Administer in upper arm, thigh, or abdomen, Disp: , Rfl:  .   fluticasone (FLONASE) 50 MCG/ACT nasal spray, Place 2 sprays into both nostrils daily., Disp: , Rfl:  .  fluticasone furoate-vilanterol (BREO ELLIPTA) 100-25 MCG/INH AEPB, Inhale 1 puff into the lungs daily., Disp: , Rfl:  .  levothyroxine (SYNTHROID, LEVOTHROID) 88 MCG tablet, take 1 tablet by mouth once daily, Disp: 90 tablet, Rfl: 3 .  metaxalone (SKELAXIN) 800 MG tablet, Take 1 tablet (800 mg total) by mouth 3 (three) times daily as needed for muscle spasms., Disp: 15 tablet, Rfl: 0 .  Multiple Vitamins-Minerals (PRESERVISION AREDS PO), Take 1 capsule by mouth daily. , Disp: , Rfl:  .  nitroGLYCERIN (NITROSTAT) 0.4 MG SL tablet, Place 1 tablet (0.4 mg total) under the tongue every 5 (five) minutes as needed., Disp: 25 tablet, Rfl: 6 .  omeprazole (PRILOSEC) 20 MG capsule, TAKE 1 CAPSULE DAILY, Disp: 90 capsule, Rfl: 1 .  oxyCODONE-acetaminophen (PERCOCET) 10-325 MG tablet, Take 1 tablet by mouth every 8 (eight) hours as needed for pain., Disp: , Rfl:  .  sertraline (ZOLOFT) 100 MG tablet, TAKE 1 TABLET DAILY, Disp: 90 tablet, Rfl: 3 .  tiotropium (SPIRIVA HANDIHALER) 18 MCG inhalation capsule, Place 1 capsule (18 mcg total) into inhaler and inhale daily., Disp: 90 capsule, Rfl: 0 .  Vitamin D, Cholecalciferol, 1000 units CAPS, Take by mouth daily., Disp: , Rfl:  .  amoxicillin (AMOXIL) 500 MG  capsule, Take four capsules one hour prior to dental procedure (Patient not taking: Reported on 06/14/2016), Disp: 8 capsule, Rfl: 1 .  NON FORMULARY, Eye injection as directed Right eye., Disp: , Rfl:   Review of Systems  Constitutional: Positive for chills, fatigue and fever.  HENT: Positive for congestion, ear pain (new hearing aids per patient), postnasal drip, rhinorrhea and sore throat. Negative for sinus pain, sinus pressure, sneezing, tinnitus and trouble swallowing.   Respiratory: Negative for cough, chest tightness, shortness of breath and wheezing.   Cardiovascular: Negative for chest pain and  palpitations.  Gastrointestinal: Negative for abdominal pain and nausea.  Neurological: Positive for headaches. Negative for dizziness.    Social History  Substance Use Topics  . Smoking status: Former Research scientist (life sciences)  . Smokeless tobacco: Never Used     Comment: quit 50 years ago  . Alcohol use No   Objective:   BP 120/62 (BP Location: Left Arm, Patient Position: Sitting, Cuff Size: Normal)   Pulse 78   Temp 97.5 F (36.4 C) (Oral)   Resp 16   Wt 138 lb 14.4 oz (63 kg)   SpO2 97%   BMI 20.51 kg/m   Physical Exam  Constitutional: She appears well-developed and well-nourished. No distress.  HENT:  Head: Normocephalic and atraumatic.  Right Ear: Hearing, tympanic membrane, external ear and ear canal normal.  Left Ear: Hearing, tympanic membrane, external ear and ear canal normal.  Nose: Mucosal edema and rhinorrhea present. Right sinus exhibits no maxillary sinus tenderness and no frontal sinus tenderness. Left sinus exhibits no maxillary sinus tenderness and no frontal sinus tenderness.  Mouth/Throat: Uvula is midline and mucous membranes are normal. No oropharyngeal exudate, posterior oropharyngeal edema or posterior oropharyngeal erythema (some cobblestoning).  Eyes: Conjunctivae are normal. Pupils are equal, round, and reactive to light. Right eye exhibits no discharge. Left eye exhibits no discharge. No scleral icterus.  Neck: Normal range of motion. Neck supple. No tracheal deviation present. No thyromegaly present.  Cardiovascular: Normal rate, regular rhythm and normal heart sounds.  Exam reveals no gallop and no friction rub.   No murmur heard. Pulmonary/Chest: Effort normal and breath sounds normal. No stridor. No respiratory distress. She has no wheezes. She has no rales.  Lymphadenopathy:    She has no cervical adenopathy.  Skin: Skin is warm and dry. She is not diaphoretic.  Vitals reviewed.      Assessment & Plan:     1. Upper respiratory tract infection, unspecified  type Worsening symptoms. Will treat with Augmentin as below. Push fluids, get plenty of rest. Call if no improvement in symptoms or if symptoms worsen. - amoxicillin-clavulanate (AUGMENTIN) 875-125 MG tablet; Take 1 tablet by mouth 2 (two) times daily.  Dispense: 20 tablet; Refill: 0       Mar Daring, PA-C  [facility]

## 2016-06-14 NOTE — Patient Instructions (Signed)
Upper Respiratory Infection, Adult Most upper respiratory infections (URIs) are a viral infection of the air passages leading to the lungs. A URI affects the nose, throat, and upper air passages. The most common type of URI is nasopharyngitis and is typically referred to as "the common cold." URIs run their course and usually go away on their own. Most of the time, a URI does not require medical attention, but sometimes a bacterial infection in the upper airways can follow a viral infection. This is called a secondary infection. Sinus and middle ear infections are common types of secondary upper respiratory infections. Bacterial pneumonia can also complicate a URI. A URI can worsen asthma and chronic obstructive pulmonary disease (COPD). Sometimes, these complications can require emergency medical care and may be life threatening. What are the causes? Almost all URIs are caused by viruses. A virus is a type of germ and can spread from one person to another. What increases the risk? You may be at risk for a URI if:  You smoke.  You have chronic heart or lung disease.  You have a weakened defense (immune) system.  You are very young or very old.  You have nasal allergies or asthma.  You work in crowded or poorly ventilated areas.  You work in health care facilities or schools.  What are the signs or symptoms? Symptoms typically develop 2-3 days after you come in contact with a cold virus. Most viral URIs last 7-10 days. However, viral URIs from the influenza virus (flu virus) can last 14-18 days and are typically more severe. Symptoms may include:  Runny or stuffy (congested) nose.  Sneezing.  Cough.  Sore throat.  Headache.  Fatigue.  Fever.  Loss of appetite.  Pain in your forehead, behind your eyes, and over your cheekbones (sinus pain).  Muscle aches.  How is this diagnosed? Your health care provider may diagnose a URI by:  Physical exam.  Tests to check that your  symptoms are not due to another condition such as: ? Strep throat. ? Sinusitis. ? Pneumonia. ? Asthma.  How is this treated? A URI goes away on its own with time. It cannot be cured with medicines, but medicines may be prescribed or recommended to relieve symptoms. Medicines may help:  Reduce your fever.  Reduce your cough.  Relieve nasal congestion.  Follow these instructions at home:  Take medicines only as directed by your health care provider.  Gargle warm saltwater or take cough drops to comfort your throat as directed by your health care provider.  Use a warm mist humidifier or inhale steam from a shower to increase air moisture. This may make it easier to breathe.  Drink enough fluid to keep your urine clear or pale yellow.  Eat soups and other clear broths and maintain good nutrition.  Rest as needed.  Return to work when your temperature has returned to normal or as your health care provider advises. You may need to stay home longer to avoid infecting others. You can also use a face mask and careful hand washing to prevent spread of the virus.  Increase the usage of your inhaler if you have asthma.  Do not use any tobacco products, including cigarettes, chewing tobacco, or electronic cigarettes. If you need help quitting, ask your health care provider. How is this prevented? The best way to protect yourself from getting a cold is to practice good hygiene.  Avoid oral or hand contact with people with cold symptoms.  Wash your   hands often if contact occurs.  There is no clear evidence that vitamin C, vitamin E, echinacea, or exercise reduces the chance of developing a cold. However, it is always recommended to get plenty of rest, exercise, and practice good nutrition. Contact a health care provider if:  You are getting worse rather than better.  Your symptoms are not controlled by medicine.  You have chills.  You have worsening shortness of breath.  You have  brown or red mucus.  You have yellow or brown nasal discharge.  You have pain in your face, especially when you bend forward.  You have a fever.  You have swollen neck glands.  You have pain while swallowing.  You have white areas in the back of your throat. Get help right away if:  You have severe or persistent: ? Headache. ? Ear pain. ? Sinus pain. ? Chest pain.  You have chronic lung disease and any of the following: ? Wheezing. ? Prolonged cough. ? Coughing up blood. ? A change in your usual mucus.  You have a stiff neck.  You have changes in your: ? Vision. ? Hearing. ? Thinking. ? Mood. This information is not intended to replace advice given to you by your health care provider. Make sure you discuss any questions you have with your health care provider. Document Released: 11/29/2000 Document Revised: 02/06/2016 Document Reviewed: 09/10/2013 Elsevier Interactive Patient Education  2017 Elsevier Inc.  

## 2016-06-20 ENCOUNTER — Telehealth: Payer: Self-pay | Admitting: Physician Assistant

## 2016-06-20 DIAGNOSIS — J0140 Acute pansinusitis, unspecified: Secondary | ICD-10-CM

## 2016-06-20 MED ORDER — DOXYCYCLINE HYCLATE 100 MG PO TABS: 100.0000 mg | ORAL_TABLET | Freq: Two times a day (BID) | ORAL | 0 refills | Status: DC

## 2016-06-20 NOTE — Telephone Encounter (Signed)
Patient advised.  Thanks,  -Joseline 

## 2016-06-20 NOTE — Telephone Encounter (Signed)
Please advise 

## 2016-06-20 NOTE — Telephone Encounter (Signed)
Doxycycline sent in to [facility]

## 2016-06-20 NOTE — Telephone Encounter (Signed)
Pt stated that she has been taking amoxicillin-clavulanate (AUGMENTIN) 875-125 MG tablet as directed since 06/14/16. Pt stated she has 2 days left of the medication but her throat and ears are still hurting her. Pt wanted to see if she should try something else. [facility] [address]. Please advise. Thanks TNP

## 2016-06-27 DIAGNOSIS — M9905 Segmental and somatic dysfunction of pelvic region: Secondary | ICD-10-CM | POA: Diagnosis not present

## 2016-06-27 DIAGNOSIS — M9903 Segmental and somatic dysfunction of lumbar region: Secondary | ICD-10-CM | POA: Diagnosis not present

## 2016-06-27 DIAGNOSIS — M5136 Other intervertebral disc degeneration, lumbar region: Secondary | ICD-10-CM | POA: Diagnosis not present

## 2016-06-27 DIAGNOSIS — M5431 Sciatica, right side: Secondary | ICD-10-CM | POA: Diagnosis not present

## 2016-07-04 ENCOUNTER — Telehealth: Payer: Self-pay | Admitting: Physician Assistant

## 2016-07-04 DIAGNOSIS — M9903 Segmental and somatic dysfunction of lumbar region: Secondary | ICD-10-CM | POA: Diagnosis not present

## 2016-07-04 DIAGNOSIS — M5431 Sciatica, right side: Secondary | ICD-10-CM | POA: Diagnosis not present

## 2016-07-04 DIAGNOSIS — J40 Bronchitis, not specified as acute or chronic: Secondary | ICD-10-CM

## 2016-07-04 DIAGNOSIS — M9905 Segmental and somatic dysfunction of pelvic region: Secondary | ICD-10-CM | POA: Diagnosis not present

## 2016-07-04 DIAGNOSIS — M5136 Other intervertebral disc degeneration, lumbar region: Secondary | ICD-10-CM | POA: Diagnosis not present

## 2016-07-04 MED ORDER — PREDNISONE 10 MG (21) PO TBPK: ORAL_TABLET | ORAL | 0 refills | Status: DC

## 2016-07-04 NOTE — Telephone Encounter (Signed)
Scheduled her for Friday at 11:30. Thanks,  -Joseline

## 2016-07-04 NOTE — Telephone Encounter (Signed)
Yes she should be re-evaluated since she has been on 2 antibiotics without symptom resolution.

## 2016-07-04 NOTE — Telephone Encounter (Signed)
6 day prednisone taper sent to [facility] aid and I will see her friday

## 2016-07-04 NOTE — Telephone Encounter (Signed)
Pt states she was in a couple weeks ago.  She is still having a lot of congestion,   She has taken all the medications prescribed.  Please advise.  [phone]  Thank sTeri

## 2016-07-04 NOTE — Telephone Encounter (Signed)
Advised patient as below.  

## 2016-07-04 NOTE — Telephone Encounter (Signed)
Please advise. Does she needs to come in?  Thanks,  -Joseline

## 2016-07-07 ENCOUNTER — Ambulatory Visit (INDEPENDENT_AMBULATORY_CARE_PROVIDER_SITE_OTHER): Payer: Medicare Other | Admitting: Physician Assistant

## 2016-07-07 ENCOUNTER — Encounter: Payer: Self-pay | Admitting: Physician Assistant

## 2016-07-07 VITALS — BP 142/68 | HR 70 | Temp 97.7°F | Resp 16 | Wt 137.0 lb

## 2016-07-07 DIAGNOSIS — K5903 Drug induced constipation: Secondary | ICD-10-CM

## 2016-07-07 DIAGNOSIS — T402X5A Adverse effect of other opioids, initial encounter: Secondary | ICD-10-CM | POA: Diagnosis not present

## 2016-07-07 DIAGNOSIS — J302 Other seasonal allergic rhinitis: Secondary | ICD-10-CM

## 2016-07-07 MED ORDER — LORATADINE 10 MG PO TABS: 10.0000 mg | ORAL_TABLET | Freq: Every day | ORAL | 1 refills | Status: DC

## 2016-07-07 NOTE — Patient Instructions (Signed)
Allergic Rhinitis Allergic rhinitis is when the mucous membranes in the nose respond to allergens. Allergens are particles in the air that cause your body to have an allergic reaction. This causes you to release allergic antibodies. Through a chain of events, these eventually cause you to release histamine into the blood stream. Although meant to protect the body, it is this release of histamine that causes your discomfort, such as frequent sneezing, congestion, and an itchy, runny nose. What are the causes? Seasonal allergic rhinitis (hay fever) is caused by pollen allergens that may come from grasses, trees, and weeds. Year-round allergic rhinitis (perennial allergic rhinitis) is caused by allergens such as house dust mites, pet dander, and mold spores. What are the signs or symptoms?  Nasal stuffiness (congestion).  Itchy, runny nose with sneezing and tearing of the eyes. How is this diagnosed? Your health care provider can help you determine the allergen or allergens that trigger your symptoms. If you and your health care provider are unable to determine the allergen, skin or blood testing may be used. Your health care provider will diagnose your condition after taking your health history and performing a physical exam. Your health care provider may assess you for other related conditions, such as asthma, pink eye, or an ear infection. How is this treated? Allergic rhinitis does not have a cure, but it can be controlled by:  Medicines that block allergy symptoms. These may include allergy shots, nasal sprays, and oral antihistamines.  Avoiding the allergen. Hay fever may often be treated with antihistamines in pill or nasal spray forms. Antihistamines block the effects of histamine. There are over-the-counter medicines that may help with nasal congestion and swelling around the eyes. Check with your health care provider before taking or giving this medicine. If avoiding the allergen or the  medicine prescribed do not work, there are many new medicines your health care provider can prescribe. Stronger medicine may be used if initial measures are ineffective. Desensitizing injections can be used if medicine and avoidance does not work. Desensitization is when a patient is given ongoing shots until the body becomes less sensitive to the allergen. Make sure you follow up with your health care provider if problems continue. Follow these instructions at home: It is not possible to completely avoid allergens, but you can reduce your symptoms by taking steps to limit your exposure to them. It helps to know exactly what you are allergic to so that you can avoid your specific triggers. Contact a health care provider if:  You have a fever.  You develop a cough that does not stop easily (persistent).  You have shortness of breath.  You start wheezing.  Symptoms interfere with normal daily activities. This information is not intended to replace advice given to you by your health care provider. Make sure you discuss any questions you have with your health care provider. Document Released: 02/28/2001 Document Revised: 02/04/2016 Document Reviewed: 02/10/2013 Elsevier Interactive Patient Education  2017 Elsevier Inc.  

## 2016-07-07 NOTE — Progress Notes (Signed)
Patient: Chelsea [name] Female    DOB: [birth year]   83 y.o.   MRN: [id number] Visit Date: 07/07/2016  Today's Provider: Mar Daring, PA-C   Chief Complaint  Patient presents with  . URI   Subjective:    URI   This is a recurrent problem. The current episode started 1 to 4 weeks ago (started Christmas day). Progression since onset: "it has been changing" There has been no fever. Associated symptoms include abdominal pain, congestion, coughing (productive of clear sputum), ear pain, headaches, a plugged ear sensation, rhinorrhea and a sore throat. Pertinent negatives include no chest pain, nausea, sinus pain, sneezing, swollen glands, vomiting or wheezing. Treatments tried: pt has been on Augmentin, Doxycycline and is now on Prednisone. The treatment provided no relief.  She states symptoms improve when she is outside, away from her apartment.   She is having sinus congestion, clear post nasal drip, and clearing throat cough.   Constipation: She is also continuing to have constipation. She has not had BM in last 3 days. She is still able to pass gas.  RLQ pain: Patient still is having RLQ abdominal pain. It is not consistent. Comes and goes. Can have days when it doesn't bother her. Has recently seen Dawson Bills, NP. Felt it may be musculoskeletal vs adhesions due to H/O multiple abdominal surgeries.     Allergies  Allergen Reactions  . Sulfa Antibiotics     Other reaction(s): Dizziness  . Sulfonamide Derivatives Other (See Comments)    Reaction:  Dizziness      Current Outpatient Prescriptions:  .  albuterol (PROVENTIL HFA;VENTOLIN HFA) 108 (90 Base) MCG/ACT inhaler, Inhale 2 puffs into the lungs every 6 (six) hours as needed for wheezing or shortness of breath., Disp: 1 Inhaler, Rfl: 2 .  amLODipine (NORVASC) 10 MG tablet, Take 1 tablet (10 mg total) by mouth daily., Disp: 90 tablet, Rfl: 3 .  amoxicillin-clavulanate (AUGMENTIN) 875-125 MG tablet, Take 1 tablet by  mouth 2 (two) times daily., Disp: 20 tablet, Rfl: 0 .  ascorbic acid (VITAMIN C) 250 MG tablet, Take 1 tablet by mouth daily., Disp: , Rfl:  .  aspirin EC 81 MG tablet, Take 81 mg by mouth at bedtime. 325 mg during the day for pain as needed, Disp: , Rfl:  .  atorvastatin (LIPITOR) 80 MG tablet, TAKE 1 TABLET DAILY, Disp: 90 tablet, Rfl: 3 .  Calcium Carbonate-Vit D-Min (CALCIUM 1200 PO), Take by mouth daily., Disp: , Rfl:  .  clobetasol (TEMOVATE) 0.05 % external solution, apply to affected area twice a day AVOID FACE, GROIN, UNDERARM, Disp: , Rfl: 0 .  denosumab (PROLIA) 60 MG/ML SOLN injection, Inject 60 mg into the skin every 6 (six) months. Administer in upper arm, thigh, or abdomen, Disp: , Rfl:  .  fluticasone (FLONASE) 50 MCG/ACT nasal spray, Place 2 sprays into both nostrils daily., Disp: , Rfl:  .  fluticasone furoate-vilanterol (BREO ELLIPTA) 100-25 MCG/INH AEPB, Inhale 1 puff into the lungs daily., Disp: , Rfl:  .  levothyroxine (SYNTHROID, LEVOTHROID) 88 MCG tablet, take 1 tablet by mouth once daily, Disp: 90 tablet, Rfl: 3 .  metaxalone (SKELAXIN) 800 MG tablet, Take 1 tablet (800 mg total) by mouth 3 (three) times daily as needed for muscle spasms., Disp: 15 tablet, Rfl: 0 .  Multiple Vitamins-Minerals (PRESERVISION AREDS PO), Take 1 capsule by mouth daily. , Disp: , Rfl:  .  nitroGLYCERIN (NITROSTAT) 0.4 MG SL tablet, Place 1  tablet (0.4 mg total) under the tongue every 5 (five) minutes as needed., Disp: 25 tablet, Rfl: 6 .  NON FORMULARY, Eye injection as directed Right eye., Disp: , Rfl:  .  oxyCODONE-acetaminophen (PERCOCET) 10-325 MG tablet, Take 1 tablet by mouth every 8 (eight) hours as needed for pain., Disp: , Rfl:  .  predniSONE (STERAPRED UNI-PAK 21 TAB) 10 MG (21) TBPK tablet, Take as directed on package instructions, Disp: 21 tablet, Rfl: 0 .  RA SENNA PLUS 8.6-50 MG tablet, Take 1 tablet by mouth daily., Disp: , Rfl: 1 .  sertraline (ZOLOFT) 100 MG tablet, TAKE 1 TABLET  DAILY, Disp: 90 tablet, Rfl: 3 .  tiotropium (SPIRIVA HANDIHALER) 18 MCG inhalation capsule, Place 1 capsule (18 mcg total) into inhaler and inhale daily., Disp: 90 capsule, Rfl: 0 .  Vitamin D, Cholecalciferol, 1000 units CAPS, Take by mouth daily., Disp: , Rfl:   Review of Systems  Constitutional: Positive for fatigue. Negative for appetite change, diaphoresis and fever.  HENT: Positive for congestion, ear pain, postnasal drip, rhinorrhea, sinus pressure and sore throat. Negative for sinus pain, sneezing and trouble swallowing.   Eyes: Negative for visual disturbance.  Respiratory: Positive for cough (productive of clear sputum). Negative for wheezing.   Cardiovascular: Negative for chest pain.  Gastrointestinal: Positive for abdominal pain and constipation. Negative for abdominal distention, nausea and vomiting.  Genitourinary: Negative.   Musculoskeletal: Positive for back pain.  Neurological: Positive for headaches. Negative for dizziness, weakness and numbness.    Social History  Substance Use Topics  . Smoking status: Former Research scientist (life sciences)  . Smokeless tobacco: Never Used     Comment: quit 50 years ago  . Alcohol use No   Objective:   BP (!) 142/68 (BP Location: Left Arm, Patient Position: Sitting, Cuff Size: Normal)   Pulse 70   Temp 97.7 F (36.5 C) (Oral)   Resp 16   Wt 137 lb (62.1 kg)   BMI 20.23 kg/m   Physical Exam  Constitutional: She appears well-developed and well-nourished. No distress.  HENT:  Head: Normocephalic and atraumatic.  Right Ear: Hearing, tympanic membrane, external ear and ear canal normal.  Left Ear: Hearing, tympanic membrane, external ear and ear canal normal.  Nose: Right sinus exhibits maxillary sinus tenderness and frontal sinus tenderness. Left sinus exhibits maxillary sinus tenderness and frontal sinus tenderness.  Mouth/Throat: Uvula is midline, oropharynx is clear and moist and mucous membranes are normal. No oropharyngeal exudate, posterior  oropharyngeal edema or posterior oropharyngeal erythema.  Pale turbinates, clear drainage and sinus pressure not tenderness  Neck: Normal range of motion. Neck supple. No tracheal deviation present. No thyromegaly present.  Cardiovascular: Normal rate, regular rhythm and normal heart sounds.  Exam reveals no gallop and no friction rub.   No murmur heard. Pulmonary/Chest: Effort normal and breath sounds normal. No stridor. No respiratory distress. She has no wheezes. She has no rales.  Abdominal: Soft. Bowel sounds are normal. She exhibits no distension. There is tenderness (generalized).  Lymphadenopathy:    She has no cervical adenopathy.  Skin: She is not diaphoretic.  Vitals reviewed.      Assessment & Plan:     1. Acute seasonal allergic rhinitis due to other allergen Suspect URI symptoms are allergy related. Patient reports symptoms are not as severe as previously but that she is just not completely getting better. Patient has not had allergies before. She does report symptoms improve with outdoors, thus the trigger may be in her apartment at [facility]. She reports there has been mold found in her attic in the past. Advised patient to have them evaluate her house for mold again and to call if she needs a letter of necessity from me. She is to call if no improvement. - loratadine (CLARITIN) 10 MG tablet; Take 1 tablet (10 mg total) by mouth daily.  Dispense: 90 tablet; Refill: 1  2. Constipation due to opioid therapy Still not improving. She continues to use RA Senna plus 8.6-50mg  one tablet daily. Advised patient to use two tablets daily until having loose, but not watery BM daily, then go back to one tablet daily. If she still does not have success she is to call GI back.      Patient seen and examined by Mar Daring, PA-C, and note scribed by Renaldo Fiddler, CMA.  Mar Daring, PA-C  [facility]

## 2016-07-12 DIAGNOSIS — M5431 Sciatica, right side: Secondary | ICD-10-CM | POA: Diagnosis not present

## 2016-07-12 DIAGNOSIS — M9903 Segmental and somatic dysfunction of lumbar region: Secondary | ICD-10-CM | POA: Diagnosis not present

## 2016-07-12 DIAGNOSIS — M5136 Other intervertebral disc degeneration, lumbar region: Secondary | ICD-10-CM | POA: Diagnosis not present

## 2016-07-12 DIAGNOSIS — M9905 Segmental and somatic dysfunction of pelvic region: Secondary | ICD-10-CM | POA: Diagnosis not present

## 2016-07-13 ENCOUNTER — Other Ambulatory Visit: Payer: Self-pay | Admitting: Physician Assistant

## 2016-07-13 DIAGNOSIS — I10 Essential (primary) hypertension: Secondary | ICD-10-CM

## 2016-07-13 NOTE — Telephone Encounter (Signed)
Last ov 07/07/16  Last filled 04/30/15. Please review. Thank you. sd

## 2016-07-19 DIAGNOSIS — M9903 Segmental and somatic dysfunction of lumbar region: Secondary | ICD-10-CM | POA: Diagnosis not present

## 2016-07-19 DIAGNOSIS — M9905 Segmental and somatic dysfunction of pelvic region: Secondary | ICD-10-CM | POA: Diagnosis not present

## 2016-07-19 DIAGNOSIS — M5431 Sciatica, right side: Secondary | ICD-10-CM | POA: Diagnosis not present

## 2016-07-19 DIAGNOSIS — M5136 Other intervertebral disc degeneration, lumbar region: Secondary | ICD-10-CM | POA: Diagnosis not present

## 2016-07-20 DIAGNOSIS — E559 Vitamin D deficiency, unspecified: Secondary | ICD-10-CM | POA: Diagnosis not present

## 2016-07-20 DIAGNOSIS — E039 Hypothyroidism, unspecified: Secondary | ICD-10-CM | POA: Diagnosis not present

## 2016-07-20 DIAGNOSIS — M810 Age-related osteoporosis without current pathological fracture: Secondary | ICD-10-CM | POA: Diagnosis not present

## 2016-07-24 DIAGNOSIS — M48062 Spinal stenosis, lumbar region with neurogenic claudication: Secondary | ICD-10-CM | POA: Diagnosis not present

## 2016-07-24 DIAGNOSIS — M5416 Radiculopathy, lumbar region: Secondary | ICD-10-CM | POA: Diagnosis not present

## 2016-07-25 DIAGNOSIS — E559 Vitamin D deficiency, unspecified: Secondary | ICD-10-CM | POA: Diagnosis not present

## 2016-07-25 DIAGNOSIS — E039 Hypothyroidism, unspecified: Secondary | ICD-10-CM | POA: Diagnosis not present

## 2016-07-25 DIAGNOSIS — M810 Age-related osteoporosis without current pathological fracture: Secondary | ICD-10-CM | POA: Diagnosis not present

## 2016-07-25 DIAGNOSIS — E875 Hyperkalemia: Secondary | ICD-10-CM | POA: Diagnosis not present

## 2016-07-26 DIAGNOSIS — M5431 Sciatica, right side: Secondary | ICD-10-CM | POA: Diagnosis not present

## 2016-07-26 DIAGNOSIS — M9905 Segmental and somatic dysfunction of pelvic region: Secondary | ICD-10-CM | POA: Diagnosis not present

## 2016-07-26 DIAGNOSIS — M5136 Other intervertebral disc degeneration, lumbar region: Secondary | ICD-10-CM | POA: Diagnosis not present

## 2016-07-26 DIAGNOSIS — M9903 Segmental and somatic dysfunction of lumbar region: Secondary | ICD-10-CM | POA: Diagnosis not present

## 2016-07-28 DIAGNOSIS — H353211 Exudative age-related macular degeneration, right eye, with active choroidal neovascularization: Secondary | ICD-10-CM | POA: Diagnosis not present

## 2016-08-01 ENCOUNTER — Ambulatory Visit: Payer: Medicare Other | Admitting: Physician Assistant

## 2016-08-03 DIAGNOSIS — M5431 Sciatica, right side: Secondary | ICD-10-CM | POA: Diagnosis not present

## 2016-08-03 DIAGNOSIS — M9903 Segmental and somatic dysfunction of lumbar region: Secondary | ICD-10-CM | POA: Diagnosis not present

## 2016-08-03 DIAGNOSIS — M9905 Segmental and somatic dysfunction of pelvic region: Secondary | ICD-10-CM | POA: Diagnosis not present

## 2016-08-03 DIAGNOSIS — M5136 Other intervertebral disc degeneration, lumbar region: Secondary | ICD-10-CM | POA: Diagnosis not present

## 2016-08-04 ENCOUNTER — Encounter: Payer: Self-pay | Admitting: Physician Assistant

## 2016-08-04 ENCOUNTER — Ambulatory Visit (INDEPENDENT_AMBULATORY_CARE_PROVIDER_SITE_OTHER): Payer: Medicare Other | Admitting: Physician Assistant

## 2016-08-04 VITALS — BP 138/60 | HR 72 | Temp 97.8°F | Resp 16 | Wt 138.8 lb

## 2016-08-04 DIAGNOSIS — J0140 Acute pansinusitis, unspecified: Secondary | ICD-10-CM | POA: Diagnosis not present

## 2016-08-04 MED ORDER — AMOXICILLIN-POT CLAVULANATE 875-125 MG PO TABS: 1.0000 | ORAL_TABLET | Freq: Two times a day (BID) | ORAL | 0 refills | Status: DC

## 2016-08-04 NOTE — Patient Instructions (Signed)

## 2016-08-04 NOTE — Progress Notes (Signed)
Patient: Chelsea [name] Female    DOB: [birth year]   83 y.o.   MRN: [id number] Visit Date: 08/04/2016  Today's Provider: Mar Daring, PA-C   Chief Complaint  Patient presents with  . Allergies   Subjective:    HPI Allergic Rhinitis: Chelsea [name] is here for evaluation of possible allergic rhinitis. She reports that every morning she wakes up with runny nose and stuffy.Patient's symptoms include clear rhinorrhea, nasal congestion and scratchy throat.  Current triggers include exposure to no known precipitant. The patient has been suffering from these symptoms for approximately few months. The patient has tried prescription antihistamines and prescription nasal sprays with good relief of symptoms.    Allergies  Allergen Reactions  . Sulfa Antibiotics     Other reaction(s): Dizziness  . Sulfonamide Derivatives Other (See Comments)    Reaction:  Dizziness      Current Outpatient Prescriptions:  .  albuterol (PROVENTIL HFA;VENTOLIN HFA) 108 (90 Base) MCG/ACT inhaler, Inhale 2 puffs into the lungs every 6 (six) hours as needed for wheezing or shortness of breath., Disp: 1 Inhaler, Rfl: 2 .  aspirin EC 81 MG tablet, Take 81 mg by mouth at bedtime. 325 mg during the day for pain as needed, Disp: , Rfl:  .  atorvastatin (LIPITOR) 80 MG tablet, TAKE 1 TABLET DAILY, Disp: 90 tablet, Rfl: 3 .  denosumab (PROLIA) 60 MG/ML SOLN injection, Inject 60 mg into the skin every 6 (six) months. Administer in upper arm, thigh, or abdomen, Disp: , Rfl:  .  fluticasone (FLONASE) 50 MCG/ACT nasal spray, Place 2 sprays into both nostrils daily., Disp: , Rfl:  .  fluticasone furoate-vilanterol (BREO ELLIPTA) 100-25 MCG/INH AEPB, Inhale 1 puff into the lungs daily., Disp: , Rfl:  .  levothyroxine (SYNTHROID, LEVOTHROID) 88 MCG tablet, take 1 tablet by mouth once daily, Disp: 90 tablet, Rfl: 3 .  loratadine (CLARITIN) 10 MG tablet, Take 1 tablet (10 mg total) by mouth daily., Disp: 90  tablet, Rfl: 1 .  Multiple Vitamins-Minerals (PRESERVISION AREDS PO), Take 1 capsule by mouth daily. , Disp: , Rfl:  .  nitroGLYCERIN (NITROSTAT) 0.4 MG SL tablet, Place 1 tablet (0.4 mg total) under the tongue every 5 (five) minutes as needed., Disp: 25 tablet, Rfl: 6 .  RA SENNA PLUS 8.6-50 MG tablet, Take 1 tablet by mouth daily., Disp: , Rfl: 1 .  sertraline (ZOLOFT) 100 MG tablet, TAKE 1 TABLET DAILY, Disp: 90 tablet, Rfl: 3 .  tiotropium (SPIRIVA HANDIHALER) 18 MCG inhalation capsule, Place 1 capsule (18 mcg total) into inhaler and inhale daily., Disp: 90 capsule, Rfl: 0 .  Vitamin D, Cholecalciferol, 1000 units CAPS, Take by mouth daily., Disp: , Rfl:  .  amLODipine (NORVASC) 10 MG tablet, take 1 tablet by mouth once daily, Disp: 90 tablet, Rfl: 3 .  ascorbic acid (VITAMIN C) 250 MG tablet, Take 1 tablet by mouth daily., Disp: , Rfl:  .  clobetasol (TEMOVATE) 0.05 % external solution, apply to affected area twice a day AVOID FACE, GROIN, UNDERARM, Disp: , Rfl: 0 .  metaxalone (SKELAXIN) 800 MG tablet, Take 1 tablet (800 mg total) by mouth 3 (three) times daily as needed for muscle spasms., Disp: 15 tablet, Rfl: 0 .  NON FORMULARY, Eye injection as directed Right eye., Disp: , Rfl:  .  oxyCODONE-acetaminophen (PERCOCET) 10-325 MG tablet, Take 1 tablet by mouth every 8 (eight) hours as needed for pain., Disp: , Rfl:  .  predniSONE (STERAPRED UNI-PAK 21 TAB) 10 MG (21) TBPK tablet, Take as directed on package instructions (Patient not taking: Reported on 08/04/2016), Disp: 21 tablet, Rfl: 0  Review of Systems  Constitutional: Negative for fever.  HENT: Positive for congestion, postnasal drip, rhinorrhea, sinus pressure, sneezing and sore throat (off and on). Negative for ear pain, sinus pain, tinnitus and trouble swallowing.   Respiratory: Negative for choking, chest tightness, shortness of breath and wheezing.   Cardiovascular: Negative for chest pain, palpitations and leg swelling.    Gastrointestinal: Negative for abdominal pain and nausea.  Neurological: Positive for headaches. Negative for dizziness.    Social History  Substance Use Topics  . Smoking status: Former Research scientist (life sciences)  . Smokeless tobacco: Never Used     Comment: quit 50 years ago  . Alcohol use No   Objective:   BP 138/60 (BP Location: Left Arm, Patient Position: Sitting, Cuff Size: Normal)   Pulse 72   Temp 97.8 F (36.6 C) (Oral)   Resp 16   Wt 138 lb 12.8 oz (63 kg)   SpO2 95%   BMI 20.50 kg/m   Physical Exam  Constitutional: She appears well-developed and well-nourished. No distress.  HENT:  Head: Normocephalic and atraumatic.  Right Ear: Hearing, tympanic membrane, external ear and ear canal normal.  Left Ear: Hearing, tympanic membrane, external ear and ear canal normal.  Nose: Right sinus exhibits maxillary sinus tenderness and frontal sinus tenderness. Left sinus exhibits maxillary sinus tenderness and frontal sinus tenderness.  Mouth/Throat: Uvula is midline, oropharynx is clear and moist and mucous membranes are normal. No oropharyngeal exudate.  Eyes: Conjunctivae are normal. Pupils are equal, round, and reactive to light. Right eye exhibits no discharge. Left eye exhibits no discharge. No scleral icterus.  Neck: Normal range of motion. Neck supple. No tracheal deviation present. No thyromegaly present.  Cardiovascular: Normal rate, regular rhythm and normal heart sounds.  Exam reveals no gallop and no friction rub.   No murmur heard. Pulmonary/Chest: Effort normal and breath sounds normal. No stridor. No respiratory distress. She has no wheezes. She has no rales.  Lymphadenopathy:    She has no cervical adenopathy.  Skin: Skin is warm and dry. She is not diaphoretic.  Vitals reviewed.     Assessment & Plan:     1. Acute pansinusitis, recurrence not specified Worsening symptoms that have not responded to OTC medications. Will give augmentin as below. Continue allergy medications.  Stay well hydrated and get plenty of rest. Call if no symptom improvement or if symptoms worsen. - amoxicillin-clavulanate (AUGMENTIN) 875-125 MG tablet; Take 1 tablet by mouth 2 (two) times daily.  Dispense: 14 tablet; Refill: 0       Mar Daring, PA-C  [facility]

## 2016-08-07 ENCOUNTER — Telehealth: Payer: Self-pay

## 2016-08-07 NOTE — Telephone Encounter (Signed)
Patient advised as directed below.  Thanks,  -Ammi Hutt 

## 2016-08-07 NOTE — Telephone Encounter (Signed)
Please call patient as below thank you-aa

## 2016-08-07 NOTE — Telephone Encounter (Signed)
Patient called and states she saw you on Friday and was given an antibiotic. On Saturday she woke up with chills, having body aches, temp 99.6 and nausea. She feels like it may be the flu. Wants to know does she need to be taking Tamiflu also?-aa

## 2016-08-07 NOTE — Telephone Encounter (Signed)
If she has not had anymore fevers since Saturday it is most likely secondary to the URI infection. Continue antibiotic for now.

## 2016-08-14 ENCOUNTER — Ambulatory Visit: Payer: Medicare Other | Admitting: Podiatry

## 2016-08-25 DIAGNOSIS — D485 Neoplasm of uncertain behavior of skin: Secondary | ICD-10-CM | POA: Diagnosis not present

## 2016-08-25 DIAGNOSIS — L219 Seborrheic dermatitis, unspecified: Secondary | ICD-10-CM | POA: Diagnosis not present

## 2016-08-25 DIAGNOSIS — C44329 Squamous cell carcinoma of skin of other parts of face: Secondary | ICD-10-CM | POA: Diagnosis not present

## 2016-08-30 DIAGNOSIS — M9903 Segmental and somatic dysfunction of lumbar region: Secondary | ICD-10-CM | POA: Diagnosis not present

## 2016-08-30 DIAGNOSIS — M5136 Other intervertebral disc degeneration, lumbar region: Secondary | ICD-10-CM | POA: Diagnosis not present

## 2016-08-30 DIAGNOSIS — M9905 Segmental and somatic dysfunction of pelvic region: Secondary | ICD-10-CM | POA: Diagnosis not present

## 2016-08-30 DIAGNOSIS — M5431 Sciatica, right side: Secondary | ICD-10-CM | POA: Diagnosis not present

## 2016-09-01 ENCOUNTER — Ambulatory Visit (INDEPENDENT_AMBULATORY_CARE_PROVIDER_SITE_OTHER): Payer: Medicare Other | Admitting: Physician Assistant

## 2016-09-01 ENCOUNTER — Encounter: Payer: Self-pay | Admitting: Physician Assistant

## 2016-09-01 VITALS — BP 130/70 | HR 78 | Temp 97.7°F | Resp 16 | Wt 136.6 lb

## 2016-09-01 DIAGNOSIS — J302 Other seasonal allergic rhinitis: Secondary | ICD-10-CM | POA: Diagnosis not present

## 2016-09-01 DIAGNOSIS — K5903 Drug induced constipation: Secondary | ICD-10-CM

## 2016-09-01 DIAGNOSIS — T402X5A Adverse effect of other opioids, initial encounter: Secondary | ICD-10-CM | POA: Diagnosis not present

## 2016-09-01 DIAGNOSIS — D72829 Elevated white blood cell count, unspecified: Secondary | ICD-10-CM | POA: Diagnosis not present

## 2016-09-01 DIAGNOSIS — R7989 Other specified abnormal findings of blood chemistry: Secondary | ICD-10-CM | POA: Diagnosis not present

## 2016-09-01 DIAGNOSIS — E875 Hyperkalemia: Secondary | ICD-10-CM | POA: Diagnosis not present

## 2016-09-01 NOTE — Patient Instructions (Signed)
Naloxegol oral tablets What is this medicine? NALOXEGOL (nal OX e GAHL) is used to treat constipation caused by opioids (pain medicine). Tell your health care professional if you stop taking your opioid pain medicine. This medicine may be used for other purposes; ask your health care provider or pharmacist if you have questions. COMMON BRAND NAME(S): MOVANTIK What should I tell my health care provider before I take this medicine? They need to know if you have any of these conditions: -cancer or tumor in abdomen, intestine, or stomach -diverticulitis -history of bowel blockage -history of stomach ulcer -inflammatory bowel disease -kidney disease -liver disease -Ogilvie's syndrome -stomach or intestine problems -an unusual or allergic reaction to naloxegol, other medicines, foods, dyes, or preservatives -pregnant or trying to get pregnant -breast-feeding How should I use this medicine? Take this medicine by mouth with a glass of water. Follow the directions on the prescription label. Swallow medicine whole. Do not cut or chew this medicine. The medicine may also be crushed and mixed with 4 ounces of water. Drink immediately after mixing. Take your medicine one time each day, on an empty stomach, at least 1 hour before your first meal of the day or 2 hours after the meal. If you are unable to tolerate your dose, inform your healthcare provider so that your dose can be adjusted. Do not take additional laxatives except on your healthcare provider's advice. Your healthcare provider may prescribe other laxatives if your medicine does not work well enough after 3 days of treatment. Tell your healthcare provider if you stop taking your pain medicine. If you stop taking your pain medicine, you should also stop taking this medicine. Do not take your medicine more often than directed. Do not stop taking except on your doctor's advice. A special MedGuide will be given to you before each treatment. Be sure to  read this information carefully each time. Talk to your pediatrician regarding the use of this medicine in children. Special care may be needed. Overdosage: If you think you have taken too much of this medicine contact a poison control center or emergency room at once. NOTE: This medicine is only for you. Do not share this medicine with others. What if I miss a dose? If you miss a dose, take it as soon as you can. If it is almost time for your next dose, take only that dose. Do not take double or extra doses. What may interact with this medicine? -carbamazepine -certain medicines for fungal infections like ketoconazole and itraconazole -clarithromycin -diltiazem -erythromycin -grapefruit juice -methylnaltrexone -naloxone -naltrexone -rifampin -St. John's Wort -verapamil This list may not describe all possible interactions. Give your health care provider a list of all the medicines, herbs, non-prescription drugs, or dietary supplements you use. Also tell them if you smoke, drink alcohol, or use illegal drugs. Some items may interact with your medicine. What should I watch for while using this medicine? Visit your doctor for regular check ups. Tell your doctor if your symptoms do not get better or if they get worse. If you develop unusually persistent or worsening abdominal pain, stop taking your medicine and seek medical attention. You may have symptoms of opioid withdrawal during treatment with this medicine. Symptoms include sweating, chills, diarrhea, stomach pain, anxiety, irritability, and yawning. Tell your health care provider if you have any of these symptoms. Also, if you take methadone to treat your pain, you may be more likely to have stomach pain and diarrhea compared to people who do not take  methadone. If you take too much of this medicine, call your healthcare provider or go to the nearest emergency room right away. What side effects may I notice from receiving this  medicine? Side effects that you should report to your doctor or health care professional as soon as possible: -allergic reactions like skin rash, itching or hives, swelling of the face, lips, or tongue -new or worsening stomach pain -severe or prolonged diarrhea -signs and symptoms of opioid withdrawal such as sweating, chills, diarrhea, stomach pain, anxiety, irritability, and yawning Side effects that usually do not require medical attention (report to your doctor or health care professional if they continue or are bothersome): -diarrhea -headache -nausea -stomach gas -stomach pain -vomiting This list may not describe all possible side effects. Call your doctor for medical advice about side effects. You may report side effects to FDA at 1-800-FDA-1088. Where should I keep my medicine? Keep out of the reach of children. This medicine can be abused. Keep your medicine in a safe place to protect it from theft. Do not share this medicine with anyone. Selling or giving away this medicine is dangerous and against the law. Follow the directions in the [place]. Store at room temperature between 20 and 25 degrees C (68 and 77 degrees F). Throw away any unused medicine after the expiration date. NOTE: This sheet is a summary. It may not cover all possible information. If you have questions about this medicine, talk to your doctor, pharmacist, or health care provider.  2018 Elsevier/Gold Standard (2015-07-08 12:10:23)

## 2016-09-01 NOTE — Progress Notes (Signed)
Patient: Chelsea [name] Female    DOB: 10-Oct-1923   83 y.o.   MRN: [id number] Visit Date: 09/01/2016  Today's Provider: Mar Daring, PA-C   Chief Complaint  Patient presents with  . Follow-up    Potassium   Subjective:    HPI Patient is here today to follow-up on Potassium.  Labs were ordered by Endo on 07/25/16 potassium level 5.6. She reports feeling fatigue (very sleepy) thinks is coming from the Lorazepam She does report that she still has sleepiness even when she does not take the lorazepam. She also is having a runny nose, this has been fairly chronic.She denies chest pain, vomiting, chest pain, palpitations, leg swelling. She is having mold checks done in her apartment. I have told her previously that I felt this was more allergy related in the past.   Depression screen [facility] 2/9 09/01/2016  Decreased Interest 1  Down, Depressed, Hopeless 1  PHQ - 2 Score 2  Altered sleeping 3  Tired, decreased energy 3  Change in appetite 1  Feeling bad or failure about yourself  0  Trouble concentrating 1  Moving slowly or fidgety/restless 1  Suicidal thoughts 1  PHQ-9 Score 12  Difficult doing work/chores Not difficult at all      Allergies  Allergen Reactions  . Sulfa Antibiotics     Other reaction(s): Dizziness  . Sulfonamide Derivatives Other (See Comments)    Reaction:  Dizziness      Current Outpatient Prescriptions:  .  albuterol (PROVENTIL HFA;VENTOLIN HFA) 108 (90 Base) MCG/ACT inhaler, Inhale 2 puffs into the lungs every 6 (six) hours as needed for wheezing or shortness of breath., Disp: 1 Inhaler, Rfl: 2 .  aspirin EC 81 MG tablet, Take 81 mg by mouth at bedtime. 325 mg during the day for pain as needed, Disp: , Rfl:  .  atorvastatin (LIPITOR) 80 MG tablet, TAKE 1 TABLET DAILY, Disp: 90 tablet, Rfl: 3 .  denosumab (PROLIA) 60 MG/ML SOLN injection, Inject 60 mg into the skin every 6 (six) months. Administer in upper arm, thigh, or abdomen, Disp: , Rfl:    .  fluticasone (FLONASE) 50 MCG/ACT nasal spray, Place 2 sprays into both nostrils daily., Disp: , Rfl:  .  fluticasone furoate-vilanterol (BREO ELLIPTA) 100-25 MCG/INH AEPB, Inhale 1 puff into the lungs daily., Disp: , Rfl:  .  levothyroxine (SYNTHROID, LEVOTHROID) 88 MCG tablet, take 1 tablet by mouth once daily, Disp: 90 tablet, Rfl: 3 .  loratadine (CLARITIN) 10 MG tablet, Take 1 tablet (10 mg total) by mouth daily., Disp: 90 tablet, Rfl: 1 .  metaxalone (SKELAXIN) 800 MG tablet, Take 1 tablet (800 mg total) by mouth 3 (three) times daily as needed for muscle spasms., Disp: 15 tablet, Rfl: 0 .  Multiple Vitamins-Minerals (PRESERVISION AREDS PO), Take 1 capsule by mouth daily. , Disp: , Rfl:  .  RA SENNA PLUS 8.6-50 MG tablet, Take 1 tablet by mouth daily., Disp: , Rfl: 1 .  sertraline (ZOLOFT) 100 MG tablet, TAKE 1 TABLET DAILY, Disp: 90 tablet, Rfl: 3 .  tiotropium (SPIRIVA HANDIHALER) 18 MCG inhalation capsule, Place 1 capsule (18 mcg total) into inhaler and inhale daily., Disp: 90 capsule, Rfl: 0 .  Vitamin D, Cholecalciferol, 1000 units CAPS, Take by mouth daily., Disp: , Rfl:  .  amLODipine (NORVASC) 10 MG tablet, take 1 tablet by mouth once daily (Patient not taking: Reported on 09/01/2016), Disp: 90 tablet, Rfl: 3 .  amoxicillin-clavulanate (AUGMENTIN)  875-125 MG tablet, Take 1 tablet by mouth 2 (two) times daily. (Patient not taking: Reported on 09/01/2016), Disp: 14 tablet, Rfl: 0 .  ascorbic acid (VITAMIN C) 250 MG tablet, Take 1 tablet by mouth daily., Disp: , Rfl:  .  clobetasol (TEMOVATE) 0.05 % external solution, apply to affected area twice a day AVOID FACE, GROIN, UNDERARM, Disp: , Rfl: 0 .  nitroGLYCERIN (NITROSTAT) 0.4 MG SL tablet, Place 1 tablet (0.4 mg total) under the tongue every 5 (five) minutes as needed. (Patient not taking: Reported on 09/01/2016), Disp: 25 tablet, Rfl: 6 .  NON FORMULARY, Eye injection as directed Right eye., Disp: , Rfl:  .  oxyCODONE-acetaminophen  (PERCOCET) 10-325 MG tablet, Take 1 tablet by mouth every 8 (eight) hours as needed for pain., Disp: , Rfl:   Review of Systems  Constitutional: Positive for fatigue.  HENT: Positive for rhinorrhea. Negative for congestion, ear pain, postnasal drip, sinus pain, sinus pressure, sneezing, sore throat and tinnitus.   Respiratory: Negative for cough, chest tightness, shortness of breath and wheezing.   Cardiovascular: Negative for chest pain, palpitations and leg swelling.  Gastrointestinal: Positive for abdominal pain and constipation.  Neurological: Negative for dizziness and headaches.    Social History  Substance Use Topics  . Smoking status: Former Research scientist (life sciences)  . Smokeless tobacco: Never Used     Comment: quit 50 years ago  . Alcohol use No   Objective:   BP 130/70 (BP Location: Left Arm, Patient Position: Sitting, Cuff Size: Normal)   Pulse 78   Temp 97.7 F (36.5 C) (Oral)   Resp 16   Wt 136 lb 9.6 oz (62 kg)   SpO2 95%   BMI 20.17 kg/m     Physical Exam  Constitutional: She appears well-developed and well-nourished. No distress.  Neck: Normal range of motion. Neck supple. No JVD present. No tracheal deviation present. No thyromegaly present.  Cardiovascular: Normal rate, regular rhythm and normal heart sounds.  Exam reveals no gallop and no friction rub.   No murmur heard. Pulmonary/Chest: Effort normal and breath sounds normal. No respiratory distress. She has no wheezes. She has no rales.  Abdominal: Soft. Bowel sounds are normal. There is no tenderness.  Lymphadenopathy:    She has no cervical adenopathy.  Skin: She is not diaphoretic.  Vitals reviewed.      Assessment & Plan:     1. Hyperkalemia Will recheck labs. Potassium was previously 5.5 and then 5.6 when checked with Endocrine. Will recheck labs and f/u pending results.  - Comprehensive Metabolic Panel (CMET)  2. Elevated serum creatinine Previous elevation of creatinine as well on labs from 07/2016. Suspect  dehydration. Will check labs as below.  - Comprehensive Metabolic Panel (CMET)  3. Leukocytosis, unspecified type Previous elevation. Will recheck for clearance.  - CBC w/Diff/Platelet  4. Therapeutic opioid induced constipation Sample medication for Movantik given to patient to try. I have advised her to use 1/2 tab daily. A 24 day supply was given. She is to call if symptoms worsen. I will see her back in 4 weeks to recheck her constipation and see how she is doing with this medication. She is to hold her other laxative at this time. May restart and discontinue Movantik if unsuccessful.   5. Chronic seasonal allergic rhinitis due to other allergen Continue loratadine and flonase at this time.        Mar Daring, PA-C  [facility]

## 2016-09-08 DIAGNOSIS — M9905 Segmental and somatic dysfunction of pelvic region: Secondary | ICD-10-CM | POA: Diagnosis not present

## 2016-09-08 DIAGNOSIS — M5136 Other intervertebral disc degeneration, lumbar region: Secondary | ICD-10-CM | POA: Diagnosis not present

## 2016-09-08 DIAGNOSIS — M5431 Sciatica, right side: Secondary | ICD-10-CM | POA: Diagnosis not present

## 2016-09-08 DIAGNOSIS — M9903 Segmental and somatic dysfunction of lumbar region: Secondary | ICD-10-CM | POA: Diagnosis not present

## 2016-09-11 ENCOUNTER — Encounter: Payer: Self-pay | Admitting: Podiatry

## 2016-09-11 ENCOUNTER — Ambulatory Visit (INDEPENDENT_AMBULATORY_CARE_PROVIDER_SITE_OTHER): Payer: Medicare Other | Admitting: Podiatry

## 2016-09-11 DIAGNOSIS — M9903 Segmental and somatic dysfunction of lumbar region: Secondary | ICD-10-CM | POA: Diagnosis not present

## 2016-09-11 DIAGNOSIS — B351 Tinea unguium: Secondary | ICD-10-CM

## 2016-09-11 DIAGNOSIS — M79674 Pain in right toe(s): Secondary | ICD-10-CM

## 2016-09-11 DIAGNOSIS — M79609 Pain in unspecified limb: Principal | ICD-10-CM

## 2016-09-11 DIAGNOSIS — M79675 Pain in left toe(s): Secondary | ICD-10-CM

## 2016-09-11 DIAGNOSIS — M5431 Sciatica, right side: Secondary | ICD-10-CM | POA: Diagnosis not present

## 2016-09-11 DIAGNOSIS — Q828 Other specified congenital malformations of skin: Secondary | ICD-10-CM

## 2016-09-11 DIAGNOSIS — M5136 Other intervertebral disc degeneration, lumbar region: Secondary | ICD-10-CM | POA: Diagnosis not present

## 2016-09-11 DIAGNOSIS — M9905 Segmental and somatic dysfunction of pelvic region: Secondary | ICD-10-CM | POA: Diagnosis not present

## 2016-09-11 NOTE — Progress Notes (Signed)
She presents today with chief complaint of painful elongated toenails and calluses.  Objective: Vital signs are stable she is alert and oriented 3. Tenderness along thick yellow dystrophic mycotic. Porokeratosis plantar aspect of the foot. No open lesions or wounds are noted.  Assessment: Pain and limb secondary to onychomycosis and porokeratosis.  Plan: Debridement of toenails and porokeratosis. Follow up with her in 3 months.

## 2016-09-12 DIAGNOSIS — J449 Chronic obstructive pulmonary disease, unspecified: Secondary | ICD-10-CM | POA: Diagnosis not present

## 2016-09-13 DIAGNOSIS — R7989 Other specified abnormal findings of blood chemistry: Secondary | ICD-10-CM | POA: Diagnosis not present

## 2016-09-13 DIAGNOSIS — D72829 Elevated white blood cell count, unspecified: Secondary | ICD-10-CM | POA: Diagnosis not present

## 2016-09-13 DIAGNOSIS — E875 Hyperkalemia: Secondary | ICD-10-CM | POA: Diagnosis not present

## 2016-09-14 LAB — COMPREHENSIVE METABOLIC PANEL
A/G RATIO: 1.6 (ref 1.2–2.2)
ALK PHOS: 92 IU/L (ref 39–117)
ALT: 7 IU/L (ref 0–32)
AST: 16 IU/L (ref 0–40)
Albumin: 3.9 g/dL (ref 3.2–4.6)
BILIRUBIN TOTAL: 0.3 mg/dL (ref 0.0–1.2)
BUN/Creatinine Ratio: 30 — ABNORMAL HIGH (ref 12–28)
BUN: 32 mg/dL (ref 10–36)
CHLORIDE: 104 mmol/L (ref 96–106)
CO2: 19 mmol/L (ref 18–29)
Calcium: 8.7 mg/dL (ref 8.7–10.3)
Creatinine, Ser: 1.07 mg/dL — ABNORMAL HIGH (ref 0.57–1.00)
GFR calc Af Amer: 53 mL/min/{1.73_m2} — ABNORMAL LOW (ref >59)
GFR calc non Af Amer: 46 mL/min/{1.73_m2} — ABNORMAL LOW (ref >59)
GLUCOSE: 76 mg/dL (ref 65–99)
Globulin, Total: 2.5 g/dL (ref 1.5–4.5)
POTASSIUM: 4.3 mmol/L (ref 3.5–5.2)
Sodium: 142 mmol/L (ref 134–144)
Total Protein: 6.4 g/dL (ref 6.0–8.5)

## 2016-09-14 LAB — CBC WITH DIFFERENTIAL/PLATELET
BASOS ABS: 0 10*3/uL (ref 0.0–0.2)
Basos: 0 %
EOS (ABSOLUTE): 0.2 10*3/uL (ref 0.0–0.4)
Eos: 3 %
Hematocrit: 35.8 % (ref 34.0–46.6)
Hemoglobin: 11.4 g/dL (ref 11.1–15.9)
IMMATURE GRANS (ABS): 0 10*3/uL (ref 0.0–0.1)
Immature Granulocytes: 0 %
LYMPHS: 25 %
Lymphocytes Absolute: 1.8 10*3/uL (ref 0.7–3.1)
MCH: 28.8 pg (ref 26.6–33.0)
MCHC: 31.8 g/dL (ref 31.5–35.7)
MCV: 90 fL (ref 79–97)
Monocytes Absolute: 0.5 10*3/uL (ref 0.1–0.9)
Monocytes: 7 %
NEUTROS PCT: 65 %
Neutrophils Absolute: 4.6 10*3/uL (ref 1.4–7.0)
PLATELETS: 287 10*3/uL (ref 150–379)
RBC: 3.96 x10E6/uL (ref 3.77–5.28)
RDW: 16.2 % — ABNORMAL HIGH (ref 12.3–15.4)
WBC: 7.1 10*3/uL (ref 3.4–10.8)

## 2016-09-22 DIAGNOSIS — M5136 Other intervertebral disc degeneration, lumbar region: Secondary | ICD-10-CM | POA: Diagnosis not present

## 2016-09-22 DIAGNOSIS — M9903 Segmental and somatic dysfunction of lumbar region: Secondary | ICD-10-CM | POA: Diagnosis not present

## 2016-09-22 DIAGNOSIS — M5431 Sciatica, right side: Secondary | ICD-10-CM | POA: Diagnosis not present

## 2016-09-22 DIAGNOSIS — M9905 Segmental and somatic dysfunction of pelvic region: Secondary | ICD-10-CM | POA: Diagnosis not present

## 2016-09-26 DIAGNOSIS — M9903 Segmental and somatic dysfunction of lumbar region: Secondary | ICD-10-CM | POA: Diagnosis not present

## 2016-09-26 DIAGNOSIS — M5136 Other intervertebral disc degeneration, lumbar region: Secondary | ICD-10-CM | POA: Diagnosis not present

## 2016-09-26 DIAGNOSIS — M5431 Sciatica, right side: Secondary | ICD-10-CM | POA: Diagnosis not present

## 2016-09-26 DIAGNOSIS — M9905 Segmental and somatic dysfunction of pelvic region: Secondary | ICD-10-CM | POA: Diagnosis not present

## 2016-09-29 DIAGNOSIS — M5136 Other intervertebral disc degeneration, lumbar region: Secondary | ICD-10-CM | POA: Diagnosis not present

## 2016-09-29 DIAGNOSIS — M9905 Segmental and somatic dysfunction of pelvic region: Secondary | ICD-10-CM | POA: Diagnosis not present

## 2016-09-29 DIAGNOSIS — M9903 Segmental and somatic dysfunction of lumbar region: Secondary | ICD-10-CM | POA: Diagnosis not present

## 2016-09-29 DIAGNOSIS — M5431 Sciatica, right side: Secondary | ICD-10-CM | POA: Diagnosis not present

## 2016-10-02 DIAGNOSIS — M5431 Sciatica, right side: Secondary | ICD-10-CM | POA: Diagnosis not present

## 2016-10-02 DIAGNOSIS — M5136 Other intervertebral disc degeneration, lumbar region: Secondary | ICD-10-CM | POA: Diagnosis not present

## 2016-10-02 DIAGNOSIS — M9903 Segmental and somatic dysfunction of lumbar region: Secondary | ICD-10-CM | POA: Diagnosis not present

## 2016-10-02 DIAGNOSIS — M9905 Segmental and somatic dysfunction of pelvic region: Secondary | ICD-10-CM | POA: Diagnosis not present

## 2016-10-03 DIAGNOSIS — C44329 Squamous cell carcinoma of skin of other parts of face: Secondary | ICD-10-CM | POA: Diagnosis not present

## 2016-10-06 DIAGNOSIS — H353211 Exudative age-related macular degeneration, right eye, with active choroidal neovascularization: Secondary | ICD-10-CM | POA: Diagnosis not present

## 2016-10-17 DIAGNOSIS — M5431 Sciatica, right side: Secondary | ICD-10-CM | POA: Diagnosis not present

## 2016-10-17 DIAGNOSIS — M9905 Segmental and somatic dysfunction of pelvic region: Secondary | ICD-10-CM | POA: Diagnosis not present

## 2016-10-17 DIAGNOSIS — M5136 Other intervertebral disc degeneration, lumbar region: Secondary | ICD-10-CM | POA: Diagnosis not present

## 2016-10-17 DIAGNOSIS — M9903 Segmental and somatic dysfunction of lumbar region: Secondary | ICD-10-CM | POA: Diagnosis not present

## 2016-10-30 DIAGNOSIS — M48062 Spinal stenosis, lumbar region with neurogenic claudication: Secondary | ICD-10-CM | POA: Diagnosis not present

## 2016-10-30 DIAGNOSIS — M5416 Radiculopathy, lumbar region: Secondary | ICD-10-CM | POA: Diagnosis not present

## 2016-10-31 DIAGNOSIS — M9905 Segmental and somatic dysfunction of pelvic region: Secondary | ICD-10-CM | POA: Diagnosis not present

## 2016-10-31 DIAGNOSIS — M5431 Sciatica, right side: Secondary | ICD-10-CM | POA: Diagnosis not present

## 2016-10-31 DIAGNOSIS — M5136 Other intervertebral disc degeneration, lumbar region: Secondary | ICD-10-CM | POA: Diagnosis not present

## 2016-10-31 DIAGNOSIS — M9903 Segmental and somatic dysfunction of lumbar region: Secondary | ICD-10-CM | POA: Diagnosis not present

## 2016-11-01 NOTE — Progress Notes (Signed)
Cardiology Office Note  Date:  11/03/2016   ID:  ABCDE ONEIL, DOB 1923-09-25, MRN [id number]  PCP:  Mar Daring, PA-C   Chief Complaint  Patient presents with  . other    6 month follow up. Patient c/o SOB. Patient denies chest pain. Meds reviewed verbally with patient.    HPI:  83 year-old woman with  CAD s/p CABG, 3790 diastolic dysfunction,  CEA on the left >10 years ago,   <40% b/l carotid disease ,  mildly dilated left atrium,  mild MR,  presenting today for follow-up evaluation of her shortness of breath, carotid arterial disease  Lives at [facility]  In follow-up today, she reports no chest pain She is having Back pain, having shots to back Chronic SOB, stable Tired  no leg swelling She has not required sublingual nitroglycerin  Previously reported having incontinence and stoolleaking,  loose stools, etiology unclear  Tolerating high-dose Lipitor  Previous carotid ultrasound reviewe with her in detail 01/09/16 S/p CEA on the left <39% disease b/l   lab work from several years ago , none recent  Total chol 142, LDL 62   currently not taking Lasix  EKG shows normal sinus rhythm with rate 60 bpm,  no significant ST or T-wave changes  Other past medical history Previous trips  to the emergency room, had a fall 06/14/2015 went to the emergency room Had joint pain February 16 in her arm went to the emergency room was kept overnight August 2016 had severe pneumonia, long hospital stay  In follow-up today, she reports that her shortness of breath is mild, stable Walks with a walker  She did have one episode of chest pain 07/21/2015 symptoms lasting for 10 minutes Checked her blood pressure, heart rate everything was normal, felt a heaviness in her throat Took aspirin and symptoms seem to resolve on their own  Other past medical history reviewed ECHO 02/11/15 normal study PNA in 01/2015, also with pleural effusion on CXR  Chronic severe  back pain from spinal stenosis. Previous problems with constipation, in the hospital January 2016, CT scan confirming constipation  dizziness in the past, h/o fall with right hip fracture, right arm fracture, 3 ribs injured. She had a long recovery in [place] where the injury occurred. She is now back at [facility]. Previous upper respiratory infection. 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 She has had significant pain, cortisone shots x3 to her hip now wearing a TENS unit for pain. She reports having moderate spinal stenosis.  several admissions to the hospital for abdominal pain. One was in late December 2013, any in February 2014 .  Weight was 147 pounds September 2013, down to 138 pounds in February 2014, down to 131 pounds. Weight has improved on today's visit now up to 143 pounds   hip surgery in March 2014. She had a complicated recovery with bronchitis requiring long period of rehabilitation.  She walks with a cane.  rare abdominal pain. She did take laxatives for a short period of time, now has frequent bowel movements without laxatives.  Minimal prior smoking history chronic mild shortness of breath. Continues to have significant back pain, leg pain  Previous echo in 05/2009 showed normal LV function without valvular abnormalities   stress test was negative for ischemia.   carotid ultrasound last year showed mild bilateral disease   PMH:   has a past medical history of Asthma; Bradycardia; Carotid artery disease ([place]); Coronary artery disease; Degenerative  disc disease; History of hyperkalemia; Hyperlipidemia; Hypertension; Mitral valve prolapse; Norovirus (2014); Osteoporosis; Spinal stenosis in cervical region; and Stroke ([place]) (1980s).  PSH:    Past Surgical History:  Procedure Laterality Date  . ABDOMINAL HYSTERECTOMY    . BLADDER SUSPENSION    . CAROTID ENDARTERECTOMY     left  . CHOLECYSTECTOMY    . CORONARY ARTERY BYPASS GRAFT    . HIP SURGERY  02/2011    right    Current Outpatient Prescriptions  Medication Sig Dispense Refill  . albuterol (PROVENTIL HFA;VENTOLIN HFA) 108 (90 Base) MCG/ACT inhaler Inhale 2 puffs into the lungs every 6 (six) hours as needed for wheezing or shortness of breath. 1 Inhaler 2  . amLODipine (NORVASC) 10 MG tablet take 1 tablet by mouth once daily 90 tablet 3  . ascorbic acid (VITAMIN C) 250 MG tablet Take 1 tablet by mouth daily.    Marland Kitchen aspirin EC 81 MG tablet Take 81 mg by mouth at bedtime. 325 mg during the day for pain as needed    . atorvastatin (LIPITOR) 80 MG tablet TAKE 1 TABLET DAILY 90 tablet 3  . clobetasol (TEMOVATE) 0.05 % external solution apply to affected area twice a day AVOID FACE, GROIN, UNDERARM  0  . denosumab (PROLIA) 60 MG/ML SOLN injection Inject 60 mg into the skin every 6 (six) months. Administer in upper arm, thigh, or abdomen    . fluticasone furoate-vilanterol (BREO ELLIPTA) 100-25 MCG/INH AEPB Inhale 1 puff into the lungs daily.    Marland Kitchen levothyroxine (SYNTHROID, LEVOTHROID) 88 MCG tablet take 1 tablet by mouth once daily 90 tablet 3  . loratadine (CLARITIN) 10 MG tablet Take 1 tablet (10 mg total) by mouth daily. 90 tablet 1  . metaxalone (SKELAXIN) 800 MG tablet Take 1 tablet (800 mg total) by mouth 3 (three) times daily as needed for muscle spasms. 15 tablet 0  . Multiple Vitamins-Minerals (PRESERVISION AREDS PO) Take 1 capsule by mouth daily.     . nitroGLYCERIN (NITROSTAT) 0.4 MG SL tablet Place 1 tablet (0.4 mg total) under the tongue every 5 (five) minutes as needed. 25 tablet 6  . NON FORMULARY Eye injection as directed Right eye.    Marland Kitchen oxyCODONE-acetaminophen (PERCOCET) 10-325 MG tablet Take 1 tablet by mouth every 8 (eight) hours as needed for pain.    Marland Kitchen RA SENNA PLUS 8.6-50 MG tablet Take 1 tablet by mouth daily.  1  . sertraline (ZOLOFT) 100 MG tablet TAKE 1 TABLET DAILY 90 tablet 3  . tiotropium (SPIRIVA HANDIHALER) 18 MCG inhalation capsule Place 1 capsule (18 mcg total) into  inhaler and inhale daily. 90 capsule 0  . Vitamin D, Cholecalciferol, 1000 units CAPS Take by mouth daily.     No current facility-administered medications for this visit.      Allergies:   Sulfa antibiotics and Sulfonamide derivatives   Social History:  The patient  reports that she has quit smoking. She has never used smokeless tobacco. She reports that she does not drink alcohol or use drugs.   Family History:   family history includes Stroke in her father; Throat cancer in her mother.    Review of Systems: Review of Systems  Constitutional: Negative.   Respiratory: Positive for shortness of breath.   Cardiovascular: Negative.   Gastrointestinal: Negative.   Musculoskeletal: Positive for back pain.       Gait instability  Neurological: Negative.   Psychiatric/Behavioral: Negative.   All other systems reviewed and are negative.  PHYSICAL EXAM: VS:  BP 132/60 (BP Location: Left Arm, Patient Position: Sitting, Cuff Size: Normal)   Pulse 60   Ht 5\' 5"  (1.651 m)   Wt 132 lb (59.9 kg)   BMI 21.97 kg/m  , BMI Body mass index is 21.97 kg/m. GEN: Well nourished, well developed, in no acute distress  HEENT: normal  Neck: no JVD, carotid bruits, or masses Cardiac: RRR; no murmurs, rubs, or gallops,no edema  Respiratory:  clear to auscultation bilaterally, crackles in the left base, normal work of breathing GI: soft, nontender, nondistended, + BS MS: no deformity or atrophy  Skin: warm and dry, no rash Neuro:  Strength and sensation are intact Psych: euthymic mood, full affect    Recent Labs: 09/13/2016: ALT 7; BUN 32; Creatinine, Ser 1.07; Platelets 287; Potassium 4.3; Sodium 142    Lipid Panel Lab Results  Component Value Date   CHOL 142 05/08/2014   HDL 57 05/08/2014   LDLCALC 62 05/08/2014   TRIG 114 05/08/2014      Wt Readings from Last 3 Encounters:  11/03/16 132 lb (59.9 kg)  09/01/16 136 lb 9.6 oz (62 kg)  08/04/16 138 lb 12.8 oz (63 kg)        ASSESSMENT AND PLAN:  Essential hypertension - Plan: EKG 12-Lead Blood pressure is well controlled on today's visit. No changes made to the medications.  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 No further workup at this time. Continue current medication regimen.Previously with atypical chest pain , none on today's visit    Pulmonary fibrosis ([place]) Confined to the left lower lobe. She does have history of previous pneumonia 2016  Denies significant progression of her shortness of breath   Total encounter time more than 25 minutes  Greater than 50% was spent in counseling and coordination of care with the patient  Disposition:   F/U  12 months   Orders Placed This Encounter  Procedures  . EKG 12-Lead     Signed, Esmond Plants, M.D., Ph.D. 11/03/2016  [place], [place]

## 2016-11-03 ENCOUNTER — Other Ambulatory Visit: Payer: Self-pay | Admitting: Physician Assistant

## 2016-11-03 ENCOUNTER — Ambulatory Visit (INDEPENDENT_AMBULATORY_CARE_PROVIDER_SITE_OTHER): Payer: Medicare Other | Admitting: Cardiovascular Disease

## 2016-11-03 ENCOUNTER — Encounter: Payer: Self-pay | Admitting: Cardiovascular Disease

## 2016-11-03 VITALS — BP 132/60 | HR 60 | Ht 65.0 in | Wt 132.0 lb

## 2016-11-03 DIAGNOSIS — I6523 Occlusion and stenosis of bilateral carotid arteries: Secondary | ICD-10-CM | POA: Diagnosis not present

## 2016-11-03 DIAGNOSIS — E782 Mixed hyperlipidemia: Secondary | ICD-10-CM

## 2016-11-03 DIAGNOSIS — I209 Angina pectoris, unspecified: Secondary | ICD-10-CM | POA: Diagnosis not present

## 2016-11-03 DIAGNOSIS — I10 Essential (primary) hypertension: Secondary | ICD-10-CM | POA: Diagnosis not present

## 2016-11-03 DIAGNOSIS — I25718 Atherosclerosis of autologous vein coronary artery bypass graft(s) with other forms of angina pectoris: Secondary | ICD-10-CM | POA: Diagnosis not present

## 2016-11-03 DIAGNOSIS — J432 Centrilobular emphysema: Secondary | ICD-10-CM | POA: Diagnosis not present

## 2016-11-03 MED ORDER — SERTRALINE HCL 100 MG PO TABS: 100.0000 mg | ORAL_TABLET | Freq: Every day | ORAL | 3 refills | Status: DC

## 2016-11-03 NOTE — Telephone Encounter (Signed)
[facility] faxed a request for the following medication. Thanks CC  sertraline (ZOLOFT) 100 MG tablet  *Take 1 tablet daily.

## 2016-11-03 NOTE — Patient Instructions (Addendum)

## 2016-11-14 DIAGNOSIS — M9905 Segmental and somatic dysfunction of pelvic region: Secondary | ICD-10-CM | POA: Diagnosis not present

## 2016-11-14 DIAGNOSIS — M5136 Other intervertebral disc degeneration, lumbar region: Secondary | ICD-10-CM | POA: Diagnosis not present

## 2016-11-14 DIAGNOSIS — M5431 Sciatica, right side: Secondary | ICD-10-CM | POA: Diagnosis not present

## 2016-11-14 DIAGNOSIS — M9903 Segmental and somatic dysfunction of lumbar region: Secondary | ICD-10-CM | POA: Diagnosis not present

## 2016-11-23 DIAGNOSIS — K5909 Other constipation: Secondary | ICD-10-CM | POA: Diagnosis not present

## 2016-11-23 DIAGNOSIS — R1031 Right lower quadrant pain: Secondary | ICD-10-CM | POA: Diagnosis not present

## 2016-11-28 DIAGNOSIS — M5136 Other intervertebral disc degeneration, lumbar region: Secondary | ICD-10-CM | POA: Diagnosis not present

## 2016-11-28 DIAGNOSIS — M9905 Segmental and somatic dysfunction of pelvic region: Secondary | ICD-10-CM | POA: Diagnosis not present

## 2016-11-28 DIAGNOSIS — M9903 Segmental and somatic dysfunction of lumbar region: Secondary | ICD-10-CM | POA: Diagnosis not present

## 2016-11-28 DIAGNOSIS — M5431 Sciatica, right side: Secondary | ICD-10-CM | POA: Diagnosis not present

## 2016-12-06 ENCOUNTER — Ambulatory Visit: Payer: Medicare Other | Admitting: Podiatry

## 2016-12-06 DIAGNOSIS — M5416 Radiculopathy, lumbar region: Secondary | ICD-10-CM | POA: Diagnosis not present

## 2016-12-06 DIAGNOSIS — M48062 Spinal stenosis, lumbar region with neurogenic claudication: Secondary | ICD-10-CM | POA: Diagnosis not present

## 2016-12-06 DIAGNOSIS — M5136 Other intervertebral disc degeneration, lumbar region: Secondary | ICD-10-CM | POA: Diagnosis not present

## 2016-12-14 DIAGNOSIS — M9905 Segmental and somatic dysfunction of pelvic region: Secondary | ICD-10-CM | POA: Diagnosis not present

## 2016-12-14 DIAGNOSIS — M9903 Segmental and somatic dysfunction of lumbar region: Secondary | ICD-10-CM | POA: Diagnosis not present

## 2016-12-14 DIAGNOSIS — M5431 Sciatica, right side: Secondary | ICD-10-CM | POA: Diagnosis not present

## 2016-12-14 DIAGNOSIS — M5136 Other intervertebral disc degeneration, lumbar region: Secondary | ICD-10-CM | POA: Diagnosis not present

## 2016-12-18 ENCOUNTER — Ambulatory Visit (INDEPENDENT_AMBULATORY_CARE_PROVIDER_SITE_OTHER): Payer: Medicare Other | Admitting: Podiatry

## 2016-12-18 ENCOUNTER — Encounter: Payer: Self-pay | Admitting: Podiatry

## 2016-12-18 DIAGNOSIS — M79609 Pain in unspecified limb: Secondary | ICD-10-CM

## 2016-12-18 DIAGNOSIS — Q828 Other specified congenital malformations of skin: Secondary | ICD-10-CM

## 2016-12-18 DIAGNOSIS — B351 Tinea unguium: Secondary | ICD-10-CM

## 2016-12-18 NOTE — Progress Notes (Signed)
She presents a chief complaint of painful elongated toenails and calluses.  Objective: Vital signs are stable she is alert and oriented 3 tenderness along the talar dystrophic with mycotic multiple porokeratosis plantar aspect bilateral foot and interdigitally.  Assessment: A Ria Comment noted onychomycosis and hyperkeratosis.  Plan: Debridement of toenails 1 through 5 bilateral. Debridement of all reactive hyperkeratosis bilateral.

## 2016-12-21 ENCOUNTER — Emergency Department
Admission: EM | Admit: 2016-12-21 | Discharge: 2016-12-21 | Disposition: A | Discharge status: Home / Self Care | Payer: Medicare Other | Attending: Emergency Medicine | Admitting: Emergency Medicine

## 2016-12-21 ENCOUNTER — Emergency Department: Payer: Medicare Other

## 2016-12-21 ENCOUNTER — Encounter: Payer: Self-pay | Admitting: *Deleted

## 2016-12-21 DIAGNOSIS — E079 Disorder of thyroid, unspecified: Secondary | ICD-10-CM | POA: Insufficient documentation

## 2016-12-21 DIAGNOSIS — R0789 Other chest pain: Secondary | ICD-10-CM

## 2016-12-21 DIAGNOSIS — I10 Essential (primary) hypertension: Secondary | ICD-10-CM | POA: Insufficient documentation

## 2016-12-21 DIAGNOSIS — J45909 Unspecified asthma, uncomplicated: Secondary | ICD-10-CM | POA: Diagnosis not present

## 2016-12-21 DIAGNOSIS — J449 Chronic obstructive pulmonary disease, unspecified: Secondary | ICD-10-CM | POA: Diagnosis not present

## 2016-12-21 DIAGNOSIS — R109 Unspecified abdominal pain: Secondary | ICD-10-CM | POA: Diagnosis not present

## 2016-12-21 DIAGNOSIS — Z87891 Personal history of nicotine dependence: Secondary | ICD-10-CM | POA: Diagnosis not present

## 2016-12-21 DIAGNOSIS — R1030 Lower abdominal pain, unspecified: Secondary | ICD-10-CM

## 2016-12-21 DIAGNOSIS — Z7982 Long term (current) use of aspirin: Secondary | ICD-10-CM | POA: Insufficient documentation

## 2016-12-21 DIAGNOSIS — I2510 Atherosclerotic heart disease of native coronary artery without angina pectoris: Secondary | ICD-10-CM | POA: Diagnosis not present

## 2016-12-21 DIAGNOSIS — Z79899 Other long term (current) drug therapy: Secondary | ICD-10-CM | POA: Diagnosis not present

## 2016-12-21 DIAGNOSIS — R208 Other disturbances of skin sensation: Secondary | ICD-10-CM | POA: Diagnosis not present

## 2016-12-21 DIAGNOSIS — R079 Chest pain, unspecified: Secondary | ICD-10-CM | POA: Diagnosis not present

## 2016-12-21 LAB — COMPREHENSIVE METABOLIC PANEL
ALK PHOS: 89 U/L (ref 38–126)
ALT: 9 U/L — AB (ref 14–54)
AST: 20 U/L (ref 15–41)
Albumin: 3.8 g/dL (ref 3.5–5.0)
Anion gap: 8 (ref 5–15)
BUN: 33 mg/dL — ABNORMAL HIGH (ref 6–20)
CALCIUM: 9.1 mg/dL (ref 8.9–10.3)
CO2: 25 mmol/L (ref 22–32)
Chloride: 105 mmol/L (ref 101–111)
Creatinine, Ser: 1.09 mg/dL — ABNORMAL HIGH (ref 0.44–1.00)
GFR calc Af Amer: 50 mL/min — ABNORMAL LOW (ref >60)
GFR calc non Af Amer: 43 mL/min — ABNORMAL LOW (ref >60)
GLUCOSE: 114 mg/dL — AB (ref 65–99)
Potassium: 4.4 mmol/L (ref 3.5–5.1)
SODIUM: 138 mmol/L (ref 135–145)
Total Bilirubin: 0.6 mg/dL (ref 0.3–1.2)
Total Protein: 7.1 g/dL (ref 6.5–8.1)

## 2016-12-21 LAB — URINALYSIS, COMPLETE (UACMP) WITH MICROSCOPIC
Bacteria, UA: NONE SEEN
Bilirubin Urine: NEGATIVE
Glucose, UA: NEGATIVE mg/dL
HGB URINE DIPSTICK: NEGATIVE
Ketones, ur: NEGATIVE mg/dL
Leukocytes, UA: NEGATIVE
Nitrite: NEGATIVE
Protein, ur: NEGATIVE mg/dL
RBC / HPF: NONE SEEN RBC/hpf (ref 0–5)
Specific Gravity, Urine: 1.008 (ref 1.005–1.030)
pH: 5 (ref 5.0–8.0)

## 2016-12-21 LAB — CBC
HCT: 35.6 % (ref 35.0–47.0)
HEMOGLOBIN: 11.7 g/dL — AB (ref 12.0–16.0)
MCH: 28.8 pg (ref 26.0–34.0)
MCHC: 32.8 g/dL (ref 32.0–36.0)
MCV: 87.7 fL (ref 80.0–100.0)
PLATELETS: 302 10*3/uL (ref 150–440)
RBC: 4.06 MIL/uL (ref 3.80–5.20)
RDW: 15.7 % — ABNORMAL HIGH (ref 11.5–14.5)
WBC: 6.3 10*3/uL (ref 3.6–11.0)

## 2016-12-21 LAB — LIPASE, BLOOD: Lipase: 22 U/L (ref 11–51)

## 2016-12-21 MED ORDER — IOPAMIDOL (ISOVUE-300) INJECTION 61%
100.0000 mL | Freq: Once | INTRAVENOUS | Status: AC | PRN
Start: 2016-12-21 — End: 2016-12-21
  Administered 2016-12-21: 75 mL via INTRAVENOUS

## 2016-12-21 MED ORDER — IOPAMIDOL (ISOVUE-300) INJECTION 61%
30.0000 mL | Freq: Once | INTRAVENOUS | Status: AC | PRN
  Administered 2016-12-21: 30 mL via ORAL

## 2016-12-21 NOTE — ED Triage Notes (Signed)
Pt to ED reproting upper right quad pain beginning 2 days ago. Tenderness upon palpation. No NVD reported, no fevers. PT reports having had a significant abd hx of surgeries and constipation. Pt was recently placed on medication to help constipation and pt reports is has. Last BM reported to have been today. Dr. Dillard [place] office sent pt in to have CT performed.

## 2016-12-21 NOTE — ED Notes (Signed)
Assisted to and from bedside commode.

## 2016-12-21 NOTE — ED Notes (Signed)
Patient transported to CT 

## 2016-12-21 NOTE — ED Notes (Signed)
Pt drinking oral ct contrast at this time.

## 2016-12-21 NOTE — Discharge Instructions (Signed)
You have been seen in the Emergency Department (ED) for abdominal pain.  Your evaluation did not identify a clear cause of your symptoms but was generally reassuring.  Your labs, chest x-ray, and CT scan of your abdomen/pelvis were all essentially normal and did not identify any specific or acute reasons for your pain.  As we discussed, one thing to watch and consider is the possibility (even if it is a low chance) that you may eventually develop a rash consistent with shingles.  We need to make it clear, you do NOT currently have shingles!  But monitor that area for any developing rash, including redness but especially if you develop some clear blister-like lesions.  Follow up with your primary care provider by early next week for a follow up appointment.    Return to the emergency department if you develop new or worsening symptoms that concern you.  Please follow up as instructed above regarding today?s emergent visit and the symptoms that are bothering you.  Return to the ED if your abdominal pain worsens or fails to improve, you develop bloody vomiting, bloody diarrhea, you are unable to tolerate fluids due to vomiting, fever greater than 101, or other symptoms that concern you.

## 2016-12-21 NOTE — ED Notes (Signed)
Pt gave verbal consent to E-signature once in lobby because she forgot to sign herself out.

## 2016-12-21 NOTE — ED Provider Notes (Signed)
[facility] Emergency Department Provider Note  ____________________________________________   First MD Initiated Contact with Patient 12/21/16 1621     (approximate)  I have reviewed the triage vital signs and the nursing notes.   HISTORY  Chief Complaint Abdominal Pain    HPI Chelsea [name] is a 83 y.o. female with extensive past medical and surgical history who presents by private vehicle for evaluation of both upper and lower abdominal pain.  The lower abdominal pain has been present for an extended period of time, the patient believes probably weeks.  They have recommended a CT scan of her abdomen and pelvis but she has "been putting it off".  She called the clinic today though because in addition to the lower abdominal pain, she is now been having right-sided upper abdominal pain for the last 2 days.  Nothing in particular makes it better and touching the area just under her breasts and around from the anterior chest around to her flank makes it feel worse.  She has had some nausea but that was "a while ago" and no vomiting.  She had some loose stools more than a week ago but that also has resolved.  No constipation.  She denies fever/chills, chest pain, cough, shortness of breath, dysuria.  She called her GI doctors office who recommended she come to the ED and have a CT scan.   Past Medical History:  Diagnosis Date  . Asthma   . Bradycardia    hx of it and fatigue with beta blockade  . Carotid artery disease ([place])    s/p carotid endarterectomy  . Coronary artery disease    a. s/p 2 vessel CABG 1989; b. echo 2003: nl; c. cath 2006: patent grafts, EF 65%; d. nuclear stress test 2007: no ischemia, EF 81%  . Degenerative disc disease   . History of hyperkalemia   . Hyperlipidemia   . Hypertension   . Mitral valve prolapse   . Norovirus 2014  . Osteoporosis   . Spinal stenosis in cervical region   . Stroke [facility]) 1980s   left brain    Patient  Active Problem List   Diagnosis Date Noted  . Low BP 11/11/2015  . Polyarthritis 08/05/2015  . Pulmonary fibrosis ([place]) 05/11/2015  . SOB (shortness of breath)   . Weakness   . 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 Peripheral blood vessel disorder ([place]) 02/09/2015  . 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 OP (osteoporosis) 02/09/2015  . Billowing mitral valve 02/09/2015  . Degeneration macular 02/09/2015  . Hypercholesteremia 02/09/2015  . Adult hypothyroidism 02/09/2015  . Cannot sleep 02/09/2015  . 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 Presence of aortocoronary bypass graft 02/09/2015  . 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 Esophagitis, reflux 02/09/2015  . Diverticulitis 02/09/2015  . Colon, diverticulosis 02/09/2015  . Constipation due to opioid therapy 02/09/2015  . CAD in native artery 02/09/2015  . Allergic rhinitis 02/09/2015  . Back ache 02/09/2015  . COPD (chronic obstructive pulmonary disease) ([place]) 01/15/2015  . GERD (gastroesophageal reflux disease) 12/23/2014  . Spinal stenosis at L4-L5 level 12/16/2014  . Lumbar nerve root compression 12/16/2014  . Compression fracture of lumbar vertebra ([place]) 12/16/2014  . Lumbar canal stenosis 11/04/2013  . Neuritis or radiculitis due to rupture of lumbar intervertebral disc 11/04/2013  . DDD (degenerative disc disease), lumbar 11/04/2013  . Degeneration of intervertebral disc of lumbar region 11/04/2013  . Dermatophytosis of nail 03/31/2013  . Pain in limb 03/31/2013  . HTN (hypertension)  07/24/2011  . Hyperlipidemia 01/31/2010  . Carotid artery stenosis 12/21/2009  . CORONARY ATHEROSLERO AUTOL VEIN BYPASS GRAFT 05/25/2009  . Dyspnea on exertion 05/25/2009    Past Surgical History:  Procedure Laterality Date  . ABDOMINAL HYSTERECTOMY    . BLADDER SUSPENSION    . CAROTID ENDARTERECTOMY     left  . CHOLECYSTECTOMY    . CORONARY ARTERY BYPASS GRAFT    . HIP SURGERY  02/2011   right    Prior to Admission medications     Medication Sig Start Date End Date Taking? Authorizing Provider  albuterol (PROVENTIL HFA;VENTOLIN HFA) 108 (90 Base) MCG/ACT inhaler Inhale 2 puffs into the lungs every 6 (six) hours as needed for wheezing or shortness of breath. 02/07/16   Rockey Situ, Kathlene November, MD  amLODipine (NORVASC) 10 MG tablet take 1 tablet by mouth once daily 07/14/16   Trinna Post, PA-C  ascorbic acid (VITAMIN C) 250 MG tablet Take 1 tablet by mouth daily.    [provider]  aspirin EC 81 MG tablet Take 81 mg by mouth at bedtime. 325 mg during the day for pain as needed    [provider]  atorvastatin (LIPITOR) 80 MG tablet TAKE 1 TABLET DAILY 05/18/16   Minna Merritts, MD  clobetasol (TEMOVATE) 0.05 % external solution apply to affected area twice a day AVOID FACE, GROIN, UNDERARM 11/05/15   [provider]  denosumab (PROLIA) 60 MG/ML SOLN injection Inject 60 mg into the skin every 6 (six) months. Administer in upper arm, thigh, or abdomen    [provider]  fluticasone furoate-vilanterol (BREO ELLIPTA) 100-25 MCG/INH AEPB Inhale 1 puff into the lungs daily.    [provider]  ketoconazole (NIZORAL) 2 % shampoo MASSAGE SHAMPOO ONTO SCALP AND LET SIT FOR 5 MINUTES THEN RINSE OUT as directed by prescriber 12/13/16   [provider]  levothyroxine (SYNTHROID, LEVOTHROID) 88 MCG tablet take 1 tablet by mouth once daily 03/09/16   Mar Daring, PA-C  loratadine (CLARITIN) 10 MG tablet Take 1 tablet (10 mg total) by mouth daily. 07/07/16   Mar Daring, PA-C  metaxalone (SKELAXIN) 800 MG tablet Take 1 tablet (800 mg total) by mouth 3 (three) times daily as needed for muscle spasms. 02/18/15   Hillary Bow, MD  Multiple Vitamins-Minerals (PRESERVISION AREDS PO) Take 1 capsule by mouth daily.     [provider]  nitroGLYCERIN (NITROSTAT) 0.4 MG SL tablet Place 1 tablet (0.4 mg total) under the tongue every 5 (five) minutes as needed. 02/07/16    Minna Merritts, MD  NON FORMULARY Eye injection as directed Right eye.    [provider]  NONFORMULARY OR COMPOUNDED ITEM [facility]  Peripheral Neuropathy Cream- Bupivacaine 1%, Doxepin 3%, Gabapentin 6%, Pentoxifylline 3%, Topiramate 1% Apply 1-2 grams to affected area 3-4 times daily Qty. 120 gm 3 refills    [provider]  oxyCODONE-acetaminophen (PERCOCET) 10-325 MG tablet Take 1 tablet by mouth every 8 (eight) hours as needed for pain.    [provider]  RA SENNA PLUS 8.6-50 MG tablet Take 1 tablet by mouth daily. 06/14/16   [provider]  sertraline (ZOLOFT) 100 MG tablet Take 1 tablet (100 mg total) by mouth daily. 11/03/16   Mar Daring, PA-C  tiotropium (SPIRIVA HANDIHALER) 18 MCG inhalation capsule Place 1 capsule (18 mcg total) into inhaler and inhale daily. 03/19/15   Margarita Rana, MD  Vitamin D, Cholecalciferol, 1000 units CAPS Take by  mouth daily.    [provider]    Allergies Sulfa antibiotics and Sulfonamide derivatives  Family History  Problem Relation Age of Onset  . Throat cancer Mother   . Stroke Father     Social History Social History  Substance Use Topics  . Smoking status: Former Research scientist (life sciences)  . Smokeless tobacco: Never Used     Comment: quit 50 years ago  . Alcohol use No    Review of Systems Constitutional: No fever/chills Eyes: No visual changes. ENT: No sore throat. Cardiovascular: Denies chest pain. Respiratory: Denies shortness of breath. Gastrointestinal: Upper and lower abdominal pain.  Minimal nausea, no vomiting.  No recent diarrhea.  No constipation. Genitourinary: Negative for dysuria. Musculoskeletal: Negative for neck pain.  Negative for back pain. Integumentary: Negative for rash. Neurological: Negative for headaches, focal weakness or numbness.   ____________________________________________   PHYSICAL EXAM:  VITAL SIGNS: ED Triage Vitals  Enc Vitals Group      BP 12/21/16 1453 (!) 142/60     Pulse Rate 12/21/16 1453 81     Resp 12/21/16 1453 16     Temp 12/21/16 1453 98.2 F (36.8 C)     Temp Source 12/21/16 1453 Oral     SpO2 12/21/16 1453 99 %     Weight 12/21/16 1453 59.9 kg (132 lb)     Height 12/21/16 1453 1.753 m (5\' 9" )     Head Circumference --      Peak Flow --      Pain Score 12/21/16 1452 6     Pain Loc --      Pain Edu? --      Excl. in [place]? --     Constitutional: Alert and oriented. Well appearing for her age and in no acute distress. Eyes: Conjunctivae are normal.  Head: Atraumatic. Nose: No congestion/rhinnorhea. Mouth/Throat: Mucous membranes are moist. Neck: No stridor.  No meningeal signs.   Cardiovascular: Normal rate, regular rhythm. Good peripheral circulation. Grossly normal heart sounds. Respiratory: Normal respiratory effort.  No retractions. Lungs CTAB. Gastrointestinal: Soft with diffuse lower abdominal tenderness to palpation.  She also has tenderness to the touch of the upper abdomen and lower right anterior chest wall Musculoskeletal: No lower extremity tenderness nor edema. No gross deformities of extremities. Neurologic:  Normal speech and language. No gross focal neurologic deficits are appreciated.  Skin:  Skin is warm, dry and intact. No rash noted including no vesicular lesions in the dermatomal distribution where she is experiencing her pain.  The area is tender, however, with a mild allodynia Psychiatric: Mood and affect are normal. Speech and behavior are normal.  ____________________________________________   LABS (all labs ordered are listed, but only abnormal results are displayed)  Labs Reviewed  COMPREHENSIVE METABOLIC PANEL - Abnormal; Notable for the following:       Result Value   Glucose, Bld 114 (*)    BUN 33 (*)    Creatinine, Ser 1.09 (*)    ALT 9 (*)    GFR calc non Af Amer 43 (*)    GFR calc Af Amer 50 (*)    All other components within normal limits  CBC - Abnormal; Notable  for the following:    Hemoglobin 11.7 (*)    RDW 15.7 (*)    All other components within normal limits  URINALYSIS, COMPLETE (UACMP) WITH MICROSCOPIC - Abnormal; Notable for the following:    Color, Urine YELLOW (*)    APPearance CLEAR (*)    Squamous Epithelial /  LPF 0-5 (*)    All other components within normal limits  LIPASE, BLOOD   ____________________________________________  EKG  I did order an EKG for the patient to be performed before discharge but it slipped my mind and the patient was discharged before the EKG was obtained by the nurse.  However, the patient is very clearly having lower chest wall pain/allodynia and some chronic lower abdominal pain and her signs and symptoms are in no way consistent with ACS.  No indication to urgently call the patient back to the emergency department. ____________________________________________  RADIOLOGY   Dg Chest 2 View  Result Date: 12/21/2016 CLINICAL DATA:  Right lower chest/ upper abdominal pain EXAM: CHEST  2 VIEW COMPARISON:  04/04/2016 FINDINGS: Scarring in the left lower lung. No focal consolidation. No pleural effusion or pneumothorax. The heart is normal in size. Degenerative changes of the visualized thoracolumbar spine. Median sternotomy. IMPRESSION: No evidence of acute cardiopulmonary disease. Electronically Signed   By: Julian Hy M.D.   On: 12/21/2016 16:53   Ct Abdomen Pelvis W Contrast  Result Date: 12/21/2016 CLINICAL DATA:  83 year old female with acute right abdominal pain for 2 days. EXAM: CT ABDOMEN AND PELVIS WITH CONTRAST TECHNIQUE: Multidetector CT imaging of the abdomen and pelvis was performed using the standard protocol following bolus administration of intravenous contrast. CONTRAST:  70mL ISOVUE-300 IOPAMIDOL (ISOVUE-300) INJECTION 61% COMPARISON:  02/10/2015 CT and prior studies. 03/21/2016 radiographs. FINDINGS: Right total hip replacement obscures detail within the pelvis. Lower chest: No acute  abnormality. Cardiomegaly, coronary artery calcifications, cardiac surgical changes and bilateral lower lung scarring again noted. Hepatobiliary: The liver is unremarkable except for unchanged mild biliary fullness. The patient is status post cholecystectomy. No CBD dilatation. Pancreas: Mildly atrophic without other significant abnormality Spleen: Unremarkable Adrenals/Urinary Tract: The kidneys, adrenal glands and bladder are unremarkable except for bilateral renal atrophy. Stomach/Bowel: There is no evidence of bowel obstruction, definite bowel wall thickening or inflammatory changes. A moderate to large hiatal hernia is again noted. Vascular/Lymphatic: Aortic atherosclerosis. No enlarged abdominal or pelvic lymph nodes. Reproductive: Status post hysterectomy. No adnexal masses. Other: No free fluid, abscess or pneumoperitoneum. A small left inguinal hernia containing fat again identified. Musculoskeletal: No acute bony abnormality or suspicious focal bony lesion. Chronic T11, L1, L2, and L4 compression fractures are again identified. Right total hip arthroplasty noted. IMPRESSION: No evidence of acute abnormality. Moderate to large hiatal hernia again noted. Cardiomegaly and cardiac surgical changes. Chronic thoracic and lumbar spine compression fractures. Aortic Atherosclerosis (ICD10-I70.0). Electronically Signed   By: Margarette Canada M.D.   On: 12/21/2016 18:28    ____________________________________________   PROCEDURES  Critical Care performed: No   Procedure(s) performed:   Procedures   ____________________________________________   INITIAL IMPRESSION / ASSESSMENT AND PLAN / ED COURSE  Pertinent labs & imaging results that were available during my care of the patient were reviewed by me and considered in my medical decision making (see chart for details).  The patient seems to have 2 different processes going on, a chronic lower abdominal pain and a more acute and gradually worsening  right-sided upper abdominal pain.  I will evaluate with a CT scan given her age and numerous abdominal surgeries in the past, but I suspect she will not have an acute or emergent medical condition such as ileus or SBO.  The upper abdominal tenderness seems relatively superficial and includes both her right lower chest wall and the upper abdomen.  It is in a dermatomal distribution and although  there are no vesicular lesions at this time, I suspect she may be experiencing some allodynia from zoster that has not yet erupted.  I discussed this with her briefly but we will proceed also with a two-view chest x-ray to look for the possibility of a right lower or right middle lobe pneumonia as the cause of her discomfort.  She agrees with the plan.  Labs are reassuring at this time.   Clinical Course as of Dec 21 2032  Thu Dec 21, 2016  1706 Reassuring CXR. DG Chest 2 View [CF]  1900 The patient is very comfortable and stable since I evaluated her before the imaging.  I have did it about the reassuring CT results and chest x-ray results as well as her labs are essentially normal.  She has a friend and former nurse with her who is also at [employer].  I again reiterated my concern for the possibility that she may eventually develop shingles even though she does not have it now given the dermatomal distribution of allodynia and pain on the right side of her torso.  She will follow up with her primary care doctor early next week.  I gave my usual and customary return precautions.     [CF]    Clinical Course User Index [CF] Hinda Kehr, MD    ____________________________________________  FINAL CLINICAL IMPRESSION(S) / ED DIAGNOSES  Final diagnoses:  Lower abdominal pain  Allodynia  Right-sided chest wall pain     MEDICATIONS GIVEN DURING THIS VISIT:  Medications  iopamidol (ISOVUE-300) 61 % injection 30 mL (30 mLs Oral Contrast Given 12/21/16 1644)  iopamidol (ISOVUE-300) 61 % injection 100 mL (75  mLs Intravenous Contrast Given 12/21/16 1802)     NEW OUTPATIENT MEDICATIONS STARTED DURING THIS VISIT:  Discharge Medication List as of 12/21/2016  7:12 PM      Discharge Medication List as of 12/21/2016  7:12 PM      Discharge Medication List as of 12/21/2016  7:12 PM       Note:  This document was prepared using Dragon voice recognition software and may include unintentional dictation errors.    Hinda Kehr, MD 12/21/16 2034

## 2016-12-21 NOTE — ED Notes (Signed)
Pt back from CT

## 2016-12-21 NOTE — ED Notes (Addendum)
CT informed that pt is finished with contrast.   Assisted to bathroom and back to bed.

## 2016-12-21 NOTE — ED Notes (Signed)
Patient transported to X-ray 

## 2016-12-21 NOTE — ED Notes (Signed)
epigastric and right sided abdominal pain. Pt reports small BM this AM. Denies NV.

## 2016-12-22 ENCOUNTER — Ambulatory Visit (INDEPENDENT_AMBULATORY_CARE_PROVIDER_SITE_OTHER): Payer: Medicare Other | Admitting: Physician Assistant

## 2016-12-22 ENCOUNTER — Encounter: Payer: Self-pay | Admitting: Physician Assistant

## 2016-12-22 ENCOUNTER — Telehealth: Payer: Self-pay | Admitting: Physician Assistant

## 2016-12-22 VITALS — BP 152/70 | HR 77 | Temp 98.8°F | Resp 16 | Wt 138.0 lb

## 2016-12-22 DIAGNOSIS — R208 Other disturbances of skin sensation: Secondary | ICD-10-CM

## 2016-12-22 DIAGNOSIS — B029 Zoster without complications: Secondary | ICD-10-CM

## 2016-12-22 DIAGNOSIS — L821 Other seborrheic keratosis: Secondary | ICD-10-CM | POA: Diagnosis not present

## 2016-12-22 DIAGNOSIS — I6523 Occlusion and stenosis of bilateral carotid arteries: Secondary | ICD-10-CM | POA: Diagnosis not present

## 2016-12-22 MED ORDER — TRIAMCINOLONE ACETONIDE 0.1 % EX CREA: 1.0000 "application " | TOPICAL_CREAM | Freq: Two times a day (BID) | CUTANEOUS | 0 refills | Status: DC

## 2016-12-22 MED ORDER — VALACYCLOVIR HCL 1 G PO TABS: 1000.0000 mg | ORAL_TABLET | Freq: Three times a day (TID) | ORAL | 0 refills | Status: DC

## 2016-12-22 NOTE — Patient Instructions (Signed)
Shingles Shingles, which is also known as herpes zoster, is an infection that causes a painful skin rash and fluid-filled blisters. Shingles is not related to genital herpes, which is a sexually transmitted infection. Shingles only develops in people who:  Have had chickenpox.  Have received the chickenpox vaccine. (This is rare.)  What are the causes? Shingles is caused by varicella-zoster virus (VZV). This is the same virus that causes chickenpox. After exposure to VZV, the virus stays in the body in an inactive (dormant) state. Shingles develops if the virus reactivates. This can happen many years after the initial exposure to VZV. It is not known what causes this virus to reactivate. What increases the risk? People who have had chickenpox or received the chickenpox vaccine are at risk for shingles. Infection is more common in people who:  Are older than age 50.  Have a weakened defense (immune) system, such as those with HIV, AIDS, or cancer.  Are taking medicines that weaken the immune system, such as transplant medicines.  Are under great stress.  What are the signs or symptoms? Early symptoms of this condition include itching, tingling, and pain in an area on your skin. Pain may be described as burning, stabbing, or throbbing. A few days or weeks after symptoms start, a painful red rash appears, usually on one side of the body in a bandlike or beltlike pattern. The rash eventually turns into fluid-filled blisters that break open, scab over, and dry up in about 2-3 weeks. At any time during the infection, you may also develop:  A fever.  Chills.  A headache.  An upset stomach.  How is this diagnosed? This condition is diagnosed with a skin exam. Sometimes, skin or fluid samples are taken from the blisters before a diagnosis is made. These samples are examined under a microscope or sent to a lab for testing. How is this treated? There is no specific cure for this condition.  Your health care provider will probably prescribe medicines to help you manage pain, recover more quickly, and avoid long-term problems. Medicines may include:  Antiviral drugs.  Anti-inflammatory drugs.  Pain medicines.  If the area involved is on your face, you may be referred to a specialist, such as an eye doctor (ophthalmologist) or an ear, nose, and throat (ENT) doctor to help you avoid eye problems, chronic pain, or disability. Follow these instructions at home: Medicines  Take medicines only as directed by your health care provider.  Apply an anti-itch or numbing cream to the affected area as directed by your health care provider. Blister and Rash Care  Take a cool bath or apply cool compresses to the area of the rash or blisters as directed by your health care provider. This may help with pain and itching.  Keep your rash covered with a loose bandage (dressing). Wear loose-fitting clothing to help ease the pain of material rubbing against the rash.  Keep your rash and blisters clean with mild soap and cool water or as directed by your health care provider.  Check your rash every day for signs of infection. These include redness, swelling, and pain that lasts or increases.  Do not pick your blisters.  Do not scratch your rash. General instructions  Rest as directed by your health care provider.  Keep all follow-up visits as directed by your health care provider. This is important.  Until your blisters scab over, your infection can cause chickenpox in people who have never had it or been vaccinated   against it. To prevent this from happening, avoid contact with other people, especially: ? Babies. ? Pregnant women. ? Children who have eczema. ? Elderly people who have transplants. ? People who have chronic illnesses, such as leukemia or AIDS. Contact a health care provider if:  Your pain is not relieved with prescribed medicines.  Your pain does not get better after  the rash heals.  Your rash looks infected. Signs of infection include redness, swelling, and pain that lasts or increases. Get help right away if:  The rash is on your face or nose.  You have facial pain, pain around your eye area, or loss of feeling on one side of your face.  You have ear pain or you have ringing in your ear.  You have loss of taste.  Your condition gets worse. This information is not intended to replace advice given to you by your health care provider. Make sure you discuss any questions you have with your health care provider. Document Released: 06/05/2005 Document Revised: 01/30/2016 Document Reviewed: 04/16/2014 Elsevier Interactive Patient Education  2017 [employer]  

## 2016-12-22 NOTE — Progress Notes (Signed)
Patient: Chelsea [name] Female    DOB: August 29, 1923   83 y.o.   MRN: [id number] Visit Date: 12/22/2016  Today's Provider: Mar Daring, PA-C   No chief complaint on file.  Subjective:    HPI  Follow Up ER Visit  Patient is here for ER follow up.  She was recently seen at [facility] for lower abdominal pain, Allodynia and Right-sided chest pain on 12/21/16. Treatment for this included CT and xray and labs-Normal She reports excellent compliance with treatment. She reports this condition is Unchanged.  ------------------------------------------------------------------------------------ Patient reports that she took Tylenol 500 mg at 10:30 am and she took Metaxalone 800 mg 1-11pm on Thursday. She is still having the pain along the right chest wall from midline epigastric region all the way around to her back in a dermatomal fashion. She does not have any vesicular or red lesions present at this time. She has never had shingles before. She has had chicken pox.   She does also have known compression fractures in her thoracic and lumbar spine. She is followed by Dr. Sharlet Salina.     Allergies  Allergen Reactions  . Sulfa Antibiotics     Other reaction(s): Dizziness  . Sulfonamide Derivatives Other (See Comments)    Reaction:  Dizziness      Current Outpatient Prescriptions:  .  albuterol (PROVENTIL HFA;VENTOLIN HFA) 108 (90 Base) MCG/ACT inhaler, Inhale 2 puffs into the lungs every 6 (six) hours as needed for wheezing or shortness of breath., Disp: 1 Inhaler, Rfl: 2 .  amLODipine (NORVASC) 10 MG tablet, take 1 tablet by mouth once daily, Disp: 90 tablet, Rfl: 3 .  ascorbic acid (VITAMIN C) 250 MG tablet, Take 1 tablet by mouth daily., Disp: , Rfl:  .  aspirin EC 81 MG tablet, Take 81 mg by mouth at bedtime. 325 mg during the day for pain as needed, Disp: , Rfl:  .  atorvastatin (LIPITOR) 80 MG tablet, TAKE 1 TABLET DAILY, Disp: 90 tablet, Rfl: 3 .  clobetasol (TEMOVATE) 0.05 %  external solution, apply to affected area twice a day AVOID FACE, GROIN, UNDERARM, Disp: , Rfl: 0 .  denosumab (PROLIA) 60 MG/ML SOLN injection, Inject 60 mg into the skin every 6 (six) months. Administer in upper arm, thigh, or abdomen, Disp: , Rfl:  .  fluticasone furoate-vilanterol (BREO ELLIPTA) 100-25 MCG/INH AEPB, Inhale 1 puff into the lungs daily., Disp: , Rfl:  .  ketoconazole (NIZORAL) 2 % shampoo, MASSAGE SHAMPOO ONTO SCALP AND LET SIT FOR 5 MINUTES THEN RINSE OUT as directed by prescriber, Disp: , Rfl: 0 .  levothyroxine (SYNTHROID, LEVOTHROID) 88 MCG tablet, take 1 tablet by mouth once daily, Disp: 90 tablet, Rfl: 3 .  loratadine (CLARITIN) 10 MG tablet, Take 1 tablet (10 mg total) by mouth daily., Disp: 90 tablet, Rfl: 1 .  metaxalone (SKELAXIN) 800 MG tablet, Take 1 tablet (800 mg total) by mouth 3 (three) times daily as needed for muscle spasms., Disp: 15 tablet, Rfl: 0 .  Multiple Vitamins-Minerals (PRESERVISION AREDS PO), Take 1 capsule by mouth daily. , Disp: , Rfl:  .  nitroGLYCERIN (NITROSTAT) 0.4 MG SL tablet, Place 1 tablet (0.4 mg total) under the tongue every 5 (five) minutes as needed., Disp: 25 tablet, Rfl: 6 .  NON FORMULARY, Eye injection as directed Right eye., Disp: , Rfl:  .  NONFORMULARY OR COMPOUNDED ITEM, [facility]  Peripheral Neuropathy Cream- Bupivacaine 1%, Doxepin 3%, Gabapentin 6%, Pentoxifylline 3%, Topiramate 1%  Apply 1-2 grams to affected area 3-4 times daily Qty. 120 gm 3 refills, Disp: , Rfl:  .  oxyCODONE-acetaminophen (PERCOCET) 10-325 MG tablet, Take 1 tablet by mouth every 8 (eight) hours as needed for pain., Disp: , Rfl:  .  RA SENNA PLUS 8.6-50 MG tablet, Take 1 tablet by mouth daily., Disp: , Rfl: 1 .  sertraline (ZOLOFT) 100 MG tablet, Take 1 tablet (100 mg total) by mouth daily., Disp: 90 tablet, Rfl: 3 .  tiotropium (SPIRIVA HANDIHALER) 18 MCG inhalation capsule, Place 1 capsule (18 mcg total) into inhaler and inhale daily., Disp: 90  capsule, Rfl: 0 .  Vitamin D, Cholecalciferol, 1000 units CAPS, Take by mouth daily., Disp: , Rfl:   Review of Systems  Constitutional: Negative.   Respiratory: Negative.   Cardiovascular: Negative.   Gastrointestinal: Positive for abdominal pain (right side from midline, along right chest under breast to back).  Musculoskeletal: Positive for back pain.  Skin: Negative for color change and rash.  Neurological: Negative for numbness.    Social History  Substance Use Topics  . Smoking status: Former Research scientist (life sciences)  . Smokeless tobacco: Never Used     Comment: quit 50 years ago  . Alcohol use No   Objective:   BP (!) 152/70 (BP Location: Left Arm, Patient Position: Sitting, Cuff Size: Normal)   Pulse 77   Temp 98.8 F (37.1 C) (Oral)   Resp 16   Wt 138 lb (62.6 kg)   BMI 20.38 kg/m    Physical Exam  Constitutional: She is oriented to person, place, and time. She appears well-developed and well-nourished. No distress.  Cardiovascular: Normal rate, regular rhythm and normal heart sounds.  Exam reveals no gallop and no friction rub.   No murmur heard. Pulmonary/Chest: Effort normal and breath sounds normal. No respiratory distress. She has no wheezes. She has no rales.  Abdominal: Soft. Normal appearance and bowel sounds are normal. She exhibits no distension and no mass. There is no hepatosplenomegaly. There is tenderness in the right upper quadrant and epigastric area. There is no rebound, no guarding and no CVA tenderness.    Neurological: She is alert and oriented to person, place, and time.  Skin: Skin is warm and dry. No rash noted. Rash is not papular and not vesicular. She is not diaphoretic.  Vitals reviewed.       Assessment & Plan:     1. Allodynia Noted in a dermatomal fashion. Highly suspicious for shingles but patient has not had a rash erupt yet. Patient has had zostavax many years back. She has not had shingrix vaccination yet. I will treat as below as if it were  shingles to see if pain improves. If no improvement will discontinue therapy. It is possible it may be a new symptom of her chronic back issues. She is also very kyphotic so there may be compression of the soft tissues causing the pain, however, I would suspect bilateral pain if this were the case. She is to call the office next week if no change in symptoms.   2. Herpes zoster without complication See above medical treatment plan. - valACYclovir (VALTREX) 1000 MG tablet; Take 1 tablet (1,000 mg total) by mouth 3 (three) times daily.  Dispense: 21 tablet; Refill: 0  3. Seborrheic keratosis There are a few seborrheic lesions noted at bra line and at her pant line that she reports have started to bother her in appearance. Triamcinolone cream given as below.  - triamcinolone cream (KENALOG) 0.1 %;  Apply 1 application topically 2 (two) times daily.  Dispense: 30 g; Refill: 0       Mar Daring, PA-C  [facility]

## 2016-12-22 NOTE — Telephone Encounter (Signed)
Pt was discharged from [facility] 12/21/16 for pain on right side under breast.  I have scheduled pt for a hospital follow for today/MW

## 2016-12-28 DIAGNOSIS — B028 Zoster with other complications: Secondary | ICD-10-CM | POA: Diagnosis not present

## 2016-12-28 DIAGNOSIS — K5909 Other constipation: Secondary | ICD-10-CM | POA: Diagnosis not present

## 2016-12-28 DIAGNOSIS — R1031 Right lower quadrant pain: Secondary | ICD-10-CM | POA: Diagnosis not present

## 2017-01-10 DIAGNOSIS — M5136 Other intervertebral disc degeneration, lumbar region: Secondary | ICD-10-CM | POA: Diagnosis not present

## 2017-01-10 DIAGNOSIS — M5431 Sciatica, right side: Secondary | ICD-10-CM | POA: Diagnosis not present

## 2017-01-10 DIAGNOSIS — M9903 Segmental and somatic dysfunction of lumbar region: Secondary | ICD-10-CM | POA: Diagnosis not present

## 2017-01-10 DIAGNOSIS — M9905 Segmental and somatic dysfunction of pelvic region: Secondary | ICD-10-CM | POA: Diagnosis not present

## 2017-01-24 DIAGNOSIS — M9903 Segmental and somatic dysfunction of lumbar region: Secondary | ICD-10-CM | POA: Diagnosis not present

## 2017-01-24 DIAGNOSIS — M5136 Other intervertebral disc degeneration, lumbar region: Secondary | ICD-10-CM | POA: Diagnosis not present

## 2017-01-24 DIAGNOSIS — M9905 Segmental and somatic dysfunction of pelvic region: Secondary | ICD-10-CM | POA: Diagnosis not present

## 2017-01-24 DIAGNOSIS — M5431 Sciatica, right side: Secondary | ICD-10-CM | POA: Diagnosis not present

## 2017-01-29 DIAGNOSIS — H353211 Exudative age-related macular degeneration, right eye, with active choroidal neovascularization: Secondary | ICD-10-CM | POA: Diagnosis not present

## 2017-01-29 DIAGNOSIS — H353122 Nonexudative age-related macular degeneration, left eye, intermediate dry stage: Secondary | ICD-10-CM | POA: Diagnosis not present

## 2017-02-07 DIAGNOSIS — M48062 Spinal stenosis, lumbar region with neurogenic claudication: Secondary | ICD-10-CM | POA: Diagnosis not present

## 2017-02-07 DIAGNOSIS — M5416 Radiculopathy, lumbar region: Secondary | ICD-10-CM | POA: Diagnosis not present

## 2017-02-07 DIAGNOSIS — M5136 Other intervertebral disc degeneration, lumbar region: Secondary | ICD-10-CM | POA: Diagnosis not present

## 2017-02-11 ENCOUNTER — Other Ambulatory Visit: Payer: Self-pay | Admitting: Physician Assistant

## 2017-02-21 DIAGNOSIS — M9903 Segmental and somatic dysfunction of lumbar region: Secondary | ICD-10-CM | POA: Diagnosis not present

## 2017-02-21 DIAGNOSIS — M9905 Segmental and somatic dysfunction of pelvic region: Secondary | ICD-10-CM | POA: Diagnosis not present

## 2017-02-21 DIAGNOSIS — M5136 Other intervertebral disc degeneration, lumbar region: Secondary | ICD-10-CM | POA: Diagnosis not present

## 2017-02-21 DIAGNOSIS — M5431 Sciatica, right side: Secondary | ICD-10-CM | POA: Diagnosis not present

## 2017-02-22 ENCOUNTER — Ambulatory Visit (INDEPENDENT_AMBULATORY_CARE_PROVIDER_SITE_OTHER): Payer: Medicare Other | Admitting: Physician Assistant

## 2017-02-22 ENCOUNTER — Encounter: Payer: Self-pay | Admitting: Physician Assistant

## 2017-02-22 VITALS — BP 138/60 | HR 58 | Temp 97.8°F | Resp 16 | Wt 138.0 lb

## 2017-02-22 DIAGNOSIS — K5901 Slow transit constipation: Secondary | ICD-10-CM | POA: Diagnosis not present

## 2017-02-22 DIAGNOSIS — I6523 Occlusion and stenosis of bilateral carotid arteries: Secondary | ICD-10-CM | POA: Diagnosis not present

## 2017-02-22 MED ORDER — LACTULOSE 10 GM/15ML PO SOLN: 10.0000 g | Freq: Two times a day (BID) | ORAL | 0 refills | Status: DC | PRN

## 2017-02-22 NOTE — Progress Notes (Signed)
Patient: Chelsea [name] Female    DOB: 1924/03/02   83 y.o.   MRN: [id number] Visit Date: 02/22/2017  Today's Provider: Mar Daring, PA-C   Chief Complaint  Patient presents with  . Constipation   Subjective:    HPI Patient is here today with c/o problems with bowel movements, she reports this is day 3 without a bowel movement and reports she is having muscle aches. She reports she went to see GI doctor in July 2018 for chronic constipation and was advised to take Laxativ, continue stool softener, push fluids and increase daily soluble fiber in diet.  This has been a long standing issue for her since she was a young girl. Over the last couple of years this as worsened due to decreased water intake, decreased mobility and having to use narcotic pain medications for her back. Of recent she has tried multiple OTC and Rx medications for constipation including: fiber supplement, Miralax, Senekot, stool softener OTC, bisacodyl, linzess 120mcg, amitiza 59mcg, and movantik 12.5 and 25mg . None of these medications have resulted in a normal stooling regimen. However, the patient does admit to not taking most of these regularly as they cause diarrhea. She stated today she will not take them on days where she has plans as she does not wish to have diarrhea at those times. It is possible some of these did not work just for the simple fact they were not taken daily long enough to get into a bowel routine. Since patient still does not wish to take on days when she may have plans, it will probably be best to use as needed laxatives and continue pushing hydration.     Allergies  Allergen Reactions  . Sulfa Antibiotics     Other reaction(s): Dizziness  . Sulfonamide Derivatives Other (See Comments)    Reaction:  Dizziness      Current Outpatient Prescriptions:  .  amLODipine (NORVASC) 10 MG tablet, take 1 tablet by mouth once daily, Disp: 90 tablet, Rfl: 3 .  aspirin EC 81 MG tablet, Take  81 mg by mouth at bedtime. 325 mg during the day for pain as needed, Disp: , Rfl:  .  atorvastatin (LIPITOR) 80 MG tablet, TAKE 1 TABLET DAILY, Disp: 90 tablet, Rfl: 3 .  fluticasone furoate-vilanterol (BREO ELLIPTA) 100-25 MCG/INH AEPB, Inhale 1 puff into the lungs daily., Disp: , Rfl:  .  ketoconazole (NIZORAL) 2 % shampoo, MASSAGE SHAMPOO ONTO SCALP AND LET SIT FOR 5 MINUTES THEN RINSE OUT as directed by prescriber, Disp: , Rfl: 0 .  levothyroxine (SYNTHROID, LEVOTHROID) 88 MCG tablet, take 1 tablet by mouth once daily, Disp: 90 tablet, Rfl: 3 .  loratadine (CLARITIN) 10 MG tablet, take 1 tablet by mouth once daily, Disp: 90 tablet, Rfl: 1 .  metaxalone (SKELAXIN) 800 MG tablet, Take 1 tablet (800 mg total) by mouth 3 (three) times daily as needed for muscle spasms., Disp: 15 tablet, Rfl: 0 .  Multiple Vitamins-Minerals (PRESERVISION AREDS PO), Take 1 capsule by mouth daily. , Disp: , Rfl:  .  RA SENNA PLUS 8.6-50 MG tablet, Take 1 tablet by mouth daily., Disp: , Rfl: 1 .  sertraline (ZOLOFT) 100 MG tablet, Take 1 tablet (100 mg total) by mouth daily., Disp: 90 tablet, Rfl: 3 .  tiotropium (SPIRIVA HANDIHALER) 18 MCG inhalation capsule, Place 1 capsule (18 mcg total) into inhaler and inhale daily., Disp: 90 capsule, Rfl: 0 .  Vitamin D, Cholecalciferol, 1000 units CAPS,  Take by mouth daily., Disp: , Rfl:  .  albuterol (PROVENTIL HFA;VENTOLIN HFA) 108 (90 Base) MCG/ACT inhaler, Inhale 2 puffs into the lungs every 6 (six) hours as needed for wheezing or shortness of breath., Disp: 1 Inhaler, Rfl: 2 .  ascorbic acid (VITAMIN C) 250 MG tablet, Take 1 tablet by mouth daily., Disp: , Rfl:  .  clobetasol (TEMOVATE) 0.05 % external solution, apply to affected area twice a day AVOID FACE, GROIN, UNDERARM, Disp: , Rfl: 0 .  denosumab (PROLIA) 60 MG/ML SOLN injection, Inject 60 mg into the skin every 6 (six) months. Administer in upper arm, thigh, or abdomen, Disp: , Rfl:  .  nitroGLYCERIN (NITROSTAT) 0.4 MG  SL tablet, Place 1 tablet (0.4 mg total) under the tongue every 5 (five) minutes as needed. (Patient not taking: Reported on 02/22/2017), Disp: 25 tablet, Rfl: 6 .  NON FORMULARY, Eye injection as directed Right eye., Disp: , Rfl:  .  NONFORMULARY OR COMPOUNDED ITEM, [facility]  Peripheral Neuropathy Cream- Bupivacaine 1%, Doxepin 3%, Gabapentin 6%, Pentoxifylline 3%, Topiramate 1% Apply 1-2 grams to affected area 3-4 times daily Qty. 120 gm 3 refills, Disp: , Rfl:  .  oxyCODONE-acetaminophen (PERCOCET) 10-325 MG tablet, Take 1 tablet by mouth every 8 (eight) hours as needed for pain., Disp: , Rfl:  .  triamcinolone cream (KENALOG) 0.1 %, Apply 1 application topically 2 (two) times daily. (Patient not taking: Reported on 02/22/2017), Disp: 30 g, Rfl: 0 .  valACYclovir (VALTREX) 1000 MG tablet, Take 1 tablet (1,000 mg total) by mouth 3 (three) times daily. (Patient not taking: Reported on 02/22/2017), Disp: 21 tablet, Rfl: 0  Review of Systems  Cardiovascular: Negative for chest pain, palpitations and leg swelling.  Gastrointestinal: Positive for abdominal pain and constipation. Negative for nausea and rectal pain.    Social History  Substance Use Topics  . Smoking status: Former Research scientist (life sciences)  . Smokeless tobacco: Never Used     Comment: quit 50 years ago  . Alcohol use No   Objective:   BP 138/60 (BP Location: Left Arm, Patient Position: Sitting, Cuff Size: Normal)   Pulse (!) 58   Temp 97.8 F (36.6 C) (Oral)   Resp 16   Wt 138 lb (62.6 kg)   BMI 20.38 kg/m    Physical Exam  Constitutional: She is oriented to person, place, and time. She appears well-developed and well-nourished. No distress.  Cardiovascular: Normal rate, regular rhythm and normal heart sounds.  Exam reveals no gallop and no friction rub.   No murmur heard. Pulmonary/Chest: Effort normal and breath sounds normal. No respiratory distress. She has no wheezes. She has no rales.  Abdominal: Soft. Normal appearance and  bowel sounds are normal. She exhibits no distension and no mass. There is no hepatosplenomegaly. There is generalized tenderness. There is no rebound, no guarding and no CVA tenderness.  Neurological: She is alert and oriented to person, place, and time.  Skin: Skin is warm and dry. She is not diaphoretic.        Assessment & Plan:     1. Slow transit constipation Since patient has tried multiple regimens without success I will send in lactulose as below. This is one laxative she has not yet tried. I advised her to stop taking Bisacodyl at this time and use lactulose instead. Also discussed her continuing to push fluids and increase soluble fiber in diet. She reports cutting back her oral water intake in the evenings to prevent having incontinent nocturia. Discussed  continuing hydration and consider wearing an adult brief to bed. She agrees trying. She is to call the office on Monday and update [place] on how she tolerates the medication and if she has a BM.  - lactulose (CHRONULAC) 10 GM/15ML solution; Take 15 mLs (10 g total) by mouth 2 (two) times daily as needed for moderate constipation.  Dispense: 240 mL; Refill: 0       Mar Daring, PA-C  [facility]

## 2017-02-22 NOTE — Patient Instructions (Addendum)
*Start Lactulose instead of Bisacodyl*  Lactulose oral solution What is this medicine? LACTULOSE (LAK tyoo lose) is a laxative derived from lactose. It helps to treat chronic constipation and to treat or prevent hepatic encephalopathy or coma. These are brain disorders that result from liver disease. This medicine may be used for other purposes; ask your health care provider or pharmacist if you have questions. COMMON BRAND NAME(S): Acilac, Cephulac, Cholac, Chronulac, Constilac, Constulose, Enulose, Generlac What should I tell my health care provider before I take this medicine? They need to know if you have any of these conditions: -need a galactose-free diet -scheduled for surgery -an unusual or allergic reaction to lactulose, other sugars, medicines, foods, dyes or preservatives -pregnant or trying to get pregnant -breast-feeding How should I use this medicine? Take this medicine mouth. Follow the directions on the prescription label. Shake well before using. Use a specially marked spoon or container to measure your medicine. Ask your pharmacist if you do not have one. Household spoons are not accurate. Take your doses at regular intervals. Do not take your medicine more often than directed. You may be directed to take this medicine rectally. If so, you must follow specific directions from your doctor or healthcare professional. Please contact him or her. Talk to your pediatrician regarding the use of this medicine in children. Special care may be needed. Overdosage: If you think you have taken too much of this medicine contact a poison control center or emergency room at once. NOTE: This medicine is only for you. Do not share this medicine with others. What if I miss a dose? If you miss a dose, take it as soon as you can. If it is almost time for your next dose, take only that dose. Do not take double or extra doses. What may interact with this medicine? -antacids -neomycin -other  laxatives This list may not describe all possible interactions. Give your health care provider a list of all the medicines, herbs, non-prescription drugs, or dietary supplements you use. Also tell them if you smoke, drink alcohol, or use illegal drugs. Some items may interact with your medicine. What should I watch for while using this medicine? This medicine may not produce any result for 24 to 48 hours. Do not take this medicine for longer than directed by your doctor or health care professional. Drink plenty of water with each dose of this medicine. What side effects may I notice from receiving this medicine? Side effects that you should report to your doctor or health care professional as soon as possible: -diarrhea Side effects that usually do not require medical attention (report to your doctor or health care professional if they continue or are bothersome): -belching, flatulence -nausea or vomiting -stomach pain or discomfort This list may not describe all possible side effects. Call your doctor for medical advice about side effects. You may report side effects to FDA at 1-800-FDA-1088. Where should I keep my medicine? Keep out of the reach of children. This medicine may darken in color under normal storage conditions. This is because of the sugar solution and does not affect the way the medicine works. If the solution becomes extremely dark in color, contact your health care professional before use. Store at room temperature between 15 and 30 degrees C (59 and 86 degrees F). Do not freeze. Keep container tightly closed. Throw away any unused medicine after the expiration date. NOTE: This sheet is a summary. It may not cover all possible information. If you have  questions about this medicine, talk to your doctor, pharmacist, or health care provider.  2018 Elsevier/Gold Standard (2007-12-10 16:04:57)

## 2017-02-26 ENCOUNTER — Telehealth: Payer: Self-pay | Admitting: Physician Assistant

## 2017-02-26 NOTE — Telephone Encounter (Signed)
Pt is now having some bowel movements. She said the mediation is working   What does she need to do now?  Pt's call back is [phone]  Thanks Con Memos

## 2017-02-26 NOTE — Telephone Encounter (Signed)
Please Review.  Thanks,  -Joseline 

## 2017-02-26 NOTE — Telephone Encounter (Signed)
Patient advised as directed below.  Thanks,  -Alany Borman 

## 2017-02-26 NOTE — Telephone Encounter (Signed)
If she can tolerate this is a medication she can take daily. Continue to push fluids.

## 2017-03-01 ENCOUNTER — Telehealth: Payer: Self-pay | Admitting: *Deleted

## 2017-03-01 NOTE — Telephone Encounter (Signed)
This is ok. She can still take the spiriva dose as normal to get the effect in her lungs we desire. Swallowing will not cause any issue she just does not get the intended effect of the medication. This is an issue that used to happen more frequently when spiriva initially came out. Essentially all it will cause is just she will be short a pill now from having to use 2 this morning.

## 2017-03-01 NOTE — Telephone Encounter (Signed)
Patient called office concerning her spriva inhaler. Patient stated that she accidentally took the spriva pill instead of inhaling it. Patient wanted to check with Patrici Ranks to make sure this was ok. Also if it's ok to go ahead and take another dose the correct way. Please advise?

## 2017-03-01 NOTE — Telephone Encounter (Signed)
Patient was notified.

## 2017-03-08 DIAGNOSIS — E559 Vitamin D deficiency, unspecified: Secondary | ICD-10-CM | POA: Diagnosis not present

## 2017-03-08 DIAGNOSIS — E039 Hypothyroidism, unspecified: Secondary | ICD-10-CM | POA: Diagnosis not present

## 2017-03-08 DIAGNOSIS — M810 Age-related osteoporosis without current pathological fracture: Secondary | ICD-10-CM | POA: Diagnosis not present

## 2017-03-13 ENCOUNTER — Other Ambulatory Visit: Payer: Self-pay | Admitting: Specialist

## 2017-03-13 ENCOUNTER — Ambulatory Visit
Admission: RE | Admit: 2017-03-13 | Discharge: 2017-03-13 | Disposition: A | Discharge status: Home / Self Care | Payer: Medicare Other | Source: Ambulatory Visit | Attending: Specialist | Admitting: Specialist

## 2017-03-13 DIAGNOSIS — M79605 Pain in left leg: Secondary | ICD-10-CM

## 2017-03-13 DIAGNOSIS — R0602 Shortness of breath: Secondary | ICD-10-CM | POA: Insufficient documentation

## 2017-03-13 DIAGNOSIS — J310 Chronic rhinitis: Secondary | ICD-10-CM | POA: Diagnosis not present

## 2017-03-13 DIAGNOSIS — J8410 Pulmonary fibrosis, unspecified: Secondary | ICD-10-CM | POA: Diagnosis not present

## 2017-03-13 DIAGNOSIS — M79662 Pain in left lower leg: Secondary | ICD-10-CM | POA: Insufficient documentation

## 2017-03-13 DIAGNOSIS — J449 Chronic obstructive pulmonary disease, unspecified: Secondary | ICD-10-CM | POA: Diagnosis not present

## 2017-03-13 DIAGNOSIS — R600 Localized edema: Secondary | ICD-10-CM

## 2017-03-13 DIAGNOSIS — R05 Cough: Secondary | ICD-10-CM | POA: Diagnosis not present

## 2017-03-13 DIAGNOSIS — M7989 Other specified soft tissue disorders: Secondary | ICD-10-CM | POA: Diagnosis not present

## 2017-03-15 ENCOUNTER — Other Ambulatory Visit: Payer: Self-pay

## 2017-03-15 ENCOUNTER — Telehealth: Payer: Self-pay | Admitting: Cardiovascular Disease

## 2017-03-15 MED ORDER — ATORVASTATIN CALCIUM 80 MG PO TABS: 80.0000 mg | ORAL_TABLET | Freq: Every day | ORAL | 3 refills | Status: AC

## 2017-03-15 NOTE — Telephone Encounter (Signed)
Requested Prescriptions   Signed Prescriptions Disp Refills  . atorvastatin (LIPITOR) 80 MG tablet 90 tablet 3    Sig: Take 1 tablet (80 mg total) by mouth daily.    Authorizing Provider: Minna Merritts    Ordering User: Janan Ridge

## 2017-03-15 NOTE — Telephone Encounter (Signed)
Patient says [facility] called and is changing the rx for her cholesterol medication   Please call to confirm with patient

## 2017-03-16 NOTE — Telephone Encounter (Signed)
Spoke with patient and she states that pharmacy called her about prescription change on her lipitor. Reviewed chart and it appears that we just sent in refills but there was no change in dosage or frequency. Instructed her to please call back if she should have any further questions or concerns. She was very appreciative for the call and had no further questions at this time.

## 2017-03-22 ENCOUNTER — Encounter: Payer: Self-pay | Admitting: Physician Assistant

## 2017-03-22 ENCOUNTER — Ambulatory Visit (INDEPENDENT_AMBULATORY_CARE_PROVIDER_SITE_OTHER): Payer: Medicare Other | Admitting: Physician Assistant

## 2017-03-22 VITALS — BP 112/60 | HR 86 | Temp 97.7°F | Resp 16 | Wt 135.0 lb

## 2017-03-22 DIAGNOSIS — E875 Hyperkalemia: Secondary | ICD-10-CM

## 2017-03-22 DIAGNOSIS — J0140 Acute pansinusitis, unspecified: Secondary | ICD-10-CM

## 2017-03-22 DIAGNOSIS — I6523 Occlusion and stenosis of bilateral carotid arteries: Secondary | ICD-10-CM | POA: Diagnosis not present

## 2017-03-22 LAB — BASIC METABOLIC PANEL WITH GFR
BUN / CREAT RATIO: 22 (calc) (ref 6–22)
BUN: 27 mg/dL — ABNORMAL HIGH (ref 7–25)
CALCIUM: 9.5 mg/dL (ref 8.6–10.4)
CHLORIDE: 105 mmol/L (ref 98–110)
CO2: 25 mmol/L (ref 20–32)
Creat: 1.22 mg/dL — ABNORMAL HIGH (ref 0.60–0.88)
GFR, EST AFRICAN AMERICAN: 45 mL/min/{1.73_m2} — AB (ref >=60)
GFR, Est Non African American: 39 mL/min/{1.73_m2} — ABNORMAL LOW (ref >=60)
Glucose, Bld: 95 mg/dL (ref 65–99)
POTASSIUM: 4.8 mmol/L (ref 3.5–5.3)
Sodium: 140 mmol/L (ref 135–146)

## 2017-03-22 MED ORDER — AMOXICILLIN-POT CLAVULANATE 875-125 MG PO TABS: 1.0000 | ORAL_TABLET | Freq: Two times a day (BID) | ORAL | 0 refills | Status: DC

## 2017-03-22 NOTE — Progress Notes (Signed)
Patient: Chelsea [name] Female    DOB: 09-May-1924   83 y.o.   MRN: [id number] Visit Date: 03/22/2017  Today's Provider: Mar Daring, PA-C   Chief Complaint  Patient presents with  . URI   Subjective:    URI   This is a new problem. The current episode started in the past 7 days (Monday night). The problem has been gradually worsening. There has been no fever. Associated symptoms include congestion, coughing, ear pain, headaches, neck pain, a plugged ear sensation and a sore throat. Pertinent negatives include no abdominal pain, chest pain, rhinorrhea, sneezing or wheezing. Associated symptoms comments: Body aches. She has tried antihistamine for the symptoms. The treatment provided no relief.       Allergies  Allergen Reactions  . Sulfa Antibiotics     Other reaction(s): Dizziness  . Sulfonamide Derivatives Other (See Comments)    Reaction:  Dizziness      Current Outpatient Prescriptions:  .  albuterol (PROVENTIL HFA;VENTOLIN HFA) 108 (90 Base) MCG/ACT inhaler, Inhale 2 puffs into the lungs every 6 (six) hours as needed for wheezing or shortness of breath., Disp: 1 Inhaler, Rfl: 2 .  amLODipine (NORVASC) 10 MG tablet, take 1 tablet by mouth once daily, Disp: 90 tablet, Rfl: 3 .  ascorbic acid (VITAMIN C) 250 MG tablet, Take 1 tablet by mouth daily., Disp: , Rfl:  .  aspirin EC 81 MG tablet, Take 81 mg by mouth at bedtime. 325 mg during the day for pain as needed, Disp: , Rfl:  .  atorvastatin (LIPITOR) 80 MG tablet, Take 1 tablet (80 mg total) by mouth daily., Disp: 90 tablet, Rfl: 3 .  clobetasol (TEMOVATE) 0.05 % external solution, apply to affected area twice a day AVOID FACE, GROIN, UNDERARM, Disp: , Rfl: 0 .  denosumab (PROLIA) 60 MG/ML SOLN injection, Inject 60 mg into the skin every 6 (six) months. Administer in upper arm, thigh, or abdomen, Disp: , Rfl:  .  fluticasone furoate-vilanterol (BREO ELLIPTA) 100-25 MCG/INH AEPB, Inhale 1 puff into the lungs  daily., Disp: , Rfl:  .  ketoconazole (NIZORAL) 2 % shampoo, MASSAGE SHAMPOO ONTO SCALP AND LET SIT FOR 5 MINUTES THEN RINSE OUT as directed by prescriber, Disp: , Rfl: 0 .  lactulose (CHRONULAC) 10 GM/15ML solution, Take 15 mLs (10 g total) by mouth 2 (two) times daily as needed for moderate constipation., Disp: 240 mL, Rfl: 0 .  levothyroxine (SYNTHROID, LEVOTHROID) 88 MCG tablet, take 1 tablet by mouth once daily, Disp: 90 tablet, Rfl: 3 .  loratadine (CLARITIN) 10 MG tablet, take 1 tablet by mouth once daily, Disp: 90 tablet, Rfl: 1 .  metaxalone (SKELAXIN) 800 MG tablet, Take 1 tablet (800 mg total) by mouth 3 (three) times daily as needed for muscle spasms., Disp: 15 tablet, Rfl: 0 .  Multiple Vitamins-Minerals (PRESERVISION AREDS PO), Take 1 capsule by mouth daily. , Disp: , Rfl:  .  NON FORMULARY, Eye injection as directed Right eye., Disp: , Rfl:  .  NONFORMULARY OR COMPOUNDED ITEM, [facility]  Peripheral Neuropathy Cream- Bupivacaine 1%, Doxepin 3%, Gabapentin 6%, Pentoxifylline 3%, Topiramate 1% Apply 1-2 grams to affected area 3-4 times daily Qty. 120 gm 3 refills, Disp: , Rfl:  .  oxyCODONE-acetaminophen (PERCOCET) 10-325 MG tablet, Take 1 tablet by mouth every 8 (eight) hours as needed for pain., Disp: , Rfl:  .  RA SENNA PLUS 8.6-50 MG tablet, Take 1 tablet by mouth daily., Disp: ,  Rfl: 1 .  sertraline (ZOLOFT) 100 MG tablet, Take 1 tablet (100 mg total) by mouth daily., Disp: 90 tablet, Rfl: 3 .  tiotropium (SPIRIVA HANDIHALER) 18 MCG inhalation capsule, Place 1 capsule (18 mcg total) into inhaler and inhale daily., Disp: 90 capsule, Rfl: 0 .  Vitamin D, Cholecalciferol, 1000 units CAPS, Take by mouth daily., Disp: , Rfl:  .  nitroGLYCERIN (NITROSTAT) 0.4 MG SL tablet, Place 1 tablet (0.4 mg total) under the tongue every 5 (five) minutes as needed. (Patient not taking: Reported on 02/22/2017), Disp: 25 tablet, Rfl: 6 .  triamcinolone cream (KENALOG) 0.1 %, Apply 1 application  topically 2 (two) times daily. (Patient not taking: Reported on 02/22/2017), Disp: 30 g, Rfl: 0 .  valACYclovir (VALTREX) 1000 MG tablet, Take 1 tablet (1,000 mg total) by mouth 3 (three) times daily. (Patient not taking: Reported on 02/22/2017), Disp: 21 tablet, Rfl: 0  Review of Systems  Constitutional: Negative for chills and fever.  HENT: Positive for congestion, ear pain, postnasal drip, sinus pressure, sore throat and trouble swallowing. Negative for rhinorrhea and sneezing.   Respiratory: Positive for cough. Negative for chest tightness, shortness of breath and wheezing.   Cardiovascular: Negative for chest pain.  Gastrointestinal: Negative for abdominal pain.  Musculoskeletal: Positive for neck pain.  Neurological: Positive for headaches. Negative for dizziness and light-headedness.    Social History  Substance Use Topics  . Smoking status: Former Research scientist (life sciences)  . Smokeless tobacco: Never Used     Comment: quit 50 years ago  . Alcohol use No   Objective:   BP 112/60 (BP Location: Left Arm, Patient Position: Sitting, Cuff Size: Normal)   Pulse 86   Temp 97.7 F (36.5 C) (Oral)   Resp 16   Wt 135 lb (61.2 kg)   SpO2 98%   BMI 19.94 kg/m     Physical Exam  Constitutional: She appears well-developed and well-nourished. No distress.  HENT:  Head: Normocephalic and atraumatic.  Right Ear: Hearing, external ear and ear canal normal. Tympanic membrane is not perforated, not erythematous and not bulging. A middle ear effusion is present.  Left Ear: Hearing, external ear and ear canal normal. Tympanic membrane is not perforated, not erythematous and not bulging. A middle ear effusion is present.  Nose: Mucosal edema present. Right sinus exhibits maxillary sinus tenderness and frontal sinus tenderness. Left sinus exhibits maxillary sinus tenderness and frontal sinus tenderness.  Mouth/Throat: Uvula is midline, oropharynx is clear and moist and mucous membranes are normal. No oropharyngeal  exudate, posterior oropharyngeal edema or posterior oropharyngeal erythema.  Neck: Normal range of motion. Neck supple. No tracheal deviation present. No thyromegaly present.  Cardiovascular: Normal rate, regular rhythm and normal heart sounds.  Exam reveals no gallop and no friction rub.   No murmur heard. Pulmonary/Chest: Effort normal and breath sounds normal. No stridor. No respiratory distress. She has no wheezes. She has no rales.  Lymphadenopathy:    She has no cervical adenopathy.  Skin: She is not diaphoretic.  Vitals reviewed.  Depression screen PHQ 2/9 03/22/2017  Decreased Interest 1  Down, Depressed, Hopeless 1  PHQ - 2 Score 2  Altered sleeping 0  Tired, decreased energy 0  Change in appetite 0  Feeling bad or failure about yourself  0  Trouble concentrating 0  Moving slowly or fidgety/restless 0  Suicidal thoughts 0  PHQ-9 Score 2  Difficult doing work/chores Not difficult at all       Assessment & Plan:  1. Acute pansinusitis, recurrence not specified Worsening symptoms that have not responded to OTC medications. Will give augmentin as below. Continue allergy medications. Stay well hydrated and get plenty of rest. Call if no symptom improvement or if symptoms worsen. - amoxicillin-clavulanate (AUGMENTIN) 875-125 MG tablet; Take 1 tablet by mouth 2 (two) times daily.  Dispense: 20 tablet; Refill: 0  2. Hyperkalemia Was elevated on 03/08/17. Will check labs as below and f/u pending results. - BASIC METABOLIC PANEL WITH GFR       Mar Daring, PA-C  [facility]

## 2017-03-22 NOTE — Patient Instructions (Signed)
Sinusitis, Adult Sinusitis is soreness and inflammation of your sinuses. Sinuses are hollow spaces in the bones around your face. Your sinuses are located:  Around your eyes.  In the middle of your forehead.  Behind your nose.  In your cheekbones.  Your sinuses and nasal passages are lined with a stringy fluid (mucus). Mucus normally drains out of your sinuses. When your nasal tissues become inflamed or swollen, the mucus can become trapped or blocked so air cannot flow through your sinuses. This allows bacteria, viruses, and funguses to grow, which leads to infection. Sinusitis can develop quickly and last for 7?10 days (acute) or for more than 12 weeks (chronic). Sinusitis often develops after a cold. What are the causes? This condition is caused by anything that creates swelling in the sinuses or stops mucus from draining, including:  Allergies.  Asthma.  Bacterial or viral infection.  Abnormally shaped bones between the nasal passages.  Nasal growths that contain mucus (nasal polyps).  Narrow sinus openings.  Pollutants, such as chemicals or irritants in the air.  A foreign object stuck in the nose.  A fungal infection. This is rare.  What increases the risk? The following factors may make you more likely to develop this condition:  Having allergies or asthma.  Having had a recent cold or respiratory tract infection.  Having structural deformities or blockages in your nose or sinuses.  Having a weak immune system.  Doing a lot of swimming or diving.  Overusing nasal sprays.  Smoking.  What are the signs or symptoms? The main symptoms of this condition are pain and a feeling of pressure around the affected sinuses. Other symptoms include:  Upper toothache.  Earache.  Headache.  Bad breath.  Decreased sense of smell and taste.  A cough that may get worse at night.  Fatigue.  Fever.  Thick drainage from your nose. The drainage is often green and  it may contain pus (purulent).  Stuffy nose or congestion.  Postnasal drip. This is when extra mucus collects in the throat or back of the nose.  Swelling and warmth over the affected sinuses.  Sore throat.  Sensitivity to light.  How is this diagnosed? This condition is diagnosed based on symptoms, a medical history, and a physical exam. To find out if your condition is acute or chronic, your health care provider may:  Look in your nose for signs of nasal polyps.  Tap over the affected sinus to check for signs of infection.  View the inside of your sinuses using an imaging device that has a light attached (endoscope).  If your health care provider suspects that you have chronic sinusitis, you may also:  Be tested for allergies.  Have a sample of mucus taken from your nose (nasal culture) and checked for bacteria.  Have a mucus sample examined to see if your sinusitis is related to an allergy.  If your sinusitis does not respond to treatment and it lasts longer than 8 weeks, you may have an MRI or CT scan to check your sinuses. These scans also help to determine how severe your infection is. In rare cases, a bone biopsy may be done to rule out more serious types of fungal sinus disease. How is this treated? Treatment for sinusitis depends on the cause and whether your condition is chronic or acute. If a virus is causing your sinusitis, your symptoms will go away on their own within 10 days. You may be given medicines to relieve your symptoms,   including:  Topical nasal decongestants. They shrink swollen nasal passages and let mucus drain from your sinuses.  Antihistamines. These drugs block inflammation that is triggered by allergies. This can help to ease swelling in your nose and sinuses.  Topical nasal corticosteroids. These are nasal sprays that ease inflammation and swelling in your nose and sinuses.  Nasal saline washes. These rinses can help to get rid of thick mucus in  your nose.  If your condition is caused by bacteria, you will be given an antibiotic medicine. If your condition is caused by a fungus, you will be given an antifungal medicine. Surgery may be needed to correct underlying conditions, such as narrow nasal passages. Surgery may also be needed to remove polyps. Follow these instructions at home: Medicines  Take, use, or apply over-the-counter and prescription medicines only as told by your health care provider. These may include nasal sprays.  If you were prescribed an antibiotic medicine, take it as told by your health care provider. Do not stop taking the antibiotic even if you start to feel better. Hydrate and Humidify  Drink enough water to keep your urine clear or pale yellow. Staying hydrated will help to thin your mucus.  Use a cool mist humidifier to keep the humidity level in your home above 50%.  Inhale steam for 10-15 minutes, 3-4 times a day or as told by your health care provider. You can do this in the bathroom while a hot shower is running.  Limit your exposure to cool or dry air. Rest  Rest as much as possible.  Sleep with your head raised (elevated).  Make sure to get enough sleep each night. General instructions  Apply a warm, moist washcloth to your face 3-4 times a day or as told by your health care provider. This will help with discomfort.  Wash your hands often with soap and water to reduce your exposure to viruses and other germs. If soap and water are not available, use hand sanitizer.  Do not smoke. Avoid being around people who are smoking (secondhand smoke).  Keep all follow-up visits as told by your health care provider. This is important. Contact a health care provider if:  You have a fever.  Your symptoms get worse.  Your symptoms do not improve within 10 days. Get help right away if:  You have a severe headache.  You have persistent vomiting.  You have pain or swelling around your face or  eyes.  You have vision problems.  You develop confusion.  Your neck is stiff.  You have trouble breathing. This information is not intended to replace advice given to you by your health care provider. Make sure you discuss any questions you have with your health care provider. Document Released: 06/05/2005 Document Revised: 01/30/2016 Document Reviewed: 03/31/2015 Elsevier Interactive Patient Education  2017 Elsevier [employer]. Amoxicillin; Clavulanic Acid tablets What is this medicine? AMOXICILLIN; CLAVULANIC ACID (a mox i SIL in; KLAV yoo lan ic AS id) is a penicillin antibiotic. It is used to treat certain kinds of bacterial infections. It will not work for colds, flu, or other viral infections. This medicine may be used for other purposes; ask your health care provider or pharmacist if you have questions. COMMON BRAND NAME(S): Augmentin What should I tell my health care provider before I take this medicine? They need to know if you have any of these conditions: -bowel disease, like colitis -kidney disease -liver disease -mononucleosis -an unusual or allergic reaction to amoxicillin, penicillin, cephalosporin,  other antibiotics, clavulanic acid, other medicines, foods, dyes, or preservatives -pregnant or trying to get pregnant -breast-feeding How should I use this medicine? Take this medicine by mouth with a full glass of water. Follow the directions on the prescription label. Take at the start of a meal. Do not crush or chew. If the tablet has a score line, you may cut it in half at the score line for easier swallowing. Take your medicine at regular intervals. Do not take your medicine more often than directed. Take all of your medicine as directed even if you think you are better. Do not skip doses or stop your medicine early. Talk to your pediatrician regarding the use of this medicine in children. Special care may be needed. Overdosage: If you think you have taken too much of this  medicine contact a poison control center or emergency room at once. NOTE: This medicine is only for you. Do not share this medicine with others. What if I miss a dose? If you miss a dose, take it as soon as you can. If it is almost time for your next dose, take only that dose. Do not take double or extra doses. What may interact with this medicine? -allopurinol -anticoagulants -birth control pills -methotrexate -probenecid This list may not describe all possible interactions. Give your health care provider a list of all the medicines, herbs, non-prescription drugs, or dietary supplements you use. Also tell them if you smoke, drink alcohol, or use illegal drugs. Some items may interact with your medicine. What should I watch for while using this medicine? Tell your doctor or health care professional if your symptoms do not improve. Do not treat diarrhea with over the counter products. Contact your doctor if you have diarrhea that lasts more than 2 days or if it is severe and watery. If you have diabetes, you may get a false-positive result for sugar in your urine. Check with your doctor or health care professional. Birth control pills may not work properly while you are taking this medicine. Talk to your doctor about using an extra method of birth control. What side effects may I notice from receiving this medicine? Side effects that you should report to your doctor or health care professional as soon as possible: -allergic reactions like skin rash, itching or hives, swelling of the face, lips, or tongue -breathing problems -dark urine -fever or chills, sore throat -redness, blistering, peeling or loosening of the skin, including inside the mouth -seizures -trouble passing urine or change in the amount of urine -unusual bleeding, bruising -unusually weak or tired -white patches or sores in the mouth or throat Side effects that usually do not require medical attention (report to your doctor  or health care professional if they continue or are bothersome): -diarrhea -dizziness -headache -nausea, vomiting -stomach upset -vaginal or anal irritation This list may not describe all possible side effects. Call your doctor for medical advice about side effects. You may report side effects to FDA at 1-800-FDA-1088. Where should I keep my medicine? Keep out of the reach of children. Store at room temperature below 25 degrees C (77 degrees F). Keep container tightly closed. Throw away any unused medicine after the expiration date. NOTE: This sheet is a summary. It may not cover all possible information. If you have questions about this medicine, talk to your doctor, pharmacist, or health care provider.  2018 Elsevier/Gold Standard (2007-08-29 12:04:30)

## 2017-03-23 ENCOUNTER — Telehealth: Payer: Self-pay

## 2017-03-23 NOTE — Telephone Encounter (Signed)
-----   Message from Mar Daring, [place] sent at 03/23/2017 11:44 AM EDT ----- I would prefer she try tylenol due to kidney fucntion, but can alternate tylenol and ibuprofen every 3 hours as needed for fevers and body aches

## 2017-03-23 NOTE — Telephone Encounter (Signed)
Patient advised as directed below.  Thanks,  -Forrest Jaroszewski 

## 2017-03-26 ENCOUNTER — Ambulatory Visit: Payer: Medicare Other | Admitting: Podiatry

## 2017-03-27 ENCOUNTER — Other Ambulatory Visit: Payer: Self-pay | Admitting: Physician Assistant

## 2017-03-27 DIAGNOSIS — E039 Hypothyroidism, unspecified: Secondary | ICD-10-CM

## 2017-04-02 ENCOUNTER — Ambulatory Visit: Payer: Medicare Other | Admitting: Podiatry

## 2017-04-02 DIAGNOSIS — J309 Allergic rhinitis, unspecified: Secondary | ICD-10-CM | POA: Diagnosis not present

## 2017-04-02 DIAGNOSIS — J300 Vasomotor rhinitis: Secondary | ICD-10-CM | POA: Diagnosis not present

## 2017-04-02 DIAGNOSIS — R51 Headache: Secondary | ICD-10-CM | POA: Diagnosis not present

## 2017-04-06 ENCOUNTER — Ambulatory Visit (INDEPENDENT_AMBULATORY_CARE_PROVIDER_SITE_OTHER): Payer: Medicare Other | Admitting: Physician Assistant

## 2017-04-06 VITALS — BP 148/62 | HR 78 | Temp 97.6°F | Resp 16 | Wt 136.2 lb

## 2017-04-06 DIAGNOSIS — R350 Frequency of micturition: Secondary | ICD-10-CM

## 2017-04-06 LAB — POCT URINALYSIS DIPSTICK
Bilirubin, UA: NEGATIVE
Blood, UA: NEGATIVE
Glucose, UA: NEGATIVE
Ketones, UA: NEGATIVE
Leukocytes, UA: NEGATIVE
Nitrite, UA: NEGATIVE
Protein, UA: NEGATIVE
Spec Grav, UA: 1.01 (ref 1.010–1.025)
Urobilinogen, UA: 0.2 E.U./dL
pH, UA: 6 (ref 5.0–8.0)

## 2017-04-06 NOTE — Patient Instructions (Signed)

## 2017-04-06 NOTE — Progress Notes (Signed)
Patient: Chelsea [name] Female    DOB: 1924-05-07   83 y.o.   MRN: [id number] Visit Date: 04/06/2017  Today's Provider: Trinna Post, PA-C   Chief Complaint  Patient presents with  . Urinary Frequency   Subjective:    HPI  Chelsea [name] is a 83 y.o woman with history of HTN, L4-L5 spinal stenosis, and COPD presenting today to discuss urinary urgency and frequency that started last night. She had to get up every 2 hours to use the bathroom which is unusual for her. She did have some burning with urination a week ago which has subsided now. She recently finished a course of abx, but denies vaginal discharge. Back pain present but states this is chronic for her. No nausea, no chills or fever.     Allergies  Allergen Reactions  . Sulfa Antibiotics     Other reaction(s): Dizziness  . Sulfonamide Derivatives Other (See Comments)    Reaction:  Dizziness      Current Outpatient Prescriptions:  .  albuterol (PROVENTIL HFA;VENTOLIN HFA) 108 (90 Base) MCG/ACT inhaler, Inhale 2 puffs into the lungs every 6 (six) hours as needed for wheezing or shortness of breath., Disp: 1 Inhaler, Rfl: 2 .  amLODipine (NORVASC) 10 MG tablet, take 1 tablet by mouth once daily, Disp: 90 tablet, Rfl: 3 .  ascorbic acid (VITAMIN C) 250 MG tablet, Take 1 tablet by mouth daily., Disp: , Rfl:  .  aspirin EC 81 MG tablet, Take 81 mg by mouth at bedtime. 325 mg during the day for pain as needed, Disp: , Rfl:  .  atorvastatin (LIPITOR) 80 MG tablet, Take 1 tablet (80 mg total) by mouth daily., Disp: 90 tablet, Rfl: 3 .  Azelastine HCl 0.15 % SOLN, instill 2 puffs by mouth into each nostril twice a day, Disp: , Rfl: 1 .  clobetasol (TEMOVATE) 0.05 % external solution, apply to affected area twice a day AVOID FACE, GROIN, UNDERARM, Disp: , Rfl: 0 .  denosumab (PROLIA) 60 MG/ML SOLN injection, Inject 60 mg into the skin every 6 (six) months. Administer in upper arm, thigh, or abdomen, Disp: , Rfl:  .   fluticasone furoate-vilanterol (BREO ELLIPTA) 100-25 MCG/INH AEPB, Inhale 1 puff into the lungs daily., Disp: , Rfl:  .  ipratropium (ATROVENT) 0.06 % nasal spray, instill 2 puffs into each nostril every 8 hours if needed FOR RUNNY NOSE, Disp: , Rfl: 1 .  ketoconazole (NIZORAL) 2 % shampoo, MASSAGE SHAMPOO ONTO SCALP AND LET SIT FOR 5 MINUTES THEN RINSE OUT as directed by prescriber, Disp: , Rfl: 0 .  lactulose (CHRONULAC) 10 GM/15ML solution, Take 15 mLs (10 g total) by mouth 2 (two) times daily as needed for moderate constipation., Disp: 240 mL, Rfl: 0 .  levothyroxine (SYNTHROID, LEVOTHROID) 88 MCG tablet, take 1 tablet by mouth once daily, Disp: 90 tablet, Rfl: 3 .  loratadine (CLARITIN) 10 MG tablet, take 1 tablet by mouth once daily, Disp: 90 tablet, Rfl: 1 .  metaxalone (SKELAXIN) 800 MG tablet, Take 1 tablet (800 mg total) by mouth 3 (three) times daily as needed for muscle spasms., Disp: 15 tablet, Rfl: 0 .  Multiple Vitamins-Minerals (PRESERVISION AREDS PO), Take 1 capsule by mouth daily. , Disp: , Rfl:  .  nitroGLYCERIN (NITROSTAT) 0.4 MG SL tablet, Place 1 tablet (0.4 mg total) under the tongue every 5 (five) minutes as needed., Disp: 25 tablet, Rfl: 6 .  NON FORMULARY, Eye injection as  directed Right eye., Disp: , Rfl:  .  NONFORMULARY OR COMPOUNDED ITEM, [facility]  Peripheral Neuropathy Cream- Bupivacaine 1%, Doxepin 3%, Gabapentin 6%, Pentoxifylline 3%, Topiramate 1% Apply 1-2 grams to affected area 3-4 times daily Qty. 120 gm 3 refills, Disp: , Rfl:  .  oxyCODONE-acetaminophen (PERCOCET) 10-325 MG tablet, Take 1 tablet by mouth every 8 (eight) hours as needed for pain., Disp: , Rfl:  .  RA SENNA PLUS 8.6-50 MG tablet, Take 1 tablet by mouth daily., Disp: , Rfl: 1 .  sertraline (ZOLOFT) 100 MG tablet, Take 1 tablet (100 mg total) by mouth daily., Disp: 90 tablet, Rfl: 3 .  tiotropium (SPIRIVA HANDIHALER) 18 MCG inhalation capsule, Place 1 capsule (18 mcg total) into inhaler and  inhale daily., Disp: 90 capsule, Rfl: 0 .  triamcinolone cream (KENALOG) 0.1 %, Apply 1 application topically 2 (two) times daily., Disp: 30 g, Rfl: 0 .  valACYclovir (VALTREX) 1000 MG tablet, Take 1 tablet (1,000 mg total) by mouth 3 (three) times daily., Disp: 21 tablet, Rfl: 0 .  Vitamin D, Cholecalciferol, 1000 units CAPS, Take by mouth daily., Disp: , Rfl:   Review of Systems  Constitutional: Negative.   Respiratory: Negative.   Cardiovascular: Negative.   Gastrointestinal: Negative.   Genitourinary: Positive for dysuria, frequency and urgency. Negative for hematuria, pelvic pain, vaginal bleeding and vaginal pain.  Musculoskeletal: Positive for arthralgias, back pain and gait problem.    Social History  Substance Use Topics  . Smoking status: Former Research scientist (life sciences)  . Smokeless tobacco: Never Used     Comment: quit 50 years ago  . Alcohol use No   Objective:   BP (!) 148/62   Pulse 78   Temp 97.6 F (36.4 C)   Resp 16   Wt 136 lb 3.2 oz (61.8 kg)   BMI 20.11 kg/m  Vitals:   04/06/17 0959  BP: (!) 148/62  Pulse: 78  Resp: 16  Temp: 97.6 F (36.4 C)  Weight: 136 lb 3.2 oz (61.8 kg)     Physical Exam  Constitutional: She is oriented to person, place, and time. She appears well-developed and well-nourished.  Cardiovascular: Normal rate and regular rhythm.   Pulmonary/Chest: Effort normal and breath sounds normal.  Abdominal: Soft. Bowel sounds are normal.  Neurological: She is alert and oriented to person, place, and time.  Skin: Skin is warm and dry.  Psychiatric: She has a normal mood and affect. Her behavior is normal.        Assessment & Plan:     1. Urinary frequency  UA today in clinic is normal. Offered to start abx empirically for UTI but patient would like to wait for culture. Encouraged her to f/u if sx worsen. Reminded of Saturday clinic.  - POCT urinalysis dipstick - Urine Culture  Return if symptoms worsen or fail to improve.  The entirety of the  information documented in the History of Present Illness, Review of Systems and Physical Exam were personally obtained by me. Portions of this information were initially documented by [facility], CMA and reviewed by me for thoroughness and accuracy.           Trinna Post, PA-C  [facility]

## 2017-04-07 LAB — URINE CULTURE
MICRO NUMBER:: 81171321
SPECIMEN QUALITY:: ADEQUATE

## 2017-04-09 ENCOUNTER — Telehealth: Payer: Self-pay

## 2017-04-09 ENCOUNTER — Ambulatory Visit (INDEPENDENT_AMBULATORY_CARE_PROVIDER_SITE_OTHER): Payer: Medicare Other | Admitting: Podiatry

## 2017-04-09 DIAGNOSIS — B351 Tinea unguium: Secondary | ICD-10-CM

## 2017-04-09 DIAGNOSIS — M79609 Pain in unspecified limb: Secondary | ICD-10-CM

## 2017-04-09 NOTE — Telephone Encounter (Signed)
-----   Message from Trinna Post, [place] sent at 04/09/2017  1:14 PM EDT ----- Urine culture showed colonizing bacteria but no pathologic infection.

## 2017-04-09 NOTE — Progress Notes (Signed)
Complaint:  Visit Type: Patient returns to my office for continued preventative foot care services. Complaint: Patient states" my nails have grown long and thick and become painful to walk and wear shoes"  The patient presents for preventative foot care services. No changes to ROS.  Patient asks me to look to see if there is corn on second toe right foot.  Podiatric Exam: Vascular: dorsalis pedis and posterior tibial pulses are palpable bilateral. Capillary return is immediate. Temperature gradient is WNL. Skin turgor WNL  Sensorium: Normal Semmes Weinstein monofilament test. Normal tactile sensation bilaterally. Nail Exam: Pt has thick disfigured discolored nails with subungual debris noted bilateral entire nail hallux through fifth toenails Ulcer Exam: There is no evidence of ulcer or pre-ulcerative changes or infection. Orthopedic Exam: Muscle tone and strength are WNL. No limitations in general ROM. No crepitus or effusions noted. Foot type and digits show no abnormalities. Bony prominences are unremarkable. Skin: No Porokeratosis. No infection or ulcers  Diagnosis:  Onychomycosis, , Pain in right toe, pain in left toes  Treatment & Plan Procedures and Treatment: Consent by patient was obtained for treatment procedures.   Debridement of mycotic and hypertrophic toenails, 1 through 5 bilateral and clearing of subungual debris. No ulceration, no infection noted. Dispense padding second toe. Return Visit-Office Procedure: Patient instructed to return to the office for a follow up visit 3 months for continued evaluation and treatment.    Gardiner Barefoot DPM

## 2017-04-09 NOTE — Telephone Encounter (Signed)
Pt advised.   Thanks,   -Laura  

## 2017-04-12 DIAGNOSIS — Z23 Encounter for immunization: Secondary | ICD-10-CM | POA: Diagnosis not present

## 2017-04-18 DIAGNOSIS — M48062 Spinal stenosis, lumbar region with neurogenic claudication: Secondary | ICD-10-CM | POA: Diagnosis not present

## 2017-04-18 DIAGNOSIS — M5416 Radiculopathy, lumbar region: Secondary | ICD-10-CM | POA: Diagnosis not present

## 2017-04-18 DIAGNOSIS — M5136 Other intervertebral disc degeneration, lumbar region: Secondary | ICD-10-CM | POA: Diagnosis not present

## 2017-04-23 DIAGNOSIS — H353211 Exudative age-related macular degeneration, right eye, with active choroidal neovascularization: Secondary | ICD-10-CM | POA: Diagnosis not present

## 2017-04-27 ENCOUNTER — Ambulatory Visit (INDEPENDENT_AMBULATORY_CARE_PROVIDER_SITE_OTHER): Payer: Medicare Other | Admitting: Physician Assistant

## 2017-04-27 ENCOUNTER — Telehealth: Payer: Self-pay

## 2017-04-27 VITALS — BP 132/86 | HR 84 | Temp 98.4°F | Resp 16 | Wt 134.0 lb

## 2017-04-27 DIAGNOSIS — M546 Pain in thoracic spine: Secondary | ICD-10-CM

## 2017-04-27 DIAGNOSIS — I25718 Atherosclerosis of autologous vein coronary artery bypass graft(s) with other forms of angina pectoris: Secondary | ICD-10-CM

## 2017-04-27 DIAGNOSIS — J432 Centrilobular emphysema: Secondary | ICD-10-CM

## 2017-04-27 MED ORDER — VALACYCLOVIR HCL 1 G PO TABS: 1000.0000 mg | ORAL_TABLET | Freq: Three times a day (TID) | ORAL | 0 refills | Status: AC

## 2017-04-27 NOTE — Progress Notes (Signed)
Patient: Chelsea [name] Female    DOB: Apr 20, 1924   83 y.o.   MRN: [id number] Visit Date: 04/27/2017  Today's Provider: Trinna Post, PA-C   Chief Complaint  Patient presents with  . Chest Pain   Subjective:    HPI  Chelsea [name] is a 83 y/o woman with history of CABG, HTN, and COPD presenting today for pain on her right side. Patient states she is having pain under right breast radiating to the right side of her back. Pain started yesterday 04/26/17. Some burning sensation in that area too, no rash, no fever. Patient states it is constant pain. No injury that patient recalls.She denies chest pain. She is not having any trouble breathing or SOB above her baseline which she says has been present since her CABG. Pain does not increase with breathing. She is not having abdominal pain. She has long standing history of constipation, however, she had a bowel movement on Wednesday and symptoms started after this.    Allergies  Allergen Reactions  . Sulfa Antibiotics     Other reaction(s): Dizziness  . Sulfonamide Derivatives Other (See Comments)    Reaction:  Dizziness      Current Outpatient Medications:  .  albuterol (PROVENTIL HFA;VENTOLIN HFA) 108 (90 Base) MCG/ACT inhaler, Inhale 2 puffs into the lungs every 6 (six) hours as needed for wheezing or shortness of breath., Disp: 1 Inhaler, Rfl: 2 .  amLODipine (NORVASC) 10 MG tablet, take 1 tablet by mouth once daily, Disp: 90 tablet, Rfl: 3 .  ascorbic acid (VITAMIN C) 250 MG tablet, Take 1 tablet by mouth daily., Disp: , Rfl:  .  aspirin EC 81 MG tablet, Take 81 mg by mouth at bedtime. 325 mg during the day for pain as needed, Disp: , Rfl:  .  atorvastatin (LIPITOR) 80 MG tablet, Take 1 tablet (80 mg total) by mouth daily., Disp: 90 tablet, Rfl: 3 .  Azelastine HCl 0.15 % SOLN, instill 2 puffs by mouth into each nostril twice a day, Disp: , Rfl: 1 .  clobetasol (TEMOVATE) 0.05 % external solution, apply to affected area  twice a day AVOID FACE, GROIN, UNDERARM, Disp: , Rfl: 0 .  denosumab (PROLIA) 60 MG/ML SOLN injection, Inject 60 mg into the skin every 6 (six) months. Administer in upper arm, thigh, or abdomen, Disp: , Rfl:  .  fluticasone furoate-vilanterol (BREO ELLIPTA) 100-25 MCG/INH AEPB, Inhale 1 puff into the lungs daily., Disp: , Rfl:  .  ipratropium (ATROVENT) 0.06 % nasal spray, instill 2 puffs into each nostril every 8 hours if needed FOR RUNNY NOSE, Disp: , Rfl: 1 .  ketoconazole (NIZORAL) 2 % shampoo, MASSAGE SHAMPOO ONTO SCALP AND LET SIT FOR 5 MINUTES THEN RINSE OUT as directed by prescriber, Disp: , Rfl: 0 .  lactulose (CHRONULAC) 10 GM/15ML solution, Take 15 mLs (10 g total) by mouth 2 (two) times daily as needed for moderate constipation., Disp: 240 mL, Rfl: 0 .  levothyroxine (SYNTHROID, LEVOTHROID) 88 MCG tablet, take 1 tablet by mouth once daily, Disp: 90 tablet, Rfl: 3 .  loratadine (CLARITIN) 10 MG tablet, take 1 tablet by mouth once daily, Disp: 90 tablet, Rfl: 1 .  metaxalone (SKELAXIN) 800 MG tablet, Take 1 tablet (800 mg total) by mouth 3 (three) times daily as needed for muscle spasms., Disp: 15 tablet, Rfl: 0 .  Multiple Vitamins-Minerals (PRESERVISION AREDS PO), Take 1 capsule by mouth daily. , Disp: , Rfl:  .  nitroGLYCERIN (NITROSTAT) 0.4 MG SL tablet, Place 1 tablet (0.4 mg total) under the tongue every 5 (five) minutes as needed., Disp: 25 tablet, Rfl: 6 .  NON FORMULARY, Eye injection as directed Right eye., Disp: , Rfl:  .  NONFORMULARY OR COMPOUNDED ITEM, [facility]  Peripheral Neuropathy Cream- Bupivacaine 1%, Doxepin 3%, Gabapentin 6%, Pentoxifylline 3%, Topiramate 1% Apply 1-2 grams to affected area 3-4 times daily Qty. 120 gm 3 refills, Disp: , Rfl:  .  oxyCODONE-acetaminophen (PERCOCET) 10-325 MG tablet, Take 1 tablet by mouth every 8 (eight) hours as needed for pain., Disp: , Rfl:  .  RA SENNA PLUS 8.6-50 MG tablet, Take 1 tablet by mouth daily., Disp: , Rfl: 1 .   sertraline (ZOLOFT) 100 MG tablet, Take 1 tablet (100 mg total) by mouth daily., Disp: 90 tablet, Rfl: 3 .  tiotropium (SPIRIVA HANDIHALER) 18 MCG inhalation capsule, Place 1 capsule (18 mcg total) into inhaler and inhale daily., Disp: 90 capsule, Rfl: 0 .  triamcinolone cream (KENALOG) 0.1 %, Apply 1 application topically 2 (two) times daily., Disp: 30 g, Rfl: 0 .  Vitamin D, Cholecalciferol, 1000 units CAPS, Take by mouth daily., Disp: , Rfl:  .  valACYclovir (VALTREX) 1000 MG tablet, Take 1 tablet (1,000 mg total) 3 (three) times daily for 7 days by mouth., Disp: 21 tablet, Rfl: 0  Review of Systems  Constitutional: Positive for fatigue.  HENT: Positive for congestion and rhinorrhea.   Respiratory: Positive for shortness of breath. Negative for apnea, cough and choking.   Cardiovascular: Negative for chest pain, palpitations and leg swelling.  Gastrointestinal: Positive for constipation. Negative for abdominal pain, diarrhea, nausea and vomiting.  Musculoskeletal: Positive for gait problem and myalgias.    Social History   Tobacco Use  . Smoking status: Former Research scientist (life sciences)  . Smokeless tobacco: Never Used  . Tobacco comment: quit 50 years ago  Substance Use Topics  . Alcohol use: No   Objective:   BP 132/86   Pulse 84   Temp 98.4 F (36.9 C)   Resp 16   Wt 134 lb (60.8 kg)   SpO2 96%   BMI 19.79 kg/m  Vitals:   04/27/17 1331  BP: 132/86  Pulse: 84  Resp: 16  Temp: 98.4 F (36.9 C)  SpO2: 96%  Weight: 134 lb (60.8 kg)     Physical Exam  Constitutional: She is oriented to person, place, and time. She appears well-developed and well-nourished.  Cardiovascular: Normal rate and regular rhythm.  Pulmonary/Chest: Effort normal. No respiratory distress. She has wheezes.  One expiratory wheeze in LUF.   Abdominal: Soft. Bowel sounds are normal. She exhibits no distension. There is no tenderness. There is no rebound and no guarding.  Musculoskeletal:  No spinous process  tenderness.   Neurological: She is alert and oriented to person, place, and time.  Skin: Skin is warm and dry. No rash noted. No erythema.  No rash in right thoracic area.   Psychiatric: She has a normal mood and affect. Her behavior is normal.        Assessment & Plan:     1. Acute right-sided thoracic back pain  Nontoxic, afebrile pt in office. Pulse ox 96%. Patient does not have gallbladder. DDx: prodrome to shingles, vertebral compression fracture, pneumonia. Patient seems to be oxygenating per her baseline, with only mild expiratory wheeze in left lung. She displays no tenderness, either bony or abdominal. Suspect this may be prodrome to shingles but have ordered CXR to assess  for bony or infectious process. Patient says she was transported here by her assisted living transport and won't be able to get CXR until Monday. I think this is OK. Have sent her home with some Valtrex should a rash emerge over the weekend. If not, then she should get CXR. Return if worsening.   - DG Chest 2 View; Future - valACYclovir (VALTREX) 1000 MG tablet; Take 1 tablet (1,000 mg total) 3 (three) times daily for 7 days by mouth.  Dispense: 21 tablet; Refill: 0  2. Atherosclrosis of autologous vein  Stable.  3. Centrilobular emphysema  Stable, breathing at baseline.  Return if symptoms worsen or fail to improve.  The entirety of the information documented in the History of Present Illness, Review of Systems and Physical Exam were personally obtained by me. Portions of this information were initially documented by [facility], CMA and reviewed by me for thoroughness and accuracy.             Trinna Post, PA-C  [facility]

## 2017-04-27 NOTE — Telephone Encounter (Signed)
Patient called C/o pain around right rib area and breast x's a few days. Patient reports it may be constipation related. Patient reports she has taken OTC medication for constipation and reports good clean out on Wednesday. Patient reports calling GI office and they recommended pt to see PCP. Patient denies any heart related issues, denies any injuries or falls.

## 2017-04-27 NOTE — Patient Instructions (Addendum)
Shingrix Vaccine for shingles, ask you pharmacist Sent in Valtrex if you have rash Please get chest xray if rash does not appear, walk in basis     Shingles Shingles is an infection that causes a painful skin rash and fluid-filled blisters. Shingles is caused by the same virus that causes chickenpox. Shingles only develops in people who:  Have had chickenpox.  Have gotten the chickenpox vaccine. (This is rare.)  The first symptoms of shingles may be itching, tingling, or pain in an area on your skin. A rash will follow in a few days or weeks. The rash is usually on one side of the body in a bandlike or beltlike pattern. Over time, the rash turns into fluid-filled blisters that break open, scab over, and dry up. Medicines may:  Help you manage pain.  Help you recover more quickly.  Help to prevent long-term problems.  Follow these instructions at home: Medicines  Take medicines only as told by your doctor.  Apply an anti-itch or numbing cream to the affected area as told by your doctor. Blister and Rash Care  Take a cool bath or put cool compresses on the area of the rash or blisters as told by your doctor. This may help with pain and itching.  Keep your rash covered with a loose bandage (dressing). Wear loose-fitting clothing.  Keep your rash and blisters clean with mild soap and cool water or as told by your doctor.  Check your rash every day for signs of infection. These include redness, swelling, and pain that lasts or gets worse.  Do not pick your blisters.  Do not scratch your rash. General instructions  Rest as told by your doctor.  Keep all follow-up visits as told by your doctor. This is important.  Until your blisters scab over, your infection can cause chickenpox in people who have never had it or been vaccinated against it. To prevent this from happening, avoid touching other people or being around other people, especially: ? Babies. ? Pregnant  women. ? Children who have eczema. ? Elderly people who have transplants. ? People who have chronic illnesses, such as leukemia or AIDS. Contact a doctor if:  Your pain does not get better with medicine.  Your pain does not get better after the rash heals.  Your rash looks infected. Signs of infection include: ? Redness. ? Swelling. ? Pain that lasts or gets worse. Get help right away if:  The rash is on your face or nose.  You have pain in your face, pain around your eye area, or loss of feeling on one side of your face.  You have ear pain or you have ringing in your ear.  You have loss of taste.  Your condition gets worse. This information is not intended to replace advice given to you by your health care provider. Make sure you discuss any questions you have with your health care provider. Document Released: 11/22/2007 Document Revised: 01/30/2016 Document Reviewed: 03/17/2014 Elsevier Interactive Patient Education  [employer].

## 2017-05-17 DIAGNOSIS — M9905 Segmental and somatic dysfunction of pelvic region: Secondary | ICD-10-CM | POA: Diagnosis not present

## 2017-05-17 DIAGNOSIS — M9903 Segmental and somatic dysfunction of lumbar region: Secondary | ICD-10-CM | POA: Diagnosis not present

## 2017-05-17 DIAGNOSIS — M5431 Sciatica, right side: Secondary | ICD-10-CM | POA: Diagnosis not present

## 2017-05-17 DIAGNOSIS — M5136 Other intervertebral disc degeneration, lumbar region: Secondary | ICD-10-CM | POA: Diagnosis not present

## 2017-05-21 DIAGNOSIS — M9903 Segmental and somatic dysfunction of lumbar region: Secondary | ICD-10-CM | POA: Diagnosis not present

## 2017-05-21 DIAGNOSIS — M9905 Segmental and somatic dysfunction of pelvic region: Secondary | ICD-10-CM | POA: Diagnosis not present

## 2017-05-21 DIAGNOSIS — M5431 Sciatica, right side: Secondary | ICD-10-CM | POA: Diagnosis not present

## 2017-05-21 DIAGNOSIS — M5136 Other intervertebral disc degeneration, lumbar region: Secondary | ICD-10-CM | POA: Diagnosis not present

## 2017-05-24 ENCOUNTER — Telehealth: Payer: Self-pay | Admitting: Physician Assistant

## 2017-05-24 NOTE — Telephone Encounter (Signed)
Chelsea [name], can you call this patient? I saw this patient about a month ago for right upper quadrant pain and I told her this may be prodrome to shingles and sent her home with Valtrex. Never confirmed she did end up having shingles. She is now calling about a rash. I cannot confirm it is shingles without looking at it. However, she might try if there are no open vesicles some OTC hydrocortisone cream. Stop if worsening rash. Have her come in if she needs anything stronger or further management.

## 2017-05-24 NOTE — Telephone Encounter (Signed)
Pt had OV on 04/27/17. Pt stated that her shingles under her breast are gone but she still has a lot of itching under her breast. Pt is requesting a call back to advise what she can try. I offered pt an appt and pt requested message be sent and if she has to come in she can tomorrow. [facility] [address]. Please advise. Thanks TNP

## 2017-05-24 NOTE — Telephone Encounter (Signed)
Pt advised.  She says she had the nurse at [facility] look at it and said she had dry skin.  She will try the Hydrocortisone cream and call if it doesn't help or gets worse.   Thanks,   -Mickel Baas

## 2017-06-08 DIAGNOSIS — M9905 Segmental and somatic dysfunction of pelvic region: Secondary | ICD-10-CM | POA: Diagnosis not present

## 2017-06-08 DIAGNOSIS — M5136 Other intervertebral disc degeneration, lumbar region: Secondary | ICD-10-CM | POA: Diagnosis not present

## 2017-06-08 DIAGNOSIS — M9903 Segmental and somatic dysfunction of lumbar region: Secondary | ICD-10-CM | POA: Diagnosis not present

## 2017-06-08 DIAGNOSIS — M5431 Sciatica, right side: Secondary | ICD-10-CM | POA: Diagnosis not present

## 2017-06-22 DIAGNOSIS — M5136 Other intervertebral disc degeneration, lumbar region: Secondary | ICD-10-CM | POA: Diagnosis not present

## 2017-06-22 DIAGNOSIS — M48062 Spinal stenosis, lumbar region with neurogenic claudication: Secondary | ICD-10-CM | POA: Diagnosis not present

## 2017-06-22 DIAGNOSIS — M5416 Radiculopathy, lumbar region: Secondary | ICD-10-CM | POA: Diagnosis not present

## 2017-06-28 DIAGNOSIS — M5431 Sciatica, right side: Secondary | ICD-10-CM | POA: Diagnosis not present

## 2017-06-28 DIAGNOSIS — M9905 Segmental and somatic dysfunction of pelvic region: Secondary | ICD-10-CM | POA: Diagnosis not present

## 2017-06-28 DIAGNOSIS — M9903 Segmental and somatic dysfunction of lumbar region: Secondary | ICD-10-CM | POA: Diagnosis not present

## 2017-06-28 DIAGNOSIS — M5136 Other intervertebral disc degeneration, lumbar region: Secondary | ICD-10-CM | POA: Diagnosis not present

## 2017-07-03 ENCOUNTER — Ambulatory Visit
Admission: RE | Admit: 2017-07-03 | Discharge: 2017-07-03 | Disposition: A | Discharge status: Home / Self Care | Payer: Medicare Other | Source: Ambulatory Visit | Attending: Physician Assistant | Admitting: Physician Assistant

## 2017-07-03 ENCOUNTER — Encounter: Payer: Self-pay | Admitting: Physician Assistant

## 2017-07-03 ENCOUNTER — Ambulatory Visit (INDEPENDENT_AMBULATORY_CARE_PROVIDER_SITE_OTHER): Payer: Medicare Other | Admitting: Physician Assistant

## 2017-07-03 VITALS — BP 120/60 | HR 81 | Temp 98.1°F | Resp 16 | Wt 127.0 lb

## 2017-07-03 DIAGNOSIS — R059 Cough, unspecified: Secondary | ICD-10-CM

## 2017-07-03 DIAGNOSIS — R05 Cough: Secondary | ICD-10-CM

## 2017-07-03 DIAGNOSIS — R0602 Shortness of breath: Secondary | ICD-10-CM

## 2017-07-03 DIAGNOSIS — J069 Acute upper respiratory infection, unspecified: Secondary | ICD-10-CM

## 2017-07-03 DIAGNOSIS — I700 Atherosclerosis of aorta: Secondary | ICD-10-CM | POA: Diagnosis not present

## 2017-07-03 LAB — POCT INFLUENZA A/B
Influenza A, POC: NEGATIVE
Influenza B, POC: NEGATIVE

## 2017-07-03 MED ORDER — LEVOFLOXACIN 250 MG PO TABS: 250.0000 mg | ORAL_TABLET | Freq: Every day | ORAL | 0 refills | Status: DC

## 2017-07-03 MED ORDER — BENZONATATE 100 MG PO CAPS: 100.0000 mg | ORAL_CAPSULE | Freq: Two times a day (BID) | ORAL | 0 refills | Status: DC | PRN

## 2017-07-03 NOTE — Progress Notes (Signed)
Patient: Chelsea [name] Female    DOB: Oct 28, 1923   84 y.o.   MRN: [id number] Visit Date: 07/03/2017  Today's Provider: Mar Daring, PA-C   Chief Complaint  Patient presents with  . Cough   Subjective:    Patient has had cough for 3 days. Patient states she also has fatigue, body aches, chills, sinus and ear pain. Patient states cough is productive.    Cough  This is a new problem. The current episode started in the past 7 days (3 days). The problem has been unchanged. The problem occurs every few minutes. The cough is productive of sputum. Associated symptoms include chills, ear congestion, ear pain, headaches, myalgias, nasal congestion, postnasal drip, rhinorrhea and shortness of breath. Pertinent negatives include no chest pain, fever, heartburn, hemoptysis, rash, sore throat, sweats, weight loss or wheezing. Nothing aggravates the symptoms. She has tried a beta-agonist inhaler for the symptoms. Her past medical history is significant for pneumonia.      Allergies  Allergen Reactions  . Sulfa Antibiotics     Other reaction(s): Dizziness  . Sulfonamide Derivatives Other (See Comments)    Reaction:  Dizziness      Current Outpatient Medications:  .  albuterol (PROVENTIL HFA;VENTOLIN HFA) 108 (90 Base) MCG/ACT inhaler, Inhale 2 puffs into the lungs every 6 (six) hours as needed for wheezing or shortness of breath., Disp: 1 Inhaler, Rfl: 2 .  amLODipine (NORVASC) 10 MG tablet, take 1 tablet by mouth once daily, Disp: 90 tablet, Rfl: 3 .  aspirin EC 81 MG tablet, Take 81 mg by mouth at bedtime. 325 mg during the day for pain as needed, Disp: , Rfl:  .  atorvastatin (LIPITOR) 80 MG tablet, Take 1 tablet (80 mg total) by mouth daily., Disp: 90 tablet, Rfl: 3 .  Azelastine HCl 0.15 % SOLN, instill 2 puffs by mouth into each nostril twice a day, Disp: , Rfl: 1 .  clobetasol (TEMOVATE) 0.05 % external solution, apply to affected area twice a day AVOID FACE, GROIN,  UNDERARM, Disp: , Rfl: 0 .  denosumab (PROLIA) 60 MG/ML SOLN injection, Inject 60 mg into the skin every 6 (six) months. Administer in upper arm, thigh, or abdomen, Disp: , Rfl:  .  fluticasone furoate-vilanterol (BREO ELLIPTA) 100-25 MCG/INH AEPB, Inhale 1 puff into the lungs daily., Disp: , Rfl:  .  ipratropium (ATROVENT) 0.06 % nasal spray, instill 2 puffs into each nostril every 8 hours if needed FOR RUNNY NOSE, Disp: , Rfl: 1 .  ketoconazole (NIZORAL) 2 % shampoo, MASSAGE SHAMPOO ONTO SCALP AND LET SIT FOR 5 MINUTES THEN RINSE OUT as directed by prescriber, Disp: , Rfl: 0 .  lactulose (CHRONULAC) 10 GM/15ML solution, Take 15 mLs (10 g total) by mouth 2 (two) times daily as needed for moderate constipation., Disp: 240 mL, Rfl: 0 .  levothyroxine (SYNTHROID, LEVOTHROID) 88 MCG tablet, take 1 tablet by mouth once daily, Disp: 90 tablet, Rfl: 3 .  loratadine (CLARITIN) 10 MG tablet, take 1 tablet by mouth once daily, Disp: 90 tablet, Rfl: 1 .  metaxalone (SKELAXIN) 800 MG tablet, Take 1 tablet (800 mg total) by mouth 3 (three) times daily as needed for muscle spasms., Disp: 15 tablet, Rfl: 0 .  Multiple Vitamins-Minerals (PRESERVISION AREDS PO), Take 1 capsule by mouth daily. , Disp: , Rfl:  .  nitroGLYCERIN (NITROSTAT) 0.4 MG SL tablet, Place 1 tablet (0.4 mg total) under the tongue every 5 (five) minutes as needed.,  Disp: 25 tablet, Rfl: 6 .  NON FORMULARY, Eye injection as directed Right eye., Disp: , Rfl:  .  NONFORMULARY OR COMPOUNDED ITEM, [facility]  Peripheral Neuropathy Cream- Bupivacaine 1%, Doxepin 3%, Gabapentin 6%, Pentoxifylline 3%, Topiramate 1% Apply 1-2 grams to affected area 3-4 times daily Qty. 120 gm 3 refills, Disp: , Rfl:  .  sertraline (ZOLOFT) 100 MG tablet, Take 1 tablet (100 mg total) by mouth daily., Disp: 90 tablet, Rfl: 3 .  tiotropium (SPIRIVA HANDIHALER) 18 MCG inhalation capsule, Place 1 capsule (18 mcg total) into inhaler and inhale daily., Disp: 90 capsule,  Rfl: 0 .  triamcinolone cream (KENALOG) 0.1 %, Apply 1 application topically 2 (two) times daily., Disp: 30 g, Rfl: 0 .  Vitamin D, Cholecalciferol, 1000 units CAPS, Take by mouth daily., Disp: , Rfl:  .  ascorbic acid (VITAMIN C) 250 MG tablet, Take 1 tablet by mouth daily., Disp: , Rfl:  .  oxyCODONE-acetaminophen (PERCOCET) 10-325 MG tablet, Take 1 tablet by mouth every 8 (eight) hours as needed for pain., Disp: , Rfl:  .  RA SENNA PLUS 8.6-50 MG tablet, Take 1 tablet by mouth daily., Disp: , Rfl: 1  Review of Systems  Constitutional: Positive for chills and fatigue. Negative for appetite change, fever and weight loss.  HENT: Positive for congestion, ear pain, postnasal drip, rhinorrhea, sinus pressure and sinus pain. Negative for sore throat.   Respiratory: Positive for cough and shortness of breath. Negative for hemoptysis, chest tightness and wheezing.   Cardiovascular: Negative for chest pain and palpitations.  Gastrointestinal: Negative for abdominal pain, heartburn, nausea and vomiting.  Musculoskeletal: Positive for myalgias.  Skin: Negative for rash.  Neurological: Positive for headaches. Negative for dizziness and weakness.    Social History   Tobacco Use  . Smoking status: Former Research scientist (life sciences)  . Smokeless tobacco: Never Used  . Tobacco comment: quit 50 years ago  Substance Use Topics  . Alcohol use: No   Objective:   BP 120/60 (BP Location: Left Arm, Patient Position: Sitting, Cuff Size: Normal)   Pulse 81   Temp 98.1 F (36.7 C) (Oral)   Resp 16   Wt 127 lb (57.6 kg)   SpO2 96%   BMI 18.75 kg/m  Vitals:   07/03/17 1124  BP: 120/60  Pulse: 81  Resp: 16  Temp: 98.1 F (36.7 C)  TempSrc: Oral  SpO2: 96%  Weight: 127 lb (57.6 kg)     Physical Exam  Constitutional: She appears well-developed and well-nourished. No distress.  HENT:  Head: Normocephalic and atraumatic.  Right Ear: Hearing, tympanic membrane, external ear and ear canal normal.  Left Ear:  Hearing, tympanic membrane, external ear and ear canal normal.  Nose: Nose normal.  Mouth/Throat: Uvula is midline, oropharynx is clear and moist and mucous membranes are normal. No oropharyngeal exudate.  Eyes: Conjunctivae are normal. Pupils are equal, round, and reactive to light. Right eye exhibits no discharge. Left eye exhibits no discharge. No scleral icterus.  Neck: Normal range of motion. Neck supple. No tracheal deviation present. No thyromegaly present.  Cardiovascular: Normal rate, regular rhythm and normal heart sounds. Exam reveals no gallop and no friction rub.  No murmur heard. Pulmonary/Chest: Effort normal. No stridor. No respiratory distress. She has no decreased breath sounds. She has no wheezes. She has no rhonchi. She has rales (rales heard in LULF did clear with cough) in the left upper field.  Lymphadenopathy:    She has no cervical adenopathy.  Skin: Skin  is warm and dry. She is not diaphoretic.  Vitals reviewed.      Assessment & Plan:     1. Cough Flu test negative. Will order CXR as below as I am suspicious for pneumonia. Patient is maintaining O2 sats at this time. Continue all her inhalers (Breo, Spiriva, and albuterol rescue prn). Tessalon perles given for cough as below. I will also send in levaquin as below for her to start if CXR reveals pneumonia. She is also to call if symptoms worsen or if she develops worsening SOB.  - POCT Influenza A/B - DG Chest 2 View; Future - benzonatate (TESSALON) 100 MG capsule; Take 1 capsule (100 mg total) by mouth 2 (two) times daily as needed for cough.  Dispense: 20 capsule; Refill: 0  2. SOB (shortness of breath) See above medical treatment plan. - DG Chest 2 View; Future  3. Upper respiratory tract infection, unspecified type See above medical treatment plan. - levofloxacin (LEVAQUIN) 250 MG tablet; Take 1 tablet (250 mg total) by mouth daily.  Dispense: 10 tablet; Refill: 0       Mar Daring, PA-C    [facility]

## 2017-07-09 ENCOUNTER — Ambulatory Visit: Payer: Medicare Other | Admitting: Podiatry

## 2017-07-12 ENCOUNTER — Ambulatory Visit: Payer: Medicare Other | Admitting: Podiatry

## 2017-07-17 DIAGNOSIS — H353211 Exudative age-related macular degeneration, right eye, with active choroidal neovascularization: Secondary | ICD-10-CM | POA: Diagnosis not present

## 2017-07-26 ENCOUNTER — Ambulatory Visit: Payer: Medicare Other | Admitting: Podiatry

## 2017-07-26 DIAGNOSIS — H1820 Unspecified corneal edema: Secondary | ICD-10-CM | POA: Diagnosis not present

## 2017-07-30 ENCOUNTER — Encounter: Payer: Self-pay | Admitting: Podiatry

## 2017-07-30 ENCOUNTER — Ambulatory Visit (INDEPENDENT_AMBULATORY_CARE_PROVIDER_SITE_OTHER): Payer: Medicare Other | Admitting: Podiatry

## 2017-07-30 DIAGNOSIS — B351 Tinea unguium: Secondary | ICD-10-CM | POA: Diagnosis not present

## 2017-07-30 DIAGNOSIS — M79609 Pain in unspecified limb: Secondary | ICD-10-CM | POA: Diagnosis not present

## 2017-07-30 NOTE — Progress Notes (Signed)
Complaint:  Visit Type: Patient returns to my office for continued preventative foot care services. Complaint: Patient states" my nails have grown long and thick and become painful to walk and wear shoes"  The patient presents for preventative foot care services. No changes to ROS.  Patient asks me to look to see if there is corn on second toe right foot.  Podiatric Exam: Vascular: dorsalis pedis and posterior tibial pulses are palpable bilateral. Capillary return is immediate. Temperature gradient is WNL. Skin turgor WNL  Sensorium: Normal Semmes Weinstein monofilament test. Normal tactile sensation bilaterally. Nail Exam: Pt has thick disfigured discolored nails with subungual debris noted bilateral entire nail hallux through fifth toenails Ulcer Exam: There is no evidence of ulcer or pre-ulcerative changes or infection. Orthopedic Exam: Muscle tone and strength are WNL. No limitations in general ROM. No crepitus or effusions noted. Foot type and digits show no abnormalities. Bony prominences are unremarkable. Skin: No Porokeratosis. No infection or ulcers  Diagnosis:  Onychomycosis, , Pain in right toe, pain in left toes  Treatment & Plan Procedures and Treatment: Consent by patient was obtained for treatment procedures.   Debridement of mycotic and hypertrophic toenails, 1 through 5 bilateral and clearing of subungual debris. No ulceration, no infection noted.  Return Visit-Office Procedure: Patient instructed to return to the office for a follow up visit 3 months for continued evaluation and treatment.    Gardiner Barefoot DPM

## 2017-08-02 DIAGNOSIS — M5136 Other intervertebral disc degeneration, lumbar region: Secondary | ICD-10-CM | POA: Diagnosis not present

## 2017-08-02 DIAGNOSIS — M9903 Segmental and somatic dysfunction of lumbar region: Secondary | ICD-10-CM | POA: Diagnosis not present

## 2017-08-02 DIAGNOSIS — M5431 Sciatica, right side: Secondary | ICD-10-CM | POA: Diagnosis not present

## 2017-08-02 DIAGNOSIS — M9905 Segmental and somatic dysfunction of pelvic region: Secondary | ICD-10-CM | POA: Diagnosis not present

## 2017-08-20 ENCOUNTER — Encounter: Payer: Self-pay | Admitting: Family Medicine

## 2017-08-20 ENCOUNTER — Ambulatory Visit (INDEPENDENT_AMBULATORY_CARE_PROVIDER_SITE_OTHER): Payer: Medicare Other | Admitting: Family Medicine

## 2017-08-20 VITALS — BP 122/58 | HR 80 | Temp 97.8°F | Resp 16 | Wt 129.0 lb

## 2017-08-20 DIAGNOSIS — J069 Acute upper respiratory infection, unspecified: Secondary | ICD-10-CM | POA: Diagnosis not present

## 2017-08-20 NOTE — Progress Notes (Signed)
Subjective:     Patient ID: Chelsea [name], female   DOB: [birth year], 84 y.o.   MRN: [id number] Chief Complaint  Patient presents with  . URI    Patient c/o cough and congestion X 3 days. She has not used anything OTC for her symptoms. She reports that both ears feel plugged and her sinus are full. She denies having any fever.    HPI Reports she is having no cough or difficulty breathing. States she is compliant with her respiratory sx. Has a f/u with her pulmonary doctor, Dr. Raul Del in two weeks per her report.  Review of Systems     Objective:   Physical Exam  Constitutional: She appears well-developed and well-nourished. No distress (ambulating with a walker).  Ears: T.M's intact without inflammation Sinuses: non-tender Throat: tonsils absent without erythema Neck: no cervical adenopathy Lungs: clear     Assessment:    1. Viral upper respiratory tract infection    Plan:    Discussed sx treatment. Phone f/u if not improving later this week.

## 2017-08-20 NOTE — Patient Instructions (Addendum)
Discussed use of Sudafed PE and salt water nasal spray for congestion. Try an expectorant like Mucinex or Robitussin. Try Delsym for cough. Call us on Thursday or Friday if your sinuses are not improved. Expect to develop a cough. May wish to postpone your back injection for now.

## 2017-08-21 ENCOUNTER — Ambulatory Visit: Payer: Self-pay

## 2017-08-28 ENCOUNTER — Ambulatory Visit (INDEPENDENT_AMBULATORY_CARE_PROVIDER_SITE_OTHER): Payer: Medicare Other

## 2017-08-28 VITALS — BP 132/58 | HR 72 | Temp 98.3°F | Ht 65.0 in

## 2017-08-28 DIAGNOSIS — Z0000 Encounter for general adult medical examination without abnormal findings: Secondary | ICD-10-CM

## 2017-08-28 NOTE — Progress Notes (Signed)
Subjective:   Chelsea [name] is a 84 y.o. female who presents for Medicare Annual (Subsequent) preventive examination.  Review of Systems:  N/A  Cardiac Risk Factors include: advanced age (>60men, >12 women);dyslipidemia;hypertension     Objective:     Vitals: BP (!) 132/58 (BP Location: Left Arm)   Pulse 72   Temp 98.3 F (36.8 C) (Oral)   Ht 5\' 5"  (1.651 m)   BMI 21.47 kg/m   Body mass index is 21.47 kg/m.  Advanced Directives 08/28/2017 12/21/2016 02/16/2016 11/11/2015 08/05/2015 06/14/2015 04/30/2015  Does Patient Have a Medical Advance Directive? Yes No Yes Yes Yes No Yes  Type of Paramedic of [place];Living will - - [place];Living will - - [place];Living will  Does patient want to make changes to medical advance directive? - - - - - - -  Copy of [place] in Chart? No - copy requested - Yes - - - -    Tobacco Social History   Tobacco Use  Smoking Status Former Smoker  Smokeless Tobacco Never Used  Tobacco Comment   quit 50 years ago     Counseling given: Not Answered Comment: quit 50 years ago   Clinical Intake:  Pre-visit preparation completed: Yes  Pain : No/denies pain Pain Score: 0-No pain     Nutritional Risks: None Diabetes: No  How often do you need to have someone help you when you read instructions, pamphlets, or other written materials from your doctor or pharmacy?: 1 - Never  Interpreter Needed?: No  Information entered by :: [facility], LPN  Past Medical History:  Diagnosis Date  . Asthma   . Bradycardia    hx of it and fatigue with beta blockade  . Carotid artery disease ([place])    s/p carotid endarterectomy  . Coronary artery disease    a. s/p 2 vessel CABG 1989; b. echo 2003: nl; c. cath 2006: patent grafts, EF 65%; d. nuclear stress test 2007: no ischemia, EF 81%  . Degenerative disc disease   . History of hyperkalemia   . Hyperlipidemia   .  Hypertension   . Mitral valve prolapse   . Norovirus 2014  . Osteoporosis   . Spinal stenosis in cervical region   . Stroke [facility]) 1980s   left brain   Past Surgical History:  Procedure Laterality Date  . ABDOMINAL HYSTERECTOMY    . BLADDER SUSPENSION    . CAROTID ENDARTERECTOMY     left  . CHOLECYSTECTOMY    . CORONARY ARTERY BYPASS GRAFT    . HIP SURGERY  02/2011   right   Family History  Problem Relation Age of Onset  . Throat cancer Mother   . Stroke Father    Social History   Socioeconomic History  . Marital status: Widowed    Spouse name: None  . Number of children: 1  . Years of education: None  . Highest education level: Some college, no degree  Social Needs  . Financial resource strain: Not hard at all  . Food insecurity - worry: Never true  . Food insecurity - inability: Never true  . Transportation needs - medical: No  . Transportation needs - non-medical: No  Occupational History  . Occupation: retired  Tobacco Use  . Smoking status: Former Research scientist (life sciences)  . Smokeless tobacco: Never Used  . Tobacco comment: quit 50 years ago  Substance and Sexual Activity  . Alcohol use: No  . Drug  use: No  . Sexual activity: No  Other Topics Concern  . None  Social History Narrative  . None    Outpatient Encounter Medications as of 08/28/2017  Medication Sig  . albuterol (PROVENTIL HFA;VENTOLIN HFA) 108 (90 Base) MCG/ACT inhaler Inhale 2 puffs into the lungs every 6 (six) hours as needed for wheezing or shortness of breath.  Marland Kitchen amLODipine (NORVASC) 10 MG tablet take 1 tablet by mouth once daily  . ascorbic acid (VITAMIN C) 250 MG tablet Take 1 tablet by mouth daily.  Marland Kitchen aspirin EC 81 MG tablet Take 81 mg by mouth at bedtime. 325 mg during the day for pain as needed  . atorvastatin (LIPITOR) 80 MG tablet Take 1 tablet (80 mg total) by mouth daily.  . Azelastine HCl 0.15 % SOLN instill 2 puffs by mouth into each nostril twice a day  . benzonatate (TESSALON) 100 MG capsule  Take 1 capsule (100 mg total) by mouth 2 (two) times daily as needed for cough.  . clobetasol (TEMOVATE) 0.05 % external solution apply to affected area twice a day AVOID FACE, GROIN, UNDERARM  . denosumab (PROLIA) 60 MG/ML SOLN injection Inject 60 mg into the skin every 6 (six) months. Administer in upper arm, thigh, or abdomen  . fluticasone furoate-vilanterol (BREO ELLIPTA) 100-25 MCG/INH AEPB Inhale 1 puff into the lungs daily.  Marland Kitchen guaiFENesin (ROBITUSSIN) 100 MG/5ML liquid Take 200 mg by mouth 3 (three) times daily as needed for cough.  Marland Kitchen ipratropium (ATROVENT) 0.06 % nasal spray instill 2 puffs into each nostril every 8 hours if needed FOR RUNNY NOSE  . ketoconazole (NIZORAL) 2 % shampoo MASSAGE SHAMPOO ONTO SCALP AND LET SIT FOR 5 MINUTES THEN RINSE OUT as directed by prescriber  . lactulose (CHRONULAC) 10 GM/15ML solution Take 15 mLs (10 g total) by mouth 2 (two) times daily as needed for moderate constipation.  Marland Kitchen levofloxacin (LEVAQUIN) 250 MG tablet Take 1 tablet (250 mg total) by mouth daily.  Marland Kitchen levothyroxine (SYNTHROID, LEVOTHROID) 88 MCG tablet take 1 tablet by mouth once daily  . loratadine (CLARITIN) 10 MG tablet take 1 tablet by mouth once daily  . metaxalone (SKELAXIN) 800 MG tablet Take 1 tablet (800 mg total) by mouth 3 (three) times daily as needed for muscle spasms.  . Multiple Vitamins-Minerals (PRESERVISION AREDS PO) Take 1 capsule by mouth daily.   . nitroGLYCERIN (NITROSTAT) 0.4 MG SL tablet Place 1 tablet (0.4 mg total) under the tongue every 5 (five) minutes as needed.  . NON FORMULARY Eye injection as directed Right eye.  . NONFORMULARY OR COMPOUNDED ITEM [facility]  Peripheral Neuropathy Cream- Bupivacaine 1%, Doxepin 3%, Gabapentin 6%, Pentoxifylline 3%, Topiramate 1% Apply 1-2 grams to affected area 3-4 times daily Qty. 120 gm 3 refills  . oxyCODONE-acetaminophen (PERCOCET) 10-325 MG tablet Take 1 tablet by mouth every 8 (eight) hours as needed for pain.  .  phenylephrine (SUDAFED PE) 10 MG TABS tablet Take 10 mg by mouth every 4 (four) hours as needed.  Marland Kitchen RA SENNA PLUS 8.6-50 MG tablet Take 1 tablet by mouth daily.  . sertraline (ZOLOFT) 100 MG tablet Take 1 tablet (100 mg total) by mouth daily.  Marland Kitchen tiotropium (SPIRIVA HANDIHALER) 18 MCG inhalation capsule Place 1 capsule (18 mcg total) into inhaler and inhale daily.  Marland Kitchen triamcinolone cream (KENALOG) 0.1 % Apply 1 application topically 2 (two) times daily.  . Vitamin D, Cholecalciferol, 1000 units CAPS Take by mouth daily.   No facility-administered encounter medications on file  as of 08/28/2017.     Activities of Daily Living In your present state of health, do you have any difficulty performing the following activities: 08/28/2017  Hearing? Y  Comment Wears bilateral hearing aids.  Vision? Y  Comment Due to Macular Degeneration.  Difficulty concentrating or making decisions? Y  Walking or climbing stairs? Y  Comment Due to SOB - avoids steps completely.  Dressing or bathing? N  Doing errands, shopping? N  Preparing Food and eating ? N  Using the Toilet? N  In the past six months, have you accidently leaked urine? N  Do you have problems with loss of bowel control? N  Managing your Medications? N  Managing your Finances? N  Housekeeping or managing your Housekeeping? Y  Some recent data might be hidden    Patient Care Team: Mar Daring, PA-C as PCP - General (Family Medicine) Rockey Situ, Kathlene November, MD as Consulting Physician (Cardiology) Pa, [facility] as Consulting Physician [facility])    Assessment:   This is a routine wellness examination for Ariannah.  Exercise Activities and Dietary recommendations Current Exercise Habits: The patient does not participate in regular exercise at present  Goals    . DIET - INCREASE WATER INTAKE     Recommend increasing water intake of 4-6 glasses of water a day.        Fall Risk Fall Risk  08/28/2017 09/01/2016 04/30/2015    Falls in the past year? No No No   Is the patient's home free of loose throw rugs in walkways, pet beds, electrical cords, etc?   yes      Grab bars in the bathroom? yes      Handrails on the stairs?   yes      Adequate lighting?   yes  Timed Get Up and Go performed: N/A  Depression Screen PHQ 2/9 Scores 08/28/2017 03/22/2017 09/01/2016 04/30/2015  PHQ - 2 Score 3 2 2 2   PHQ- 9 Score 13 2 12 4      Cognitive Function: Pt declined screening today.         Immunization History  Administered Date(s) Administered  . Influenza Split 03/18/2012  . Influenza Whole 03/27/2017  . Influenza, High Dose Seasonal PF 04/06/2014  . Influenza-Unspecified 03/19/2013, 04/13/2016  . Td 07/26/2007  . Zoster 07/27/2006    Qualifies for Shingles Vaccine? Pt declines today.   Screening Tests Health Maintenance  Topic Date Due  . PNA vac Low Risk Adult (1 of 2 - PCV13) 01/29/1991  . TETANUS/TDAP  07/25/2017  . INFLUENZA VACCINE  Completed  . DEXA SCAN  Completed    Cancer Screenings: Lung: Low Dose CT Chest recommended if Age 38-80 years, 30 pack-year currently smoking OR have quit w/in 15years. Patient does not qualify. Breast:  Up to date on Mammogram? N/A Up to date of Bone Density/Dexa? Yes Colorectal: N/A  Additional Screenings:  Hepatitis C Screening: N/A     Plan:  I have personally reviewed and addressed the Medicare Annual Wellness questionnaire and have noted the following in the patient's chart:  A. Medical and social history B. Use of alcohol, tobacco or illicit drugs  C. Current medications and supplements D. Functional ability and status E.  Nutritional status F.  Physical activity G. Advance directives H. List of other physicians I.  Hospitalizations, surgeries, and ER visits in previous 12 months J.  [place] such as hearing and vision if needed, cognitive and depression L. Referrals and appointments - none  In addition, I have reviewed and  discussed with patient certain preventive protocols, quality metrics, and best practice recommendations. A written personalized care plan for preventive services as well as general preventive health recommendations were provided to patient.  See attached scanned questionnaire for additional information.   Signed,  Fabio Neighbors, LPN Nurse Health Advisor   Nurse Recommendations: Pt declined the tetanus and Prevnar 13 vaccines today. Pt to check her immunization records at home to see if she has received the Prevnar vaccine in the past. Will f/u with Tawanna Sat at her next OV.

## 2017-08-28 NOTE — Patient Instructions (Signed)
Ms. Chelsea [name] , Thank you for taking time to come for your Medicare Wellness Visit. I appreciate your ongoing commitment to your health goals. Please review the following plan we discussed and let me know if I can assist you in the future.   Screening recommendations/referrals: Colonoscopy: N/A Mammogram: N/A Bone Density: Up to date Recommended yearly ophthalmology/optometry visit for glaucoma screening and checkup Recommended yearly dental visit for hygiene and checkup  Vaccinations: Influenza vaccine: Up to date Pneumococcal vaccine: Pneumovax received, pt to check records to see if Prevnar 43 was given in the past. Tdap vaccine: Pt declines today.  Shingles vaccine: Pt declines today.     Advanced directives: Please bring a copy of your POA (Power of Attorney) and/or Living Will to your next appointment.   Conditions/risks identified: Recommend increasing water intake to 4-6 glasses a day.  Next appointment: 10/01/17 @ 2:00 PM with Fenton Malling.    Preventive Care 55 Years and Older, Female Preventive care refers to lifestyle choices and visits with your health care provider that can promote health and wellness. What does preventive care include?  A yearly physical exam. This is also called an annual well check.  Dental exams once or twice a year.  Routine eye exams. Ask your health care provider how often you should have your eyes checked.  Personal lifestyle choices, including:  Daily care of your teeth and gums.  Regular physical activity.  Eating a healthy diet.  Avoiding tobacco and drug use.  Limiting alcohol use.  Practicing safe sex.  Taking low-dose aspirin every day.  Taking vitamin and mineral supplements as recommended by your health care provider. What happens during an annual well check? The services and screenings done by your health care provider during your annual well check will depend on your age, overall health, lifestyle risk factors, and  family history of disease. Counseling  Your health care provider may ask you questions about your:  Alcohol use.  Tobacco use.  Drug use.  Emotional well-being.  Home and relationship well-being.  Sexual activity.  Eating habits.  History of falls.  Memory and ability to understand (cognition).  Work and work Statistician.  Reproductive health. Screening  You may have the following tests or measurements:  Height, weight, and BMI.  Blood pressure.  Lipid and cholesterol levels. These may be checked every 5 years, or more frequently if you are over 74 years old.  Skin check.  Lung cancer screening. You may have this screening every year starting at age 69 if you have a 30-pack-year history of smoking and currently smoke or have quit within the past 15 years.  Fecal occult blood test (FOBT) of the stool. You may have this test every year starting at age 47.  Flexible sigmoidoscopy or colonoscopy. You may have a sigmoidoscopy every 5 years or a colonoscopy every 10 years starting at age 70.  Hepatitis C blood test.  Hepatitis B blood test.  Sexually transmitted disease (STD) testing.  Diabetes screening. This is done by checking your blood sugar (glucose) after you have not eaten for a while (fasting). You may have this done every 1-3 years.  Bone density scan. This is done to screen for osteoporosis. You may have this done starting at age 3.  Mammogram. This may be done every 1-2 years. Talk to your health care provider about how often you should have regular mammograms. Talk with your health care provider about your test results, treatment options, and if necessary, the need for  more tests. Vaccines  Your health care provider may recommend certain vaccines, such as:  Influenza vaccine. This is recommended every year.  Tetanus, diphtheria, and acellular pertussis (Tdap, Td) vaccine. You may need a Td booster every 10 years.  Zoster vaccine. You may need this  after age 14.  Pneumococcal 13-valent conjugate (PCV13) vaccine. One dose is recommended after age 53.  Pneumococcal polysaccharide (PPSV23) vaccine. One dose is recommended after age 74. Talk to your health care provider about which screenings and vaccines you need and how often you need them. This information is not intended to replace advice given to you by your health care provider. Make sure you discuss any questions you have with your health care provider. Document Released: 07/02/2015 Document Revised: 02/23/2016 Document Reviewed: 04/06/2015 Elsevier Interactive Patient Education  2017 [place] Prevention in the Home Falls can cause injuries. They can happen to people of all ages. There are many things you can do to make your home safe and to help prevent falls. What can I do on the outside of my home?  Regularly fix the edges of walkways and driveways and fix any cracks.  Remove anything that might make you trip as you walk through a door, such as a raised step or threshold.  Trim any bushes or trees on the path to your home.  Use bright outdoor lighting.  Clear any walking paths of anything that might make someone trip, such as rocks or tools.  Regularly check to see if handrails are loose or broken. Make sure that both sides of any steps have handrails.  Any raised decks and porches should have guardrails on the edges.  Have any leaves, snow, or ice cleared regularly.  Use sand or salt on walking paths during winter.  Clean up any spills in your garage right away. This includes oil or grease spills. What can I do in the bathroom?  Use night lights.  Install grab bars by the toilet and in the tub and shower. Do not use towel bars as grab bars.  Use non-skid mats or decals in the tub or shower.  If you need to sit down in the shower, use a plastic, non-slip stool.  Keep the floor dry. Clean up any water that spills on the floor as soon as it  happens.  Remove soap buildup in the tub or shower regularly.  Attach bath mats securely with double-sided non-slip rug tape.  Do not have throw rugs and other things on the floor that can make you trip. What can I do in the bedroom?  Use night lights.  Make sure that you have a light by your bed that is easy to reach.  Do not use any sheets or blankets that are too big for your bed. They should not hang down onto the floor.  Have a firm chair that has side arms. You can use this for support while you get dressed.  Do not have throw rugs and other things on the floor that can make you trip. What can I do in the kitchen?  Clean up any spills right away.  Avoid walking on wet floors.  Keep items that you use a lot in easy-to-reach places.  If you need to reach something above you, use a strong step stool that has a grab bar.  Keep electrical cords out of the way.  Do not use floor polish or wax that makes floors slippery. If you must use wax, use non-skid  floor wax.  Do not have throw rugs and other things on the floor that can make you trip. What can I do with my stairs?  Do not leave any items on the stairs.  Make sure that there are handrails on both sides of the stairs and use them. Fix handrails that are broken or loose. Make sure that handrails are as long as the stairways.  Check any carpeting to make sure that it is firmly attached to the stairs. Fix any carpet that is loose or worn.  Avoid having throw rugs at the top or bottom of the stairs. If you do have throw rugs, attach them to the floor with carpet tape.  Make sure that you have a light switch at the top of the stairs and the bottom of the stairs. If you do not have them, ask someone to add them for you. What else can I do to help prevent falls?  Wear shoes that:  Do not have high heels.  Have rubber bottoms.  Are comfortable and fit you well.  Are closed at the toe. Do not wear sandals.  If you  use a stepladder:  Make sure that it is fully opened. Do not climb a closed stepladder.  Make sure that both sides of the stepladder are locked into place.  Ask someone to hold it for you, if possible.  Clearly mark and make sure that you can see:  Any grab bars or handrails.  First and last steps.  Where the edge of each step is.  Use tools that help you move around (mobility aids) if they are needed. These include:  Canes.  Walkers.  Scooters.  Crutches.  Turn on the lights when you go into a dark area. Replace any light bulbs as soon as they burn out.  Set up your furniture so you have a clear path. Avoid moving your furniture around.  If any of your floors are uneven, fix them.  If there are any pets around you, be aware of where they are.  Review your medicines with your doctor. Some medicines can make you feel dizzy. This can increase your chance of falling. Ask your doctor what other things that you can do to help prevent falls. This information is not intended to replace advice given to you by your health care provider. Make sure you discuss any questions you have with your health care provider. Document Released: 04/01/2009 Document Revised: 11/11/2015 Document Reviewed: 07/10/2014 Elsevier Interactive Patient Education  2017 [employer].

## 2017-08-29 ENCOUNTER — Other Ambulatory Visit: Payer: Self-pay | Admitting: Physician Assistant

## 2017-08-30 DIAGNOSIS — M9905 Segmental and somatic dysfunction of pelvic region: Secondary | ICD-10-CM | POA: Diagnosis not present

## 2017-08-30 DIAGNOSIS — M9903 Segmental and somatic dysfunction of lumbar region: Secondary | ICD-10-CM | POA: Diagnosis not present

## 2017-08-30 DIAGNOSIS — M5431 Sciatica, right side: Secondary | ICD-10-CM | POA: Diagnosis not present

## 2017-08-30 DIAGNOSIS — M5136 Other intervertebral disc degeneration, lumbar region: Secondary | ICD-10-CM | POA: Diagnosis not present

## 2017-09-06 DIAGNOSIS — E559 Vitamin D deficiency, unspecified: Secondary | ICD-10-CM | POA: Diagnosis not present

## 2017-09-06 DIAGNOSIS — E039 Hypothyroidism, unspecified: Secondary | ICD-10-CM | POA: Diagnosis not present

## 2017-09-06 DIAGNOSIS — M810 Age-related osteoporosis without current pathological fracture: Secondary | ICD-10-CM | POA: Diagnosis not present

## 2017-09-11 DIAGNOSIS — J849 Interstitial pulmonary disease, unspecified: Secondary | ICD-10-CM | POA: Diagnosis not present

## 2017-09-11 DIAGNOSIS — R0602 Shortness of breath: Secondary | ICD-10-CM | POA: Diagnosis not present

## 2017-09-11 DIAGNOSIS — J449 Chronic obstructive pulmonary disease, unspecified: Secondary | ICD-10-CM | POA: Diagnosis not present

## 2017-09-24 ENCOUNTER — Other Ambulatory Visit: Payer: Self-pay

## 2017-09-24 ENCOUNTER — Encounter: Payer: Self-pay | Admitting: Radiology

## 2017-09-24 ENCOUNTER — Emergency Department: Payer: Medicare Other

## 2017-09-24 ENCOUNTER — Inpatient Hospital Stay
Admission: EM | Admit: 2017-09-24 | Discharge: 2017-09-27 | DRG: 872 | Disposition: A | Discharge status: Home Health | Payer: Medicare Other | Attending: Internal Medicine | Admitting: Internal Medicine

## 2017-09-24 DIAGNOSIS — K449 Diaphragmatic hernia without obstruction or gangrene: Secondary | ICD-10-CM | POA: Diagnosis not present

## 2017-09-24 DIAGNOSIS — I2510 Atherosclerotic heart disease of native coronary artery without angina pectoris: Secondary | ICD-10-CM | POA: Diagnosis present

## 2017-09-24 DIAGNOSIS — M4802 Spinal stenosis, cervical region: Secondary | ICD-10-CM | POA: Diagnosis present

## 2017-09-24 DIAGNOSIS — A419 Sepsis, unspecified organism: Secondary | ICD-10-CM

## 2017-09-24 DIAGNOSIS — Z96641 Presence of right artificial hip joint: Secondary | ICD-10-CM | POA: Diagnosis present

## 2017-09-24 DIAGNOSIS — M6281 Muscle weakness (generalized): Secondary | ICD-10-CM | POA: Diagnosis not present

## 2017-09-24 DIAGNOSIS — N3001 Acute cystitis with hematuria: Secondary | ICD-10-CM

## 2017-09-24 DIAGNOSIS — N390 Urinary tract infection, site not specified: Secondary | ICD-10-CM | POA: Diagnosis present

## 2017-09-24 DIAGNOSIS — Z87891 Personal history of nicotine dependence: Secondary | ICD-10-CM | POA: Diagnosis not present

## 2017-09-24 DIAGNOSIS — Z882 Allergy status to sulfonamides status: Secondary | ICD-10-CM | POA: Diagnosis not present

## 2017-09-24 DIAGNOSIS — Z79899 Other long term (current) drug therapy: Secondary | ICD-10-CM | POA: Diagnosis not present

## 2017-09-24 DIAGNOSIS — Z7951 Long term (current) use of inhaled steroids: Secondary | ICD-10-CM

## 2017-09-24 DIAGNOSIS — J45909 Unspecified asthma, uncomplicated: Secondary | ICD-10-CM | POA: Diagnosis present

## 2017-09-24 DIAGNOSIS — Z8673 Personal history of transient ischemic attack (TIA), and cerebral infarction without residual deficits: Secondary | ICD-10-CM | POA: Diagnosis not present

## 2017-09-24 DIAGNOSIS — I341 Nonrheumatic mitral (valve) prolapse: Secondary | ICD-10-CM | POA: Diagnosis present

## 2017-09-24 DIAGNOSIS — R509 Fever, unspecified: Secondary | ICD-10-CM | POA: Diagnosis not present

## 2017-09-24 DIAGNOSIS — E785 Hyperlipidemia, unspecified: Secondary | ICD-10-CM | POA: Diagnosis present

## 2017-09-24 DIAGNOSIS — E039 Hypothyroidism, unspecified: Secondary | ICD-10-CM | POA: Diagnosis present

## 2017-09-24 DIAGNOSIS — Z951 Presence of aortocoronary bypass graft: Secondary | ICD-10-CM

## 2017-09-24 DIAGNOSIS — E872 Acidosis: Secondary | ICD-10-CM | POA: Diagnosis present

## 2017-09-24 DIAGNOSIS — Z66 Do not resuscitate: Secondary | ICD-10-CM | POA: Diagnosis present

## 2017-09-24 DIAGNOSIS — M810 Age-related osteoporosis without current pathological fracture: Secondary | ICD-10-CM | POA: Diagnosis present

## 2017-09-24 DIAGNOSIS — K529 Noninfective gastroenteritis and colitis, unspecified: Secondary | ICD-10-CM | POA: Diagnosis present

## 2017-09-24 DIAGNOSIS — R531 Weakness: Secondary | ICD-10-CM | POA: Diagnosis present

## 2017-09-24 DIAGNOSIS — I10 Essential (primary) hypertension: Secondary | ICD-10-CM | POA: Diagnosis present

## 2017-09-24 DIAGNOSIS — R197 Diarrhea, unspecified: Secondary | ICD-10-CM | POA: Diagnosis not present

## 2017-09-24 LAB — URINALYSIS, COMPLETE (UACMP) WITH MICROSCOPIC
BILIRUBIN URINE: NEGATIVE
Glucose, UA: NEGATIVE mg/dL
HGB URINE DIPSTICK: NEGATIVE
KETONES UR: NEGATIVE mg/dL
Nitrite: NEGATIVE
PROTEIN: NEGATIVE mg/dL
Specific Gravity, Urine: 1.01 (ref 1.005–1.030)
pH: 6 (ref 5.0–8.0)

## 2017-09-24 LAB — CBC WITH DIFFERENTIAL/PLATELET
BASOS PCT: 0 %
Basophils Absolute: 0 10*3/uL (ref 0–0.1)
Eosinophils Absolute: 0.1 10*3/uL (ref 0–0.7)
Eosinophils Relative: 1 %
HEMATOCRIT: 34.6 % — AB (ref 35.0–47.0)
HEMOGLOBIN: 11.1 g/dL — AB (ref 12.0–16.0)
LYMPHS ABS: 0.5 10*3/uL — AB (ref 1.0–3.6)
Lymphocytes Relative: 6 %
MCH: 28.4 pg (ref 26.0–34.0)
MCHC: 32 g/dL (ref 32.0–36.0)
MCV: 88.7 fL (ref 80.0–100.0)
MONO ABS: 0.4 10*3/uL (ref 0.2–0.9)
MONOS PCT: 4 %
NEUTROS ABS: 7.9 10*3/uL — AB (ref 1.4–6.5)
Neutrophils Relative %: 89 %
Platelets: 290 10*3/uL (ref 150–440)
RBC: 3.9 MIL/uL (ref 3.80–5.20)
RDW: 15.8 % — AB (ref 11.5–14.5)
WBC: 8.9 10*3/uL (ref 3.6–11.0)

## 2017-09-24 LAB — INFLUENZA PANEL BY PCR (TYPE A & B)
INFLAPCR: NEGATIVE
INFLBPCR: NEGATIVE

## 2017-09-24 LAB — CBC
HEMATOCRIT: 29.1 % — AB (ref 35.0–47.0)
Hemoglobin: 9.4 g/dL — ABNORMAL LOW (ref 12.0–16.0)
MCH: 28.7 pg (ref 26.0–34.0)
MCHC: 32.4 g/dL (ref 32.0–36.0)
MCV: 88.7 fL (ref 80.0–100.0)
Platelets: 250 10*3/uL (ref 150–440)
RBC: 3.29 MIL/uL — ABNORMAL LOW (ref 3.80–5.20)
RDW: 15.3 % — AB (ref 11.5–14.5)
WBC: 12.7 10*3/uL — AB (ref 3.6–11.0)

## 2017-09-24 LAB — LACTIC ACID, PLASMA
LACTIC ACID, VENOUS: 1.5 mmol/L (ref 0.5–1.9)
Lactic Acid, Venous: 2.1 mmol/L (ref 0.5–1.9)

## 2017-09-24 LAB — COMPREHENSIVE METABOLIC PANEL
ALK PHOS: 132 U/L — AB (ref 38–126)
ALT: 10 U/L — ABNORMAL LOW (ref 14–54)
ANION GAP: 11 (ref 5–15)
AST: 27 U/L (ref 15–41)
Albumin: 3.4 g/dL — ABNORMAL LOW (ref 3.5–5.0)
BUN: 30 mg/dL — ABNORMAL HIGH (ref 6–20)
CALCIUM: 8.7 mg/dL — AB (ref 8.9–10.3)
CO2: 21 mmol/L — AB (ref 22–32)
Chloride: 103 mmol/L (ref 101–111)
Creatinine, Ser: 1 mg/dL (ref 0.44–1.00)
GFR calc non Af Amer: 48 mL/min — ABNORMAL LOW (ref >60)
GFR, EST AFRICAN AMERICAN: 55 mL/min — AB (ref >60)
Glucose, Bld: 129 mg/dL — ABNORMAL HIGH (ref 65–99)
POTASSIUM: 4.7 mmol/L (ref 3.5–5.1)
SODIUM: 135 mmol/L (ref 135–145)
Total Bilirubin: 0.7 mg/dL (ref 0.3–1.2)
Total Protein: 6.8 g/dL (ref 6.5–8.1)

## 2017-09-24 LAB — CREATININE, SERUM
Creatinine, Ser: 1 mg/dL (ref 0.44–1.00)
GFR calc non Af Amer: 48 mL/min — ABNORMAL LOW (ref >60)
GFR, EST AFRICAN AMERICAN: 55 mL/min — AB (ref >60)

## 2017-09-24 LAB — PROTIME-INR
INR: 0.96
Prothrombin Time: 12.7 seconds (ref 11.4–15.2)

## 2017-09-24 MED ORDER — LORATADINE 10 MG PO TABS
10.0000 mg | ORAL_TABLET | Freq: Every day | ORAL | Status: DC
  Administered 2017-09-25 – 2017-09-27 (×3): 10 mg via ORAL
  Filled 2017-09-24 (×3): qty 1

## 2017-09-24 MED ORDER — GUAIFENESIN 100 MG/5ML PO SOLN
200.0000 mg | Freq: Three times a day (TID) | ORAL | Status: DC | PRN
  Filled 2017-09-24: qty 10

## 2017-09-24 MED ORDER — ACETAMINOPHEN 500 MG PO TABS
1000.0000 mg | ORAL_TABLET | Freq: Once | ORAL | Status: AC
  Administered 2017-09-24: 1000 mg via ORAL
  Filled 2017-09-24: qty 2

## 2017-09-24 MED ORDER — SODIUM CHLORIDE 0.9 % IV SOLN
1.0000 g | Freq: Once | INTRAVENOUS | Status: AC
  Administered 2017-09-24: 1 g via INTRAVENOUS
  Filled 2017-09-24: qty 10

## 2017-09-24 MED ORDER — CIPROFLOXACIN IN D5W 200 MG/100ML IV SOLN
200.0000 mg | INTRAVENOUS | Status: DC
  Administered 2017-09-25 – 2017-09-26 (×2): 200 mg via INTRAVENOUS
  Filled 2017-09-24 (×3): qty 100

## 2017-09-24 MED ORDER — ATORVASTATIN CALCIUM 20 MG PO TABS
80.0000 mg | ORAL_TABLET | Freq: Every day | ORAL | Status: DC
  Administered 2017-09-25 – 2017-09-26 (×2): 80 mg via ORAL
  Filled 2017-09-24 (×2): qty 4

## 2017-09-24 MED ORDER — TIOTROPIUM BROMIDE MONOHYDRATE 18 MCG IN CAPS
18.0000 ug | ORAL_CAPSULE | Freq: Every day | RESPIRATORY_TRACT | Status: DC
  Administered 2017-09-25 – 2017-09-27 (×3): 18 ug via RESPIRATORY_TRACT
  Filled 2017-09-24: qty 5

## 2017-09-24 MED ORDER — LORAZEPAM 0.5 MG PO TABS: 0.5000 mg | ORAL_TABLET | Freq: Every evening | ORAL | Status: DC | PRN

## 2017-09-24 MED ORDER — IBUPROFEN 400 MG PO TABS
200.0000 mg | ORAL_TABLET | Freq: Four times a day (QID) | ORAL | Status: DC | PRN
  Administered 2017-09-27: 200 mg via ORAL
  Filled 2017-09-24: qty 1

## 2017-09-24 MED ORDER — LEVOTHYROXINE SODIUM 50 MCG PO TABS
75.0000 ug | ORAL_TABLET | Freq: Every day | ORAL | Status: DC
  Administered 2017-09-25 – 2017-09-27 (×3): 75 ug via ORAL
  Filled 2017-09-24 (×3): qty 2

## 2017-09-24 MED ORDER — FLUTICASONE FUROATE-VILANTEROL 100-25 MCG/INH IN AEPB
1.0000 | INHALATION_SPRAY | Freq: Every day | RESPIRATORY_TRACT | Status: DC
  Administered 2017-09-25 – 2017-09-27 (×3): 1 via RESPIRATORY_TRACT
  Filled 2017-09-24 (×2): qty 28

## 2017-09-24 MED ORDER — ALBUTEROL SULFATE (2.5 MG/3ML) 0.083% IN NEBU: 2.5000 mg | INHALATION_SOLUTION | Freq: Four times a day (QID) | RESPIRATORY_TRACT | Status: DC | PRN

## 2017-09-24 MED ORDER — IOPAMIDOL (ISOVUE-300) INJECTION 61%
75.0000 mL | Freq: Once | INTRAVENOUS | Status: AC | PRN
  Administered 2017-09-24: 75 mL via INTRAVENOUS

## 2017-09-24 MED ORDER — HEPARIN SODIUM (PORCINE) 5000 UNIT/ML IJ SOLN
5000.0000 [IU] | Freq: Three times a day (TID) | INTRAMUSCULAR | Status: DC
  Administered 2017-09-25 – 2017-09-27 (×6): 5000 [IU] via SUBCUTANEOUS
  Filled 2017-09-24 (×7): qty 1

## 2017-09-24 MED ORDER — SODIUM CHLORIDE 0.9 % IV BOLUS
1000.0000 mL | Freq: Once | INTRAVENOUS | Status: AC
  Administered 2017-09-24: 1000 mL via INTRAVENOUS

## 2017-09-24 MED ORDER — METRONIDAZOLE IN NACL 5-0.79 MG/ML-% IV SOLN
500.0000 mg | Freq: Three times a day (TID) | INTRAVENOUS | Status: DC
  Administered 2017-09-25 – 2017-09-26 (×4): 500 mg via INTRAVENOUS
  Filled 2017-09-24 (×6): qty 100

## 2017-09-24 MED ORDER — AMLODIPINE BESYLATE 10 MG PO TABS: 10.0000 mg | ORAL_TABLET | Freq: Every day | ORAL | Status: DC

## 2017-09-24 MED ORDER — SERTRALINE HCL 50 MG PO TABS
100.0000 mg | ORAL_TABLET | Freq: Every day | ORAL | Status: DC
  Administered 2017-09-25 – 2017-09-27 (×3): 100 mg via ORAL
  Filled 2017-09-24 (×3): qty 2

## 2017-09-24 MED ORDER — DOCUSATE SODIUM 100 MG PO CAPS: 100.0000 mg | ORAL_CAPSULE | Freq: Two times a day (BID) | ORAL | Status: DC | PRN

## 2017-09-24 NOTE — H&P (Signed)
[facility] NAME: Chelsea [name]    MR#:  [id number]  DATE OF BIRTH:  1924-03-22  DATE OF ADMISSION:  09/24/2017  PRIMARY CARE PHYSICIAN: Mar Daring, PA-C   REQUESTING/REFERRING PHYSICIAN: Alfred Levins  CHIEF COMPLAINT:   Chief Complaint  Patient presents with  . Code Sepsis    HISTORY OF PRESENT ILLNESS: Chelsea [name]  is a 84 y.o. female with a known history of asthma, coronary artery disease, hyperkalemia, hyperlipidemia, hypertension, stroke- lives in a retirement community, came to emergency room as she had fever since yesterday. She was also feeling weak. She had urinary frequency and some diarrhea since yesterday at her facility. On CT scan abdomen she is noted to have some colitis and her UA was positive with some lactic acidosis so ER physician suggested to admit to hospitalist service for further management.  PAST MEDICAL HISTORY:   Past Medical History:  Diagnosis Date  . Asthma   . Bradycardia    hx of it and fatigue with beta blockade  . Carotid artery disease ([place])    s/p carotid endarterectomy  . Coronary artery disease    a. s/p 2 vessel CABG 1989; b. echo 2003: nl; c. cath 2006: patent grafts, EF 65%; d. nuclear stress test 2007: no ischemia, EF 81%  . Degenerative disc disease   . History of hyperkalemia   . Hyperlipidemia   . Hypertension   . Mitral valve prolapse   . Norovirus 2014  . Osteoporosis   . Spinal stenosis in cervical region   . Stroke [facility]) 1980s   left brain    PAST SURGICAL HISTORY:  Past Surgical History:  Procedure Laterality Date  . ABDOMINAL HYSTERECTOMY    . BLADDER SUSPENSION    . CAROTID ENDARTERECTOMY     left  . CHOLECYSTECTOMY    . CORONARY ARTERY BYPASS GRAFT    . HIP SURGERY  02/2011   right    SOCIAL HISTORY:  Social History   Tobacco Use  . Smoking status: Former Research scientist (life sciences)  . Smokeless tobacco: Never Used  . Tobacco comment: quit 50 years ago  Substance  Use Topics  . Alcohol use: No    FAMILY HISTORY:  Family History  Problem Relation Age of Onset  . Throat cancer Mother   . Stroke Father     DRUG ALLERGIES:  Allergies  Allergen Reactions  . Sulfa Antibiotics     Other reaction(s): Dizziness  . Sulfonamide Derivatives Other (See Comments)    Reaction:  Dizziness     REVIEW OF SYSTEMS:   CONSTITUTIONAL: she have fever, fatigue or weakness.  EYES: No blurred or double vision.  EARS, NOSE, AND THROAT: No tinnitus or ear pain.  RESPIRATORY: No cough, shortness of breath, wheezing or hemoptysis.  CARDIOVASCULAR: No chest pain, orthopnea, edema.  GASTROINTESTINAL: No nausea, vomiting, diarrhea or abdominal pain.  GENITOURINARY: No dysuria, hematuria.  ENDOCRINE: No polyuria, nocturia,  HEMATOLOGY: No anemia, easy bruising or bleeding SKIN: No rash or lesion. MUSCULOSKELETAL: No joint pain or arthritis.   NEUROLOGIC: No tingling, numbness, weakness.  PSYCHIATRY: No anxiety or depression.   MEDICATIONS AT HOME:  Prior to Admission medications   Medication Sig Start Date End Date Taking? Authorizing Provider  albuterol (PROVENTIL HFA;VENTOLIN HFA) 108 (90 Base) MCG/ACT inhaler Inhale 2 puffs into the lungs every 6 (six) hours as needed for wheezing or shortness of breath. 02/07/16  Yes Gollan, Kathlene November, MD  amLODipine (NORVASC) 10 MG tablet  take 1 tablet by mouth once daily 07/14/16  Yes Pollak, Adriana M, PA-C  atorvastatin (LIPITOR) 80 MG tablet Take 1 tablet (80 mg total) by mouth daily. 03/15/17  Yes Gollan, Kathlene November, MD  fluticasone furoate-vilanterol (BREO ELLIPTA) 100-25 MCG/INH AEPB Inhale 1 puff into the lungs daily.   Yes [provider]  guaiFENesin (ROBITUSSIN) 100 MG/5ML liquid Take 200 mg by mouth 3 (three) times daily as needed for cough.   Yes [provider]  ibuprofen (ADVIL,MOTRIN) 200 MG tablet Take 200 mg by mouth every 6 (six) hours as needed for mild pain.   Yes [provider]   levothyroxine (SYNTHROID, LEVOTHROID) 75 MCG tablet Take 75 mcg by mouth daily.   Yes [provider]  loratadine (CLARITIN) 10 MG tablet TAKE 1 TABLET BY MOUTH ONCE DAILY 08/29/17  Yes Fenton Malling M, PA-C  LORazepam (ATIVAN) 0.5 MG tablet Take 0.5 mg by mouth at bedtime as needed for anxiety.   Yes [provider]  metaxalone (SKELAXIN) 800 MG tablet Take 1 tablet (800 mg total) by mouth 3 (three) times daily as needed for muscle spasms. Patient taking differently: Take 800 mg by mouth every 6 (six) hours as needed for muscle spasms.  02/18/15  Yes Sudini, Alveta Heimlich, MD  Multiple Vitamins-Minerals (PRESERVISION AREDS PO) Take 1 capsule by mouth daily.    Yes [provider]  nitroGLYCERIN (NITROSTAT) 0.4 MG SL tablet Place 1 tablet (0.4 mg total) under the tongue every 5 (five) minutes as needed. Patient taking differently: Place 0.4 mg under the tongue every 5 (five) minutes as needed for chest pain.  02/07/16  Yes Gollan, Kathlene November, MD  RA SENNA PLUS 8.6-50 MG tablet Take 1 tablet by mouth daily. 06/14/16  Yes [provider]  sertraline (ZOLOFT) 100 MG tablet Take 1 tablet (100 mg total) by mouth daily. 11/03/16  Yes Mar Daring, PA-C  tiotropium (SPIRIVA HANDIHALER) 18 MCG inhalation capsule Place 1 capsule (18 mcg total) into inhaler and inhale daily. 03/19/15  Yes Margarita Rana, MD  benzonatate (TESSALON) 100 MG capsule Take 1 capsule (100 mg total) by mouth 2 (two) times daily as needed for cough. Patient not taking: Reported on 09/24/2017 07/03/17   Mar Daring, PA-C  lactulose (CHRONULAC) 10 GM/15ML solution Take 15 mLs (10 g total) by mouth 2 (two) times daily as needed for moderate constipation. Patient not taking: Reported on 09/24/2017 02/22/17   Mar Daring, PA-C  levofloxacin (LEVAQUIN) 250 MG tablet Take 1 tablet (250 mg total) by mouth daily. Patient not taking: Reported on 09/24/2017 07/03/17   Mar Daring, PA-C   levothyroxine (SYNTHROID, LEVOTHROID) 88 MCG tablet take 1 tablet by mouth once daily Patient not taking: Reported on 09/24/2017 03/27/17   Mar Daring, PA-C  NONFORMULARY OR COMPOUNDED ITEM [facility]  Peripheral Neuropathy Cream- Bupivacaine 1%, Doxepin 3%, Gabapentin 6%, Pentoxifylline 3%, Topiramate 1% Apply 1-2 grams to affected area 3-4 times daily Qty. 120 gm 3 refills    [provider]  triamcinolone cream (KENALOG) 0.1 % Apply 1 application topically 2 (two) times daily. Patient not taking: Reported on 09/24/2017 12/22/16   Mar Daring, PA-C      PHYSICAL EXAMINATION:   VITAL SIGNS: Blood pressure (!) 121/56, pulse 87, temperature 98.7 F (37.1 C), temperature source Oral, resp. rate 18, height 5\' 2"  (1.575 m), weight 58.5 kg (129 lb), SpO2 93 %.  GENERAL:  84 y.o.-year-old patient lying in the bed with no acute  distress.  EYES: Pupils equal, round, reactive to light and accommodation. No scleral icterus. Extraocular muscles intact.  HEENT: Head atraumatic, normocephalic. Oropharynx and nasopharynx clear.  NECK:  Supple, no jugular venous distention. No thyroid enlargement, no tenderness.  LUNGS: Normal breath sounds bilaterally, no wheezing, rales,rhonchi or crepitation. No use of accessory muscles of respiration.  CARDIOVASCULAR: S1, S2 normal. No murmurs, rubs, or gallops.  ABDOMEN: Soft, nontender, nondistended. Bowel sounds present. No organomegaly or mass.  EXTREMITIES: No pedal edema, cyanosis, or clubbing.  NEUROLOGIC: Cranial nerves II through XII are intact. Muscle strength 5/5 in all extremities. Sensation intact. Gait not checked.  PSYCHIATRIC: The patient is alert and oriented x 3.  SKIN: No obvious rash, lesion, or ulcer.   LABORATORY PANEL:   CBC Recent Labs  Lab 09/24/17 1825  WBC 8.9  HGB 11.1*  HCT 34.6*  PLT 290  MCV 88.7  MCH 28.4  MCHC 32.0  RDW 15.8*  LYMPHSABS 0.5*  MONOABS 0.4  EOSABS 0.1  BASOSABS 0.0    ------------------------------------------------------------------------------------------------------------------  Chemistries  Recent Labs  Lab 09/24/17 1825  NA 135  K 4.7  CL 103  CO2 21*  GLUCOSE 129*  BUN 30*  CREATININE 1.00  CALCIUM 8.7*  AST 27  ALT 10*  ALKPHOS 132*  BILITOT 0.7   ------------------------------------------------------------------------------------------------------------------ estimated creatinine clearance is 29 mL/min (by C-G formula based on SCr of 1 mg/dL). ------------------------------------------------------------------------------------------------------------------ No results for input(s): TSH, T4TOTAL, T3FREE, THYROIDAB in the last 72 hours.  Invalid input(s): FREET3   Coagulation profile Recent Labs  Lab 09/24/17 1825  INR 0.96   ------------------------------------------------------------------------------------------------------------------- No results for input(s): DDIMER in the last 72 hours. -------------------------------------------------------------------------------------------------------------------  Cardiac Enzymes No results for input(s): CKMB, TROPONINI, MYOGLOBIN in the last 168 hours.  Invalid input(s): CK ------------------------------------------------------------------------------------------------------------------ Invalid input(s): POCBNP  ---------------------------------------------------------------------------------------------------------------  Urinalysis    Component Value Date/Time   COLORURINE YELLOW (A) 09/24/2017 1855   APPEARANCEUR HAZY (A) 09/24/2017 1855   APPEARANCEUR Clear 06/25/2014 1534   LABSPEC 1.010 09/24/2017 1855   LABSPEC 1.024 06/25/2014 1534   PHURINE 6.0 09/24/2017 1855   GLUCOSEU NEGATIVE 09/24/2017 1855   GLUCOSEU Negative 06/25/2014 1534   HGBUR NEGATIVE 09/24/2017 1855   BILIRUBINUR NEGATIVE 09/24/2017 1855   BILIRUBINUR negative 04/06/2017 1003   BILIRUBINUR  Negative 06/25/2014 1534   KETONESUR NEGATIVE 09/24/2017 1855   PROTEINUR NEGATIVE 09/24/2017 1855   UROBILINOGEN 0.2 04/06/2017 1003   NITRITE NEGATIVE 09/24/2017 1855   LEUKOCYTESUR SMALL (A) 09/24/2017 1855   LEUKOCYTESUR Negative 06/25/2014 1534     RADIOLOGY: Dg Chest 2 View  Result Date: 09/24/2017 CLINICAL DATA:  Fever and loose stools times 24 hours.  Sepsis. EXAM: CHEST - 2 VIEW COMPARISON:  Chest CT 07/01/2014, CXR ulnar 07/03/2017 FINDINGS: Heart is top-normal in size. Status post CABG. There is moderate aortic atherosclerosis. Calcified and noncalcified hilar lymph nodes compatible with old granulomatous disease. 8 mm nodular opacity in the right upper lobe may reflect a pulmonary nodule or pleural thickening. Chronic interstitial prominence especially at the left lung base. Findings may reflect areas of chronic atelectasis and/or scarring. Chronic right-sided rib fractures without acute osseous abnormality. IMPRESSION: 1. No active cardiopulmonary disease. 2. Stigmata of old granulomatous disease including calcified and noncalcified appearing hilar lymph nodes and noncalcified appearing right upper lobe pulmonary 8 mm nodular opacity. 3. Chronic interstitial lung disease and probable fibrosis more so at the left lung base. 4. Remote right-sided rib fractures. Electronically Signed   By: Ashley Royalty M.D.   On:  09/24/2017 19:23   Ct Abdomen Pelvis W Contrast  Result Date: 09/24/2017 CLINICAL DATA:  84 year old female with loose stools for 24 hours. Not feeling well for the past 12 hours. Decreased food intake. Prior bladder suspension hysterectomy and cholecystectomy. Initial encounter. EXAM: CT ABDOMEN AND PELVIS WITH CONTRAST TECHNIQUE: Multidetector CT imaging of the abdomen and pelvis was performed using the standard protocol following bolus administration of intravenous contrast. CONTRAST:  20mL ISOVUE-300 IOPAMIDOL (ISOVUE-300) INJECTION 61% COMPARISON:  12/21/2016 CT. FINDINGS: Lower  chest: Scarring lung bases. Heart size within normal limits. Coronary artery calcifications. Hepatobiliary: Post cholecystectomy. Stable appearance of mild prominence of intra and extrahepatic biliary ducts felt to be related to patient's age and post cholecystectomy state. No calcified common bile duct stone or obstructing lesion. Pancreas: No pancreatic mass or inflammation. Spleen: No splenic mass or enlargement. Adrenals/Urinary Tract: No obstructing stone or hydronephrosis. Left kidney is slightly atrophic. No worrisome renal or adrenal mass. Noncontrast filled views of the urinary bladder without gross abnormality. Stomach/Bowel: Moderate-size hiatal hernia. Circumferential thickening mid segment transverse colon. This may be related to under distension although mild colitis is also consideration. Vascular/Lymphatic: Prominent atherosclerotic changes abdominal aorta and aortic branch vessels. No abdominal aortic aneurysm. Narrowing of portions of the celiac artery, superior mesenteric artery, renal arteries and inferior mesenteric artery without complete occlusion. Narrowing iliac arteries and femoral arteries. No adenopathy. Reproductive: Post hysterectomy.  No worrisome adnexal mass. Other: No free air or bowel containing hernia. Musculoskeletal: Scoliosis lower thoracic and lumbar spine. Chronic compression deformity of portions of T11, T12, L1, L2, L4 and L5 similar to the prior exam. Prior right hip replacement. IMPRESSION: Circumferential thickening mid segment transverse colon. This may be related to under distension although mild colitis is also consideration. Moderate-size hiatal hernia. Aortic Atherosclerosis (ICD10-I70.0). Narrowing of portions of the celiac artery, superior mesenteric artery, renal arteries, inferior mesenteric artery, iliac arteries and femoral arteries. Post cholecystectomy. Stable appearance of mild prominence of intra and extrahepatic biliary ducts felt to be related to  patient's age and post cholecystectomy state. Scoliosis with chronic compression deformities similar to prior exam. Prior right hip replacement. Electronically Signed   By: Genia Del M.D.   On: 09/24/2017 20:25    EKG: Orders placed or performed in visit on 11/03/16  . EKG 12-Lead    IMPRESSION AND PLAN:  * UTI   IV ciprofloxacin and follow urine culture.  *  Colitis   IV Cipro and Flagyl, as per her friend there are cases of diarrhea at her facility.  * hypertension   Continue home medication.  * hyperlipidemia   Continue atorvastatin.  * hypothyroidism   Continue levothyroxine.  All the records are reviewed and case discussed with ED provider. Management plans discussed with the patient, family and they are in agreement.  CODE STATUS: DNR Code Status History    Date Active Date Inactive Code Status Order ID Comments User Context   08/05/2015 1128 08/06/2015 1740 Full Code [id number]  Demetrios Loll, MD Inpatient   02/10/2015 2145 02/18/2015 1751 DNR [id number]  Gladstone Lighter, MD Inpatient       TOTAL TIME TAKING CARE OF THIS PATIENT: 50 minutes.    Vaughan Basta M.D on 09/24/2017   Between 7am to 6pm - Pager - [phone]  After 6pm go to [email or web address] - password [email or web address] [facility]  Office  [phone]  CC: Primary care physician; Mar Daring, PA-C   Note: This dictation was prepared with Dragon dictation along with  smaller phrase technology. Any transcriptional errors that result from this process are unintentional.

## 2017-09-24 NOTE — ED Notes (Signed)
All labs were sent at this time. Culturesx2 sent as well. Dejea RN aware

## 2017-09-24 NOTE — Progress Notes (Signed)
Family Meeting Note  Advance Directive:yes  Today a meeting took place with the Patient.  The following clinical team members were present during this meeting:MD  The following were discussed:Patient's diagnosis: colitis, UTI , Patient's progosis: Unable to determine and Goals for treatment: DNR  Additional follow-up to be provided: PCP  Time spent during discussion:20 minutes  Vaughan Basta, MD

## 2017-09-24 NOTE — ED Notes (Signed)
Patient transported to X-ray 

## 2017-09-24 NOTE — Progress Notes (Signed)
Patient ordered cipro IV 200 mg q12h, based on renal function patient should be on cipro 200 mg IV q18h.  Spoke to MD and agrees.  Tobie Lords, PharmD, BCPS Clinical Pharmacist 09/24/2017

## 2017-09-24 NOTE — ED Provider Notes (Signed)
[facility] Emergency Department Provider Note  ____________________________________________  Time seen: Approximately 8:46 PM  I have reviewed the triage vital signs and the nursing notes.   HISTORY  Chief Complaint Code Sepsis   HPI Chelsea [name] is a 84 y.o. female with a history of asthma, carotid artery disease, coronary artery disease, hypertension, hyperlipidemia, stroke who presents for evaluation of fever and generalized fatigue.  Patient reports that she has been feeling unwell since yesterday.  Very tired and sleeping more than normal.  Today was found to have a fever at her nursing home.  Patient reports that she has had a 7-8 bowel movements today but they are all solid. Patient reports habit of using her fingers to remove the stool from the rectum and had to do this today for all bowel movements since they were not watery. She is complaining of lower abdominal pain, mild constant and non radiation.  She endorses urinary frequency but no dysuria, no flank pain.  No URI symptoms, no cough or congestion, no nausea or vomiting, no headache or neck stiffness.   Past Medical History:  Diagnosis Date  . Asthma   . Bradycardia    hx of it and fatigue with beta blockade  . Carotid artery disease ([place])    s/p carotid endarterectomy  . Coronary artery disease    a. s/p 2 vessel CABG 1989; b. echo 2003: nl; c. cath 2006: patent grafts, EF 65%; d. nuclear stress test 2007: no ischemia, EF 81%  . Degenerative disc disease   . History of hyperkalemia   . Hyperlipidemia   . Hypertension   . Mitral valve prolapse   . Norovirus 2014  . Osteoporosis   . Spinal stenosis in cervical region   . Stroke [facility]) 1980s   left brain    Patient Active Problem List   Diagnosis Date Noted  . Slow transit constipation 02/22/2017  . Low BP 11/11/2015  . Polyarthritis 08/05/2015  . Pulmonary fibrosis ([place]) 05/11/2015  . SOB (shortness of breath)   . Weakness     . Enterostenosis ([place]) 02/09/2015  . Peripheral blood vessel disorder ([place]) 02/09/2015  . 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 OP (osteoporosis) 02/09/2015  . Billowing mitral valve 02/09/2015  . Degeneration macular 02/09/2015  . Hypercholesteremia 02/09/2015  . Adult hypothyroidism 02/09/2015  . Cannot sleep 02/09/2015  . 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 Presence of aortocoronary bypass graft 02/09/2015  . 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 Esophagitis, reflux 02/09/2015  . Diverticulitis 02/09/2015  . Colon, diverticulosis 02/09/2015  . Constipation due to opioid therapy 02/09/2015  . CAD in native artery 02/09/2015  . Allergic rhinitis 02/09/2015  . Back ache 02/09/2015  . COPD (chronic obstructive pulmonary disease) ([place]) 01/15/2015  . GERD (gastroesophageal reflux disease) 12/23/2014  . Spinal stenosis at L4-L5 level 12/16/2014  . Lumbar nerve root compression 12/16/2014  . Compression fracture of lumbar vertebra ([place]) 12/16/2014  . Lumbar canal stenosis 11/04/2013  . Neuritis or radiculitis due to rupture of lumbar intervertebral disc 11/04/2013  . DDD (degenerative disc disease), lumbar 11/04/2013  . Degeneration of intervertebral disc of lumbar region 11/04/2013  . Dermatophytosis of nail 03/31/2013  . Pain in limb 03/31/2013  . HTN (hypertension) 07/24/2011  . Hyperlipidemia 01/31/2010  . Carotid artery stenosis 12/21/2009  . CORONARY ATHEROSLERO AUTOL VEIN BYPASS GRAFT 05/25/2009  . Dyspnea on exertion 05/25/2009    Past Surgical History:  Procedure Laterality Date  . ABDOMINAL HYSTERECTOMY    .  BLADDER SUSPENSION    . CAROTID ENDARTERECTOMY     left  . CHOLECYSTECTOMY    . CORONARY ARTERY BYPASS GRAFT    . HIP SURGERY  02/2011   right    Prior to Admission medications   Medication Sig Start Date End Date Taking? Authorizing Provider  albuterol (PROVENTIL HFA;VENTOLIN HFA) 108 (90 Base) MCG/ACT inhaler Inhale 2 puffs into the lungs every 6 (six) hours  as needed for wheezing or shortness of breath. 02/07/16  Yes Gollan, Kathlene November, MD  amLODipine (NORVASC) 10 MG tablet take 1 tablet by mouth once daily 07/14/16  Yes Pollak, Adriana M, PA-C  atorvastatin (LIPITOR) 80 MG tablet Take 1 tablet (80 mg total) by mouth daily. 03/15/17  Yes Gollan, Kathlene November, MD  fluticasone furoate-vilanterol (BREO ELLIPTA) 100-25 MCG/INH AEPB Inhale 1 puff into the lungs daily.   Yes [provider]  guaiFENesin (ROBITUSSIN) 100 MG/5ML liquid Take 200 mg by mouth 3 (three) times daily as needed for cough.   Yes [provider]  ibuprofen (ADVIL,MOTRIN) 200 MG tablet Take 200 mg by mouth every 6 (six) hours as needed for mild pain.   Yes [provider]  levothyroxine (SYNTHROID, LEVOTHROID) 75 MCG tablet Take 75 mcg by mouth daily.   Yes [provider]  loratadine (CLARITIN) 10 MG tablet TAKE 1 TABLET BY MOUTH ONCE DAILY 08/29/17  Yes Fenton Malling M, PA-C  LORazepam (ATIVAN) 0.5 MG tablet Take 0.5 mg by mouth at bedtime as needed for anxiety.   Yes [provider]  metaxalone (SKELAXIN) 800 MG tablet Take 1 tablet (800 mg total) by mouth 3 (three) times daily as needed for muscle spasms. Patient taking differently: Take 800 mg by mouth every 6 (six) hours as needed for muscle spasms.  02/18/15  Yes Sudini, Alveta Heimlich, MD  Multiple Vitamins-Minerals (PRESERVISION AREDS PO) Take 1 capsule by mouth daily.    Yes [provider]  nitroGLYCERIN (NITROSTAT) 0.4 MG SL tablet Place 1 tablet (0.4 mg total) under the tongue every 5 (five) minutes as needed. Patient taking differently: Place 0.4 mg under the tongue every 5 (five) minutes as needed for chest pain.  02/07/16  Yes Gollan, Kathlene November, MD  RA SENNA PLUS 8.6-50 MG tablet Take 1 tablet by mouth daily. 06/14/16  Yes [provider]  sertraline (ZOLOFT) 100 MG tablet Take 1 tablet (100 mg total) by mouth daily. 11/03/16  Yes Mar Daring, PA-C  tiotropium  (SPIRIVA HANDIHALER) 18 MCG inhalation capsule Place 1 capsule (18 mcg total) into inhaler and inhale daily. 03/19/15  Yes Margarita Rana, MD  benzonatate (TESSALON) 100 MG capsule Take 1 capsule (100 mg total) by mouth 2 (two) times daily as needed for cough. Patient not taking: Reported on 09/24/2017 07/03/17   Mar Daring, PA-C  lactulose (CHRONULAC) 10 GM/15ML solution Take 15 mLs (10 g total) by mouth 2 (two) times daily as needed for moderate constipation. Patient not taking: Reported on 09/24/2017 02/22/17   Mar Daring, PA-C  levofloxacin (LEVAQUIN) 250 MG tablet Take 1 tablet (250 mg total) by mouth daily. Patient not taking: Reported on 09/24/2017 07/03/17   Mar Daring, PA-C  levothyroxine (SYNTHROID, LEVOTHROID) 88 MCG tablet take 1 tablet by mouth once daily Patient not taking: Reported on 09/24/2017 03/27/17   Mar Daring, PA-C  NONFORMULARY OR COMPOUNDED ITEM [facility]  Peripheral Neuropathy Cream- Bupivacaine 1%, Doxepin 3%, Gabapentin 6%, Pentoxifylline 3%, Topiramate 1% Apply 1-2 grams to affected area 3-4  times daily Qty. 120 gm 3 refills    [provider]  triamcinolone cream (KENALOG) 0.1 % Apply 1 application topically 2 (two) times daily. Patient not taking: Reported on 09/24/2017 12/22/16   Mar Daring, PA-C    Allergies Sulfa antibiotics and Sulfonamide derivatives  Family History  Problem Relation Age of Onset  . Throat cancer Mother   . Stroke Father     Social History Social History   Tobacco Use  . Smoking status: Former Research scientist (life sciences)  . Smokeless tobacco: Never Used  . Tobacco comment: quit 50 years ago  Substance Use Topics  . Alcohol use: No  . Drug use: No    Review of Systems  Constitutional: + fever, fatigue Eyes: Negative for visual changes. ENT: Negative for sore throat. Neck: No neck pain  Cardiovascular: Negative for chest pain. Respiratory: Negative for shortness of breath. Gastrointestinal:  Negative for abdominal pain, vomiting or diarrhea. + increase BM Genitourinary: Negative for dysuria. + Urinary frequency Musculoskeletal: Negative for back pain. Skin: Negative for rash. Neurological: Negative for headaches, weakness or numbness. Psych: No SI or HI  ____________________________________________   PHYSICAL EXAM:  VITAL SIGNS: ED Triage Vitals  Enc Vitals Group     BP 09/24/17 1839 (!) 166/68     Pulse Rate 09/24/17 1839 97     Resp 09/24/17 2008 18     Temp 09/24/17 1839 (!) 101.8 F (38.8 C)     Temp Source 09/24/17 1839 Oral     SpO2 09/24/17 1839 97 %     Weight 09/24/17 1840 129 lb (58.5 kg)     Height 09/24/17 1840 5\' 2"  (1.575 m)     Head Circumference --      Peak Flow --      Pain Score 09/24/17 1840 0     Pain Loc --      Pain Edu? --      Excl. in [place]? --     Constitutional: Alert and oriented. Well appearing and in no apparent distress. HEENT:      Head: Normocephalic and atraumatic.         Eyes: Conjunctivae are normal. Sclera is non-icteric.       Mouth/Throat: Mucous membranes are moist.       Neck: Supple with no signs of meningismus. Cardiovascular: Regular rate and rhythm. No murmurs, gallops, or rubs. 2+ symmetrical distal pulses are present in all extremities. No JVD. Respiratory: Normal respiratory effort. Lungs are clear to auscultation bilaterally. No wheezes, crackles, or rhonchi.  Gastrointestinal: Soft, tenderness to palpation on the right lower quadrant, and non distended with positive bowel sounds. No rebound or guarding. Musculoskeletal: Nontender with normal range of motion in all extremities. No edema, cyanosis, or erythema of extremities. Neurologic: Normal speech and language. Face is symmetric. Moving all extremities. No gross focal neurologic deficits are appreciated. Skin: Skin is warm, dry and intact. No rash noted. Psychiatric: Mood and affect are normal. Speech and behavior are  normal.  ____________________________________________   LABS (all labs ordered are listed, but only abnormal results are displayed)  Labs Reviewed  COMPREHENSIVE METABOLIC PANEL - Abnormal; Notable for the following components:      Result Value   CO2 21 (*)    Glucose, Bld 129 (*)    BUN 30 (*)    Calcium 8.7 (*)    Albumin 3.4 (*)    ALT 10 (*)    Alkaline Phosphatase 132 (*)    GFR calc non Af  Amer 48 (*)    GFR calc Af Amer 55 (*)    All other components within normal limits  LACTIC ACID, PLASMA - Abnormal; Notable for the following components:   Lactic Acid, Venous 2.1 (*)    All other components within normal limits  CBC WITH DIFFERENTIAL/PLATELET - Abnormal; Notable for the following components:   Hemoglobin 11.1 (*)    HCT 34.6 (*)    RDW 15.8 (*)    Neutro Abs 7.9 (*)    Lymphs Abs 0.5 (*)    All other components within normal limits  URINALYSIS, COMPLETE (UACMP) WITH MICROSCOPIC - Abnormal; Notable for the following components:   Color, Urine YELLOW (*)    APPearance HAZY (*)    Leukocytes, UA SMALL (*)    Bacteria, UA RARE (*)    Squamous Epithelial / LPF 0-5 (*)    Non Squamous Epithelial 0-5 (*)    All other components within normal limits  CULTURE, BLOOD (ROUTINE X 2)  CULTURE, BLOOD (ROUTINE X 2)  URINE CULTURE  PROTIME-INR  INFLUENZA PANEL BY PCR (TYPE A & B)  LACTIC ACID, PLASMA   ____________________________________________  EKG  none ____________________________________________  RADIOLOGY  I have personally reviewed the images performed during this visit and I agree with the Radiologist's read.   Interpretation by Radiologist:  Dg Chest 2 View  Result Date: 09/24/2017 CLINICAL DATA:  Fever and loose stools times 24 hours.  Sepsis. EXAM: CHEST - 2 VIEW COMPARISON:  Chest CT 07/01/2014, CXR ulnar 07/03/2017 FINDINGS: Heart is top-normal in size. Status post CABG. There is moderate aortic atherosclerosis. Calcified and noncalcified hilar  lymph nodes compatible with old granulomatous disease. 8 mm nodular opacity in the right upper lobe may reflect a pulmonary nodule or pleural thickening. Chronic interstitial prominence especially at the left lung base. Findings may reflect areas of chronic atelectasis and/or scarring. Chronic right-sided rib fractures without acute osseous abnormality. IMPRESSION: 1. No active cardiopulmonary disease. 2. Stigmata of old granulomatous disease including calcified and noncalcified appearing hilar lymph nodes and noncalcified appearing right upper lobe pulmonary 8 mm nodular opacity. 3. Chronic interstitial lung disease and probable fibrosis more so at the left lung base. 4. Remote right-sided rib fractures. Electronically Signed   By: Ashley Royalty M.D.   On: 09/24/2017 19:23   Ct Abdomen Pelvis W Contrast  Result Date: 09/24/2017 CLINICAL DATA:  84 year old female with loose stools for 24 hours. Not feeling well for the past 12 hours. Decreased food intake. Prior bladder suspension hysterectomy and cholecystectomy. Initial encounter. EXAM: CT ABDOMEN AND PELVIS WITH CONTRAST TECHNIQUE: Multidetector CT imaging of the abdomen and pelvis was performed using the standard protocol following bolus administration of intravenous contrast. CONTRAST:  35mL ISOVUE-300 IOPAMIDOL (ISOVUE-300) INJECTION 61% COMPARISON:  12/21/2016 CT. FINDINGS: Lower chest: Scarring lung bases. Heart size within normal limits. Coronary artery calcifications. Hepatobiliary: Post cholecystectomy. Stable appearance of mild prominence of intra and extrahepatic biliary ducts felt to be related to patient's age and post cholecystectomy state. No calcified common bile duct stone or obstructing lesion. Pancreas: No pancreatic mass or inflammation. Spleen: No splenic mass or enlargement. Adrenals/Urinary Tract: No obstructing stone or hydronephrosis. Left kidney is slightly atrophic. No worrisome renal or adrenal mass. Noncontrast filled views of the  urinary bladder without gross abnormality. Stomach/Bowel: Moderate-size hiatal hernia. Circumferential thickening mid segment transverse colon. This may be related to under distension although mild colitis is also consideration. Vascular/Lymphatic: Prominent atherosclerotic changes abdominal aorta and aortic branch vessels. No abdominal  aortic aneurysm. Narrowing of portions of the celiac artery, superior mesenteric artery, renal arteries and inferior mesenteric artery without complete occlusion. Narrowing iliac arteries and femoral arteries. No adenopathy. Reproductive: Post hysterectomy.  No worrisome adnexal mass. Other: No free air or bowel containing hernia. Musculoskeletal: Scoliosis lower thoracic and lumbar spine. Chronic compression deformity of portions of T11, T12, L1, L2, L4 and L5 similar to the prior exam. Prior right hip replacement. IMPRESSION: Circumferential thickening mid segment transverse colon. This may be related to under distension although mild colitis is also consideration. Moderate-size hiatal hernia. Aortic Atherosclerosis (ICD10-I70.0). Narrowing of portions of the celiac artery, superior mesenteric artery, renal arteries, inferior mesenteric artery, iliac arteries and femoral arteries. Post cholecystectomy. Stable appearance of mild prominence of intra and extrahepatic biliary ducts felt to be related to patient's age and post cholecystectomy state. Scoliosis with chronic compression deformities similar to prior exam. Prior right hip replacement. Electronically Signed   By: Genia Del M.D.   On: 09/24/2017 20:25     ____________________________________________   PROCEDURES  Procedure(s) performed: None Procedures Critical Care performed: yes  CRITICAL CARE Performed by: Rudene Re  ?  Total critical care time: 35 min  Critical care time was exclusive of separately billable procedures and treating other patients.  Critical care was necessary to treat or  prevent imminent or life-threatening deterioration.  Critical care was time spent personally by me on the following activities: development of treatment plan with patient and/or surrogate as well as nursing, discussions with consultants, evaluation of patient's response to treatment, examination of patient, obtaining history from patient or surrogate, ordering and performing treatments and interventions, ordering and review of laboratory studies, ordering and review of radiographic studies, pulse oximetry and re-evaluation of patient's condition.  ____________________________________________   INITIAL IMPRESSION / ASSESSMENT AND PLAN / ED COURSE   84 y.o. female with a history of asthma, carotid artery disease, coronary artery disease, hypertension, hyperlipidemia, stroke who presents for evaluation of fever, generalized fatigue, urinary frequency and increased number of bowel movements since yesterday.  Patient is well-appearing, has a fever of 101.53F, her abdomen is tender to palpation on the right lower quadrant with no rebound or guarding.  Remainder of her physical exam is within normal limits.  Workup showing UA positive for UTI and a CT scan concerning for mild colitis vs underdistention, lactic acid 2.1, normal WBC. Patient given rocephin. C. Diff not sent since stool is formed. Patient given IVF and tylenol. Will admit to Hospitalist      As part of my medical decision making, I reviewed the following data within the [facility] notes reviewed and incorporated, Labs reviewed , Radiograph reviewed , Discussed with admitting physician , Notes from prior ED visits and [place] Controlled Substance Database    Pertinent labs & imaging results that were available during my care of the patient were reviewed by me and considered in my medical decision making (see chart for details).    ____________________________________________   FINAL CLINICAL IMPRESSION(S) / ED  DIAGNOSES  Final diagnoses:  Sepsis, due to unspecified organism [facility])  Acute cystitis with hematuria  Colitis      NEW MEDICATIONS STARTED DURING THIS VISIT:  ED Discharge Orders    None       Note:  This document was prepared using Dragon voice recognition software and may include unintentional dictation errors.    Rudene Re, MD 09/24/17 [phone]

## 2017-09-24 NOTE — ED Notes (Signed)
Family at bedside. 

## 2017-09-24 NOTE — ED Triage Notes (Signed)
[place] [facility] [place] and oriented. Loose stools x 24 hrs decrease feeling good x12 hours. Pt has had decreased food intake and tremors that are baseline.

## 2017-09-24 NOTE — ED Notes (Signed)
Test: lactic Critical Value: 2.1  Name of Provider Notified: Alfred Levins

## 2017-09-24 NOTE — ED Notes (Signed)
Patient transported to 202 

## 2017-09-24 NOTE — ED Notes (Signed)
Pt assist to toilet in room without complication by this RN

## 2017-09-24 NOTE — ED Notes (Signed)
Pt assisted to toilet 

## 2017-09-24 NOTE — ED Notes (Signed)
Admitting MD at bedside.

## 2017-09-24 NOTE — ED Notes (Signed)
Report to Allison, RN

## 2017-09-25 LAB — BASIC METABOLIC PANEL
ANION GAP: 7 (ref 5–15)
BUN: 28 mg/dL — AB (ref 6–20)
CALCIUM: 7.7 mg/dL — AB (ref 8.9–10.3)
CO2: 21 mmol/L — AB (ref 22–32)
CREATININE: 0.93 mg/dL (ref 0.44–1.00)
Chloride: 105 mmol/L (ref 101–111)
GFR calc Af Amer: >60 mL/min (ref >60)
GFR, EST NON AFRICAN AMERICAN: 52 mL/min — AB (ref >60)
GLUCOSE: 123 mg/dL — AB (ref 65–99)
Potassium: 4 mmol/L (ref 3.5–5.1)
Sodium: 133 mmol/L — ABNORMAL LOW (ref 135–145)

## 2017-09-25 LAB — CBC
HCT: 28 % — ABNORMAL LOW (ref 35.0–47.0)
HEMOGLOBIN: 9 g/dL — AB (ref 12.0–16.0)
MCH: 28.9 pg (ref 26.0–34.0)
MCHC: 32.1 g/dL (ref 32.0–36.0)
MCV: 90 fL (ref 80.0–100.0)
PLATELETS: 246 10*3/uL (ref 150–440)
RBC: 3.11 MIL/uL — ABNORMAL LOW (ref 3.80–5.20)
RDW: 15.4 % — AB (ref 11.5–14.5)
WBC: 13.3 10*3/uL — ABNORMAL HIGH (ref 3.6–11.0)

## 2017-09-25 MED ORDER — SODIUM CHLORIDE 0.9% FLUSH: 10.0000 mL | INTRAVENOUS | Status: DC | PRN

## 2017-09-25 MED ORDER — SODIUM CHLORIDE 0.9% FLUSH
10.0000 mL | Freq: Two times a day (BID) | INTRAVENOUS | Status: DC
  Administered 2017-09-25 – 2017-09-26 (×2): 10 mL

## 2017-09-25 NOTE — Progress Notes (Addendum)
[facility] [place] NAME: Chelsea [name]    MR#:  [id number]  DATE OF BIRTH:  11/23/1923  SUBJECTIVE:  CHIEF COMPLAINT:   Chief Complaint  Patient presents with  . Code Sepsis   The patient feels better, mild abdominal pain but no nausea vomiting or diarrhea this morning. REVIEW OF SYSTEMS:  Review of Systems  Constitutional: Positive for malaise/fatigue. Negative for chills and fever.  HENT: Negative for sore throat.   Eyes: Negative for blurred vision and double vision.  Respiratory: Negative for cough, hemoptysis, shortness of breath, wheezing and stridor.   Cardiovascular: Negative for chest pain, palpitations, orthopnea and leg swelling.  Gastrointestinal: Positive for abdominal pain. Negative for blood in stool, diarrhea, melena, nausea and vomiting.  Genitourinary: Negative for dysuria, flank pain and hematuria.  Musculoskeletal: Negative for back pain and joint pain.  Skin: Negative for rash.  Neurological: Negative for dizziness, sensory change, focal weakness, seizures, loss of consciousness, weakness and headaches.  Endo/Heme/Allergies: Negative for polydipsia.  Psychiatric/Behavioral: Negative for depression. The patient is not nervous/anxious.     DRUG ALLERGIES:   Allergies  Allergen Reactions  . Sulfa Antibiotics     Other reaction(s): Dizziness  . Sulfonamide Derivatives Other (See Comments)    Reaction:  Dizziness    VITALS:  Blood pressure (!) 140/50, pulse 74, temperature 98.1 F (36.7 C), temperature source Oral, resp. rate (!) 22, height 5\' 2"  (1.575 m), weight 129 lb 13.6 oz (58.9 kg), SpO2 93 %. PHYSICAL EXAMINATION:  Physical Exam  Constitutional: She is oriented to person, place, and time. She appears well-developed.  HENT:  Head: Normocephalic.  Mouth/Throat: Oropharynx is clear and moist.  Eyes: Pupils are equal, round, and reactive to light. Conjunctivae and EOM are normal. No scleral icterus.    Neck: Normal range of motion. Neck supple. No JVD present. No tracheal deviation present.  Cardiovascular: Normal rate, regular rhythm and normal heart sounds. Exam reveals no gallop.  No murmur heard. Pulmonary/Chest: Effort normal and breath sounds normal. No respiratory distress. She has no wheezes. She has no rales.  Abdominal: Soft. Bowel sounds are normal. She exhibits no distension. There is tenderness. There is no rebound.  Musculoskeletal: Normal range of motion. She exhibits no edema or tenderness.  Neurological: She is alert and oriented to person, place, and time. No cranial nerve deficit.  Skin: No rash noted. No erythema.   LABORATORY PANEL:  Female CBC Recent Labs  Lab 09/25/17 0328  WBC 13.3*  HGB 9.0*  HCT 28.0*  PLT 246   ------------------------------------------------------------------------------------------------------------------ Chemistries  Recent Labs  Lab 09/24/17 1825  09/25/17 0328  NA 135  --  133*  K 4.7  --  4.0  CL 103  --  105  CO2 21*  --  21*  GLUCOSE 129*  --  123*  BUN 30*  --  28*  CREATININE 1.00   < > 0.93  CALCIUM 8.7*  --  7.7*  AST 27  --   --   ALT 10*  --   --   ALKPHOS 132*  --   --   BILITOT 0.7  --   --    < > = values in this interval not displayed.   RADIOLOGY:  Dg Chest 2 View  Result Date: 09/24/2017 CLINICAL DATA:  Fever and loose stools times 24 hours.  Sepsis. EXAM: CHEST - 2 VIEW COMPARISON:  Chest CT 07/01/2014, CXR ulnar 07/03/2017 FINDINGS: Heart is top-normal  in size. Status post CABG. There is moderate aortic atherosclerosis. Calcified and noncalcified hilar lymph nodes compatible with old granulomatous disease. 8 mm nodular opacity in the right upper lobe may reflect a pulmonary nodule or pleural thickening. Chronic interstitial prominence especially at the left lung base. Findings may reflect areas of chronic atelectasis and/or scarring. Chronic right-sided rib fractures without acute osseous abnormality.  IMPRESSION: 1. No active cardiopulmonary disease. 2. Stigmata of old granulomatous disease including calcified and noncalcified appearing hilar lymph nodes and noncalcified appearing right upper lobe pulmonary 8 mm nodular opacity. 3. Chronic interstitial lung disease and probable fibrosis more so at the left lung base. 4. Remote right-sided rib fractures. Electronically Signed   By: Ashley Royalty M.D.   On: 09/24/2017 19:23   Ct Abdomen Pelvis W Contrast  Result Date: 09/24/2017 CLINICAL DATA:  84 year old female with loose stools for 24 hours. Not feeling well for the past 12 hours. Decreased food intake. Prior bladder suspension hysterectomy and cholecystectomy. Initial encounter. EXAM: CT ABDOMEN AND PELVIS WITH CONTRAST TECHNIQUE: Multidetector CT imaging of the abdomen and pelvis was performed using the standard protocol following bolus administration of intravenous contrast. CONTRAST:  92mL ISOVUE-300 IOPAMIDOL (ISOVUE-300) INJECTION 61% COMPARISON:  12/21/2016 CT. FINDINGS: Lower chest: Scarring lung bases. Heart size within normal limits. Coronary artery calcifications. Hepatobiliary: Post cholecystectomy. Stable appearance of mild prominence of intra and extrahepatic biliary ducts felt to be related to patient's age and post cholecystectomy state. No calcified common bile duct stone or obstructing lesion. Pancreas: No pancreatic mass or inflammation. Spleen: No splenic mass or enlargement. Adrenals/Urinary Tract: No obstructing stone or hydronephrosis. Left kidney is slightly atrophic. No worrisome renal or adrenal mass. Noncontrast filled views of the urinary bladder without gross abnormality. Stomach/Bowel: Moderate-size hiatal hernia. Circumferential thickening mid segment transverse colon. This may be related to under distension although mild colitis is also consideration. Vascular/Lymphatic: Prominent atherosclerotic changes abdominal aorta and aortic branch vessels. No abdominal aortic aneurysm.  Narrowing of portions of the celiac artery, superior mesenteric artery, renal arteries and inferior mesenteric artery without complete occlusion. Narrowing iliac arteries and femoral arteries. No adenopathy. Reproductive: Post hysterectomy.  No worrisome adnexal mass. Other: No free air or bowel containing hernia. Musculoskeletal: Scoliosis lower thoracic and lumbar spine. Chronic compression deformity of portions of T11, T12, L1, L2, L4 and L5 similar to the prior exam. Prior right hip replacement. IMPRESSION: Circumferential thickening mid segment transverse colon. This may be related to under distension although mild colitis is also consideration. Moderate-size hiatal hernia. Aortic Atherosclerosis (ICD10-I70.0). Narrowing of portions of the celiac artery, superior mesenteric artery, renal arteries, inferior mesenteric artery, iliac arteries and femoral arteries. Post cholecystectomy. Stable appearance of mild prominence of intra and extrahepatic biliary ducts felt to be related to patient's age and post cholecystectomy state. Scoliosis with chronic compression deformities similar to prior exam. Prior right hip replacement. Electronically Signed   By: Genia Del M.D.   On: 09/24/2017 20:25   ASSESSMENT AND PLAN:   * UTI   Continue IV ciprofloxacin and follow urine culture.  *  Colitis Continue  IV Cipro and Flagyl.  Sepsis on admission, resolved.  * hypertension   Continue home medication.  Hold if blood pressure is low.  * hyperlipidemia   Continue atorvastatin.  * hypothyroidism   Continue levothyroxine.  Generalized weakness.  PT evaluation suggest home health with PT. All the records are reviewed and case discussed with Care Management/Social Worker. Management plans discussed with the patient, family and they  are in agreement.  CODE STATUS: DNR  TOTAL TIME TAKING CARE OF THIS PATIENT: 32 minutes.   More than 50% of the time was spent in counseling/coordination of care:  YES  POSSIBLE D/C IN 2  DAYS, DEPENDING ON CLINICAL CONDITION.   Demetrios Loll M.D on 09/25/2017 at 3:24 PM  Between 7am to 6pm - Pager - [phone]  After 6pm go to [email or web address] - [facility]

## 2017-09-25 NOTE — Evaluation (Signed)
Physical Therapy Evaluation Patient Details Name: Chelsea [name] MRN: [id number] DOB: 1924/06/02 Today's Date: 09/25/2017   History of Present Illness  Patient is a 84 year old female admitted for sepsis s/p c/o fever and weakness.  PMH includes stroke, cervical spinal stenosis, osteoporosis, Htn, HLD, bradycardia, CAD, asthma, hyperkalemia and mitral valve proloapse.  Clinical Impression  Pt is a 84 year old female who lives in [place] [facility] alone.  She is independent with use of RW for mobility at baseline.  Pt is in bed upon PT arrival and able to perform bed mobility mod I with HOB slightly elevated.  Pt presents with a kyphotic posture and overall weakness of UE and LE.  She is able to perform a STS without physical assist but appears unsteady on feet.  Pt is able to ambulate 70 Ft in room with RW with mild gait deviations indicative of fall risk.  PT educated pt concerning safe placement of RW during ambulation and importance of proper walker height..  Pt presented with signs of being SOB following ambulation and reports that this has been her baseline for 3 years. Pt will continue to benefit from skilled PT with focus on strength, functional mobility, balance and safe use of RW.    Follow Up Recommendations Home health PT    Equipment Recommendations  Rolling walker with 5" wheels(Pt would benefit from a youth walker if she qualifies and does not already have one.)    Recommendations for Other Services       Precautions / Restrictions Precautions Precautions: Fall Restrictions Weight Bearing Restrictions: No      Mobility  Bed Mobility Overal bed mobility: Modified Independent             General bed mobility comments: Requires increased time and bedrail with HOB elevated to 20 deg due to fatigue but able to perform without physical assist.  Transfers Overall transfer level: Needs assistance Equipment used: Rolling walker (2 wheeled) Transfers: Sit  to/from Stand Sit to Stand: Supervision         General transfer comment: Pt able to stand without physical assist but appears weak and slighlty unsteady on feet.  She is able to use RW with proper hand placement and reports no dizziness upon standing.  Ambulation/Gait Ambulation/Gait assistance: Supervision Ambulation Distance (Feet): 50 Feet Assistive device: Rolling walker (2 wheeled)     Gait velocity interpretation: Below normal speed for age/gender General Gait Details: Able to ambulate with RW with low to moderate foot clearance, exaggerated flexed kyphotic posture and tendency to push RW in front of her.  PT educated pt concerning safe placement of RW and also stated that pt's RW should be lower if possible.  RW set at lowest position.  Pt appears SOB following ambulation and pt reports that she experiences this with all mobility for the past 3 years.  Stairs            Wheelchair Mobility    Modified Rankin (Stroke Patients Only)       Balance Overall balance assessment: Needs assistance Sitting-balance support: Bilateral upper extremity supported;Feet supported       Standing balance support: Bilateral upper extremity supported   Standing balance comment: Pt appears unsteady on feet but presented with no LOB's.                             Pertinent Vitals/Pain Pain Assessment: Faces Faces Pain Scale: Hurts a  little bit Pain Location: Neck/shoulder area with movement Pain Intervention(s): Limited activity within patient's tolerance    Home Living Family/patient expects to be discharged to:: Private residence Living Arrangements: Alone Available Help at Discharge: Neighbor Type of Home: House Home Access: Level entry     Home Layout: One level Home Equipment: Tub bench;Walker - 2 wheels;Walker - 4 wheels      Prior Function Level of Independence: Independent with assistive device(s)         Comments: Uses RW for distance mobility.   Does have an assistant who comes 2x month to help with household chores.  Pt drives to grocery store and does grocery shopping.     Hand Dominance        Extremity/Trunk Assessment   Upper Extremity Assessment Upper Extremity Assessment: Generalized weakness    Lower Extremity Assessment Lower Extremity Assessment: Generalized weakness(BLE sensation intact.  Pt reports N/T in feet due to neuropathy but that she can feel pressure.)    Cervical / Trunk Assessment Cervical / Trunk Assessment: Kyphotic  Communication   Communication: HOH  Cognition Arousal/Alertness: Awake/alert Behavior During Therapy: WFL for tasks assessed/performed Overall Cognitive Status: Within Functional Limits for tasks assessed                                        General Comments      Exercises     Assessment/Plan    PT Assessment Patient needs continued PT services  PT Problem List Decreased strength;Decreased mobility;Cardiopulmonary status limiting activity;Decreased balance;Decreased knowledge of use of DME;Decreased activity tolerance       PT Treatment Interventions DME instruction;Therapeutic activities;Gait training;Therapeutic exercise;Patient/family education;Stair training;Balance training;Functional mobility training;Neuromuscular re-education    PT Goals (Current goals can be found in the Care Plan section)  Acute Rehab PT Goals Patient Stated Goal: To return home and resume generally active lifestyle. PT Goal Formulation: With patient Time For Goal Achievement: 10/09/17 Potential to Achieve Goals: Good    Frequency Min 2X/week   Barriers to discharge        Co-evaluation               AM-PAC PT "6 Clicks" Daily Activity  Outcome Measure Difficulty turning over in bed (including adjusting bedclothes, sheets and blankets)?: A Little Difficulty moving from lying on back to sitting on the side of the bed? : A Little Difficulty sitting down on and  standing up from a chair with arms (e.g., wheelchair, bedside commode, etc,.)?: A Little Help needed moving to and from a bed to chair (including a wheelchair)?: A Little Help needed walking in hospital room?: A Little Help needed climbing 3-5 steps with a railing? : A Little 6 Click Score: 18    End of Session Equipment Utilized During Treatment: Gait belt Activity Tolerance: Patient tolerated treatment well;Patient limited by fatigue Patient left: in bed;with bed alarm set Nurse Communication: Mobility status PT Visit Diagnosis: Unsteadiness on feet (R26.81);Muscle weakness (generalized) (M62.81)    Time: 5361-4431 PT Time Calculation (min) (ACUTE ONLY): 35 min   Charges:   PT Evaluation $PT Eval Low Complexity: 1 Low PT Treatments $Gait Training: 8-22 mins   PT G Codes:   PT G-Codes **NOT FOR INPATIENT CLASS** Functional Assessment Tool Used: AM-PAC 6 Clicks Basic Mobility    Roxanne Gates, PT, DPT   Roxanne Gates 09/25/2017, 1:57 PM

## 2017-09-26 MED ORDER — CIPROFLOXACIN IN D5W 400 MG/200ML IV SOLN
400.0000 mg | Freq: Two times a day (BID) | INTRAVENOUS | Status: DC
  Administered 2017-09-26 – 2017-09-27 (×2): 400 mg via INTRAVENOUS
  Filled 2017-09-26 (×3): qty 200

## 2017-09-26 MED ORDER — METRONIDAZOLE 500 MG PO TABS
500.0000 mg | ORAL_TABLET | Freq: Three times a day (TID) | ORAL | Status: DC
  Administered 2017-09-26 – 2017-09-27 (×3): 500 mg via ORAL
  Filled 2017-09-26 (×5): qty 1

## 2017-09-26 NOTE — Progress Notes (Signed)
Pharmacy Antibiotic Note  Chelsea [name] is a 84 y.o. female admitted on 09/24/2017 with IAI.  Pharmacy has been consulted for ciprofloxacin dosing.  Plan: Ciprofloxacin 400 mg IV Q12H  Height: 5\' 2"  (157.5 cm) Weight: 129 lb 13.6 oz (58.9 kg) IBW/kg (Calculated) : 50.1  Temp (24hrs), Avg:98.4 F (36.9 C), Min:98.1 F (36.7 C), Max:98.7 F (37.1 C)  Recent Labs  Lab 09/24/17 1825 09/24/17 1842 09/24/17 2320 09/25/17 0328  WBC 8.9  --  12.7* 13.3*  CREATININE 1.00  --  1.00 0.93  LATICACIDVEN  --  2.1* 1.5  --     Estimated Creatinine Clearance: 31.2 mL/min (by C-G formula based on SCr of 0.93 mg/dL).    Allergies  Allergen Reactions  . Sulfa Antibiotics     Other reaction(s): Dizziness  . Sulfonamide Derivatives Other (See Comments)    Reaction:  Dizziness     Antimicrobials this admission:   Dose adjustments this admission: Creatinine clearance > 30 mL/min - ciprofloxacin IV does not need to be dosed for this indication and level of renal function.   Microbiology results: 4/8 BCx: NGTD 4/8 UCx: 30 K E Coli  08/05/15 MRSA PCR: Negative  Thank you for allowing pharmacy to be a part of this patient's care.  Laural Benes, Pharm.D., BCPS Clinical Pharmacist 09/26/2017 3:26 PM

## 2017-09-26 NOTE — Progress Notes (Signed)
[facility] at [place] NAME: Chelsea [name]    MR#:  [id number]  DATE OF BIRTH:  [birth year]  SUBJECTIVE:  CHIEF COMPLAINT:   Chief Complaint  Patient presents with  . Code Sepsis   The patient feels better, denies abdominal pain , no nausea vomiting or diarrhea this morning.  Reporting generalized weakness REVIEW OF SYSTEMS:  Review of Systems  Constitutional: Positive for malaise/fatigue. Negative for chills and fever.  HENT: Negative for sore throat.   Eyes: Negative for blurred vision and double vision.  Respiratory: Negative for cough, hemoptysis, shortness of breath, wheezing and stridor.   Cardiovascular: Negative for chest pain, palpitations, orthopnea and leg swelling.  Gastrointestinal: Negative for abdominal pain, blood in stool, diarrhea, melena, nausea and vomiting.  Genitourinary: Negative for dysuria, flank pain and hematuria.  Musculoskeletal: Negative for back pain and joint pain.  Skin: Negative for rash.  Neurological: Negative for dizziness, sensory change, focal weakness, seizures, loss of consciousness, weakness and headaches.  Endo/Heme/Allergies: Negative for polydipsia.  Psychiatric/Behavioral: Negative for depression. The patient is not nervous/anxious.     DRUG ALLERGIES:   Allergies  Allergen Reactions  . Sulfa Antibiotics     Other reaction(s): Dizziness  . Sulfonamide Derivatives Other (See Comments)    Reaction:  Dizziness    VITALS:  Blood pressure 127/65, pulse 70, temperature 98.1 F (36.7 C), temperature source Oral, resp. rate 20, height 5\' 2"  (1.575 m), weight 58.9 kg (129 lb 13.6 oz), SpO2 95 %. PHYSICAL EXAMINATION:  Physical Exam  Constitutional: She is oriented to person, place, and time. She appears well-developed.  HENT:  Head: Normocephalic.  Mouth/Throat: Oropharynx is clear and moist.  Eyes: Pupils are equal, round, and reactive to light. Conjunctivae and EOM are normal. No  scleral icterus.  Neck: Normal range of motion. Neck supple. No JVD present. No tracheal deviation present.  Cardiovascular: Normal rate, regular rhythm and normal heart sounds. Exam reveals no gallop.  No murmur heard. Pulmonary/Chest: Effort normal and breath sounds normal. No respiratory distress. She has no wheezes. She has no rales.  Abdominal: Soft. Bowel sounds are normal. She exhibits no distension. There is tenderness. There is no rebound.  Musculoskeletal: Normal range of motion. She exhibits no edema or tenderness.  Neurological: She is alert and oriented to person, place, and time. No cranial nerve deficit.  Skin: No rash noted. No erythema.   LABORATORY PANEL:  Female CBC Recent Labs  Lab 09/25/17 0328  WBC 13.3*  HGB 9.0*  HCT 28.0*  PLT 246   ------------------------------------------------------------------------------------------------------------------ Chemistries  Recent Labs  Lab 09/24/17 1825  09/25/17 0328  NA 135  --  133*  K 4.7  --  4.0  CL 103  --  105  CO2 21*  --  21*  GLUCOSE 129*  --  123*  BUN 30*  --  28*  CREATININE 1.00   < > 0.93  CALCIUM 8.7*  --  7.7*  AST 27  --   --   ALT 10*  --   --   ALKPHOS 132*  --   --   BILITOT 0.7  --   --    < > = values in this interval not displayed.   RADIOLOGY:  No results found. ASSESSMENT AND PLAN:   * UTI   Continue IV ciprofloxacin and follow urine culture with 30,000 colonies of gram-negative rods sensitivity pending Patient has leukocytosis WBC count is 12.2-13 point 3 repeat CBC  in a.m. trending up  *  Colitis resolved   IV Cipro and Flagyl will be changed to p.o.  Sepsis on admission, resolved.  * hypertension   Continue home medication.  Hold if blood pressure is low.  * hyperlipidemia   Continue atorvastatin.  * hypothyroidism   Continue levothyroxine.  Generalized weakness.  PT evaluation suggest home health with PT. All the records are reviewed and case discussed with  Care Management/Social Worker. Management plans discussed with the patient, family and they are in agreement.  CODE STATUS: DNR  TOTAL TIME TAKING CARE OF THIS PATIENT: 32 minutes.   More than 50% of the time was spent in counseling/coordination of care: YES  POSSIBLE D/C IN 1  DAYS, DEPENDING ON CLINICAL CONDITION.   Nicholes Mango M.D on 09/26/2017 at 3:12 PM  Between 7am to 6pm - Pager - [phone]  After 6pm go to [email or web address] - [facility]

## 2017-09-27 ENCOUNTER — Telehealth: Payer: Self-pay | Admitting: Physician Assistant

## 2017-09-27 LAB — CBC
HCT: 27.4 % — ABNORMAL LOW (ref 35.0–47.0)
HEMOGLOBIN: 9.3 g/dL — AB (ref 12.0–16.0)
MCH: 30.2 pg (ref 26.0–34.0)
MCHC: 33.8 g/dL (ref 32.0–36.0)
MCV: 89.4 fL (ref 80.0–100.0)
Platelets: 258 10*3/uL (ref 150–440)
RBC: 3.07 MIL/uL — ABNORMAL LOW (ref 3.80–5.20)
RDW: 15.4 % — ABNORMAL HIGH (ref 11.5–14.5)
WBC: 6.5 10*3/uL (ref 3.6–11.0)

## 2017-09-27 LAB — BASIC METABOLIC PANEL
ANION GAP: 5 (ref 5–15)
BUN: 18 mg/dL (ref 6–20)
CHLORIDE: 107 mmol/L (ref 101–111)
CO2: 23 mmol/L (ref 22–32)
Calcium: 7.8 mg/dL — ABNORMAL LOW (ref 8.9–10.3)
Creatinine, Ser: 0.84 mg/dL (ref 0.44–1.00)
GFR calc non Af Amer: 59 mL/min — ABNORMAL LOW (ref >60)
Glucose, Bld: 106 mg/dL — ABNORMAL HIGH (ref 65–99)
Potassium: 4.2 mmol/L (ref 3.5–5.1)
Sodium: 135 mmol/L (ref 135–145)

## 2017-09-27 LAB — URINE CULTURE

## 2017-09-27 MED ORDER — DOCUSATE SODIUM 100 MG PO CAPS: 100.0000 mg | ORAL_CAPSULE | Freq: Two times a day (BID) | ORAL | 0 refills | Status: DC | PRN

## 2017-09-27 MED ORDER — TRAMADOL HCL 50 MG PO TABS: 50.0000 mg | ORAL_TABLET | Freq: Four times a day (QID) | ORAL | 0 refills | Status: DC | PRN

## 2017-09-27 MED ORDER — ONDANSETRON HCL 4 MG/2ML IJ SOLN
4.0000 mg | Freq: Four times a day (QID) | INTRAMUSCULAR | Status: DC | PRN
  Filled 2017-09-27: qty 2

## 2017-09-27 MED ORDER — ONDANSETRON HCL 4 MG PO TABS: 4.0000 mg | ORAL_TABLET | Freq: Once | ORAL | Status: DC

## 2017-09-27 MED ORDER — ONDANSETRON 4 MG PO TBDP: 4.0000 mg | ORAL_TABLET | Freq: Three times a day (TID) | ORAL | 0 refills | Status: DC | PRN

## 2017-09-27 MED ORDER — CIPROFLOXACIN HCL 500 MG PO TABS: 500.0000 mg | ORAL_TABLET | Freq: Two times a day (BID) | ORAL | Status: DC

## 2017-09-27 MED ORDER — METRONIDAZOLE 500 MG PO TABS: 500.0000 mg | ORAL_TABLET | Freq: Three times a day (TID) | ORAL | 0 refills | Status: DC

## 2017-09-27 MED ORDER — CIPROFLOXACIN HCL 500 MG PO TABS: 500.0000 mg | ORAL_TABLET | Freq: Two times a day (BID) | ORAL | 0 refills | Status: DC

## 2017-09-27 NOTE — Telephone Encounter (Signed)
Can we do TCC?  Thanks.

## 2017-09-27 NOTE — Discharge Summary (Signed)
Chelsea [name] at [place] NAME: Chelsea [name]    MR#:  [id number]  DATE OF BIRTH:  [birth year]  DATE OF ADMISSION:  09/24/2017 ADMITTING PHYSICIAN: Vaughan Basta, MD  DATE OF DISCHARGE: 09/27/17  PRIMARY CARE PHYSICIAN: Mar Daring, PA-C    ADMISSION DIAGNOSIS:  Colitis [K52.9] Acute cystitis with hematuria [N30.01] Sepsis, due to unspecified organism ([place]) [A41.9]  DISCHARGE DIAGNOSIS:  Principal Problem:   Colitis Active Problems:   Acute lower UTI   UTI (urinary tract infection)   SECONDARY DIAGNOSIS:   Past Medical History:  Diagnosis Date  . Asthma   . Bradycardia    hx of it and fatigue with beta blockade  . Carotid artery disease ([place])    s/p carotid endarterectomy  . Coronary artery disease    a. s/p 2 vessel CABG 1989; b. echo 2003: nl; c. cath 2006: patent grafts, EF 65%; d. nuclear stress test 2007: no ischemia, EF 81%  . Degenerative disc disease   . History of hyperkalemia   . Hyperlipidemia   . Hypertension   . Mitral valve prolapse   . Norovirus 2014  . Osteoporosis   . Spinal stenosis in cervical region   . Stroke [facility]) 1980s   left brain    HOSPITAL COURSE:  HISTORY OF PRESENT ILLNESS: Chelsea [name]  is a 84 y.o. female with a known history of asthma, coronary artery disease, hyperkalemia, hyperlipidemia, hypertension, stroke- lives in a retirement community, came to emergency room as she had fever since yesterday. She was also feeling weak. She had urinary frequency and some diarrhea since yesterday at her facility. On CT scan abdomen she is noted to have some colitis and her UA was positive with some lactic acidosis so ER physician suggested to admit to hospitalist service for further management.  UTI urine culture with 30,000 colonies of gram-negative rods , sensitive to ciprofloxacin Patient has leukocytosis resolved  Discharge patient with p.o. ciprofloxacin    *  Colitis resolved Stool test wa not done Patient clinically improved with ciprofloxacin and Flagyl will discharge patient with p.o. ciprofloxacin and Flagyl, Zofran pain management as needed   Sepsis on admission, secondary to colitis resolved.  * hypertension Continue home medication.  Hold if blood pressure is low.  * hyperlipidemia Continue atorvastatin.  * hypothyroidism Continue levothyroxine.   Home health PT  DISCHARGE CONDITIONS:   stable  CONSULTS OBTAINED:     PROCEDURES none   DRUG ALLERGIES:   Allergies  Allergen Reactions  . Sulfa Antibiotics     Other reaction(s): Dizziness  . Sulfonamide Derivatives Other (See Comments)    Reaction:  Dizziness     DISCHARGE MEDICATIONS:   Allergies as of 09/27/2017      Reactions   Sulfa Antibiotics    Other reaction(s): Dizziness   Sulfonamide Derivatives Other (See Comments)   Reaction:  Dizziness       Medication List    STOP taking these medications   levofloxacin 250 MG tablet Commonly known as:  LEVAQUIN     TAKE these medications   albuterol 108 (90 Base) MCG/ACT inhaler Commonly known as:  PROVENTIL HFA;VENTOLIN HFA Inhale 2 puffs into the lungs every 6 (six) hours as needed for wheezing or shortness of breath.   amLODipine 10 MG tablet Commonly known as:  NORVASC take 1 tablet by mouth once daily   atorvastatin 80 MG tablet Commonly known as:  LIPITOR Take 1 tablet (80 mg total) by mouth  daily.   benzonatate 100 MG capsule Commonly known as:  TESSALON Take 1 capsule (100 mg total) by mouth 2 (two) times daily as needed for cough.   BREO ELLIPTA 100-25 MCG/INH Aepb Generic drug:  fluticasone furoate-vilanterol Inhale 1 puff into the lungs daily.   ciprofloxacin 500 MG tablet Commonly known as:  CIPRO Take 1 tablet (500 mg total) by mouth 2 (two) times daily.   docusate sodium 100 MG capsule Commonly known as:  COLACE Take 1 capsule (100 mg total) by mouth 2 (two)  times daily as needed for mild constipation.   guaiFENesin 100 MG/5ML liquid Commonly known as:  ROBITUSSIN Take 200 mg by mouth 3 (three) times daily as needed for cough.   ibuprofen 200 MG tablet Commonly known as:  ADVIL,MOTRIN Take 200 mg by mouth every 6 (six) hours as needed for mild pain.   lactulose 10 GM/15ML solution Commonly known as:  CHRONULAC Take 15 mLs (10 g total) by mouth 2 (two) times daily as needed for moderate constipation.   levothyroxine 75 MCG tablet Commonly known as:  SYNTHROID, LEVOTHROID Take 75 mcg by mouth daily. What changed:  Another medication with the same name was removed. Continue taking this medication, and follow the directions you see here.   loratadine 10 MG tablet Commonly known as:  CLARITIN TAKE 1 TABLET BY MOUTH ONCE DAILY   LORazepam 0.5 MG tablet Commonly known as:  ATIVAN Take 0.5 mg by mouth at bedtime as needed for anxiety.   metaxalone 800 MG tablet Commonly known as:  SKELAXIN Take 1 tablet (800 mg total) by mouth 3 (three) times daily as needed for muscle spasms. What changed:  when to take this   metroNIDAZOLE 500 MG tablet Commonly known as:  FLAGYL Take 1 tablet (500 mg total) by mouth every 8 (eight) hours.   nitroGLYCERIN 0.4 MG SL tablet Commonly known as:  NITROSTAT Place 1 tablet (0.4 mg total) under the tongue every 5 (five) minutes as needed. What changed:  reasons to take this   NONFORMULARY OR COMPOUNDED ITEM [facility]  Peripheral Neuropathy Cream- Bupivacaine 1%, Doxepin 3%, Gabapentin 6%, Pentoxifylline 3%, Topiramate 1% Apply 1-2 grams to affected area 3-4 times daily Qty. 120 gm 3 refills   ondansetron 4 MG disintegrating tablet Commonly known as:  ZOFRAN ODT Take 1 tablet (4 mg total) by mouth every 8 (eight) hours as needed for nausea or vomiting.   PRESERVISION AREDS PO Take 1 capsule by mouth daily.   RA SENNA PLUS 8.6-50 MG tablet Generic drug:  senna-docusate Take 1 tablet by  mouth daily.   sertraline 100 MG tablet Commonly known as:  ZOLOFT Take 1 tablet (100 mg total) by mouth daily.   tiotropium 18 MCG inhalation capsule Commonly known as:  SPIRIVA HANDIHALER Place 1 capsule (18 mcg total) into inhaler and inhale daily.   traMADol 50 MG tablet Commonly known as:  ULTRAM Take 1 tablet (50 mg total) by mouth every 6 (six) hours as needed.   triamcinolone cream 0.1 % Commonly known as:  KENALOG Apply 1 application topically 2 (two) times daily.        DISCHARGE INSTRUCTIONS:   Follow-up with primary care physician in 5-7 days or sooner as needed  DIET:  Cardiac diet  DISCHARGE CONDITION:  Fair  ACTIVITY:  Activity as tolerated  OXYGEN:  Home Oxygen: No.   Oxygen Delivery: room air  DISCHARGE LOCATION:  home   If you experience worsening of your admission symptoms,  develop shortness of breath, life threatening emergency, suicidal or homicidal thoughts you must seek medical attention immediately by calling 911 or calling your MD immediately  if symptoms less severe.  You Must read complete instructions/literature along with all the possible adverse reactions/side effects for all the Medicines you take and that have been prescribed to you. Take any new Medicines after you have completely understood and accpet all the possible adverse reactions/side effects.   Please note  You were cared for by a hospitalist during your hospital stay. If you have any questions about your discharge medications or the care you received while you were in the hospital after you are discharged, you can call the unit and asked to speak with the hospitalist on call if the hospitalist that took care of you is not available. Once you are discharged, your primary care physician will handle any further medical issues. Please note that NO REFILLS for any discharge medications will be authorized once you are discharged, as it is imperative that you return to your primary  care physician (or establish a relationship with a primary care physician if you do not have one) for your aftercare needs so that they can reassess your need for medications and monitor your lab values.     Today  Chief Complaint  Patient presents with  . Code Sepsis   Patient is doing much better.  Intermittent episodes of nausea but resolving with Zofran.  No vomiting or diarrhea.  Wants to go home  ROS:  CONSTITUTIONAL: Denies fevers, chills. Denies any fatigue, weakness.  EYES: Denies blurry vision, double vision, eye pain. EARS, NOSE, THROAT: Denies tinnitus, ear pain, hearing loss. RESPIRATORY: Denies cough, wheeze, shortness of breath.  CARDIOVASCULAR: Denies chest pain, palpitations, edema.  GASTROINTESTINAL: Denies vomiting, diarrhea, abdominal pain. Denies bright red blood per rectum. GENITOURINARY: Denies dysuria, hematuria. ENDOCRINE: Denies nocturia or thyroid problems. HEMATOLOGIC AND LYMPHATIC: Denies easy bruising or bleeding. SKIN: Denies rash or lesion. MUSCULOSKELETAL: Denies pain in neck, back, shoulder, knees, hips or arthritic symptoms.  NEUROLOGIC: Denies paralysis, paresthesias.  PSYCHIATRIC: Denies anxiety or depressive symptoms.   VITAL SIGNS:  Blood pressure (!) 139/48, pulse 69, temperature 98.2 F (36.8 C), temperature source Oral, resp. rate 18, height 5\' 2"  (1.575 m), weight 58.9 kg (129 lb 13.6 oz), SpO2 93 %.  I/O:    Intake/Output Summary (Last 24 hours) at 09/27/2017 1131 Last data filed at 09/27/2017 0923 Gross per 24 hour  Intake 240 ml  Output 1500 ml  Net -1260 ml    PHYSICAL EXAMINATION:  GENERAL:  84 y.o.-year-old patient lying in the bed with no acute distress.  EYES: Pupils equal, round, reactive to light and accommodation. No scleral icterus. Extraocular muscles intact.  HEENT: Head atraumatic, normocephalic. Oropharynx and nasopharynx clear.  NECK:  Supple, no jugular venous distention. No thyroid enlargement, no tenderness.   LUNGS: Normal breath sounds bilaterally, no wheezing, rales,rhonchi or crepitation. No use of accessory muscles of respiration.  CARDIOVASCULAR: S1, S2 normal. No murmurs, rubs, or gallops.  ABDOMEN: Soft, non-tender, non-distended. Bowel sounds present. No organomegaly or mass.  EXTREMITIES: No pedal edema, cyanosis, or clubbing.  NEUROLOGIC: Cranial nerves II through XII are intact. Muscle strength 5/5 in all extremities. Sensation intact. Gait not checked.  PSYCHIATRIC: The patient is alert and oriented x 3.  SKIN: No obvious rash, lesion, or ulcer.   DATA REVIEW:   CBC Recent Labs  Lab 09/27/17 0641  WBC 6.5  HGB 9.3*  HCT 27.4*  PLT  [place]  Lab 09/24/17 1825  09/27/17 0641  NA 135   < > 135  K 4.7   < > 4.2  CL 103   < > 107  CO2 21*   < > 23  GLUCOSE 129*   < > 106*  BUN 30*   < > 18  CREATININE 1.00   < > 0.84  CALCIUM 8.7*   < > 7.8*  AST 27  --   --   ALT 10*  --   --   ALKPHOS 132*  --   --   BILITOT 0.7  --   --    < > = values in this interval not displayed.    Cardiac Enzymes No results for input(s): TROPONINI in the last 168 hours.  Microbiology Results  Results for orders placed or performed during the hospital encounter of 09/24/17  Culture, blood (Routine x 2)     Status: None (Preliminary result)   Collection Time: 09/24/17  6:25 PM  Result Value Ref Range Status   Specimen Description   Final    BLOOD Blood Culture results may not be optimal due to an inadequate volume of blood received in culture bottles   Special Requests   Final    BOTTLES DRAWN AEROBIC AND ANAEROBIC RIGHT ANTECUBITAL   Culture   Final    NO GROWTH 3 DAYS Performed at [facility], [address] [place], [place] [address]    Report Status PENDING  Incomplete  Culture, blood (Routine x 2)     Status: None (Preliminary result)   Collection Time: 09/24/17  6:31 PM  Result Value Ref Range Status   Specimen Description BLOOD Blood  Culture adequate volume  Final   Special Requests   Final    BOTTLES DRAWN AEROBIC AND ANAEROBIC LEFT ANTECUBITAL   Culture   Final    NO GROWTH 3 DAYS Performed at [facility], [address] [place], [place] [address]    Report Status PENDING  Incomplete  Urine Culture     Status: Abnormal   Collection Time: 09/24/17  6:55 PM  Result Value Ref Range Status   Specimen Description   Final    URINE, RANDOM Performed at [facility], [address] [place], [place] [address]    Special Requests   Final    NONE Performed at [facility], [address] [place], [place] [address]    Culture 30,000 COLONIES/mL ESCHERICHIA COLI (A)  Final   Report Status 09/27/2017 FINAL  Final   Organism ID, Bacteria ESCHERICHIA COLI (A)  Final      Susceptibility   Escherichia coli - MIC*    AMPICILLIN 4 SENSITIVE Sensitive     CEFAZOLIN >=64 RESISTANT Resistant     CEFTRIAXONE <=1 SENSITIVE Sensitive     CIPROFLOXACIN <=0.25 SENSITIVE Sensitive     GENTAMICIN <=1 SENSITIVE Sensitive     IMIPENEM <=0.25 SENSITIVE Sensitive     NITROFURANTOIN <=16 SENSITIVE Sensitive     TRIMETH/SULFA <=20 SENSITIVE Sensitive     AMPICILLIN/SULBACTAM <=2 SENSITIVE Sensitive     PIP/TAZO <=4 SENSITIVE Sensitive     Extended ESBL NEGATIVE Sensitive     * 30,000 COLONIES/mL ESCHERICHIA COLI    RADIOLOGY:  Dg Chest 2 View  Result Date: 09/24/2017 CLINICAL DATA:  Fever and loose stools times 24 hours.  Sepsis. EXAM: CHEST - 2 VIEW COMPARISON:  Chest CT 07/01/2014, CXR ulnar 07/03/2017  FINDINGS: Heart is top-normal in size. Status post CABG. There is moderate aortic atherosclerosis. Calcified and noncalcified hilar lymph nodes compatible with old granulomatous disease. 8 mm nodular opacity in the right upper lobe may reflect a pulmonary nodule or pleural thickening. Chronic interstitial prominence especially at the left lung base. Findings may reflect areas of chronic atelectasis and/or  scarring. Chronic right-sided rib fractures without acute osseous abnormality. IMPRESSION: 1. No active cardiopulmonary disease. 2. Stigmata of old granulomatous disease including calcified and noncalcified appearing hilar lymph nodes and noncalcified appearing right upper lobe pulmonary 8 mm nodular opacity. 3. Chronic interstitial lung disease and probable fibrosis more so at the left lung base. 4. Remote right-sided rib fractures. Electronically Signed   By: Ashley Royalty M.D.   On: 09/24/2017 19:23   Ct Abdomen Pelvis W Contrast  Result Date: 09/24/2017 CLINICAL DATA:  84 year old female with loose stools for 24 hours. Not feeling well for the past 12 hours. Decreased food intake. Prior bladder suspension hysterectomy and cholecystectomy. Initial encounter. EXAM: CT ABDOMEN AND PELVIS WITH CONTRAST TECHNIQUE: Multidetector CT imaging of the abdomen and pelvis was performed using the standard protocol following bolus administration of intravenous contrast. CONTRAST:  22mL ISOVUE-300 IOPAMIDOL (ISOVUE-300) INJECTION 61% COMPARISON:  12/21/2016 CT. FINDINGS: Lower chest: Scarring lung bases. Heart size within normal limits. Coronary artery calcifications. Hepatobiliary: Post cholecystectomy. Stable appearance of mild prominence of intra and extrahepatic biliary ducts felt to be related to patient's age and post cholecystectomy state. No calcified common bile duct stone or obstructing lesion. Pancreas: No pancreatic mass or inflammation. Spleen: No splenic mass or enlargement. Adrenals/Urinary Tract: No obstructing stone or hydronephrosis. Left kidney is slightly atrophic. No worrisome renal or adrenal mass. Noncontrast filled views of the urinary bladder without gross abnormality. Stomach/Bowel: Moderate-size hiatal hernia. Circumferential thickening mid segment transverse colon. This may be related to under distension although mild colitis is also consideration. Vascular/Lymphatic: Prominent atherosclerotic  changes abdominal aorta and aortic branch vessels. No abdominal aortic aneurysm. Narrowing of portions of the celiac artery, superior mesenteric artery, renal arteries and inferior mesenteric artery without complete occlusion. Narrowing iliac arteries and femoral arteries. No adenopathy. Reproductive: Post hysterectomy.  No worrisome adnexal mass. Other: No free air or bowel containing hernia. Musculoskeletal: Scoliosis lower thoracic and lumbar spine. Chronic compression deformity of portions of T11, T12, L1, L2, L4 and L5 similar to the prior exam. Prior right hip replacement. IMPRESSION: Circumferential thickening mid segment transverse colon. This may be related to under distension although mild colitis is also consideration. Moderate-size hiatal hernia. Aortic Atherosclerosis (ICD10-I70.0). Narrowing of portions of the celiac artery, superior mesenteric artery, renal arteries, inferior mesenteric artery, iliac arteries and femoral arteries. Post cholecystectomy. Stable appearance of mild prominence of intra and extrahepatic biliary ducts felt to be related to patient's age and post cholecystectomy state. Scoliosis with chronic compression deformities similar to prior exam. Prior right hip replacement. Electronically Signed   By: Genia Del M.D.   On: 09/24/2017 20:25    EKG:   Orders placed or performed in visit on 11/03/16  . EKG 12-Lead      Management plans discussed with the patient, family and they are in agreement.  CODE STATUS:     Code Status Orders  (From admission, onward)        Start     Ordered   09/24/17 2258  Do not attempt resuscitation (DNR)  Continuous    Question Answer Comment  In the event of cardiac or respiratory ARREST Do  not call a "code blue"   In the event of cardiac or respiratory ARREST Do not perform Intubation, CPR, defibrillation or ACLS   In the event of cardiac or respiratory ARREST Use medication by any route, position, wound care, and other  measures to relive pain and suffering. May use oxygen, suction and manual treatment of airway obstruction as needed for comfort.      09/24/17 2258    Code Status History    Date Active Date Inactive Code Status Order ID Comments User Context   08/05/2015 1128 08/06/2015 1740 Full Code [id number]  Demetrios Loll, MD Inpatient   02/10/2015 2145 02/18/2015 1751 DNR [id number]  Gladstone Lighter, MD Inpatient      TOTAL TIME TAKING CARE OF THIS PATIENT: 43 minutes.   Note: This dictation was prepared with Dragon dictation along with smaller phrase technology. Any transcriptional errors that result from this process are unintentional.   @[facility] @  on 09/27/2017 at 11:31 AM  Between 7am to 6pm - Pager - [phone]  After 6pm go to [email or web address] - password [email or web address] [facility]  Office  [phone]  CC: Primary care physician; Mar Daring, PA-C

## 2017-09-27 NOTE — Telephone Encounter (Signed)
Pt is being discharged from hospital today 09/27/17 and is scheduled for hospital f/u on 10/01/17. Please advise. Thanks TNP

## 2017-09-27 NOTE — Discharge Instructions (Signed)
Follow-up with primary care physician in 5-7 days or sooner as needed °

## 2017-09-27 NOTE — Progress Notes (Signed)
PHARMACIST - PHYSICIAN COMMUNICATION DR:   Margaretmary Eddy CONCERNING: Antibiotic IV to Oral Route Change Policy  RECOMMENDATION: This patient is receiving Cipro by the intravenous route.  Based on criteria approved by the Pharmacy and Therapeutics Committee, the antibiotic(s) is/are being converted to the equivalent oral dose form(s).   DESCRIPTION: These criteria include:  The patient is not neutropenic and does not exhibit a GI malabsorption state  The patient is eating (either orally or via tube) and/or has been taking other orally administered medications for a least 24 hours  The patient is improving clinically and has a Tmax < 100.5  If you have questions about this conversion, please contact the Pharmacy Department  []   [phone] )  [facility] [x]   [phone] )  [facility] []   [phone] )  [facility] []   [phone] )  [facility] []   [phone] )  [place], PharmD Clinical Pharmacist

## 2017-09-27 NOTE — Care Management (Signed)
Patient to discharge back to [place] today.  PT has assessed patient and recommended home health PT.  Order for PT has been written.  CSW spoke to [facility], and their on site PT staff will follow up with patient.  Orders faxed to [phone].  RNCM signing off

## 2017-09-28 ENCOUNTER — Telehealth: Payer: Self-pay

## 2017-09-28 NOTE — Telephone Encounter (Signed)
Pt unavailable to speak at the time. Will CB after 10:30 AM.  -MM

## 2017-09-28 NOTE — Telephone Encounter (Signed)
Transition Care Management Follow-Up Telephone Call   Date discharged and where: [facility] on 09/27/17  How have you been since you were released from the hospital? Doing well, is still tired but has no s/s currently. Pt states her aid comes this afternoon to run errands for her. Pt is not driving currently.  Declines n/v/d, pain, fever, urinary abnormalities or shakiness. Pt is drinking lots of water.   Any patient concerns? None.  Items Reviewed:   Meds: verified  Allergies: verified  Dietary Changes Reviewed: None.   Functional Questionnaire:  Independent-I Dependent-D  ADLs:   Dressing- I    Eating- I   Maintaining continence- I   Transferring- I   Transportation- I, not currently due to medication.    Meal Prep- I   Managing Meds- I   Confirmed importance and Date/Time of follow-up visits scheduled: 10/08/17 @ 10:40 AM   Confirmed with patient if condition worsens to call PCP or go to the Emergency Dept. Patient was given office number and encouraged to call back with questions or concerns: YES

## 2017-09-28 NOTE — Telephone Encounter (Signed)
Absolutely, will wait until after 9 AM to call.  -MM

## 2017-09-29 LAB — CULTURE, BLOOD (ROUTINE X 2)
Culture: NO GROWTH
Culture: NO GROWTH
SPECIMEN DESCRIPTION: ADEQUATE

## 2017-10-01 ENCOUNTER — Inpatient Hospital Stay: Payer: Medicare Other | Admitting: Physician Assistant

## 2017-10-07 ENCOUNTER — Emergency Department
Admission: EM | Admit: 2017-10-07 | Discharge: 2017-10-07 | Disposition: A | Discharge status: Home / Self Care | Payer: Medicare Other | Attending: Emergency Medicine | Admitting: Emergency Medicine

## 2017-10-07 ENCOUNTER — Emergency Department: Payer: Medicare Other

## 2017-10-07 ENCOUNTER — Encounter: Payer: Self-pay | Admitting: Radiology

## 2017-10-07 ENCOUNTER — Other Ambulatory Visit: Payer: Self-pay

## 2017-10-07 DIAGNOSIS — E039 Hypothyroidism, unspecified: Secondary | ICD-10-CM | POA: Insufficient documentation

## 2017-10-07 DIAGNOSIS — I2510 Atherosclerotic heart disease of native coronary artery without angina pectoris: Secondary | ICD-10-CM | POA: Insufficient documentation

## 2017-10-07 DIAGNOSIS — K5900 Constipation, unspecified: Secondary | ICD-10-CM | POA: Insufficient documentation

## 2017-10-07 DIAGNOSIS — Z79899 Other long term (current) drug therapy: Secondary | ICD-10-CM | POA: Insufficient documentation

## 2017-10-07 DIAGNOSIS — R1084 Generalized abdominal pain: Secondary | ICD-10-CM

## 2017-10-07 DIAGNOSIS — Z951 Presence of aortocoronary bypass graft: Secondary | ICD-10-CM | POA: Insufficient documentation

## 2017-10-07 DIAGNOSIS — I10 Essential (primary) hypertension: Secondary | ICD-10-CM | POA: Diagnosis not present

## 2017-10-07 DIAGNOSIS — Z87891 Personal history of nicotine dependence: Secondary | ICD-10-CM | POA: Insufficient documentation

## 2017-10-07 DIAGNOSIS — N399 Disorder of urinary system, unspecified: Secondary | ICD-10-CM | POA: Diagnosis not present

## 2017-10-07 DIAGNOSIS — K449 Diaphragmatic hernia without obstruction or gangrene: Secondary | ICD-10-CM | POA: Diagnosis not present

## 2017-10-07 DIAGNOSIS — J449 Chronic obstructive pulmonary disease, unspecified: Secondary | ICD-10-CM | POA: Diagnosis not present

## 2017-10-07 LAB — COMPREHENSIVE METABOLIC PANEL
ALBUMIN: 3.8 g/dL (ref 3.5–5.0)
ALK PHOS: 97 U/L (ref 38–126)
ALT: 16 U/L (ref 14–54)
ANION GAP: 11 (ref 5–15)
AST: 35 U/L (ref 15–41)
BILIRUBIN TOTAL: 0.5 mg/dL (ref 0.3–1.2)
BUN: 21 mg/dL — AB (ref 6–20)
CALCIUM: 9 mg/dL (ref 8.9–10.3)
CO2: 21 mmol/L — AB (ref 22–32)
Chloride: 105 mmol/L (ref 101–111)
Creatinine, Ser: 0.93 mg/dL (ref 0.44–1.00)
GFR calc Af Amer: >60 mL/min (ref >60)
GFR calc non Af Amer: 52 mL/min — ABNORMAL LOW (ref >60)
GLUCOSE: 110 mg/dL — AB (ref 65–99)
POTASSIUM: 4.7 mmol/L (ref 3.5–5.1)
SODIUM: 137 mmol/L (ref 135–145)
Total Protein: 7.1 g/dL (ref 6.5–8.1)

## 2017-10-07 LAB — URINALYSIS, COMPLETE (UACMP) WITH MICROSCOPIC
Bilirubin Urine: NEGATIVE
Glucose, UA: NEGATIVE mg/dL
Hgb urine dipstick: NEGATIVE
Ketones, ur: NEGATIVE mg/dL
Nitrite: NEGATIVE
PROTEIN: NEGATIVE mg/dL
SPECIFIC GRAVITY, URINE: 1.01 (ref 1.005–1.030)
pH: 5 (ref 5.0–8.0)

## 2017-10-07 LAB — CBC
HEMATOCRIT: 34.6 % — AB (ref 35.0–47.0)
HEMOGLOBIN: 11.5 g/dL — AB (ref 12.0–16.0)
MCH: 29.5 pg (ref 26.0–34.0)
MCHC: 33.3 g/dL (ref 32.0–36.0)
MCV: 88.6 fL (ref 80.0–100.0)
Platelets: 458 10*3/uL — ABNORMAL HIGH (ref 150–440)
RBC: 3.9 MIL/uL (ref 3.80–5.20)
RDW: 16 % — AB (ref 11.5–14.5)
WBC: 6.3 10*3/uL (ref 3.6–11.0)

## 2017-10-07 LAB — LACTIC ACID, PLASMA: LACTIC ACID, VENOUS: 1.3 mmol/L (ref 0.5–1.9)

## 2017-10-07 LAB — LIPASE, BLOOD: Lipase: 27 U/L (ref 11–51)

## 2017-10-07 MED ORDER — IOHEXOL 350 MG/ML SOLN
75.0000 mL | Freq: Once | INTRAVENOUS | Status: AC | PRN
  Administered 2017-10-07: 75 mL via INTRAVENOUS

## 2017-10-07 MED ORDER — SENNOSIDES-DOCUSATE SODIUM 8.6-50 MG PO TABS
2.0000 | ORAL_TABLET | Freq: Once | ORAL | Status: AC
  Administered 2017-10-07: 2 via ORAL
  Filled 2017-10-07: qty 2

## 2017-10-07 MED ORDER — SODIUM CHLORIDE 0.9 % IV BOLUS
500.0000 mL | Freq: Once | INTRAVENOUS | Status: AC
  Administered 2017-10-07: 500 mL via INTRAVENOUS

## 2017-10-07 MED ORDER — SENNOSIDES-DOCUSATE SODIUM 8.6-50 MG PO TABS: 2.0000 | ORAL_TABLET | Freq: Two times a day (BID) | ORAL | 0 refills | Status: DC

## 2017-10-07 MED ORDER — POLYETHYLENE GLYCOL 3350 17 GM/SCOOP PO POWD: ORAL | 0 refills | Status: DC

## 2017-10-07 NOTE — Discharge Instructions (Signed)
Your labs and CT scan of the abdomen were unremarkable today. Your symptoms are likely due to constipation. Take laxatives until you have had multiple smooth bowel movements, and follow up with your doctor.

## 2017-10-07 NOTE — ED Provider Notes (Signed)
[facility] Emergency Department Provider Note  ____________________________________________  Time seen: Approximately 4:10 PM  I have reviewed the triage vital signs and the nursing notes.   HISTORY  Chief Complaint Abdominal Pain and Dysuria    HPI RASCHELLE [name] is a 84 y.o. female with a history of CAD, hypertension, stroke, mitral valve prolapse who complains of generalized abdominal pain, worsening for the past 2-3 days. No aggravating or alleviating factors. Nonradiating. Moderate intensity, sharp.She's noticed that she has decreased urine output over the last 24 hours as well despite drinking 5 large glasses of water yesterday she did not urinate at all yesterday. Denies fevers chills sweats. No chest pain, acute shortness of breath, or back pain.      Past Medical History:  Diagnosis Date  . Asthma   . Bradycardia    hx of it and fatigue with beta blockade  . Carotid artery disease ([place])    s/p carotid endarterectomy  . Coronary artery disease    a. s/p 2 vessel CABG 1989; b. echo 2003: nl; c. cath 2006: patent grafts, EF 65%; d. nuclear stress test 2007: no ischemia, EF 81%  . Degenerative disc disease   . History of hyperkalemia   . Hyperlipidemia   . Hypertension   . Mitral valve prolapse   . Norovirus 2014  . Osteoporosis   . Spinal stenosis in cervical region   . Stroke Chelsea [name]) 1980s   left brain     Patient Active Problem List   Diagnosis Date Noted  . Acute lower UTI 09/24/2017  . UTI (urinary tract infection) 09/24/2017  . Slow transit constipation 02/22/2017  . Low BP 11/11/2015  . Polyarthritis 08/05/2015  . Pulmonary fibrosis ([place]) 05/11/2015  . SOB (shortness of breath)   . Weakness   . Enterostenosis ([place]) 02/09/2015  . Peripheral blood vessel disorder ([place]) 02/09/2015  . 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 OP (osteoporosis) 02/09/2015  . Billowing mitral valve 02/09/2015  . Degeneration macular 02/09/2015   . Hypercholesteremia 02/09/2015  . Adult hypothyroidism 02/09/2015  . Cannot sleep 02/09/2015  . 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 Presence of aortocoronary bypass graft 02/09/2015  . 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 Esophagitis, reflux 02/09/2015  . Diverticulitis 02/09/2015  . Colon, diverticulosis 02/09/2015  . Constipation due to opioid therapy 02/09/2015  . CAD in native artery 02/09/2015  . Colitis 02/09/2015  . Allergic rhinitis 02/09/2015  . Back ache 02/09/2015  . COPD (chronic obstructive pulmonary disease) ([place]) 01/15/2015  . GERD (gastroesophageal reflux disease) 12/23/2014  . Spinal stenosis at L4-L5 level 12/16/2014  . Lumbar nerve root compression 12/16/2014  . Compression fracture of lumbar vertebra ([place]) 12/16/2014  . Lumbar canal stenosis 11/04/2013  . Neuritis or radiculitis due to rupture of lumbar intervertebral disc 11/04/2013  . DDD (degenerative disc disease), lumbar 11/04/2013  . Degeneration of intervertebral disc of lumbar region 11/04/2013  . Dermatophytosis of nail 03/31/2013  . Pain in limb 03/31/2013  . HTN (hypertension) 07/24/2011  . Hyperlipidemia 01/31/2010  . Carotid artery stenosis 12/21/2009  . CORONARY ATHEROSLERO AUTOL VEIN BYPASS GRAFT 05/25/2009  . Dyspnea on exertion 05/25/2009     Past Surgical History:  Procedure Laterality Date  . ABDOMINAL HYSTERECTOMY    . BLADDER SUSPENSION    . CAROTID ENDARTERECTOMY     left  . CHOLECYSTECTOMY    . CORONARY ARTERY BYPASS GRAFT    . HIP SURGERY  02/2011   right     Prior to Admission medications  Medication Sig Start Date End Date Taking? Authorizing Provider  albuterol (PROVENTIL HFA;VENTOLIN HFA) 108 (90 Base) MCG/ACT inhaler Inhale 2 puffs into the lungs every 6 (six) hours as needed for wheezing or shortness of breath. 02/07/16   Rockey Situ, Kathlene November, MD  amLODipine (NORVASC) 10 MG tablet take 1 tablet by mouth once daily 07/14/16   Trinna Post, PA-C  atorvastatin (LIPITOR) 80 MG  tablet Take 1 tablet (80 mg total) by mouth daily. 03/15/17   Minna Merritts, MD  benzonatate (TESSALON) 100 MG capsule Take 1 capsule (100 mg total) by mouth 2 (two) times daily as needed for cough. Patient not taking: Reported on 09/24/2017 07/03/17   Mar Daring, PA-C  ciprofloxacin (CIPRO) 500 MG tablet Take 1 tablet (500 mg total) by mouth 2 (two) times daily. 09/27/17   Nicholes Mango, MD  docusate sodium (COLACE) 100 MG capsule Take 1 capsule (100 mg total) by mouth 2 (two) times daily as needed for mild constipation. Patient not taking: Reported on 09/28/2017 09/27/17   Nicholes Mango, MD  fluticasone furoate-vilanterol (BREO ELLIPTA) 100-25 MCG/INH AEPB Inhale 1 puff into the lungs daily.    [provider]  guaiFENesin (ROBITUSSIN) 100 MG/5ML liquid Take 200 mg by mouth 3 (three) times daily as needed for cough.    [provider]  ibuprofen (ADVIL,MOTRIN) 200 MG tablet Take 200 mg by mouth every 6 (six) hours as needed for mild pain.    [provider]  lactulose (CHRONULAC) 10 GM/15ML solution Take 15 mLs (10 g total) by mouth 2 (two) times daily as needed for moderate constipation. Patient not taking: Reported on 09/24/2017 02/22/17   Mar Daring, PA-C  levothyroxine (SYNTHROID, LEVOTHROID) 75 MCG tablet Take 75 mcg by mouth daily.    [provider]  loratadine (CLARITIN) 10 MG tablet TAKE 1 TABLET BY MOUTH ONCE DAILY 08/29/17   Mar Daring, PA-C  LORazepam (ATIVAN) 0.5 MG tablet Take 0.5 mg by mouth at bedtime as needed for anxiety.    [provider]  metaxalone (SKELAXIN) 800 MG tablet Take 1 tablet (800 mg total) by mouth 3 (three) times daily as needed for muscle spasms. Patient not taking: Reported on 09/28/2017 02/18/15   Hillary Bow, MD  metroNIDAZOLE (FLAGYL) 500 MG tablet Take 1 tablet (500 mg total) by mouth every 8 (eight) hours. 09/27/17   Nicholes Mango, MD  Multiple Vitamins-Minerals (PRESERVISION AREDS PO) Take 1  capsule by mouth daily.     [provider]  nitroGLYCERIN (NITROSTAT) 0.4 MG SL tablet Place 1 tablet (0.4 mg total) under the tongue every 5 (five) minutes as needed. Patient taking differently: Place 0.4 mg under the tongue every 5 (five) minutes as needed for chest pain.  02/07/16   Minna Merritts, MD  NONFORMULARY OR COMPOUNDED ITEM [facility]  Peripheral Neuropathy Cream- Bupivacaine 1%, Doxepin 3%, Gabapentin 6%, Pentoxifylline 3%, Topiramate 1% Apply 1-2 grams to affected area 3-4 times daily Qty. 120 gm 3 refills    [provider]  ondansetron (ZOFRAN ODT) 4 MG disintegrating tablet Take 1 tablet (4 mg total) by mouth every 8 (eight) hours as needed for nausea or vomiting. Patient not taking: Reported on 09/28/2017 09/27/17   Nicholes Mango, MD  polyethylene glycol powder (GLYCOLAX/MIRALAX) powder 1 cap full in a full glass of water, two times a day for 3 days. 10/07/17   Carrie Mew, MD  senna-docusate (SENOKOT-S) 8.6-50 MG tablet Take 2 tablets by mouth 2 (two) times  daily. 10/07/17   Carrie Mew, MD  sertraline (ZOLOFT) 100 MG tablet Take 1 tablet (100 mg total) by mouth daily. 11/03/16   Mar Daring, PA-C  tiotropium (SPIRIVA HANDIHALER) 18 MCG inhalation capsule Place 1 capsule (18 mcg total) into inhaler and inhale daily. 03/19/15   Margarita Rana, MD  traMADol (ULTRAM) 50 MG tablet Take 1 tablet (50 mg total) by mouth every 6 (six) hours as needed. Patient not taking: Reported on 09/28/2017 09/27/17 09/27/18  Nicholes Mango, MD  triamcinolone cream (KENALOG) 0.1 % Apply 1 application topically 2 (two) times daily. Patient not taking: Reported on 09/24/2017 12/22/16   Mar Daring, PA-C     Allergies Sulfa antibiotics and Sulfonamide derivatives   Family History  Problem Relation Age of Onset  . Throat cancer Mother   . Stroke Father     Social History Social History   Tobacco Use  . Smoking status: Former Research scientist (life sciences)  . Smokeless  tobacco: Never Used  . Tobacco comment: quit 50 years ago  Substance Use Topics  . Alcohol use: No  . Drug use: No    Review of Systems  Constitutional:   No fever or chills.  ENT:   No sore throat. No rhinorrhea. Cardiovascular:   No chest pain or syncope. Respiratory:   No dyspnea or cough. Gastrointestinal:   positive as above for abdominal pain without vomiting and diarrhea.  Musculoskeletal:   Negative for focal pain or swelling All other systems reviewed and are negative except as documented above in ROS and HPI.  ____________________________________________   PHYSICAL EXAM:  VITAL SIGNS: ED Triage Vitals  Enc Vitals Group     BP 10/07/17 1500 (!) 152/52     Pulse Rate 10/07/17 1500 64     Resp 10/07/17 1500 16     Temp 10/07/17 1500 98 F (36.7 C)     Temp Source 10/07/17 1500 Oral     SpO2 10/07/17 1500 98 %     Weight 10/07/17 1502 129 lb (58.5 kg)     Height 10/07/17 1502 5\' 2"  (1.575 m)     Head Circumference --      Peak Flow --      Pain Score 10/07/17 1501 8     Pain Loc --      Pain Edu? --      Excl. in [place]? --     Vital signs reviewed, nursing assessments reviewed.   Constitutional:   Alert and oriented. Well appearing and in no distress. Eyes:   Conjunctivae are normal. EOMI. PERRL. ENT      Head:   Normocephalic and atraumatic.      Nose:   No congestion/rhinnorhea.       Mouth/Throat:   MMM, no pharyngeal erythema. No peritonsillar mass.       Neck:   No meningismus. Full ROM. Hematological/Lymphatic/Immunilogical:   No cervical lymphadenopathy. Cardiovascular:   RRR. Symmetric bilateral radial and DP pulses.  No murmurs.  Respiratory:   Normal respiratory effort without tachypnea/retractions. Breath sounds are clear and equal bilaterally. No wheezes/rales/rhonchi. Gastrointestinal:   Soft with generalized tenderness. Non distended. There is no CVA tenderness.  No rebound, rigidity, or guarding. Genitourinary:   deferred Musculoskeletal:    Normal range of motion in all extremities. No joint effusions.  No lower extremity tenderness.  No edema. Neurologic:   Normal speech and language.  Motor grossly intact. No acute focal neurologic deficits are appreciated.  Skin:    Skin is warm,  dry and intact. No rash noted.  No petechiae, purpura, or bullae.  ____________________________________________    LABS (pertinent positives/negatives) (all labs ordered are listed, but only abnormal results are displayed) Labs Reviewed  COMPREHENSIVE METABOLIC PANEL - Abnormal; Notable for the following components:      Result Value   CO2 21 (*)    Glucose, Bld 110 (*)    BUN 21 (*)    GFR calc non Af Amer 52 (*)    All other components within normal limits  CBC - Abnormal; Notable for the following components:   Hemoglobin 11.5 (*)    HCT 34.6 (*)    RDW 16.0 (*)    Platelets 458 (*)    All other components within normal limits  URINALYSIS, COMPLETE (UACMP) WITH MICROSCOPIC - Abnormal; Notable for the following components:   Color, Urine YELLOW (*)    APPearance HAZY (*)    Leukocytes, UA TRACE (*)    Squamous Epithelial / LPF 6-30 (*)    Non Squamous Epithelial 0-5 (*)    All other components within normal limits  URINE CULTURE  LIPASE, BLOOD  LACTIC ACID, PLASMA  LACTIC ACID, PLASMA   ____________________________________________   EKG    ____________________________________________    RADIOLOGY  Ct Angio Abd/pel W And/or Wo Contrast  Result Date: 10/07/2017 CLINICAL DATA:  Abdominal pain, question mesenteric ischemia EXAM: CTA ABDOMEN AND PELVIS wITHOUT AND WITH CONTRAST TECHNIQUE: Multidetector CT imaging of the abdomen and pelvis was performed using the standard protocol during bolus administration of intravenous contrast. Multiplanar reconstructed images and MIPs were obtained and reviewed to evaluate the vascular anatomy. CONTRAST:  70mL OMNIPAQUE IOHEXOL 350 MG/ML SOLN COMPARISON:  None. FINDINGS: VASCULAR Aorta:  Diffuse atherosclerotic calcifications. No aneurysm or dissection. Celiac: Calcified plaque at the origin with moderate luminal narrowing greater than 50%. SMA: Calcified plaque at the origin with mild to moderate luminal narrowing, approximately 50%. Renals: Single left renal artery and 2 right renal arteries. Calcified plaque at the origin with probable mild-to-moderate stenosis of the main right renal artery and the left renal artery. IMA: Patent Inflow: Tortuous with atherosclerotic calcifications throughout the iliac vessels. No aneurysm or dissection. Proximal Outflow: Calcifications.  No aneurysm or dissection. Veins: Grossly unremarkable. Review of the MIP images confirms the above findings. NON-VASCULAR Lower chest: Moderate-sized hiatal hernia. Scarring in the lung bases. Mild emphysema. No effusions. Heart is normal size. Moderate-sized hiatal hernia. Hepatobiliary: No focal liver abnormality is seen. Status post cholecystectomy. No biliary dilatation. Pancreas: No focal abnormality or ductal dilatation. Mild fatty replacement. Spleen: No focal abnormality.  Normal size. Adrenals/Urinary Tract: Cortical thinning bilaterally. No hydronephrosis or renal mass. No adrenal mass. Urinary bladder unremarkable. Stomach/Bowel: Moderate stool burden throughout the colon. No evidence for abnormal wall thickening or inflammation. No bowel obstruction. Lymphatic: No adenopathy. Reproductive: Prior hysterectomy.  No adnexal masses. Other: No free fluid or free air. Musculoskeletal: Leftward scoliosis. Severe diffuse degenerative disc disease and facet disease throughout the lumbar spine. Mild compression fractures involving T11, T12, L1 and L4. These changes are stable dating back to 2018. IMPRESSION: VASCULAR Diffuse aortic, iliac and branch vessel atherosclerosis. Mild-to-moderate luminal stenosis at the origin of the celiac artery, SMA, and bilateral renal arteries. NON-VASCULAR No bowel wall changes seen to suggest  ischemia or infection. No wall thickening. Large stool burden throughout the colon. Moderate-sized hiatal hernia. Bibasilar scarring, COPD. Multilevel compression fractures in the lower thoracic and lumbar spine, stable. Electronically Signed   By: Rolm Baptise M.D.  On: 10/07/2017 19:06    ____________________________________________   PROCEDURES Procedures  ____________________________________________  DIFFERENTIAL DIAGNOSIS   urinary retention, cystitis, pyelonephritis, mesenteric ischemia  CLINICAL IMPRESSION / ASSESSMENT AND PLAN / ED COURSE  Pertinent labs & imaging results that were available during my care of the patient were reviewed by me and considered in my medical decision making (see chart for details).    patient presents with generalized abdominal pain and decreased urine output. Labs do not show acute renal failure or any electrolyte abnormalities. She still has 3 doses of her ciprofloxacin left to treat the urinary tract infection that she was recently hospitalized for, and her current urinalysis is not indicative of an obvious urinary tract infection. Also, her previous urine culture confirms that her infection should be Cipro sensitive. We'll check a bladder scan to evaluate for urinary retention. If this is not the culprit for her symptoms, I think she will need a CT angiogram of the abdomen to evaluate for mesenteric ischemia this may have been responsible for the finding of transverse colitis seen on her last CT scan. Vital signs are unremarkable and she is not septic. I doubt GI bleed.  Clinical Course as of Oct 08 2007  Sun Oct 07, 2017  1650 Bladder scan wnl. Will CTA a/p. ivf   [PS]  2005 CT overall unremarkable. No evidence of gut ischemia. Does show likely constipation as cause of symptoms. I'll put her own senokot, miralax. F/u pcp.  CT Angio Abd/Pel W and/or Wo Contrast [PS]    Clinical Course User Index [PS] Carrie Mew, MD      ----------------------------------------- 8:09 PM on 10/07/2017 -----------------------------------------  Considering the patient's symptoms, medical history, and physical examination today, I have low suspicion for cholecystitis or biliary pathology, pancreatitis, perforation or bowel obstruction, hernia, intra-abdominal abscess, AAA or dissection, volvulus or intussusception, mesenteric ischemia, or appendicitis.    ____________________________________________   FINAL CLINICAL IMPRESSION(S) / ED DIAGNOSES    Final diagnoses:  Constipation, unspecified constipation type  Generalized abdominal pain     ED Discharge Orders        Ordered    senna-docusate (SENOKOT-S) 8.6-50 MG tablet  2 times daily     10/07/17 2008    polyethylene glycol powder (GLYCOLAX/MIRALAX) powder     10/07/17 2008      Portions of this note were generated with dragon dictation software. Dictation errors may occur despite best attempts at proofreading.    Carrie Mew, MD 10/07/17 2009

## 2017-10-07 NOTE — ED Triage Notes (Signed)
She arrives today from home with reports of abdominal pain and difficulty with urination  Recent admission with UTI diagnosis

## 2017-10-07 NOTE — ED Notes (Signed)
Bladder scanned - 135ml

## 2017-10-08 ENCOUNTER — Inpatient Hospital Stay: Payer: Medicare Other | Admitting: Physician Assistant

## 2017-10-09 LAB — URINE CULTURE: Culture: <10000 — AB

## 2017-10-11 DIAGNOSIS — F39 Unspecified mood [affective] disorder: Secondary | ICD-10-CM | POA: Diagnosis not present

## 2017-10-11 DIAGNOSIS — J439 Emphysema, unspecified: Secondary | ICD-10-CM | POA: Diagnosis not present

## 2017-10-11 DIAGNOSIS — I10 Essential (primary) hypertension: Secondary | ICD-10-CM | POA: Diagnosis not present

## 2017-10-11 DIAGNOSIS — R531 Weakness: Secondary | ICD-10-CM | POA: Diagnosis not present

## 2017-10-11 DIAGNOSIS — K529 Noninfective gastroenteritis and colitis, unspecified: Secondary | ICD-10-CM | POA: Diagnosis not present

## 2017-10-11 DIAGNOSIS — M4800 Spinal stenosis, site unspecified: Secondary | ICD-10-CM | POA: Diagnosis not present

## 2017-10-12 ENCOUNTER — Telehealth: Payer: Self-pay | Admitting: Physician Assistant

## 2017-10-12 DIAGNOSIS — K5900 Constipation, unspecified: Secondary | ICD-10-CM | POA: Diagnosis not present

## 2017-10-12 DIAGNOSIS — M6281 Muscle weakness (generalized): Secondary | ICD-10-CM | POA: Diagnosis not present

## 2017-10-12 DIAGNOSIS — R262 Difficulty in walking, not elsewhere classified: Secondary | ICD-10-CM | POA: Diagnosis not present

## 2017-10-16 DIAGNOSIS — R262 Difficulty in walking, not elsewhere classified: Secondary | ICD-10-CM | POA: Diagnosis not present

## 2017-10-16 DIAGNOSIS — M6281 Muscle weakness (generalized): Secondary | ICD-10-CM | POA: Diagnosis not present

## 2017-10-16 DIAGNOSIS — K5900 Constipation, unspecified: Secondary | ICD-10-CM | POA: Diagnosis not present

## 2017-10-18 DIAGNOSIS — M6281 Muscle weakness (generalized): Secondary | ICD-10-CM | POA: Diagnosis not present

## 2017-10-18 DIAGNOSIS — R2681 Unsteadiness on feet: Secondary | ICD-10-CM | POA: Diagnosis not present

## 2017-10-18 DIAGNOSIS — R262 Difficulty in walking, not elsewhere classified: Secondary | ICD-10-CM | POA: Diagnosis not present

## 2017-10-19 DIAGNOSIS — Y92120 Kitchen in nursing home as the place of occurrence of the external cause: Secondary | ICD-10-CM | POA: Diagnosis not present

## 2017-10-19 DIAGNOSIS — M25551 Pain in right hip: Secondary | ICD-10-CM | POA: Diagnosis not present

## 2017-10-19 DIAGNOSIS — M546 Pain in thoracic spine: Secondary | ICD-10-CM | POA: Diagnosis not present

## 2017-10-20 DIAGNOSIS — R262 Difficulty in walking, not elsewhere classified: Secondary | ICD-10-CM | POA: Diagnosis not present

## 2017-10-20 DIAGNOSIS — M6281 Muscle weakness (generalized): Secondary | ICD-10-CM | POA: Diagnosis not present

## 2017-10-20 DIAGNOSIS — R2681 Unsteadiness on feet: Secondary | ICD-10-CM | POA: Diagnosis not present

## 2017-10-22 DIAGNOSIS — R2681 Unsteadiness on feet: Secondary | ICD-10-CM | POA: Diagnosis not present

## 2017-10-22 DIAGNOSIS — R262 Difficulty in walking, not elsewhere classified: Secondary | ICD-10-CM | POA: Diagnosis not present

## 2017-10-22 DIAGNOSIS — M6281 Muscle weakness (generalized): Secondary | ICD-10-CM | POA: Diagnosis not present

## 2017-10-23 DIAGNOSIS — R2681 Unsteadiness on feet: Secondary | ICD-10-CM | POA: Diagnosis not present

## 2017-10-23 DIAGNOSIS — R262 Difficulty in walking, not elsewhere classified: Secondary | ICD-10-CM | POA: Diagnosis not present

## 2017-10-23 DIAGNOSIS — M6281 Muscle weakness (generalized): Secondary | ICD-10-CM | POA: Diagnosis not present

## 2017-10-24 DIAGNOSIS — M6281 Muscle weakness (generalized): Secondary | ICD-10-CM | POA: Diagnosis not present

## 2017-10-24 DIAGNOSIS — R262 Difficulty in walking, not elsewhere classified: Secondary | ICD-10-CM | POA: Diagnosis not present

## 2017-10-24 DIAGNOSIS — R2681 Unsteadiness on feet: Secondary | ICD-10-CM | POA: Diagnosis not present

## 2017-10-24 DIAGNOSIS — N390 Urinary tract infection, site not specified: Secondary | ICD-10-CM | POA: Diagnosis not present

## 2017-10-25 DIAGNOSIS — R2681 Unsteadiness on feet: Secondary | ICD-10-CM | POA: Diagnosis not present

## 2017-10-25 DIAGNOSIS — M6281 Muscle weakness (generalized): Secondary | ICD-10-CM | POA: Diagnosis not present

## 2017-10-25 DIAGNOSIS — R262 Difficulty in walking, not elsewhere classified: Secondary | ICD-10-CM | POA: Diagnosis not present

## 2017-10-29 ENCOUNTER — Ambulatory Visit: Payer: Medicare Other | Admitting: Podiatry

## 2017-10-29 DIAGNOSIS — M6281 Muscle weakness (generalized): Secondary | ICD-10-CM | POA: Diagnosis not present

## 2017-10-29 DIAGNOSIS — R2681 Unsteadiness on feet: Secondary | ICD-10-CM | POA: Diagnosis not present

## 2017-10-29 DIAGNOSIS — R262 Difficulty in walking, not elsewhere classified: Secondary | ICD-10-CM | POA: Diagnosis not present

## 2017-10-30 DIAGNOSIS — R2681 Unsteadiness on feet: Secondary | ICD-10-CM | POA: Diagnosis not present

## 2017-10-30 DIAGNOSIS — R262 Difficulty in walking, not elsewhere classified: Secondary | ICD-10-CM | POA: Diagnosis not present

## 2017-10-30 DIAGNOSIS — M6281 Muscle weakness (generalized): Secondary | ICD-10-CM | POA: Diagnosis not present

## 2017-10-31 DIAGNOSIS — R2681 Unsteadiness on feet: Secondary | ICD-10-CM | POA: Diagnosis not present

## 2017-10-31 DIAGNOSIS — M6281 Muscle weakness (generalized): Secondary | ICD-10-CM | POA: Diagnosis not present

## 2017-10-31 DIAGNOSIS — R262 Difficulty in walking, not elsewhere classified: Secondary | ICD-10-CM | POA: Diagnosis not present

## 2017-11-01 DIAGNOSIS — R262 Difficulty in walking, not elsewhere classified: Secondary | ICD-10-CM | POA: Diagnosis not present

## 2017-11-01 DIAGNOSIS — M6281 Muscle weakness (generalized): Secondary | ICD-10-CM | POA: Diagnosis not present

## 2017-11-01 DIAGNOSIS — R2681 Unsteadiness on feet: Secondary | ICD-10-CM | POA: Diagnosis not present

## 2017-11-02 DIAGNOSIS — M6281 Muscle weakness (generalized): Secondary | ICD-10-CM | POA: Diagnosis not present

## 2017-11-02 DIAGNOSIS — R262 Difficulty in walking, not elsewhere classified: Secondary | ICD-10-CM | POA: Diagnosis not present

## 2017-11-02 DIAGNOSIS — R2681 Unsteadiness on feet: Secondary | ICD-10-CM | POA: Diagnosis not present

## 2017-11-05 DIAGNOSIS — I10 Essential (primary) hypertension: Secondary | ICD-10-CM | POA: Diagnosis not present

## 2017-11-05 DIAGNOSIS — J439 Emphysema, unspecified: Secondary | ICD-10-CM | POA: Diagnosis not present

## 2017-11-05 DIAGNOSIS — F39 Unspecified mood [affective] disorder: Secondary | ICD-10-CM | POA: Diagnosis not present

## 2017-11-05 DIAGNOSIS — R05 Cough: Secondary | ICD-10-CM | POA: Diagnosis not present

## 2017-11-05 DIAGNOSIS — M4800 Spinal stenosis, site unspecified: Secondary | ICD-10-CM | POA: Diagnosis not present

## 2017-11-06 DIAGNOSIS — R2681 Unsteadiness on feet: Secondary | ICD-10-CM | POA: Diagnosis not present

## 2017-11-06 DIAGNOSIS — R262 Difficulty in walking, not elsewhere classified: Secondary | ICD-10-CM | POA: Diagnosis not present

## 2017-11-06 DIAGNOSIS — M6281 Muscle weakness (generalized): Secondary | ICD-10-CM | POA: Diagnosis not present

## 2017-11-07 DIAGNOSIS — M6281 Muscle weakness (generalized): Secondary | ICD-10-CM | POA: Diagnosis not present

## 2017-11-07 DIAGNOSIS — R2681 Unsteadiness on feet: Secondary | ICD-10-CM | POA: Diagnosis not present

## 2017-11-07 DIAGNOSIS — R262 Difficulty in walking, not elsewhere classified: Secondary | ICD-10-CM | POA: Diagnosis not present

## 2017-11-08 DIAGNOSIS — R262 Difficulty in walking, not elsewhere classified: Secondary | ICD-10-CM | POA: Diagnosis not present

## 2017-11-08 DIAGNOSIS — M6281 Muscle weakness (generalized): Secondary | ICD-10-CM | POA: Diagnosis not present

## 2017-11-08 DIAGNOSIS — R2681 Unsteadiness on feet: Secondary | ICD-10-CM | POA: Diagnosis not present

## 2017-11-09 DIAGNOSIS — R262 Difficulty in walking, not elsewhere classified: Secondary | ICD-10-CM | POA: Diagnosis not present

## 2017-11-09 DIAGNOSIS — M6281 Muscle weakness (generalized): Secondary | ICD-10-CM | POA: Diagnosis not present

## 2017-11-09 DIAGNOSIS — R2681 Unsteadiness on feet: Secondary | ICD-10-CM | POA: Diagnosis not present

## 2017-11-12 DIAGNOSIS — R2681 Unsteadiness on feet: Secondary | ICD-10-CM | POA: Diagnosis not present

## 2017-11-12 DIAGNOSIS — M6281 Muscle weakness (generalized): Secondary | ICD-10-CM | POA: Diagnosis not present

## 2017-11-12 DIAGNOSIS — R262 Difficulty in walking, not elsewhere classified: Secondary | ICD-10-CM | POA: Diagnosis not present

## 2017-11-13 DIAGNOSIS — R2681 Unsteadiness on feet: Secondary | ICD-10-CM | POA: Diagnosis not present

## 2017-11-13 DIAGNOSIS — R262 Difficulty in walking, not elsewhere classified: Secondary | ICD-10-CM | POA: Diagnosis not present

## 2017-11-13 DIAGNOSIS — M6281 Muscle weakness (generalized): Secondary | ICD-10-CM | POA: Diagnosis not present

## 2017-11-14 DIAGNOSIS — R2681 Unsteadiness on feet: Secondary | ICD-10-CM | POA: Diagnosis not present

## 2017-11-14 DIAGNOSIS — M6281 Muscle weakness (generalized): Secondary | ICD-10-CM | POA: Diagnosis not present

## 2017-11-14 DIAGNOSIS — R262 Difficulty in walking, not elsewhere classified: Secondary | ICD-10-CM | POA: Diagnosis not present

## 2017-11-15 DIAGNOSIS — R2681 Unsteadiness on feet: Secondary | ICD-10-CM | POA: Diagnosis not present

## 2017-11-15 DIAGNOSIS — R262 Difficulty in walking, not elsewhere classified: Secondary | ICD-10-CM | POA: Diagnosis not present

## 2017-11-15 DIAGNOSIS — M6281 Muscle weakness (generalized): Secondary | ICD-10-CM | POA: Diagnosis not present

## 2017-11-17 DIAGNOSIS — N390 Urinary tract infection, site not specified: Secondary | ICD-10-CM | POA: Diagnosis not present

## 2017-11-17 DIAGNOSIS — K5900 Constipation, unspecified: Secondary | ICD-10-CM | POA: Diagnosis not present

## 2017-11-17 DIAGNOSIS — R2681 Unsteadiness on feet: Secondary | ICD-10-CM | POA: Diagnosis not present

## 2017-11-17 DIAGNOSIS — M6281 Muscle weakness (generalized): Secondary | ICD-10-CM | POA: Diagnosis not present

## 2017-11-17 DIAGNOSIS — R109 Unspecified abdominal pain: Secondary | ICD-10-CM | POA: Diagnosis not present

## 2017-11-17 DIAGNOSIS — Z741 Need for assistance with personal care: Secondary | ICD-10-CM | POA: Diagnosis not present

## 2017-11-19 DIAGNOSIS — M6281 Muscle weakness (generalized): Secondary | ICD-10-CM | POA: Diagnosis not present

## 2017-11-19 DIAGNOSIS — Z741 Need for assistance with personal care: Secondary | ICD-10-CM | POA: Diagnosis not present

## 2017-11-19 DIAGNOSIS — R2681 Unsteadiness on feet: Secondary | ICD-10-CM | POA: Diagnosis not present

## 2017-11-19 DIAGNOSIS — R109 Unspecified abdominal pain: Secondary | ICD-10-CM | POA: Diagnosis not present

## 2017-11-19 DIAGNOSIS — K5900 Constipation, unspecified: Secondary | ICD-10-CM | POA: Diagnosis not present

## 2017-11-19 DIAGNOSIS — N390 Urinary tract infection, site not specified: Secondary | ICD-10-CM | POA: Diagnosis not present

## 2017-11-20 DIAGNOSIS — M6281 Muscle weakness (generalized): Secondary | ICD-10-CM | POA: Diagnosis not present

## 2017-11-20 DIAGNOSIS — N390 Urinary tract infection, site not specified: Secondary | ICD-10-CM | POA: Diagnosis not present

## 2017-11-20 DIAGNOSIS — K5900 Constipation, unspecified: Secondary | ICD-10-CM | POA: Diagnosis not present

## 2017-11-20 DIAGNOSIS — Z741 Need for assistance with personal care: Secondary | ICD-10-CM | POA: Diagnosis not present

## 2017-11-20 DIAGNOSIS — R2681 Unsteadiness on feet: Secondary | ICD-10-CM | POA: Diagnosis not present

## 2017-11-20 DIAGNOSIS — R109 Unspecified abdominal pain: Secondary | ICD-10-CM | POA: Diagnosis not present

## 2017-11-21 DIAGNOSIS — K5900 Constipation, unspecified: Secondary | ICD-10-CM | POA: Diagnosis not present

## 2017-11-21 DIAGNOSIS — R2681 Unsteadiness on feet: Secondary | ICD-10-CM | POA: Diagnosis not present

## 2017-11-21 DIAGNOSIS — N390 Urinary tract infection, site not specified: Secondary | ICD-10-CM | POA: Diagnosis not present

## 2017-11-21 DIAGNOSIS — M6281 Muscle weakness (generalized): Secondary | ICD-10-CM | POA: Diagnosis not present

## 2017-11-21 DIAGNOSIS — Z741 Need for assistance with personal care: Secondary | ICD-10-CM | POA: Diagnosis not present

## 2017-11-21 DIAGNOSIS — R109 Unspecified abdominal pain: Secondary | ICD-10-CM | POA: Diagnosis not present

## 2017-11-22 DIAGNOSIS — M48061 Spinal stenosis, lumbar region without neurogenic claudication: Secondary | ICD-10-CM | POA: Diagnosis not present

## 2017-11-22 DIAGNOSIS — I10 Essential (primary) hypertension: Secondary | ICD-10-CM | POA: Diagnosis not present

## 2017-11-22 DIAGNOSIS — R2681 Unsteadiness on feet: Secondary | ICD-10-CM | POA: Diagnosis not present

## 2017-11-22 DIAGNOSIS — F39 Unspecified mood [affective] disorder: Secondary | ICD-10-CM | POA: Diagnosis not present

## 2017-11-22 DIAGNOSIS — M6281 Muscle weakness (generalized): Secondary | ICD-10-CM | POA: Diagnosis not present

## 2017-11-22 DIAGNOSIS — J439 Emphysema, unspecified: Secondary | ICD-10-CM | POA: Diagnosis not present

## 2017-11-22 DIAGNOSIS — Z741 Need for assistance with personal care: Secondary | ICD-10-CM | POA: Diagnosis not present

## 2017-11-22 DIAGNOSIS — N390 Urinary tract infection, site not specified: Secondary | ICD-10-CM | POA: Diagnosis not present

## 2017-11-22 DIAGNOSIS — R109 Unspecified abdominal pain: Secondary | ICD-10-CM | POA: Diagnosis not present

## 2017-11-22 DIAGNOSIS — K5900 Constipation, unspecified: Secondary | ICD-10-CM | POA: Diagnosis not present

## 2017-11-27 DIAGNOSIS — R2681 Unsteadiness on feet: Secondary | ICD-10-CM | POA: Diagnosis not present

## 2017-11-27 DIAGNOSIS — Z741 Need for assistance with personal care: Secondary | ICD-10-CM | POA: Diagnosis not present

## 2017-11-27 DIAGNOSIS — R109 Unspecified abdominal pain: Secondary | ICD-10-CM | POA: Diagnosis not present

## 2017-11-27 DIAGNOSIS — N390 Urinary tract infection, site not specified: Secondary | ICD-10-CM | POA: Diagnosis not present

## 2017-11-27 DIAGNOSIS — K5900 Constipation, unspecified: Secondary | ICD-10-CM | POA: Diagnosis not present

## 2017-11-27 DIAGNOSIS — M6281 Muscle weakness (generalized): Secondary | ICD-10-CM | POA: Diagnosis not present

## 2017-11-28 ENCOUNTER — Ambulatory Visit: Payer: Medicare Other | Admitting: Physician Assistant

## 2017-11-28 DIAGNOSIS — M6281 Muscle weakness (generalized): Secondary | ICD-10-CM | POA: Diagnosis not present

## 2017-11-28 DIAGNOSIS — R2681 Unsteadiness on feet: Secondary | ICD-10-CM | POA: Diagnosis not present

## 2017-11-28 DIAGNOSIS — N390 Urinary tract infection, site not specified: Secondary | ICD-10-CM | POA: Diagnosis not present

## 2017-11-28 DIAGNOSIS — R109 Unspecified abdominal pain: Secondary | ICD-10-CM | POA: Diagnosis not present

## 2017-11-28 DIAGNOSIS — Z741 Need for assistance with personal care: Secondary | ICD-10-CM | POA: Diagnosis not present

## 2017-11-28 DIAGNOSIS — K5900 Constipation, unspecified: Secondary | ICD-10-CM | POA: Diagnosis not present

## 2017-11-29 DIAGNOSIS — R109 Unspecified abdominal pain: Secondary | ICD-10-CM | POA: Diagnosis not present

## 2017-11-29 DIAGNOSIS — N390 Urinary tract infection, site not specified: Secondary | ICD-10-CM | POA: Diagnosis not present

## 2017-11-29 DIAGNOSIS — R2681 Unsteadiness on feet: Secondary | ICD-10-CM | POA: Diagnosis not present

## 2017-11-29 DIAGNOSIS — M6281 Muscle weakness (generalized): Secondary | ICD-10-CM | POA: Diagnosis not present

## 2017-11-29 DIAGNOSIS — Z741 Need for assistance with personal care: Secondary | ICD-10-CM | POA: Diagnosis not present

## 2017-11-29 DIAGNOSIS — K5900 Constipation, unspecified: Secondary | ICD-10-CM | POA: Diagnosis not present

## 2017-12-03 ENCOUNTER — Ambulatory Visit (INDEPENDENT_AMBULATORY_CARE_PROVIDER_SITE_OTHER): Payer: Medicare Other | Admitting: Physician Assistant

## 2017-12-03 ENCOUNTER — Encounter: Payer: Self-pay | Admitting: Physician Assistant

## 2017-12-03 VITALS — BP 110/60 | HR 100 | Temp 97.4°F | Resp 18

## 2017-12-03 DIAGNOSIS — Z741 Need for assistance with personal care: Secondary | ICD-10-CM | POA: Diagnosis not present

## 2017-12-03 DIAGNOSIS — R109 Unspecified abdominal pain: Secondary | ICD-10-CM | POA: Diagnosis not present

## 2017-12-03 DIAGNOSIS — K591 Functional diarrhea: Secondary | ICD-10-CM

## 2017-12-03 DIAGNOSIS — K5900 Constipation, unspecified: Secondary | ICD-10-CM | POA: Diagnosis not present

## 2017-12-03 DIAGNOSIS — R2681 Unsteadiness on feet: Secondary | ICD-10-CM | POA: Diagnosis not present

## 2017-12-03 DIAGNOSIS — N390 Urinary tract infection, site not specified: Secondary | ICD-10-CM | POA: Diagnosis not present

## 2017-12-03 DIAGNOSIS — M6281 Muscle weakness (generalized): Secondary | ICD-10-CM | POA: Diagnosis not present

## 2017-12-03 NOTE — Progress Notes (Signed)
Patient: Chelsea [name] Female    DOB: May 28, 1924   84 y.o.   MRN: [id number] Visit Date: 12/03/2017  Today's Provider: Mar Daring, PA-C   Chief Complaint  Patient presents with  . Diarrhea   Subjective:    Diarrhea   This is a new problem. The current episode started 1 to 4 weeks ago (Worsening since Saturday). The problem occurs 2 to 4 times per day. The problem has been gradually improving. The stool consistency is described as watery. Associated symptoms include chills. Pertinent negatives include no abdominal pain, bloating, fever, headaches, sweats or vomiting. She has tried nothing for the symptoms. The treatment provided no relief.  She reports she thought she had stopped her Senna-S but reports this morning she found out she had still been taking 2 capsules daily. She does report she has not had a BM today.      Allergies  Allergen Reactions  . Sulfa Antibiotics     Other reaction(s): Dizziness  . Sulfonamide Derivatives Other (See Comments)    Reaction:  Dizziness      Current Outpatient Medications:  .  amLODipine (NORVASC) 10 MG tablet, take 1 tablet by mouth once daily, Disp: 90 tablet, Rfl: 3 .  atorvastatin (LIPITOR) 80 MG tablet, Take 1 tablet (80 mg total) by mouth daily., Disp: 90 tablet, Rfl: 3 .  fluticasone furoate-vilanterol (BREO ELLIPTA) 100-25 MCG/INH AEPB, Inhale 1 puff into the lungs daily., Disp: , Rfl:  .  Multiple Vitamins-Minerals (PRESERVISION AREDS PO), Take 1 capsule by mouth daily. , Disp: , Rfl:  .  sertraline (ZOLOFT) 100 MG tablet, Take 1 tablet (100 mg total) by mouth daily., Disp: 90 tablet, Rfl: 3 .  tiotropium (SPIRIVA HANDIHALER) 18 MCG inhalation capsule, Place 1 capsule (18 mcg total) into inhaler and inhale daily., Disp: 90 capsule, Rfl: 0 .  albuterol (PROVENTIL HFA;VENTOLIN HFA) 108 (90 Base) MCG/ACT inhaler, Inhale 2 puffs into the lungs every 6 (six) hours as needed for wheezing or shortness of breath. (Patient  not taking: Reported on 12/03/2017), Disp: 1 Inhaler, Rfl: 2 .  benzonatate (TESSALON) 100 MG capsule, Take 1 capsule (100 mg total) by mouth 2 (two) times daily as needed for cough. (Patient not taking: Reported on 09/24/2017), Disp: 20 capsule, Rfl: 0 .  ciprofloxacin (CIPRO) 500 MG tablet, Take 1 tablet (500 mg total) by mouth 2 (two) times daily. (Patient not taking: Reported on 12/03/2017), Disp: 20 tablet, Rfl: 0 .  docusate sodium (COLACE) 100 MG capsule, Take 1 capsule (100 mg total) by mouth 2 (two) times daily as needed for mild constipation. (Patient not taking: Reported on 09/28/2017), Disp: 10 capsule, Rfl: 0 .  guaiFENesin (ROBITUSSIN) 100 MG/5ML liquid, Take 200 mg by mouth 3 (three) times daily as needed for cough., Disp: , Rfl:  .  ibuprofen (ADVIL,MOTRIN) 200 MG tablet, Take 200 mg by mouth every 6 (six) hours as needed for mild pain., Disp: , Rfl:  .  lactulose (CHRONULAC) 10 GM/15ML solution, Take 15 mLs (10 g total) by mouth 2 (two) times daily as needed for moderate constipation. (Patient not taking: Reported on 09/24/2017), Disp: 240 mL, Rfl: 0 .  levothyroxine (SYNTHROID, LEVOTHROID) 75 MCG tablet, Take 75 mcg by mouth daily., Disp: , Rfl:  .  loratadine (CLARITIN) 10 MG tablet, TAKE 1 TABLET BY MOUTH ONCE DAILY, Disp: 90 tablet, Rfl: 1 .  LORazepam (ATIVAN) 0.5 MG tablet, Take 0.5 mg by mouth at bedtime as needed for  anxiety., Disp: , Rfl:  .  metaxalone (SKELAXIN) 800 MG tablet, Take 1 tablet (800 mg total) by mouth 3 (three) times daily as needed for muscle spasms. (Patient not taking: Reported on 09/28/2017), Disp: 15 tablet, Rfl: 0 .  metroNIDAZOLE (FLAGYL) 500 MG tablet, Take 1 tablet (500 mg total) by mouth every 8 (eight) hours., Disp: 30 tablet, Rfl: 0 .  nitroGLYCERIN (NITROSTAT) 0.4 MG SL tablet, Place 1 tablet (0.4 mg total) under the tongue every 5 (five) minutes as needed. (Patient taking differently: Place 0.4 mg under the tongue every 5 (five) minutes as needed for chest  pain. ), Disp: 25 tablet, Rfl: 6 .  NONFORMULARY OR COMPOUNDED ITEM, [facility]  Peripheral Neuropathy Cream- Bupivacaine 1%, Doxepin 3%, Gabapentin 6%, Pentoxifylline 3%, Topiramate 1% Apply 1-2 grams to affected area 3-4 times daily Qty. 120 gm 3 refills, Disp: , Rfl:  .  ondansetron (ZOFRAN ODT) 4 MG disintegrating tablet, Take 1 tablet (4 mg total) by mouth every 8 (eight) hours as needed for nausea or vomiting. (Patient not taking: Reported on 09/28/2017), Disp: 20 tablet, Rfl: 0 .  polyethylene glycol powder (GLYCOLAX/MIRALAX) powder, 1 cap full in a full glass of water, two times a day for 3 days. (Patient not taking: Reported on 12/03/2017), Disp: 255 g, Rfl: 0 .  senna-docusate (SENOKOT-S) 8.6-50 MG tablet, Take 2 tablets by mouth 2 (two) times daily. (Patient not taking: Reported on 12/03/2017), Disp: 120 tablet, Rfl: 0 .  traMADol (ULTRAM) 50 MG tablet, Take 1 tablet (50 mg total) by mouth every 6 (six) hours as needed. (Patient not taking: Reported on 09/28/2017), Disp: 20 tablet, Rfl: 0 .  triamcinolone cream (KENALOG) 0.1 %, Apply 1 application topically 2 (two) times daily. (Patient not taking: Reported on 09/24/2017), Disp: 30 g, Rfl: 0  Review of Systems  Constitutional: Positive for chills and fatigue. Negative for fever.  Respiratory: Negative.   Cardiovascular: Negative for chest pain, palpitations and leg swelling.  Gastrointestinal: Positive for constipation and diarrhea. Negative for abdominal pain, bloating, blood in stool and vomiting.  Neurological: Positive for weakness. Negative for headaches.    Social History   Tobacco Use  . Smoking status: Former Research scientist (life sciences)  . Smokeless tobacco: Never Used  . Tobacco comment: quit 50 years ago  Substance Use Topics  . Alcohol use: No   Objective:   BP 110/60 (BP Location: Left Arm, Patient Position: Sitting, Cuff Size: Normal)   Pulse 100   Temp (!) 97.4 F (36.3 C) (Oral)   Resp 18   SpO2 97%    Physical Exam    Constitutional: She appears well-developed and well-nourished. She appears lethargic. No distress.  Neck: Normal range of motion. Neck supple.  Cardiovascular: Normal rate and regular rhythm. Exam reveals no gallop and no friction rub.  Murmur heard. Pulmonary/Chest: Effort normal and breath sounds normal. No respiratory distress. She has no wheezes. She has no rales.  Abdominal: Soft. Bowel sounds are normal. She exhibits no distension and no mass. There is no tenderness. There is no rebound and no guarding.  Neurological: She appears lethargic.  Skin: She is not diaphoretic.  Vitals reviewed.      Assessment & Plan:     1. Functional diarrhea Patient reports that she has been still taking Senna-S two capsules daily. Patient does have constipation at baseline and does have bouts where she will develop diarrhea from the stool softeners and laxatives, but then will become more constipated again. Advised to hold Senna-S for  now unless she goes 2-3 days without a BM then start back. May use Imodium prn for diarrhea. She is to call if she develops abdominal cramping or bloating, diarrhea becomes bloody, or if she develops a fever. Patient was too tired and weak (recently had urosepsis and was hospitalized in April) from today's outing that she did not want to get labs or imaging at this time. Will use changes as noted above and will work up further if no improvements.        Mar Daring, PA-C  [facility]

## 2017-12-03 NOTE — Patient Instructions (Addendum)
Hold Senna-S for now; May restart if no BM over 2-3 days May use Imodium prn for diarrhea Push fluids, may continue boost prn If develops abdominal cramping or bloody diarrhea return to office for stool cultures

## 2017-12-04 DIAGNOSIS — R2681 Unsteadiness on feet: Secondary | ICD-10-CM | POA: Diagnosis not present

## 2017-12-04 DIAGNOSIS — M6281 Muscle weakness (generalized): Secondary | ICD-10-CM | POA: Diagnosis not present

## 2017-12-04 DIAGNOSIS — N390 Urinary tract infection, site not specified: Secondary | ICD-10-CM | POA: Diagnosis not present

## 2017-12-04 DIAGNOSIS — Z741 Need for assistance with personal care: Secondary | ICD-10-CM | POA: Diagnosis not present

## 2017-12-04 DIAGNOSIS — K5900 Constipation, unspecified: Secondary | ICD-10-CM | POA: Diagnosis not present

## 2017-12-04 DIAGNOSIS — R109 Unspecified abdominal pain: Secondary | ICD-10-CM | POA: Diagnosis not present

## 2017-12-06 ENCOUNTER — Other Ambulatory Visit: Payer: Self-pay | Admitting: Physician Assistant

## 2017-12-06 DIAGNOSIS — K5900 Constipation, unspecified: Secondary | ICD-10-CM | POA: Diagnosis not present

## 2017-12-06 DIAGNOSIS — R109 Unspecified abdominal pain: Secondary | ICD-10-CM | POA: Diagnosis not present

## 2017-12-06 DIAGNOSIS — R2681 Unsteadiness on feet: Secondary | ICD-10-CM | POA: Diagnosis not present

## 2017-12-06 DIAGNOSIS — I10 Essential (primary) hypertension: Secondary | ICD-10-CM

## 2017-12-06 DIAGNOSIS — Z741 Need for assistance with personal care: Secondary | ICD-10-CM | POA: Diagnosis not present

## 2017-12-06 DIAGNOSIS — M6281 Muscle weakness (generalized): Secondary | ICD-10-CM | POA: Diagnosis not present

## 2017-12-06 DIAGNOSIS — N390 Urinary tract infection, site not specified: Secondary | ICD-10-CM | POA: Diagnosis not present

## 2017-12-06 MED ORDER — AMLODIPINE BESYLATE 5 MG PO TABS: 10.0000 mg | ORAL_TABLET | Freq: Every day | ORAL | 3 refills | Status: AC

## 2017-12-10 DIAGNOSIS — Z741 Need for assistance with personal care: Secondary | ICD-10-CM | POA: Diagnosis not present

## 2017-12-10 DIAGNOSIS — R109 Unspecified abdominal pain: Secondary | ICD-10-CM | POA: Diagnosis not present

## 2017-12-10 DIAGNOSIS — R2681 Unsteadiness on feet: Secondary | ICD-10-CM | POA: Diagnosis not present

## 2017-12-10 DIAGNOSIS — K5900 Constipation, unspecified: Secondary | ICD-10-CM | POA: Diagnosis not present

## 2017-12-10 DIAGNOSIS — M6281 Muscle weakness (generalized): Secondary | ICD-10-CM | POA: Diagnosis not present

## 2017-12-10 DIAGNOSIS — N390 Urinary tract infection, site not specified: Secondary | ICD-10-CM | POA: Diagnosis not present

## 2017-12-11 DIAGNOSIS — M6281 Muscle weakness (generalized): Secondary | ICD-10-CM | POA: Diagnosis not present

## 2017-12-11 DIAGNOSIS — Z741 Need for assistance with personal care: Secondary | ICD-10-CM | POA: Diagnosis not present

## 2017-12-11 DIAGNOSIS — K5900 Constipation, unspecified: Secondary | ICD-10-CM | POA: Diagnosis not present

## 2017-12-11 DIAGNOSIS — R2681 Unsteadiness on feet: Secondary | ICD-10-CM | POA: Diagnosis not present

## 2017-12-11 DIAGNOSIS — R109 Unspecified abdominal pain: Secondary | ICD-10-CM | POA: Diagnosis not present

## 2017-12-11 DIAGNOSIS — N390 Urinary tract infection, site not specified: Secondary | ICD-10-CM | POA: Diagnosis not present

## 2017-12-13 DIAGNOSIS — R2681 Unsteadiness on feet: Secondary | ICD-10-CM | POA: Diagnosis not present

## 2017-12-13 DIAGNOSIS — Z741 Need for assistance with personal care: Secondary | ICD-10-CM | POA: Diagnosis not present

## 2017-12-13 DIAGNOSIS — N390 Urinary tract infection, site not specified: Secondary | ICD-10-CM | POA: Diagnosis not present

## 2017-12-13 DIAGNOSIS — M6281 Muscle weakness (generalized): Secondary | ICD-10-CM | POA: Diagnosis not present

## 2017-12-13 DIAGNOSIS — R109 Unspecified abdominal pain: Secondary | ICD-10-CM | POA: Diagnosis not present

## 2017-12-13 DIAGNOSIS — K5900 Constipation, unspecified: Secondary | ICD-10-CM | POA: Diagnosis not present

## 2017-12-17 DIAGNOSIS — Z741 Need for assistance with personal care: Secondary | ICD-10-CM | POA: Diagnosis not present

## 2017-12-17 DIAGNOSIS — N390 Urinary tract infection, site not specified: Secondary | ICD-10-CM | POA: Diagnosis not present

## 2017-12-17 DIAGNOSIS — M6281 Muscle weakness (generalized): Secondary | ICD-10-CM | POA: Diagnosis not present

## 2017-12-18 DIAGNOSIS — N390 Urinary tract infection, site not specified: Secondary | ICD-10-CM | POA: Diagnosis not present

## 2017-12-18 DIAGNOSIS — M6281 Muscle weakness (generalized): Secondary | ICD-10-CM | POA: Diagnosis not present

## 2017-12-18 DIAGNOSIS — Z741 Need for assistance with personal care: Secondary | ICD-10-CM | POA: Diagnosis not present

## 2017-12-19 DIAGNOSIS — N390 Urinary tract infection, site not specified: Secondary | ICD-10-CM | POA: Diagnosis not present

## 2017-12-19 DIAGNOSIS — Z741 Need for assistance with personal care: Secondary | ICD-10-CM | POA: Diagnosis not present

## 2017-12-19 DIAGNOSIS — M6281 Muscle weakness (generalized): Secondary | ICD-10-CM | POA: Diagnosis not present

## 2017-12-25 DIAGNOSIS — N390 Urinary tract infection, site not specified: Secondary | ICD-10-CM | POA: Diagnosis not present

## 2017-12-25 DIAGNOSIS — M6281 Muscle weakness (generalized): Secondary | ICD-10-CM | POA: Diagnosis not present

## 2017-12-25 DIAGNOSIS — Z741 Need for assistance with personal care: Secondary | ICD-10-CM | POA: Diagnosis not present

## 2017-12-26 DIAGNOSIS — Z741 Need for assistance with personal care: Secondary | ICD-10-CM | POA: Diagnosis not present

## 2017-12-26 DIAGNOSIS — M6281 Muscle weakness (generalized): Secondary | ICD-10-CM | POA: Diagnosis not present

## 2017-12-26 DIAGNOSIS — N390 Urinary tract infection, site not specified: Secondary | ICD-10-CM | POA: Diagnosis not present

## 2017-12-28 DIAGNOSIS — M6281 Muscle weakness (generalized): Secondary | ICD-10-CM | POA: Diagnosis not present

## 2017-12-28 DIAGNOSIS — N390 Urinary tract infection, site not specified: Secondary | ICD-10-CM | POA: Diagnosis not present

## 2017-12-28 DIAGNOSIS — Z741 Need for assistance with personal care: Secondary | ICD-10-CM | POA: Diagnosis not present

## 2017-12-31 ENCOUNTER — Encounter: Payer: Self-pay | Admitting: Physician Assistant

## 2017-12-31 ENCOUNTER — Ambulatory Visit (INDEPENDENT_AMBULATORY_CARE_PROVIDER_SITE_OTHER): Payer: Medicare Other | Admitting: Physician Assistant

## 2017-12-31 VITALS — BP 138/60 | HR 83 | Temp 97.7°F | Resp 18 | Wt 120.0 lb

## 2017-12-31 DIAGNOSIS — Z79899 Other long term (current) drug therapy: Secondary | ICD-10-CM | POA: Diagnosis not present

## 2017-12-31 DIAGNOSIS — M62838 Other muscle spasm: Secondary | ICD-10-CM | POA: Diagnosis not present

## 2017-12-31 MED ORDER — METAXALONE 800 MG PO TABS: 800.0000 mg | ORAL_TABLET | Freq: Three times a day (TID) | ORAL | 1 refills | Status: DC

## 2017-12-31 NOTE — Patient Instructions (Addendum)
Stopping sertraline:  Take 1/2 tab by mouth x 1 week then discontinue medication

## 2017-12-31 NOTE — Progress Notes (Signed)
Patient: Chelsea [name] Female    DOB: Aug 17, 1923   84 y.o.   MRN: [id number] Visit Date: 12/31/2017  Today's Provider: Mar Daring, PA-C   Chief Complaint  Patient presents with  . Follow-up   Subjective:    HPI Patient here today to follow up from [place] at [facility] and needs to discuss medications.  She reports she is doing much better than when she was last seen on 12/03/17 for diarrhea. She reports she has stopped her Senokot-S and diarrhea has improved. She would like to discuss medications and up date her medication list. She will be changing pharmacies to [place] through [facility]. She would like a new list printed once reviewed.     Allergies  Allergen Reactions  . Sulfa Antibiotics     Other reaction(s): Dizziness  . Sulfonamide Derivatives Other (See Comments)    Reaction:  Dizziness      Current Outpatient Medications:  .  amLODipine (NORVASC) 5 MG tablet, Take 2 tablets (10 mg total) by mouth daily., Disp: 90 tablet, Rfl: 3 .  atorvastatin (LIPITOR) 80 MG tablet, Take 1 tablet (80 mg total) by mouth daily., Disp: 90 tablet, Rfl: 3 .  Calcium Carbonate-Vit D-Min (CALCIUM 600+D PLUS MINERALS) 600-400 MG-UNIT TABS, Take by mouth., Disp: , Rfl:  .  Cholecalciferol (VITAMIN D3) 10000 units TABS, Take by mouth., Disp: , Rfl:  .  fluticasone furoate-vilanterol (BREO ELLIPTA) 100-25 MCG/INH AEPB, Inhale 1 puff into the lungs daily., Disp: , Rfl:  .  HYDROcodone-acetaminophen (NORCO/VICODIN) 5-325 MG tablet, , Disp: , Rfl:  .  Multiple Vitamins-Minerals (PRESERVISION AREDS PO), Take 1 capsule by mouth daily. , Disp: , Rfl:  .  tiotropium (SPIRIVA HANDIHALER) 18 MCG inhalation capsule, Place 1 capsule (18 mcg total) into inhaler and inhale daily., Disp: 90 capsule, Rfl: 0 .  traMADol (ULTRAM) 50 MG tablet, Take 50 mg by mouth every 6 (six) hours as needed., Disp: , Rfl:  .  vitamin C (ASCORBIC ACID) 500 MG tablet, Take 500 mg by mouth  daily., Disp: , Rfl:  .  LORazepam (ATIVAN) 0.5 MG tablet, Take 0.5 mg by mouth at bedtime as needed for anxiety., Disp: , Rfl:  .  metaxalone (SKELAXIN) 800 MG tablet, Take 1 tablet (800 mg total) by mouth 3 (three) times daily., Disp: 90 tablet, Rfl: 1 .  nitroGLYCERIN (NITROSTAT) 0.4 MG SL tablet, Place 1 tablet (0.4 mg total) under the tongue every 5 (five) minutes as needed. (Patient not taking: Reported on 12/31/2017), Disp: 25 tablet, Rfl: 6 .  NONFORMULARY OR COMPOUNDED ITEM, [facility]  Peripheral Neuropathy Cream- Bupivacaine 1%, Doxepin 3%, Gabapentin 6%, Pentoxifylline 3%, Topiramate 1% Apply 1-2 grams to affected area 3-4 times daily Qty. 120 gm 3 refills, Disp: , Rfl:  .  polyethylene glycol powder (GLYCOLAX/MIRALAX) powder, 1 cap full in a full glass of water, two times a day for 3 days. (Patient not taking: Reported on 12/03/2017), Disp: 255 g, Rfl: 0  Review of Systems  Constitutional: Negative.   Respiratory: Negative.   Cardiovascular: Negative.   Gastrointestinal: Negative.   Musculoskeletal: Negative.   Neurological: Negative.     Social History   Tobacco Use  . Smoking status: Former Research scientist (life sciences)  . Smokeless tobacco: Never Used  . Tobacco comment: quit 50 years ago  Substance Use Topics  . Alcohol use: No   Objective:   BP 138/60 (BP Location: Left Arm, Patient Position: Sitting, Cuff Size: Normal)  Pulse 83   Temp 97.7 F (36.5 C) (Oral)   Resp 18   Wt 120 lb (54.4 kg)   SpO2 96%   BMI 21.95 kg/m  Vitals:   12/31/17 1104  BP: 138/60  Pulse: 83  Resp: 18  Temp: 97.7 F (36.5 C)  TempSrc: Oral  SpO2: 96%  Weight: 120 lb (54.4 kg)     Physical Exam  Constitutional: She appears well-developed and well-nourished. No distress.  Neck: Normal range of motion. Neck supple. No JVD present. No tracheal deviation present. No thyromegaly present.  Cardiovascular: Normal rate, regular rhythm and normal heart sounds. Exam reveals no gallop and no friction  rub.  No murmur heard. Pulmonary/Chest: Effort normal and breath sounds normal. No respiratory distress. She has no wheezes. She has no rales.  Lymphadenopathy:    She has no cervical adenopathy.  Skin: She is not diaphoretic.  Vitals reviewed.       Assessment & Plan:     1. Medication management Went through each medication, marked what they were for and when to take. Some medications were duplicates and this was also marked on the bottles. A new medication list was printed for her and given at the end of the visit. When new medication refills are needed we will update with her new pharmacy. She is to call the office if she has any acute issue or questions in the meantime.  2. Muscle spasm Pulled just to update medication list.  - metaxalone (SKELAXIN) 800 MG tablet; Take 1 tablet (800 mg total) by mouth 3 (three) times daily.  Dispense: 90 tablet; Refill: Burns, PA-C  [facility]

## 2018-01-01 ENCOUNTER — Telehealth: Payer: Self-pay | Admitting: Physician Assistant

## 2018-01-01 DIAGNOSIS — Z741 Need for assistance with personal care: Secondary | ICD-10-CM | POA: Diagnosis not present

## 2018-01-01 DIAGNOSIS — N390 Urinary tract infection, site not specified: Secondary | ICD-10-CM | POA: Diagnosis not present

## 2018-01-01 DIAGNOSIS — M6281 Muscle weakness (generalized): Secondary | ICD-10-CM | POA: Diagnosis not present

## 2018-01-01 NOTE — Telephone Encounter (Signed)
Pt requesting we contact [facility] @ [phone] to confirm her Sertraline has been discontinued.    Pt also wanted to confirm Lipitor has been discontinued as well.   Please contact patient once pharmacy has been notified of change.

## 2018-01-02 DIAGNOSIS — N390 Urinary tract infection, site not specified: Secondary | ICD-10-CM | POA: Diagnosis not present

## 2018-01-02 DIAGNOSIS — Z741 Need for assistance with personal care: Secondary | ICD-10-CM | POA: Diagnosis not present

## 2018-01-02 DIAGNOSIS — M6281 Muscle weakness (generalized): Secondary | ICD-10-CM | POA: Diagnosis not present

## 2018-01-02 NOTE — Telephone Encounter (Signed)
[facility] advised as directed below and also spoke with Pt that was with Kasha at that moment that I called and reports that she is going through patient's medication and is going to let [facility] aware also.

## 2018-01-02 NOTE — Telephone Encounter (Signed)
Is this correct? Before I call.

## 2018-01-02 NOTE — Telephone Encounter (Signed)
Sertraline has been discontinued.   Lipitor has not. She had two sources of lipitor (atorvastatin): one blue bottle and one in a package. She needs to continue lipitor.   She discontinued her Senokot-S.

## 2018-01-03 DIAGNOSIS — N390 Urinary tract infection, site not specified: Secondary | ICD-10-CM | POA: Diagnosis not present

## 2018-01-03 DIAGNOSIS — Z741 Need for assistance with personal care: Secondary | ICD-10-CM | POA: Diagnosis not present

## 2018-01-03 DIAGNOSIS — M6281 Muscle weakness (generalized): Secondary | ICD-10-CM | POA: Diagnosis not present

## 2018-01-04 DIAGNOSIS — Z741 Need for assistance with personal care: Secondary | ICD-10-CM | POA: Diagnosis not present

## 2018-01-04 DIAGNOSIS — M6281 Muscle weakness (generalized): Secondary | ICD-10-CM | POA: Diagnosis not present

## 2018-01-04 DIAGNOSIS — N390 Urinary tract infection, site not specified: Secondary | ICD-10-CM | POA: Diagnosis not present

## 2018-01-08 DIAGNOSIS — M6281 Muscle weakness (generalized): Secondary | ICD-10-CM | POA: Diagnosis not present

## 2018-01-08 DIAGNOSIS — N390 Urinary tract infection, site not specified: Secondary | ICD-10-CM | POA: Diagnosis not present

## 2018-01-08 DIAGNOSIS — Z741 Need for assistance with personal care: Secondary | ICD-10-CM | POA: Diagnosis not present

## 2018-01-09 DIAGNOSIS — N390 Urinary tract infection, site not specified: Secondary | ICD-10-CM | POA: Diagnosis not present

## 2018-01-09 DIAGNOSIS — M6281 Muscle weakness (generalized): Secondary | ICD-10-CM | POA: Diagnosis not present

## 2018-01-09 DIAGNOSIS — Z741 Need for assistance with personal care: Secondary | ICD-10-CM | POA: Diagnosis not present

## 2018-01-11 DIAGNOSIS — M6281 Muscle weakness (generalized): Secondary | ICD-10-CM | POA: Diagnosis not present

## 2018-01-11 DIAGNOSIS — Z741 Need for assistance with personal care: Secondary | ICD-10-CM | POA: Diagnosis not present

## 2018-01-11 DIAGNOSIS — N390 Urinary tract infection, site not specified: Secondary | ICD-10-CM | POA: Diagnosis not present

## 2018-01-14 ENCOUNTER — Encounter: Payer: Self-pay | Admitting: Podiatry

## 2018-01-14 ENCOUNTER — Ambulatory Visit (INDEPENDENT_AMBULATORY_CARE_PROVIDER_SITE_OTHER): Payer: Medicare Other | Admitting: Podiatry

## 2018-01-14 DIAGNOSIS — B351 Tinea unguium: Secondary | ICD-10-CM

## 2018-01-14 DIAGNOSIS — M79609 Pain in unspecified limb: Secondary | ICD-10-CM | POA: Diagnosis not present

## 2018-01-14 NOTE — Progress Notes (Signed)
Complaint:  Visit Type: Patient returns to my office for continued preventative foot care services. Complaint: Patient states" my nails have grown long and thick and become painful to walk and wear shoes"  The patient presents for preventative foot care services. No changes to ROS.  Patient asks me to look to see if there is corn on second toe right foot.  Podiatric Exam: Vascular: dorsalis pedis and posterior tibial pulses are palpable bilateral. Capillary return is immediate. Temperature gradient is WNL. Skin turgor WNL  Sensorium: Normal Semmes Weinstein monofilament test. Normal tactile sensation bilaterally. Nail Exam: Pt has thick disfigured discolored nails with subungual debris noted bilateral entire nail hallux through fifth toenails Ulcer Exam: There is no evidence of ulcer or pre-ulcerative changes or infection. Orthopedic Exam: Muscle tone and strength are WNL. No limitations in general ROM. No crepitus or effusions noted. Hammer toes  2  B/L. Marland Kitchen Bony prominences are unremarkable. Skin: No Porokeratosis. No infection or ulcers  Diagnosis:  Onychomycosis, , Pain in right toe, pain in left toes  Treatment & Plan Procedures and Treatment: Consent by patient was obtained for treatment procedures.   Debridement of mycotic and hypertrophic toenails, 1 through 5 bilateral and clearing of subungual debris. No ulceration, no infection noted.  Return Visit-Office Procedure: Patient instructed to return to the office for a follow up visit 3 months for continued evaluation and treatment.    Gardiner Barefoot DPM

## 2018-01-15 DIAGNOSIS — M6281 Muscle weakness (generalized): Secondary | ICD-10-CM | POA: Diagnosis not present

## 2018-01-15 DIAGNOSIS — K1121 Acute sialoadenitis: Secondary | ICD-10-CM | POA: Diagnosis not present

## 2018-01-15 DIAGNOSIS — N390 Urinary tract infection, site not specified: Secondary | ICD-10-CM | POA: Diagnosis not present

## 2018-01-15 DIAGNOSIS — Z741 Need for assistance with personal care: Secondary | ICD-10-CM | POA: Diagnosis not present

## 2018-01-16 DIAGNOSIS — M6281 Muscle weakness (generalized): Secondary | ICD-10-CM | POA: Diagnosis not present

## 2018-01-16 DIAGNOSIS — Z741 Need for assistance with personal care: Secondary | ICD-10-CM | POA: Diagnosis not present

## 2018-01-16 DIAGNOSIS — N390 Urinary tract infection, site not specified: Secondary | ICD-10-CM | POA: Diagnosis not present

## 2018-01-17 DIAGNOSIS — Z741 Need for assistance with personal care: Secondary | ICD-10-CM | POA: Diagnosis not present

## 2018-01-17 DIAGNOSIS — M6281 Muscle weakness (generalized): Secondary | ICD-10-CM | POA: Diagnosis not present

## 2018-01-22 DIAGNOSIS — Z741 Need for assistance with personal care: Secondary | ICD-10-CM | POA: Diagnosis not present

## 2018-01-22 DIAGNOSIS — M6281 Muscle weakness (generalized): Secondary | ICD-10-CM | POA: Diagnosis not present

## 2018-01-23 DIAGNOSIS — Z741 Need for assistance with personal care: Secondary | ICD-10-CM | POA: Diagnosis not present

## 2018-01-23 DIAGNOSIS — M6281 Muscle weakness (generalized): Secondary | ICD-10-CM | POA: Diagnosis not present

## 2018-01-25 DIAGNOSIS — M6281 Muscle weakness (generalized): Secondary | ICD-10-CM | POA: Diagnosis not present

## 2018-01-25 DIAGNOSIS — Z741 Need for assistance with personal care: Secondary | ICD-10-CM | POA: Diagnosis not present

## 2018-01-29 DIAGNOSIS — M6281 Muscle weakness (generalized): Secondary | ICD-10-CM | POA: Diagnosis not present

## 2018-01-29 DIAGNOSIS — Z741 Need for assistance with personal care: Secondary | ICD-10-CM | POA: Diagnosis not present

## 2018-01-30 ENCOUNTER — Other Ambulatory Visit: Payer: Self-pay | Admitting: Unknown Physician Specialty

## 2018-01-30 DIAGNOSIS — D37030 Neoplasm of uncertain behavior of the parotid salivary glands: Secondary | ICD-10-CM | POA: Diagnosis not present

## 2018-01-30 DIAGNOSIS — D110 Benign neoplasm of parotid gland: Secondary | ICD-10-CM

## 2018-01-31 DIAGNOSIS — M6281 Muscle weakness (generalized): Secondary | ICD-10-CM | POA: Diagnosis not present

## 2018-01-31 DIAGNOSIS — Z741 Need for assistance with personal care: Secondary | ICD-10-CM | POA: Diagnosis not present

## 2018-02-06 ENCOUNTER — Ambulatory Visit
Admission: RE | Admit: 2018-02-06 | Discharge: 2018-02-06 | Disposition: A | Discharge status: Home / Self Care | Payer: Medicare Other | Source: Ambulatory Visit | Attending: Unknown Physician Specialty | Admitting: Unknown Physician Specialty

## 2018-02-06 DIAGNOSIS — R221 Localized swelling, mass and lump, neck: Secondary | ICD-10-CM | POA: Diagnosis not present

## 2018-02-06 DIAGNOSIS — D110 Benign neoplasm of parotid gland: Secondary | ICD-10-CM | POA: Diagnosis not present

## 2018-02-06 LAB — POCT I-STAT CREATININE: Creatinine, Ser: 0.9 mg/dL (ref 0.44–1.00)

## 2018-02-06 MED ORDER — IOPAMIDOL (ISOVUE-300) INJECTION 61%
75.0000 mL | Freq: Once | INTRAVENOUS | Status: AC | PRN
  Administered 2018-02-06: 75 mL via INTRAVENOUS

## 2018-02-11 ENCOUNTER — Other Ambulatory Visit: Payer: Self-pay | Admitting: Unknown Physician Specialty

## 2018-02-11 DIAGNOSIS — K118 Other diseases of salivary glands: Secondary | ICD-10-CM

## 2018-02-15 DIAGNOSIS — M5136 Other intervertebral disc degeneration, lumbar region: Secondary | ICD-10-CM | POA: Diagnosis not present

## 2018-02-15 DIAGNOSIS — M5416 Radiculopathy, lumbar region: Secondary | ICD-10-CM | POA: Diagnosis not present

## 2018-02-15 DIAGNOSIS — M48062 Spinal stenosis, lumbar region with neurogenic claudication: Secondary | ICD-10-CM | POA: Diagnosis not present

## 2018-02-20 ENCOUNTER — Other Ambulatory Visit: Payer: Self-pay | Admitting: Student

## 2018-02-20 ENCOUNTER — Other Ambulatory Visit: Payer: Self-pay | Admitting: Radiology

## 2018-02-21 ENCOUNTER — Ambulatory Visit
Admission: RE | Admit: 2018-02-21 | Discharge: 2018-02-21 | Disposition: A | Discharge status: Home / Self Care | Payer: Medicare Other | Source: Ambulatory Visit | Attending: Unknown Physician Specialty | Admitting: Unknown Physician Specialty

## 2018-02-21 DIAGNOSIS — K119 Disease of salivary gland, unspecified: Secondary | ICD-10-CM | POA: Diagnosis not present

## 2018-02-21 DIAGNOSIS — C07 Malignant neoplasm of parotid gland: Secondary | ICD-10-CM | POA: Diagnosis not present

## 2018-02-21 DIAGNOSIS — K118 Other diseases of salivary glands: Secondary | ICD-10-CM | POA: Diagnosis not present

## 2018-02-21 HISTORY — DX: Sleep apnea, unspecified: G47.30

## 2018-02-21 HISTORY — DX: Dyspnea, unspecified: R06.00

## 2018-02-21 LAB — CBC
HCT: 34.6 % — ABNORMAL LOW (ref 35.0–47.0)
HEMOGLOBIN: 11.6 g/dL — AB (ref 12.0–16.0)
MCH: 29.7 pg (ref 26.0–34.0)
MCHC: 33.5 g/dL (ref 32.0–36.0)
MCV: 88.6 fL (ref 80.0–100.0)
Platelets: 302 10*3/uL (ref 150–440)
RBC: 3.91 MIL/uL (ref 3.80–5.20)
RDW: 15.6 % — ABNORMAL HIGH (ref 11.5–14.5)
WBC: 8.2 10*3/uL (ref 3.6–11.0)

## 2018-02-21 LAB — PROTIME-INR
INR: 1.02
Prothrombin Time: 13.3 seconds (ref 11.4–15.2)

## 2018-02-21 MED ORDER — MIDAZOLAM HCL 5 MG/5ML IJ SOLN
INTRAMUSCULAR | Status: AC | PRN
  Administered 2018-02-21 (×3): 0.5 mg via INTRAVENOUS

## 2018-02-21 MED ORDER — FENTANYL CITRATE (PF) 100 MCG/2ML IJ SOLN
INTRAMUSCULAR | Status: AC
  Filled 2018-02-21: qty 4

## 2018-02-21 MED ORDER — MIDAZOLAM HCL 5 MG/5ML IJ SOLN
INTRAMUSCULAR | Status: AC
  Filled 2018-02-21: qty 5

## 2018-02-21 MED ORDER — FENTANYL CITRATE (PF) 100 MCG/2ML IJ SOLN
INTRAMUSCULAR | Status: AC | PRN
  Administered 2018-02-21: 25 ug via INTRAVENOUS
  Administered 2018-02-21: 12.5 ug via INTRAVENOUS
  Administered 2018-02-21: 25 ug via INTRAVENOUS

## 2018-02-21 MED ORDER — SODIUM CHLORIDE 0.9 % IV SOLN
INTRAVENOUS | Status: DC
  Administered 2018-02-21: 11:00:00 via INTRAVENOUS

## 2018-02-21 NOTE — Procedures (Signed)
Pre Procedure Dx: Indeterminate right parotid gland nodule Post Procedural Dx: Same  Technically successful US guided biopsy of indeterminate right parotid gland nodule. Approximately 1.5 cc of purulent fluid aspirated and sent to lab for analysis.  EBL: None  No immediate complications.   Ronny Bacon, MD Pager #: [phone]

## 2018-02-21 NOTE — Consult Note (Signed)
Chief Complaint: Indeterminate right-sided parotid nodule  Referring Physician(s): McQueen,Chapman  Patient Status: [facility] - Out-pt  History of Present Illness: Chelsea [name] is a 84 y.o. female with past medical history significant for hypertension, hyperlipidemia, stroke, CAD (post CABG), carotid artery disease (post carotid endarterectomy) and history of smoking who presents today for ultrasound-guided biopsy of indeterminate lesion within the anterior aspect of the right parotid gland.  The patient is unaccompanied and serves as her own historian.  She continues to experience right-sided facial pain as well as right ear pain which she attributes to the palpable right parotid nodule.  She admits to her baseline level of shortness of breath.  She is otherwise without complaint.  Specifically, no chest pain, fever or chills.  Past Medical History:  Diagnosis Date  . Asthma   . Bradycardia    hx of it and fatigue with beta blockade  . Carotid artery disease ([place])    s/p carotid endarterectomy  . Coronary artery disease    a. s/p 2 vessel CABG 1989; b. echo 2003: nl; c. cath 2006: patent grafts, EF 65%; d. nuclear stress test 2007: no ischemia, EF 81%  . Degenerative disc disease   . Dyspnea   . History of hyperkalemia   . Hyperlipidemia   . Hypertension   . Mitral valve prolapse   . Norovirus 2014  . Osteoporosis   . Sleep apnea   . Spinal stenosis in cervical region   . Stroke [facility]) 1980s   left brain    Past Surgical History:  Procedure Laterality Date  . ABDOMINAL HYSTERECTOMY    . BLADDER SUSPENSION    . CAROTID ENDARTERECTOMY     left  . CHOLECYSTECTOMY    . CORONARY ARTERY BYPASS GRAFT    . HIP SURGERY  02/2011   right    Allergies: Sulfa antibiotics and Sulfonamide derivatives  Medications: Prior to Admission medications   Medication Sig Start Date End Date Taking? Authorizing Provider  amLODipine (NORVASC) 5 MG tablet Take 2 tablets (10 mg  total) by mouth daily. 12/06/17  Yes Mar Daring, PA-C  atorvastatin (LIPITOR) 80 MG tablet Take 1 tablet (80 mg total) by mouth daily. 03/15/17  Yes Gollan, Kathlene November, MD  Calcium Carbonate-Vit D-Min (CALCIUM 600+D PLUS MINERALS) 600-400 MG-UNIT TABS Take by mouth.   Yes [provider]  Cholecalciferol (VITAMIN D3) 10000 units TABS Take by mouth.   Yes [provider]  fluticasone furoate-vilanterol (BREO ELLIPTA) 100-25 MCG/INH AEPB Inhale 1 puff into the lungs daily.   Yes [provider]  HYDROcodone-acetaminophen (NORCO/VICODIN) 5-325 MG tablet  11/10/17  Yes [provider]  metaxalone (SKELAXIN) 800 MG tablet Take 1 tablet (800 mg total) by mouth 3 (three) times daily. 12/31/17  Yes Mar Daring, PA-C  Multiple Vitamins-Minerals (PRESERVISION AREDS PO) Take 1 capsule by mouth daily.    Yes [provider]  polyethylene glycol powder (GLYCOLAX/MIRALAX) powder 1 cap full in a full glass of water, two times a day for 3 days. 10/07/17  Yes Carrie Mew, MD  tiotropium (SPIRIVA HANDIHALER) 18 MCG inhalation capsule Place 1 capsule (18 mcg total) into inhaler and inhale daily. 03/19/15  Yes Margarita Rana, MD  traMADol (ULTRAM) 50 MG tablet Take 50 mg by mouth every 6 (six) hours as needed.   Yes [provider]  vitamin C (ASCORBIC ACID) 500 MG tablet Take 500 mg by mouth daily.   Yes [provider]  LORazepam (ATIVAN) 0.5 MG tablet  Take 0.5 mg by mouth at bedtime as needed for anxiety.    [provider]  nitroGLYCERIN (NITROSTAT) 0.4 MG SL tablet Place 1 tablet (0.4 mg total) under the tongue every 5 (five) minutes as needed. Patient not taking: Reported on 12/31/2017 02/07/16   Minna Merritts, MD  NONFORMULARY OR COMPOUNDED ITEM [facility]  Peripheral Neuropathy Cream- Bupivacaine 1%, Doxepin 3%, Gabapentin 6%, Pentoxifylline 3%, Topiramate 1% Apply 1-2 grams to affected area 3-4 times daily Qty.  120 gm 3 refills    [provider]     Family History  Problem Relation Age of Onset  . Throat cancer Mother   . Stroke Father     Social History   Socioeconomic History  . Marital status: Widowed    Spouse name: Not on file  . Number of children: 1  . Years of education: Not on file  . Highest education level: Some college, no degree  Occupational History  . Occupation: retired  Scientific laboratory technician  . Financial resource strain: Not hard at all  . Food insecurity:    Worry: Never true    Inability: Never true  . Transportation needs:    Medical: No    Non-medical: No  Tobacco Use  . Smoking status: Former Smoker    Last attempt to quit: 02/22/1979    Years since quitting: 39.0  . Smokeless tobacco: Never Used  . Tobacco comment: quit 50 years ago  Substance and Sexual Activity  . Alcohol use: No  . Drug use: No  . Sexual activity: Never  Lifestyle  . Physical activity:    Days per week: Not on file    Minutes per session: Not on file  . Stress: Not at all  Relationships  . Social connections:    Talks on phone: Not on file    Gets together: Not on file    Attends religious service: Not on file    Active member of club or organization: Not on file    Attends meetings of clubs or organizations: Not on file    Relationship status: Not on file  Other Topics Concern  . Not on file  Social History Narrative  . Not on file    ECOG Status: 1 - Symptomatic but completely ambulatory  Review of Systems: A 12 point ROS discussed and pertinent positives are indicated in the HPI above.  All other systems are negative.  Review of Systems  Constitutional: Negative for activity change, appetite change, fatigue and fever.  HENT: Positive for ear pain.   Respiratory: Positive for shortness of breath.   Cardiovascular: Negative.     Vital Signs: BP (!) 152/66   Pulse 71   Temp 97.7 F (36.5 C)   Resp 18   Ht 5\' 5"  (1.651 m)   Wt 58.1 kg   SpO2 98%   BMI  21.30 kg/m   Physical Exam  Constitutional: She appears well-developed and well-nourished.  Cardiovascular: Normal rate and regular rhythm.  Pulmonary/Chest: Effort normal and breath sounds normal.  Skin: Skin is warm and dry.  Psychiatric: She has a normal mood and affect. Her behavior is normal.  Nursing note reviewed.   Imaging: Ct Soft Tissue Neck W Contrast  Result Date: 02/06/2018 CLINICAL DATA:  84 y/o F; right posterior jaw swelling and pain intermittently for 1 month. History of carotid endarterectomy. EXAM: CT NECK WITH CONTRAST TECHNIQUE: Multidetector CT imaging of the neck was performed using the standard protocol following the bolus administration  of intravenous contrast. CONTRAST:  14mL ISOVUE-300 IOPAMIDOL (ISOVUE-300) INJECTION 61% COMPARISON:  None. FINDINGS: Pharynx and larynx: Normal. No mass or swelling. Salivary glands: Thick rim enhancing lesion within the superficial lobe of the right parotid gland measuring 17 x 16 x 18 mm (AP x ML x CC series 2, image 19 and series 5, image 65) subjacent to the vitamin E capsule marker for abnormality. Additional salivary glands are unremarkable. Thyroid: Normal. Lymph nodes: None enlarged or abnormal density. Vascular: Calcific atherosclerosis of the right carotid bifurcation moderate to severe proximal ICA stenosis. Postsurgical changes are present adjacent to the left carotid artery which is widely patent. Limited intracranial: Negative. Visualized orbits: Negative. Mastoids and visualized paranasal sinuses: Clear. Skeleton: Chronic right posterior rib fractures partially visualized sternotomy postsurgical changes. Nonunion subacute to chronic fracture of the right medial clavicle at the sternoclavicular junction. Mild cervical spondylosis. No high-grade bony canal stenosis. Upper chest: Scattered mucous plugging within the lung apices. Other: None. IMPRESSION: Right superficial parotid space mass with thick rim enhancement measuring up to  18 mm. No significant surrounding edema. No additional neck mass or lymphadenopathy. Differential includes parotid salivary neoplasm, abscess, metastasis, and atypical infection. Electronically Signed   By: Kristine Garbe M.D.   On: 02/06/2018 13:51    Labs:  CBC: Recent Labs    09/25/17 0328 09/27/17 0641 10/07/17 1520 02/21/18 1028  WBC 13.3* 6.5 6.3 8.2  HGB 9.0* 9.3* 11.5* 11.6*  HCT 28.0* 27.4* 34.6* 34.6*  PLT 246 258 458* 302    COAGS: Recent Labs    09/24/17 1825 02/21/18 1028  INR 0.96 1.02    BMP: Recent Labs    09/24/17 1825 09/24/17 2320 09/25/17 0328 09/27/17 0641 10/07/17 1520 02/06/18 1309  NA 135  --  133* 135 137  --   K 4.7  --  4.0 4.2 4.7  --   CL 103  --  105 107 105  --   CO2 21*  --  21* 23 21*  --   GLUCOSE 129*  --  123* 106* 110*  --   BUN 30*  --  28* 18 21*  --   CALCIUM 8.7*  --  7.7* 7.8* 9.0  --   CREATININE 1.00 1.00 0.93 0.84 0.93 0.90  GFRNONAA 48* 48* 52* 59* 52*  --   GFRAA 55* 55* >60 >60 >60  --     LIVER FUNCTION TESTS: Recent Labs    09/24/17 1825 10/07/17 1520  BILITOT 0.7 0.5  AST 27 35  ALT 10* 16  ALKPHOS 132* 97  PROT 6.8 7.1  ALBUMIN 3.4* 3.8    TUMOR MARKERS: No results for input(s): AFPTM, CEA, CA199, CHROMGRNA in the last 8760 hours.  Assessment and Plan:  TASHIYA SOUDERS is a 84 y.o. female with past medical history significant for hypertension, hyperlipidemia, stroke, CAD (post CABG), carotid artery disease (post carotid endarterectomy) and history of smoking who presents today for ultrasound-guided biopsy of indeterminate lesion within the anterior aspect of the right parotid gland.    She continues to experience right-sided facial pain as well as right ear pain which she attributes to the palpable right parotid nodule.  Risks and benefits of ultrasound-guided right product nodule biopsy was discussed with the patient including, but not limited to bleeding, infection, damage to  adjacent structures or low yield requiring additional tests.  All of the patient's questions were answered, patient is agreeable to proceed. Consent signed and in chart.  Thank you for this  interesting consult.  I greatly enjoyed meeting TASHEMA TILLER and look forward to participating in their care.  A copy of this report was sent to the requesting provider on this date.  Electronically Signed: Sandi Mariscal, MD 02/21/2018, 11:19 AM   I spent a total of 15 Minutes in face to face in clinical consultation, greater than 50% of which was counseling/coordinating care for US guided right parotid nodule biopsy.

## 2018-02-22 ENCOUNTER — Other Ambulatory Visit: Payer: Self-pay | Admitting: Anatomic Pathology & Clinical Pathology

## 2018-02-22 LAB — SURGICAL PATHOLOGY

## 2018-02-22 LAB — CYTOLOGY - NON PAP

## 2018-02-26 ENCOUNTER — Other Ambulatory Visit: Payer: Self-pay | Admitting: Unknown Physician Specialty

## 2018-02-26 DIAGNOSIS — C07 Malignant neoplasm of parotid gland: Secondary | ICD-10-CM | POA: Diagnosis not present

## 2018-02-26 LAB — AEROBIC/ANAEROBIC CULTURE (SURGICAL/DEEP WOUND): CULTURE: NO GROWTH

## 2018-02-26 LAB — AEROBIC/ANAEROBIC CULTURE W GRAM STAIN (SURGICAL/DEEP WOUND): Special Requests: NORMAL

## 2018-03-12 DIAGNOSIS — R0609 Other forms of dyspnea: Secondary | ICD-10-CM | POA: Diagnosis not present

## 2018-03-12 DIAGNOSIS — J449 Chronic obstructive pulmonary disease, unspecified: Secondary | ICD-10-CM | POA: Diagnosis not present

## 2018-03-12 DIAGNOSIS — J849 Interstitial pulmonary disease, unspecified: Secondary | ICD-10-CM | POA: Diagnosis not present

## 2018-03-13 ENCOUNTER — Ambulatory Visit
Admission: RE | Admit: 2018-03-13 | Discharge: 2018-03-13 | Disposition: A | Discharge status: Home / Self Care | Payer: Medicare Other | Source: Ambulatory Visit | Attending: Radiation Oncology | Admitting: Radiation Oncology

## 2018-03-13 ENCOUNTER — Other Ambulatory Visit: Payer: Self-pay

## 2018-03-13 ENCOUNTER — Other Ambulatory Visit: Payer: Self-pay | Admitting: *Deleted

## 2018-03-13 ENCOUNTER — Encounter: Payer: Self-pay | Admitting: Radiation Oncology

## 2018-03-13 DIAGNOSIS — M4802 Spinal stenosis, cervical region: Secondary | ICD-10-CM | POA: Diagnosis not present

## 2018-03-13 DIAGNOSIS — R001 Bradycardia, unspecified: Secondary | ICD-10-CM | POA: Insufficient documentation

## 2018-03-13 DIAGNOSIS — G4730 Sleep apnea, unspecified: Secondary | ICD-10-CM | POA: Insufficient documentation

## 2018-03-13 DIAGNOSIS — I341 Nonrheumatic mitral (valve) prolapse: Secondary | ICD-10-CM | POA: Diagnosis not present

## 2018-03-13 DIAGNOSIS — E785 Hyperlipidemia, unspecified: Secondary | ICD-10-CM | POA: Diagnosis not present

## 2018-03-13 DIAGNOSIS — Z8673 Personal history of transient ischemic attack (TIA), and cerebral infarction without residual deficits: Secondary | ICD-10-CM | POA: Insufficient documentation

## 2018-03-13 DIAGNOSIS — C07 Malignant neoplasm of parotid gland: Secondary | ICD-10-CM

## 2018-03-13 DIAGNOSIS — I2510 Atherosclerotic heart disease of native coronary artery without angina pectoris: Secondary | ICD-10-CM | POA: Diagnosis not present

## 2018-03-13 DIAGNOSIS — G893 Neoplasm related pain (acute) (chronic): Secondary | ICD-10-CM | POA: Insufficient documentation

## 2018-03-13 DIAGNOSIS — M810 Age-related osteoporosis without current pathological fracture: Secondary | ICD-10-CM | POA: Diagnosis not present

## 2018-03-13 DIAGNOSIS — Z79899 Other long term (current) drug therapy: Secondary | ICD-10-CM | POA: Diagnosis not present

## 2018-03-13 DIAGNOSIS — J45909 Unspecified asthma, uncomplicated: Secondary | ICD-10-CM | POA: Diagnosis not present

## 2018-03-13 DIAGNOSIS — D490 Neoplasm of unspecified behavior of digestive system: Secondary | ICD-10-CM

## 2018-03-13 DIAGNOSIS — I10 Essential (primary) hypertension: Secondary | ICD-10-CM | POA: Insufficient documentation

## 2018-03-13 DIAGNOSIS — Z87891 Personal history of nicotine dependence: Secondary | ICD-10-CM | POA: Diagnosis not present

## 2018-03-13 NOTE — Consult Note (Signed)
NEW PATIENT EVALUATION  Name: Chelsea [name]  MRN: [id number]  Date:   03/13/2018     DOB: [birth year]   This 84 y.o. 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  REFERRING PHYSICIAN: Mar Daring, [place]*  CHIEF COMPLAINT:  Chief Complaint  Patient presents with  . 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   PREVIOUS INVESTIGATIONS:  CT scans reviewed PET CT scan ordered Pathology report reviewed Clinical notes reviewed  HPI: patient is a 84 year old female who presented with a slow-growing mass in the right parotid bed now causing right ear pain as well as facial discomfort. She has a past medical history significant for hypertension prior CVA and status post CABG.cT scan of the head and neck region showed a right superficial parotid space mass with rim enhancement She underwent a ultrasound-guided biopsyshowing malignant neoplasm with necrosis and hemorrhage. Her case was presented at our weekly tumor conference and surgical option was declined recognition was for radiation therapy. She is seen today still having significant facial pain and right ear pain. She specifically denies any dysphagia.  PLANNED TREATMENT REGIMEN: I MRT radiation therapy  PAST MEDICAL HISTORY:  has a past medical history of Asthma, Bradycardia, Carotid artery disease ([place]), Coronary artery disease, Degenerative disc disease, Dyspnea, History of hyperkalemia, Hyperlipidemia, Hypertension, Mitral valve prolapse, Norovirus (2014), Osteoporosis, Sleep apnea, Spinal stenosis in cervical region, and Stroke ([place]) (1980s).    PAST SURGICAL HISTORY:  Past Surgical History:  Procedure Laterality Date  . ABDOMINAL HYSTERECTOMY    . BLADDER SUSPENSION    . CAROTID ENDARTERECTOMY     left  . CHOLECYSTECTOMY    . CORONARY ARTERY BYPASS GRAFT    . HIP SURGERY  02/2011   right    FAMILY HISTORY:  family history includes Stroke in her father; Throat cancer in her mother.  SOCIAL HISTORY:  reports that she quit smoking about 39 years ago. She has never used smokeless tobacco. She reports that she does not drink alcohol or use drugs.  ALLERGIES: Sulfa antibiotics and Sulfonamide derivatives  MEDICATIONS:  Current Outpatient Medications  Medication Sig Dispense Refill  . amLODipine (NORVASC) 5 MG tablet Take 2 tablets (10 mg total) by mouth daily. 90 tablet 3  . atorvastatin (LIPITOR) 80 MG tablet Take 1 tablet (80 mg total) by mouth daily. 90 tablet 3  . Calcium Carbonate-Vit D-Min (CALCIUM 600+D PLUS MINERALS) 600-400 MG-UNIT TABS Take by mouth.    . Cholecalciferol (VITAMIN D3) 10000 units TABS Take by mouth.    . fluticasone furoate-vilanterol (BREO ELLIPTA) 100-25 MCG/INH AEPB Inhale 1 puff into the lungs daily.    Marland Kitchen HYDROcodone-acetaminophen (NORCO/VICODIN) 5-325 MG tablet     . LORazepam (ATIVAN) 0.5 MG tablet Take 0.5 mg by mouth at bedtime as needed for anxiety.    . metaxalone (SKELAXIN) 800 MG tablet Take 1 tablet (800 mg total) by mouth 3 (three) times daily. 90 tablet 1  . Multiple Vitamins-Minerals (PRESERVISION AREDS PO) Take 1 capsule by mouth daily.     . nitroGLYCERIN (NITROSTAT) 0.4 MG SL tablet Place 1 tablet (0.4 mg total) under the tongue every 5 (five) minutes as needed. (Patient not taking: Reported on 12/31/2017) 25 tablet 6  . NONFORMULARY OR COMPOUNDED ITEM [facility]  Peripheral Neuropathy Cream- Bupivacaine 1%, Doxepin 3%, Gabapentin 6%, Pentoxifylline 3%, Topiramate 1% Apply 1-2 grams to affected area 3-4 times daily Qty.  120 gm 3 refills    . polyethylene glycol powder (GLYCOLAX/MIRALAX) powder 1 cap full in a full glass of water, two times a day for 3 days. 255 g 0  . tiotropium (SPIRIVA HANDIHALER) 18 MCG inhalation capsule Place 1 capsule (18 mcg total) into inhaler and inhale daily. 90 capsule 0  . traMADol (ULTRAM) 50 MG tablet Take 50 mg by  mouth every 6 (six) hours as needed.    . vitamin C (ASCORBIC ACID) 500 MG tablet Take 500 mg by mouth daily.     No current facility-administered medications for this encounter.     ECOG PERFORMANCE STATUS:  1 - Symptomatic but completely ambulatory  REVIEW OF SYSTEMS:  Patient denies any weight loss, fatigue, weakness, fever, chills or night sweats. Patient denies any loss of vision, blurred vision. Patient denies any ringing  of the ears or hearing loss. No irregular heartbeat. Patient denies heart murmur or history of fainting. Patient denies any chest pain or pain radiating to her upper extremities. Patient denies any shortness of breath, difficulty breathing at night, cough or hemoptysis. Patient denies any swelling in the lower legs. Patient denies any nausea vomiting, vomiting of blood, or coffee ground material in the vomitus. Patient denies any stomach pain. Patient states has had normal bowel movements no significant constipation or diarrhea. Patient denies any dysuria, hematuria or significant nocturia. Patient denies any problems walking, swelling in the joints or loss of balance. Patient denies any skin changes, loss of hair or loss of weight. Patient denies any excessive worrying or anxiety or significant depression. Patient denies any problems with insomnia. Patient denies excessive thirst, polyuria, polydipsia. Patient denies any swollen glands, patient denies easy bruising or easy bleeding. Patient denies any recent infections, allergies or URI. Patient "s visual fields have not changed significantly in recent time.    PHYSICAL EXAM: BP (!) (P) 143/75 (BP Location: Left Arm, Patient Position: Sitting)   Pulse (P) 82   Temp (P) 97.8 F (36.6 C) (Tympanic)   Wt (P) 121 lb 9.3 oz (55.2 kg)   BMI (P) 20.23 kg/m  Oral cavity is clear no oral mucosal lesions are identified indirect mirror examination shows upper airway clear vallecula and base of tongue within normal limits she is a  palpable mass in her right parotid bed. No evidence of subject gastric cervical or supraclavicular adenopathy is noted.Well-developed well-nourished patient in NAD. HEENT reveals PERLA, EOMI, discs not visualized.  Oral cavity is clear. No oral mucosal lesions are identified. Neck is clear without evidence of cervical or supraclavicular adenopathy. Lungs are clear to A&P. Cardiac examination is essentially unremarkable with regular rate and rhythm without murmur rub or thrill. Abdomen is benign with no organomegaly or masses noted. Motor sensory and DTR levels are equal and symmetric in the upper and lower extremities. Cranial nerves II through XII are grossly intact. Proprioception is intact. No peripheral adenopathy or edema is identified. No motor or sensory levels are noted. Crude visual fields are within normal range.  LABORATORY DATA: pathology reports reviewed    RADIOLOGY RESULTS:cT scan reviewed PET CT scan ordered   IMPRESSION: malignant neoplasm of right parotid gland in 84 year old female  PLAN: this time of ordered a PET CT scan to better delineate the extent of her disease as well as any possibility of neck adenopathy.I would plan on delivering 7000 cGy to her parotid lesion as well as possible right cervical nodes depending on PET/CT findings.Risks and benefits of treatment using I MRT treatment  planning and delivery were discussed. Possible slight dysphasia skin reaction fatigue possible quality taste changeall were discussed in detail. Patient and her accompanying friend both seem to comprehend my treatment plan well. I've also personally talked to her daughter in [place]. I have set up CT simulation after her PET CT scan has been performed and will review those results at that time.  I would like to take this opportunity to thank you for allowing me to participate in the care of your patient.Noreene Filbert, MD

## 2018-03-19 ENCOUNTER — Ambulatory Visit
Admission: RE | Admit: 2018-03-19 | Discharge: 2018-03-19 | Disposition: A | Discharge status: Home / Self Care | Payer: Medicare Other | Source: Ambulatory Visit | Attending: Radiation Oncology | Admitting: Radiation Oncology

## 2018-03-19 DIAGNOSIS — I700 Atherosclerosis of aorta: Secondary | ICD-10-CM | POA: Diagnosis not present

## 2018-03-19 DIAGNOSIS — D490 Neoplasm of unspecified behavior of digestive system: Secondary | ICD-10-CM

## 2018-03-19 DIAGNOSIS — K449 Diaphragmatic hernia without obstruction or gangrene: Secondary | ICD-10-CM | POA: Diagnosis not present

## 2018-03-19 DIAGNOSIS — D110 Benign neoplasm of parotid gland: Secondary | ICD-10-CM | POA: Diagnosis not present

## 2018-03-19 DIAGNOSIS — K118 Other diseases of salivary glands: Secondary | ICD-10-CM | POA: Insufficient documentation

## 2018-03-19 LAB — GLUCOSE, CAPILLARY: Glucose-Capillary: 93 mg/dL (ref 70–99)

## 2018-03-19 MED ORDER — FLUDEOXYGLUCOSE F - 18 (FDG) INJECTION
6.3000 | Freq: Once | INTRAVENOUS | Status: AC | PRN
  Administered 2018-03-19: 5.78 via INTRAVENOUS

## 2018-03-21 ENCOUNTER — Ambulatory Visit
Admission: RE | Admit: 2018-03-21 | Discharge: 2018-03-21 | Disposition: A | Discharge status: Home / Self Care | Payer: Medicare Other | Source: Ambulatory Visit | Attending: Radiation Oncology | Admitting: Radiation Oncology

## 2018-03-21 DIAGNOSIS — Z510 Encounter for antineoplastic radiation therapy: Secondary | ICD-10-CM | POA: Diagnosis not present

## 2018-03-21 DIAGNOSIS — C07 Malignant neoplasm of parotid gland: Secondary | ICD-10-CM | POA: Diagnosis not present

## 2018-03-21 DIAGNOSIS — Z87891 Personal history of nicotine dependence: Secondary | ICD-10-CM | POA: Diagnosis not present

## 2018-03-25 DIAGNOSIS — Z510 Encounter for antineoplastic radiation therapy: Secondary | ICD-10-CM | POA: Diagnosis not present

## 2018-03-25 DIAGNOSIS — Z87891 Personal history of nicotine dependence: Secondary | ICD-10-CM | POA: Diagnosis not present

## 2018-03-25 DIAGNOSIS — C07 Malignant neoplasm of parotid gland: Secondary | ICD-10-CM | POA: Diagnosis not present

## 2018-03-29 ENCOUNTER — Other Ambulatory Visit: Payer: Self-pay | Admitting: Unknown Physician Specialty

## 2018-03-29 DIAGNOSIS — K118 Other diseases of salivary glands: Secondary | ICD-10-CM

## 2018-03-29 NOTE — Addendum Note (Signed)
Encounter addended by: Drucie Opitz, RDMS on: 03/29/2018 2:29 PM  Actions taken: Imaging Exam ended

## 2018-03-29 NOTE — Addendum Note (Signed)
Encounter addended by: Drucie Opitz, RDMS on: 03/29/2018 2:30 PM  Actions taken: Imaging Exam ended

## 2018-04-01 ENCOUNTER — Ambulatory Visit
Admission: RE | Admit: 2018-04-01 | Discharge: 2018-04-01 | Disposition: A | Discharge status: Home / Self Care | Payer: Medicare Other | Source: Ambulatory Visit | Attending: Radiation Oncology | Admitting: Radiation Oncology

## 2018-04-02 ENCOUNTER — Ambulatory Visit
Admission: RE | Admit: 2018-04-02 | Discharge: 2018-04-02 | Disposition: A | Discharge status: Home / Self Care | Payer: Medicare Other | Source: Ambulatory Visit | Attending: Radiation Oncology | Admitting: Radiation Oncology

## 2018-04-02 DIAGNOSIS — Z87891 Personal history of nicotine dependence: Secondary | ICD-10-CM | POA: Diagnosis not present

## 2018-04-02 DIAGNOSIS — C07 Malignant neoplasm of parotid gland: Secondary | ICD-10-CM | POA: Diagnosis not present

## 2018-04-02 DIAGNOSIS — Z510 Encounter for antineoplastic radiation therapy: Secondary | ICD-10-CM | POA: Diagnosis not present

## 2018-04-03 ENCOUNTER — Ambulatory Visit
Admission: RE | Admit: 2018-04-03 | Discharge: 2018-04-03 | Disposition: A | Discharge status: Home / Self Care | Payer: Medicare Other | Source: Ambulatory Visit | Attending: Radiation Oncology | Admitting: Radiation Oncology

## 2018-04-03 DIAGNOSIS — C07 Malignant neoplasm of parotid gland: Secondary | ICD-10-CM | POA: Diagnosis not present

## 2018-04-03 DIAGNOSIS — Z87891 Personal history of nicotine dependence: Secondary | ICD-10-CM | POA: Diagnosis not present

## 2018-04-03 DIAGNOSIS — Z510 Encounter for antineoplastic radiation therapy: Secondary | ICD-10-CM | POA: Diagnosis not present

## 2018-04-04 ENCOUNTER — Ambulatory Visit
Admission: RE | Admit: 2018-04-04 | Discharge: 2018-04-04 | Disposition: A | Discharge status: Home / Self Care | Payer: Medicare Other | Source: Ambulatory Visit | Attending: Radiation Oncology | Admitting: Radiation Oncology

## 2018-04-04 DIAGNOSIS — Z510 Encounter for antineoplastic radiation therapy: Secondary | ICD-10-CM | POA: Diagnosis not present

## 2018-04-04 DIAGNOSIS — Z87891 Personal history of nicotine dependence: Secondary | ICD-10-CM | POA: Diagnosis not present

## 2018-04-04 DIAGNOSIS — C07 Malignant neoplasm of parotid gland: Secondary | ICD-10-CM | POA: Diagnosis not present

## 2018-04-04 DIAGNOSIS — Z23 Encounter for immunization: Secondary | ICD-10-CM | POA: Diagnosis not present

## 2018-04-05 ENCOUNTER — Other Ambulatory Visit: Payer: Self-pay | Admitting: *Deleted

## 2018-04-05 ENCOUNTER — Ambulatory Visit
Admission: RE | Admit: 2018-04-05 | Discharge: 2018-04-05 | Disposition: A | Discharge status: Home / Self Care | Payer: Medicare Other | Source: Ambulatory Visit | Attending: Radiation Oncology | Admitting: Radiation Oncology

## 2018-04-05 DIAGNOSIS — C07 Malignant neoplasm of parotid gland: Secondary | ICD-10-CM | POA: Diagnosis not present

## 2018-04-05 DIAGNOSIS — Z510 Encounter for antineoplastic radiation therapy: Secondary | ICD-10-CM | POA: Diagnosis not present

## 2018-04-05 DIAGNOSIS — Z87891 Personal history of nicotine dependence: Secondary | ICD-10-CM | POA: Diagnosis not present

## 2018-04-08 ENCOUNTER — Ambulatory Visit
Admission: RE | Admit: 2018-04-08 | Discharge: 2018-04-08 | Disposition: A | Discharge status: Home / Self Care | Payer: Medicare Other | Source: Ambulatory Visit | Attending: Radiation Oncology | Admitting: Radiation Oncology

## 2018-04-08 DIAGNOSIS — C07 Malignant neoplasm of parotid gland: Secondary | ICD-10-CM | POA: Diagnosis not present

## 2018-04-08 DIAGNOSIS — Z510 Encounter for antineoplastic radiation therapy: Secondary | ICD-10-CM | POA: Diagnosis not present

## 2018-04-08 DIAGNOSIS — Z87891 Personal history of nicotine dependence: Secondary | ICD-10-CM | POA: Diagnosis not present

## 2018-04-09 ENCOUNTER — Ambulatory Visit
Admission: RE | Admit: 2018-04-09 | Discharge: 2018-04-09 | Disposition: A | Discharge status: Home / Self Care | Payer: Medicare Other | Source: Ambulatory Visit | Attending: Radiation Oncology | Admitting: Radiation Oncology

## 2018-04-09 DIAGNOSIS — Z510 Encounter for antineoplastic radiation therapy: Secondary | ICD-10-CM | POA: Diagnosis not present

## 2018-04-09 DIAGNOSIS — C07 Malignant neoplasm of parotid gland: Secondary | ICD-10-CM | POA: Diagnosis not present

## 2018-04-09 DIAGNOSIS — E559 Vitamin D deficiency, unspecified: Secondary | ICD-10-CM | POA: Diagnosis not present

## 2018-04-09 DIAGNOSIS — E039 Hypothyroidism, unspecified: Secondary | ICD-10-CM | POA: Diagnosis not present

## 2018-04-09 DIAGNOSIS — M810 Age-related osteoporosis without current pathological fracture: Secondary | ICD-10-CM | POA: Diagnosis not present

## 2018-04-09 DIAGNOSIS — Z87891 Personal history of nicotine dependence: Secondary | ICD-10-CM | POA: Diagnosis not present

## 2018-04-10 ENCOUNTER — Ambulatory Visit
Admission: RE | Admit: 2018-04-10 | Discharge: 2018-04-10 | Disposition: A | Discharge status: Home / Self Care | Payer: Medicare Other | Source: Ambulatory Visit | Attending: Radiation Oncology | Admitting: Radiation Oncology

## 2018-04-10 DIAGNOSIS — Z510 Encounter for antineoplastic radiation therapy: Secondary | ICD-10-CM | POA: Diagnosis not present

## 2018-04-10 DIAGNOSIS — Z87891 Personal history of nicotine dependence: Secondary | ICD-10-CM | POA: Diagnosis not present

## 2018-04-10 DIAGNOSIS — C07 Malignant neoplasm of parotid gland: Secondary | ICD-10-CM | POA: Diagnosis not present

## 2018-04-11 ENCOUNTER — Ambulatory Visit
Admission: RE | Admit: 2018-04-11 | Discharge: 2018-04-11 | Disposition: A | Discharge status: Home / Self Care | Payer: Medicare Other | Source: Ambulatory Visit | Attending: Radiation Oncology | Admitting: Radiation Oncology

## 2018-04-11 DIAGNOSIS — C07 Malignant neoplasm of parotid gland: Secondary | ICD-10-CM | POA: Diagnosis not present

## 2018-04-11 DIAGNOSIS — Z87891 Personal history of nicotine dependence: Secondary | ICD-10-CM | POA: Diagnosis not present

## 2018-04-11 DIAGNOSIS — Z510 Encounter for antineoplastic radiation therapy: Secondary | ICD-10-CM | POA: Diagnosis not present

## 2018-04-12 ENCOUNTER — Ambulatory Visit
Admission: RE | Admit: 2018-04-12 | Discharge: 2018-04-12 | Disposition: A | Discharge status: Home / Self Care | Payer: Medicare Other | Source: Ambulatory Visit | Attending: Radiation Oncology | Admitting: Radiation Oncology

## 2018-04-12 DIAGNOSIS — C07 Malignant neoplasm of parotid gland: Secondary | ICD-10-CM | POA: Diagnosis not present

## 2018-04-12 DIAGNOSIS — Z87891 Personal history of nicotine dependence: Secondary | ICD-10-CM | POA: Diagnosis not present

## 2018-04-12 DIAGNOSIS — Z510 Encounter for antineoplastic radiation therapy: Secondary | ICD-10-CM | POA: Diagnosis not present

## 2018-04-15 ENCOUNTER — Ambulatory Visit: Payer: Medicare Other | Admitting: Podiatry

## 2018-04-15 ENCOUNTER — Ambulatory Visit
Admission: RE | Admit: 2018-04-15 | Discharge: 2018-04-15 | Disposition: A | Discharge status: Home / Self Care | Payer: Medicare Other | Source: Ambulatory Visit | Attending: Radiation Oncology | Admitting: Radiation Oncology

## 2018-04-15 DIAGNOSIS — Z510 Encounter for antineoplastic radiation therapy: Secondary | ICD-10-CM | POA: Diagnosis not present

## 2018-04-15 DIAGNOSIS — C07 Malignant neoplasm of parotid gland: Secondary | ICD-10-CM | POA: Diagnosis not present

## 2018-04-15 DIAGNOSIS — Z87891 Personal history of nicotine dependence: Secondary | ICD-10-CM | POA: Diagnosis not present

## 2018-04-16 ENCOUNTER — Ambulatory Visit
Admission: RE | Admit: 2018-04-16 | Discharge: 2018-04-16 | Disposition: A | Discharge status: Home / Self Care | Payer: Medicare Other | Source: Ambulatory Visit | Attending: Radiation Oncology | Admitting: Radiation Oncology

## 2018-04-16 ENCOUNTER — Inpatient Hospital Stay: Payer: Medicare Other | Attending: Radiation Oncology

## 2018-04-16 DIAGNOSIS — C07 Malignant neoplasm of parotid gland: Secondary | ICD-10-CM | POA: Diagnosis not present

## 2018-04-16 DIAGNOSIS — Z87891 Personal history of nicotine dependence: Secondary | ICD-10-CM | POA: Diagnosis not present

## 2018-04-16 DIAGNOSIS — Z510 Encounter for antineoplastic radiation therapy: Secondary | ICD-10-CM | POA: Diagnosis not present

## 2018-04-17 ENCOUNTER — Ambulatory Visit: Payer: Medicare Other

## 2018-04-17 DIAGNOSIS — H353124 Nonexudative age-related macular degeneration, left eye, advanced atrophic with subfoveal involvement: Secondary | ICD-10-CM | POA: Diagnosis not present

## 2018-04-17 DIAGNOSIS — H04123 Dry eye syndrome of bilateral lacrimal glands: Secondary | ICD-10-CM | POA: Diagnosis not present

## 2018-04-18 ENCOUNTER — Ambulatory Visit
Admission: RE | Admit: 2018-04-18 | Discharge: 2018-04-18 | Disposition: A | Discharge status: Home / Self Care | Payer: Medicare Other | Source: Ambulatory Visit | Attending: Radiation Oncology | Admitting: Radiation Oncology

## 2018-04-18 DIAGNOSIS — C07 Malignant neoplasm of parotid gland: Secondary | ICD-10-CM | POA: Diagnosis not present

## 2018-04-18 DIAGNOSIS — Z510 Encounter for antineoplastic radiation therapy: Secondary | ICD-10-CM | POA: Diagnosis not present

## 2018-04-18 DIAGNOSIS — M5136 Other intervertebral disc degeneration, lumbar region: Secondary | ICD-10-CM | POA: Diagnosis not present

## 2018-04-18 DIAGNOSIS — M5416 Radiculopathy, lumbar region: Secondary | ICD-10-CM | POA: Diagnosis not present

## 2018-04-18 DIAGNOSIS — Z87891 Personal history of nicotine dependence: Secondary | ICD-10-CM | POA: Diagnosis not present

## 2018-04-18 DIAGNOSIS — M48062 Spinal stenosis, lumbar region with neurogenic claudication: Secondary | ICD-10-CM | POA: Diagnosis not present

## 2018-04-19 ENCOUNTER — Ambulatory Visit
Admission: RE | Admit: 2018-04-19 | Discharge: 2018-04-19 | Disposition: A | Discharge status: Home / Self Care | Payer: Medicare Other | Source: Ambulatory Visit | Attending: Radiation Oncology | Admitting: Radiation Oncology

## 2018-04-19 DIAGNOSIS — C07 Malignant neoplasm of parotid gland: Secondary | ICD-10-CM | POA: Insufficient documentation

## 2018-04-19 DIAGNOSIS — Z510 Encounter for antineoplastic radiation therapy: Secondary | ICD-10-CM | POA: Insufficient documentation

## 2018-04-19 DIAGNOSIS — Z87891 Personal history of nicotine dependence: Secondary | ICD-10-CM | POA: Diagnosis not present

## 2018-04-22 ENCOUNTER — Ambulatory Visit
Admission: RE | Admit: 2018-04-22 | Discharge: 2018-04-22 | Disposition: A | Discharge status: Home / Self Care | Payer: Medicare Other | Source: Ambulatory Visit | Attending: Radiation Oncology | Admitting: Radiation Oncology

## 2018-04-22 DIAGNOSIS — C07 Malignant neoplasm of parotid gland: Secondary | ICD-10-CM | POA: Diagnosis not present

## 2018-04-22 DIAGNOSIS — Z510 Encounter for antineoplastic radiation therapy: Secondary | ICD-10-CM | POA: Diagnosis not present

## 2018-04-22 DIAGNOSIS — Z87891 Personal history of nicotine dependence: Secondary | ICD-10-CM | POA: Diagnosis not present

## 2018-04-23 ENCOUNTER — Ambulatory Visit
Admission: RE | Admit: 2018-04-23 | Discharge: 2018-04-23 | Disposition: A | Discharge status: Home / Self Care | Payer: Medicare Other | Source: Ambulatory Visit | Attending: Radiation Oncology | Admitting: Radiation Oncology

## 2018-04-23 ENCOUNTER — Inpatient Hospital Stay: Payer: Medicare Other | Attending: Radiation Oncology

## 2018-04-23 DIAGNOSIS — C07 Malignant neoplasm of parotid gland: Secondary | ICD-10-CM | POA: Diagnosis not present

## 2018-04-23 DIAGNOSIS — Z510 Encounter for antineoplastic radiation therapy: Secondary | ICD-10-CM | POA: Diagnosis not present

## 2018-04-23 DIAGNOSIS — Z87891 Personal history of nicotine dependence: Secondary | ICD-10-CM | POA: Diagnosis not present

## 2018-04-23 LAB — CBC
HCT: 32.2 % — ABNORMAL LOW (ref 36.0–46.0)
HEMOGLOBIN: 10.2 g/dL — AB (ref 12.0–15.0)
MCH: 28.7 pg (ref 26.0–34.0)
MCHC: 31.7 g/dL (ref 30.0–36.0)
MCV: 90.4 fL (ref 80.0–100.0)
Platelets: 271 10*3/uL (ref 150–400)
RBC: 3.56 MIL/uL — AB (ref 3.87–5.11)
RDW: 15.4 % (ref 11.5–15.5)
WBC: 6.9 10*3/uL (ref 4.0–10.5)
nRBC: 0 % (ref 0.0–0.2)

## 2018-04-24 ENCOUNTER — Other Ambulatory Visit: Payer: Self-pay | Admitting: *Deleted

## 2018-04-24 ENCOUNTER — Ambulatory Visit
Admission: RE | Admit: 2018-04-24 | Discharge: 2018-04-24 | Disposition: A | Discharge status: Home / Self Care | Payer: Medicare Other | Source: Ambulatory Visit | Attending: Radiation Oncology | Admitting: Radiation Oncology

## 2018-04-24 DIAGNOSIS — C07 Malignant neoplasm of parotid gland: Secondary | ICD-10-CM | POA: Diagnosis not present

## 2018-04-24 DIAGNOSIS — Z87891 Personal history of nicotine dependence: Secondary | ICD-10-CM | POA: Diagnosis not present

## 2018-04-24 DIAGNOSIS — Z510 Encounter for antineoplastic radiation therapy: Secondary | ICD-10-CM | POA: Diagnosis not present

## 2018-04-24 MED ORDER — SUCRALFATE 1 G PO TABS: 1.0000 g | ORAL_TABLET | Freq: Two times a day (BID) | ORAL | 3 refills | Status: DC

## 2018-04-25 ENCOUNTER — Ambulatory Visit
Admission: RE | Admit: 2018-04-25 | Discharge: 2018-04-25 | Disposition: A | Discharge status: Home / Self Care | Payer: Medicare Other | Source: Ambulatory Visit | Attending: Radiation Oncology | Admitting: Radiation Oncology

## 2018-04-25 ENCOUNTER — Ambulatory Visit: Payer: Medicare Other | Admitting: Podiatry

## 2018-04-25 DIAGNOSIS — Z510 Encounter for antineoplastic radiation therapy: Secondary | ICD-10-CM | POA: Diagnosis not present

## 2018-04-25 DIAGNOSIS — Z87891 Personal history of nicotine dependence: Secondary | ICD-10-CM | POA: Diagnosis not present

## 2018-04-25 DIAGNOSIS — C07 Malignant neoplasm of parotid gland: Secondary | ICD-10-CM | POA: Diagnosis not present

## 2018-04-26 ENCOUNTER — Ambulatory Visit: Payer: Medicare Other

## 2018-04-29 ENCOUNTER — Ambulatory Visit
Admission: RE | Admit: 2018-04-29 | Discharge: 2018-04-29 | Disposition: A | Discharge status: Home / Self Care | Payer: Medicare Other | Source: Ambulatory Visit | Attending: Radiation Oncology | Admitting: Radiation Oncology

## 2018-04-29 DIAGNOSIS — C07 Malignant neoplasm of parotid gland: Secondary | ICD-10-CM | POA: Diagnosis not present

## 2018-04-29 DIAGNOSIS — Z87891 Personal history of nicotine dependence: Secondary | ICD-10-CM | POA: Diagnosis not present

## 2018-04-29 DIAGNOSIS — Z510 Encounter for antineoplastic radiation therapy: Secondary | ICD-10-CM | POA: Diagnosis not present

## 2018-04-30 ENCOUNTER — Ambulatory Visit
Admission: RE | Admit: 2018-04-30 | Discharge: 2018-04-30 | Disposition: A | Discharge status: Home / Self Care | Payer: Medicare Other | Source: Ambulatory Visit | Attending: Radiation Oncology | Admitting: Radiation Oncology

## 2018-04-30 ENCOUNTER — Inpatient Hospital Stay: Payer: Medicare Other

## 2018-04-30 DIAGNOSIS — Z87891 Personal history of nicotine dependence: Secondary | ICD-10-CM | POA: Diagnosis not present

## 2018-04-30 DIAGNOSIS — C07 Malignant neoplasm of parotid gland: Secondary | ICD-10-CM | POA: Diagnosis not present

## 2018-04-30 DIAGNOSIS — Z510 Encounter for antineoplastic radiation therapy: Secondary | ICD-10-CM | POA: Diagnosis not present

## 2018-05-01 ENCOUNTER — Ambulatory Visit
Admission: RE | Admit: 2018-05-01 | Discharge: 2018-05-01 | Disposition: A | Discharge status: Home / Self Care | Payer: Medicare Other | Source: Ambulatory Visit | Attending: Radiation Oncology | Admitting: Radiation Oncology

## 2018-05-01 DIAGNOSIS — Z510 Encounter for antineoplastic radiation therapy: Secondary | ICD-10-CM | POA: Diagnosis not present

## 2018-05-01 DIAGNOSIS — C07 Malignant neoplasm of parotid gland: Secondary | ICD-10-CM | POA: Diagnosis not present

## 2018-05-01 DIAGNOSIS — Z87891 Personal history of nicotine dependence: Secondary | ICD-10-CM | POA: Diagnosis not present

## 2018-05-02 ENCOUNTER — Ambulatory Visit
Admission: RE | Admit: 2018-05-02 | Discharge: 2018-05-02 | Disposition: A | Discharge status: Home / Self Care | Payer: Medicare Other | Source: Ambulatory Visit | Attending: Radiation Oncology | Admitting: Radiation Oncology

## 2018-05-02 DIAGNOSIS — C07 Malignant neoplasm of parotid gland: Secondary | ICD-10-CM | POA: Diagnosis not present

## 2018-05-02 DIAGNOSIS — Z87891 Personal history of nicotine dependence: Secondary | ICD-10-CM | POA: Diagnosis not present

## 2018-05-02 DIAGNOSIS — Z510 Encounter for antineoplastic radiation therapy: Secondary | ICD-10-CM | POA: Diagnosis not present

## 2018-05-03 ENCOUNTER — Ambulatory Visit
Admission: RE | Admit: 2018-05-03 | Discharge: 2018-05-03 | Disposition: A | Discharge status: Home / Self Care | Payer: Medicare Other | Source: Ambulatory Visit | Attending: Radiation Oncology | Admitting: Radiation Oncology

## 2018-05-03 DIAGNOSIS — Z510 Encounter for antineoplastic radiation therapy: Secondary | ICD-10-CM | POA: Diagnosis not present

## 2018-05-03 DIAGNOSIS — C07 Malignant neoplasm of parotid gland: Secondary | ICD-10-CM | POA: Diagnosis not present

## 2018-05-03 DIAGNOSIS — Z87891 Personal history of nicotine dependence: Secondary | ICD-10-CM | POA: Diagnosis not present

## 2018-05-06 ENCOUNTER — Ambulatory Visit
Admission: RE | Admit: 2018-05-06 | Discharge: 2018-05-06 | Disposition: A | Discharge status: Home / Self Care | Payer: Medicare Other | Source: Ambulatory Visit | Attending: Radiation Oncology | Admitting: Radiation Oncology

## 2018-05-06 DIAGNOSIS — Z87891 Personal history of nicotine dependence: Secondary | ICD-10-CM | POA: Diagnosis not present

## 2018-05-06 DIAGNOSIS — Z510 Encounter for antineoplastic radiation therapy: Secondary | ICD-10-CM | POA: Diagnosis not present

## 2018-05-06 DIAGNOSIS — C07 Malignant neoplasm of parotid gland: Secondary | ICD-10-CM | POA: Diagnosis not present

## 2018-05-07 ENCOUNTER — Inpatient Hospital Stay: Payer: Medicare Other

## 2018-05-07 ENCOUNTER — Ambulatory Visit
Admission: RE | Admit: 2018-05-07 | Discharge: 2018-05-07 | Disposition: A | Discharge status: Home / Self Care | Payer: Medicare Other | Source: Ambulatory Visit | Attending: Radiation Oncology | Admitting: Radiation Oncology

## 2018-05-07 ENCOUNTER — Other Ambulatory Visit: Payer: Self-pay

## 2018-05-07 DIAGNOSIS — C07 Malignant neoplasm of parotid gland: Secondary | ICD-10-CM | POA: Diagnosis not present

## 2018-05-07 DIAGNOSIS — Z510 Encounter for antineoplastic radiation therapy: Secondary | ICD-10-CM | POA: Diagnosis not present

## 2018-05-07 DIAGNOSIS — Z87891 Personal history of nicotine dependence: Secondary | ICD-10-CM | POA: Diagnosis not present

## 2018-05-07 LAB — CBC
HCT: 32.3 % — ABNORMAL LOW (ref 36.0–46.0)
HEMOGLOBIN: 10 g/dL — AB (ref 12.0–15.0)
MCH: 28.2 pg (ref 26.0–34.0)
MCHC: 31 g/dL (ref 30.0–36.0)
MCV: 91.2 fL (ref 80.0–100.0)
PLATELETS: 265 10*3/uL (ref 150–400)
RBC: 3.54 MIL/uL — AB (ref 3.87–5.11)
RDW: 15.3 % (ref 11.5–15.5)
WBC: 6 10*3/uL (ref 4.0–10.5)
nRBC: 0 % (ref 0.0–0.2)

## 2018-05-08 ENCOUNTER — Ambulatory Visit
Admission: RE | Admit: 2018-05-08 | Discharge: 2018-05-08 | Disposition: A | Discharge status: Home / Self Care | Payer: Medicare Other | Source: Ambulatory Visit | Attending: Radiation Oncology | Admitting: Radiation Oncology

## 2018-05-08 DIAGNOSIS — Z87891 Personal history of nicotine dependence: Secondary | ICD-10-CM | POA: Diagnosis not present

## 2018-05-08 DIAGNOSIS — Z510 Encounter for antineoplastic radiation therapy: Secondary | ICD-10-CM | POA: Diagnosis not present

## 2018-05-08 DIAGNOSIS — C07 Malignant neoplasm of parotid gland: Secondary | ICD-10-CM | POA: Diagnosis not present

## 2018-05-09 ENCOUNTER — Encounter: Payer: Self-pay | Admitting: Oncology

## 2018-05-09 ENCOUNTER — Ambulatory Visit
Admission: RE | Admit: 2018-05-09 | Discharge: 2018-05-09 | Disposition: A | Discharge status: Home / Self Care | Payer: Medicare Other | Source: Ambulatory Visit | Attending: Oncology | Admitting: Oncology

## 2018-05-09 ENCOUNTER — Ambulatory Visit: Payer: Medicare Other

## 2018-05-09 ENCOUNTER — Inpatient Hospital Stay (HOSPITAL_BASED_OUTPATIENT_CLINIC_OR_DEPARTMENT_OTHER): Payer: Medicare Other | Admitting: Oncology

## 2018-05-09 VITALS — BP 116/68 | HR 62 | Temp 97.0°F | Resp 16

## 2018-05-09 DIAGNOSIS — W19XXXA Unspecified fall, initial encounter: Secondary | ICD-10-CM

## 2018-05-09 DIAGNOSIS — M85672 Other cyst of bone, left ankle and foot: Secondary | ICD-10-CM | POA: Insufficient documentation

## 2018-05-09 DIAGNOSIS — T1490XA Injury, unspecified, initial encounter: Secondary | ICD-10-CM | POA: Insufficient documentation

## 2018-05-09 DIAGNOSIS — I739 Peripheral vascular disease, unspecified: Secondary | ICD-10-CM | POA: Insufficient documentation

## 2018-05-09 DIAGNOSIS — C07 Malignant neoplasm of parotid gland: Secondary | ICD-10-CM | POA: Diagnosis not present

## 2018-05-09 DIAGNOSIS — M19072 Primary osteoarthritis, left ankle and foot: Secondary | ICD-10-CM | POA: Diagnosis not present

## 2018-05-09 DIAGNOSIS — Z510 Encounter for antineoplastic radiation therapy: Secondary | ICD-10-CM | POA: Diagnosis not present

## 2018-05-09 DIAGNOSIS — M8580 Other specified disorders of bone density and structure, unspecified site: Secondary | ICD-10-CM | POA: Insufficient documentation

## 2018-05-09 DIAGNOSIS — S99922A Unspecified injury of left foot, initial encounter: Secondary | ICD-10-CM | POA: Diagnosis not present

## 2018-05-09 NOTE — Patient Instructions (Addendum)
  RICE for Routine Care of Injuries Many injuries can be cared for using rest, ice, compression, and elevation (RICE therapy). Using RICE therapy can help to lessen pain and swelling. It can help your body to heal. Rest Reduce your normal activities and avoid using the injured part of your body. You can go back to your normal activities when you feel okay and your doctor says it is okay. Ice Do not put ice on your bare skin.  Put ice in a plastic bag.  Place a towel between your skin and the bag.  Leave the ice on for 20 minutes, 2-3 times a day.  Do this for as long as told by your doctor. Compression Compression means putting pressure on the injured area. This can be done with an elastic bandage. If an elastic bandage has been applied:  Remove and reapply the bandage every 3-4 hours or as told by your doctor.  Make sure the bandage is not wrapped too tight. Wrap the bandage more loosely if part of your body beyond the bandage is blue, swollen, cold, painful, or loses feeling (numb).  See your doctor if the bandage seems to make your problems worse.  Elevation Elevation means keeping the injured area raised. Raise the injured area above your heart or the center of your chest if you can. When should I get help? You should get help if:  You keep having pain and swelling.  Your symptoms get worse.  Get help right away if: You should get help right away if:  You have sudden bad pain at or below the area of your injury.  You have redness or more swelling around your injury.  You have tingling or numbness at or below the injury that does not go away when you take off the bandage.  This information is not intended to replace advice given to you by your health care provider. Make sure you discuss any questions you have with your health care provider. Document Released: 11/22/2007 Document Revised: 05/02/2016 Document Reviewed: 05/13/2014 Elsevier Interactive Patient Education   2017 [employer].   Once we receive results from X-ray we will make recommendations for further follow-up with either PCP or orthopedics if needed.   Please call with immediate concerns.   Can take Ibuprofen for pain as needed or prescribed tramadol.

## 2018-05-09 NOTE — Progress Notes (Signed)
Symptom Management Consult note [facility]  Telephone:([phone] [phone]  Patient Care Team: Rubye Beach as PCP - General (Family Medicine) Minna Merritts, MD as Consulting Physician (Cardiology) [place], [facility] as Consulting Physician [facility])   Name of the patient: Chelsea [name]  [id number]  [birth year]   Date of visit: 05/09/2018  Diagnosis: 1. Fall with injury, initial encounter - DG Foot Complete Left; Future  Chief Complaint: Fall from [facility]   Current Treatment: Radiation to parotid gland   Oncology History:  Patient is a 84 year old female who presented with a slow-growing mass in the right parotid bed now causing right ear pain as well as facial discomfort. Evaluated by Dr. Tami Ribas in August 2019 for posterior jaw swelling and pain intermittently for 1 month. She has a past medical history significant for hypertension prior CVA and status post CABG. CT scan of the head and neck region showed a right superficial parotid space mass with rim enhancement She underwent a ultrasound-guided biopsyshowing malignant neoplasm with necrosis and hemorrhage. Her case was presented at our weekly tumor conference and surgical option was declined recognition was for radiation therapy. She is seen today still having significant facial pain and right ear pain. She specifically denies any dysphagia.  Subjective Data:  Subjective:    Chelsea [name] is a 84 y.o. female who presents with left foot pain. Onset of the symptoms was yesterday. Precipitating event: injured from almost fall from [facility]. Current symptoms include: bruising, inability to bear weight and swelling. Aggravating factors: movement. Symptoms have gradually worsened. Patient has had no prior foot problems. Evaluation to date: none. Treatment to date: OTC analgesics which are not very effective.  The following portions of the patient's  history were reviewed and updated as appropriate: allergies, current medications, past family history, past medical history, past social history, past surgical history and problem list.  Review of Systems A comprehensive review of systems was negative except for: Constitutional: positive for fatigue Musculoskeletal: positive for muscle weakness and left foot pain and swelling    Objective:    BP 116/68 (BP Location: Right Arm, Patient Position: Sitting)   Pulse 62   Temp (!) 97 F (36.1 C) (Tympanic)   Resp 16  Right foot:  normal exam, no swelling, tenderness, instability; ligaments intact, full range of motion of all ankle/foot joints  Left foot:  ecchymoses and swelling of the 2nd, 3rd and 4th toe      Imaging: X-ray of the right foot: no fracture, dislocation, swelling or degenerative changes noted    Assessment:    Left foot pain d/t injury from almost fall. she "hit" her foot on the step of the cancer [facility] causing injury.     Plan:    Educational material distributed. Rest, ice, compression, and elevation (RICE) therapy. NSAIDs per medication orders. OTC analgesics as needed.    Malignant neoplasm of the parotid gland: s/p radiation therapy.  Began therapy on 04/01/18.  Will complete on 05/28/2018.  Per Dr. Donette Larry note, she is not a good surgical candidate and it was recommended they proceed with radiation alone. Had PET scan on 03/19/2018 to complete work-up revealing essentially localized hypermetabolic activity to the right parotid lesion.  No metastatic disease noted.  No underlying lymph node visible.   Left foot pain and bruising: On assessment, evidence of bruising and swelling present. Reproducible pain with internal and external flexion of left foot. Will get Stat x-ray  of left foot to rule out fracture.  Results: No acute bony abnormality identified and no evidence of fracture or dislocation.  Education and instructions provided to patient regarding RICE  (rest, ice, compression and elevation) If symptoms fail to improve or worsen, patient is to be seen by PCP for further management.  Instructed her she can take ibuprofen for pain if needed.  We will CC PCP on this note to ensure follow-up.  Greater than 50% was spent in counseling and coordination of care with this patient including but not limited to discussion of the relevant topics above (See A&P) including, but not limited to diagnosis and management of acute and chronic medical conditions.   Faythe Casa, NP 05/10/2018 11:46 AM   CC: Fenton Malling, PA-C

## 2018-05-10 ENCOUNTER — Ambulatory Visit: Payer: Medicare Other | Admitting: Podiatry

## 2018-05-10 ENCOUNTER — Ambulatory Visit: Payer: Medicare Other

## 2018-05-12 NOTE — Progress Notes (Signed)
Cardiology Office Note  Date:  05/13/2018   ID:  Chelsea [name], DOB 1923-09-06, MRN [id number]  PCP:  Mar Daring, PA-C   Chief Complaint  Patient presents with  . other    12 month follow up. Medications reviewed verbally.     HPI:  84 year-old woman with  CAD s/p CABG, 6073 diastolic dysfunction,  CEA on the left >10 years ago,   <40% b/l carotid disease ,  mildly dilated left atrium,  mild MR,  presenting today for follow-up evaluation of her shortness of breath, carotid arterial disease  Lives at [facility]  XRT to right side of the face, for tumor Tumor size has dramatically improved, cannot feel it anymore Weight dropping, taste buds affected from radiation, food does not taste good Weight 132 in 2018, down to 116 Likes to drink prep which he knows  No orthostasis from weight loss Legs feel weaker Chronic back pain, shortness of breath No chest pain, no nitro  Previously reported having incontinence and stoolleaking,  loose stools, etiology unclear  Previous carotid ultrasound  01/09/16 S/p CEA on the left <39% disease b/l  No recent lipid panel available Total chol 142, LDL 62, 4 years ago   currently not taking Lasix  Declining labs today  EKG personally reviewed by myself on todays visit Shows normal sinus rhythm with rate 66 bpm no significant ST or T wave changes  Other past medical history Previous trips  to the emergency room, had a fall 06/14/2015 went to the emergency room Had joint pain February 16 in her arm went to the emergency room was kept overnight August 2016 had severe pneumonia, long hospital stay  chest pain 07/21/2015 symptoms lasting for 10 minutes Checked her blood pressure, heart rate everything was normal, felt a heaviness in her throat Took aspirin and symptoms seem to resolve on their own  Other past medical history reviewed ECHO 02/11/15 normal study PNA in 01/2015, also with pleural effusion on  CXR  Chronic severe back pain from spinal stenosis. Previous problems with constipation, in the hospital January 2016, CT scan confirming constipation  dizziness in the past, h/o fall with right hip fracture, right arm fracture, 3 ribs injured. She had a long recovery in [place] where the injury occurred. She is now back at [facility]. Previous upper respiratory infection. 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 She has had significant pain, cortisone shots x3 to her hip now wearing a TENS unit for pain. She reports having moderate spinal stenosis.  several admissions to the hospital for abdominal pain. One was in late December 2013, any in February 2014 .  Weight was 147 pounds September 2013, down to 138 pounds in February 2014, down to 131 pounds. Weight has improved on today's visit now up to 143 pounds   hip surgery in March 2014. She had a complicated recovery with bronchitis requiring long period of rehabilitation.  She walks with a cane.  rare abdominal pain. She did take laxatives for a short period of time, now has frequent bowel movements without laxatives.  Previous echo in 05/2009 showed normal LV function without valvular abnormalities   stress test was negative for ischemia.   carotid ultrasound last year showed mild bilateral disease   PMH:   has a past medical history of Asthma, Bradycardia, Carotid artery disease ([place]), Coronary artery disease, Degenerative disc disease, Dyspnea, History of hyperkalemia, Hyperlipidemia, Hypertension, Mitral valve prolapse, Norovirus (2014), Osteoporosis, Sleep apnea, Spinal  stenosis in cervical region, and Stroke ([place]) (1980s).  PSH:    Past Surgical History:  Procedure Laterality Date  . ABDOMINAL HYSTERECTOMY    . BLADDER SUSPENSION    . CAROTID ENDARTERECTOMY     left  . CHOLECYSTECTOMY    . CORONARY ARTERY BYPASS GRAFT    . HIP SURGERY  02/2011   right    Current Outpatient Medications  Medication Sig Dispense Refill   . amLODipine (NORVASC) 5 MG tablet Take 2 tablets (10 mg total) by mouth daily. 90 tablet 3  . atorvastatin (LIPITOR) 80 MG tablet Take 1 tablet (80 mg total) by mouth daily. 90 tablet 3  . Calcium Carbonate-Vit D-Min (CALCIUM 600+D PLUS MINERALS) 600-400 MG-UNIT TABS Take by mouth.    . Cholecalciferol (VITAMIN D3) 10000 units TABS Take by mouth.    . fluticasone furoate-vilanterol (BREO ELLIPTA) 100-25 MCG/INH AEPB Inhale 1 puff into the lungs daily.    Marland Kitchen LORazepam (ATIVAN) 0.5 MG tablet Take 0.5 mg by mouth at bedtime as needed for anxiety.    . metaxalone (SKELAXIN) 800 MG tablet Take 1 tablet (800 mg total) by mouth 3 (three) times daily. 90 tablet 1  . Multiple Vitamins-Minerals (PRESERVISION AREDS PO) Take 1 capsule by mouth daily.     . nitroGLYCERIN (NITROSTAT) 0.4 MG SL tablet Place 1 tablet (0.4 mg total) under the tongue every 5 (five) minutes as needed. 25 tablet 6  . NONFORMULARY OR COMPOUNDED ITEM [facility]  Peripheral Neuropathy Cream- Bupivacaine 1%, Doxepin 3%, Gabapentin 6%, Pentoxifylline 3%, Topiramate 1% Apply 1-2 grams to affected area 3-4 times daily Qty. 120 gm 3 refills    . polyethylene glycol powder (GLYCOLAX/MIRALAX) powder 1 cap full in a full glass of water, two times a day for 3 days. 255 g 0  . sucralfate (CARAFATE) 1 g tablet Take 1 tablet (1 g total) by mouth 2 (two) times daily. Dissolve in 5tbs of warm water swish and swallow 60 tablet 3  . tiotropium (SPIRIVA HANDIHALER) 18 MCG inhalation capsule Place 1 capsule (18 mcg total) into inhaler and inhale daily. 90 capsule 0  . traMADol (ULTRAM) 50 MG tablet Take 50 mg by mouth every 6 (six) hours as needed.    . vitamin C (ASCORBIC ACID) 500 MG tablet Take 500 mg by mouth daily.     No current facility-administered medications for this visit.      Allergies:   Sulfa antibiotics and Sulfonamide derivatives   Social History:  The patient  reports that she quit smoking about 39 years ago. She has  never used smokeless tobacco. She reports that she does not drink alcohol or use drugs.   Family History:   family history includes Stroke in her father; Throat cancer in her mother.    Review of Systems: Review of Systems  Constitutional: Negative.   Respiratory: Positive for shortness of breath.   Cardiovascular: Negative.   Gastrointestinal: Negative.   Musculoskeletal: Positive for back pain.       Gait instability  Neurological: Negative.   Psychiatric/Behavioral: Negative.   All other systems reviewed and are negative.    PHYSICAL EXAM: VS:  BP 128/60 (BP Location: Left Arm, Patient Position: Sitting, Cuff Size: Normal)   Pulse 66   Ht 5\' 7"  (1.702 m)   Wt 116 lb (52.6 kg)   BMI 18.17 kg/m  , BMI Body mass index is 18.17 kg/m. GEN: Well nourished, well developed, in no acute distress , presents with a cane HEENT:  normal  Neck: no JVD, carotid bruits, or masses Cardiac: RRR; no murmurs, rubs, or gallops,no edema  Respiratory:  clear to auscultation bilaterally, crackles in the left base, normal work of breathing GI: soft, nontender, nondistended, + BS MS: no deformity or atrophy  Skin: warm and dry, no rash Neuro:  Strength and sensation are intact Psych: euthymic mood, full affect    Recent Labs: 10/07/2017: ALT 16; BUN 21; Potassium 4.7; Sodium 137 02/06/2018: Creatinine, Ser 0.90 05/07/2018: Hemoglobin 10.0; Platelets 265    Lipid Panel Lab Results  Component Value Date   CHOL 142 05/08/2014   HDL 57 05/08/2014   LDLCALC 62 05/08/2014   TRIG 114 05/08/2014      Wt Readings from Last 3 Encounters:  05/13/18 116 lb (52.6 kg)  03/13/18 (P) 121 lb 9.3 oz (55.2 kg)  02/21/18 128 lb (58.1 kg)     ASSESSMENT AND PLAN:  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 No further workup at this time. Continue current medication regimen. Discussed anginal symptoms to watch  for Sedentary at baseline Prior history of atypical chest pain  Essential hypertension - Plan: EKG 12-Lead Number stable, suggested she call us for any orthostasis symptom 16 pound weight loss in the past year and a half  Carotid artery stenosis, bilateral Stable less than 40% bilaterally,  Continue aggressive lipid management  Hyperlipidemia Numbers at goal, No recent lipid panel available She is declining lab work at this time Recommend she try to have lipid panel done when she sees primary care  Pulmonary fibrosis ([place]) Confined to the left lower lobe. She does have history of previous pneumonia 2016  Chronic stable shortness of breath, not very active  Parotid gland cancer Undergoing radiation, 16 pound weight loss since last year,  Not eating well, lost her taste buds Discussed various strategies for maintaining her weight   Total encounter time more than 25 minutes  Greater than 50% was spent in counseling and coordination of care with the patient  Disposition:   F/U  12 months   Orders Placed This Encounter  Procedures  . EKG 12-Lead     Signed, Esmond Plants, M.D., Ph.D. 05/13/2018  [place], [place]

## 2018-05-13 ENCOUNTER — Ambulatory Visit (INDEPENDENT_AMBULATORY_CARE_PROVIDER_SITE_OTHER): Payer: Medicare Other | Admitting: Cardiovascular Disease

## 2018-05-13 ENCOUNTER — Ambulatory Visit
Admission: RE | Admit: 2018-05-13 | Discharge: 2018-05-13 | Disposition: A | Discharge status: Home / Self Care | Payer: Medicare Other | Source: Ambulatory Visit | Attending: Radiation Oncology | Admitting: Radiation Oncology

## 2018-05-13 ENCOUNTER — Encounter: Payer: Self-pay | Admitting: Cardiovascular Disease

## 2018-05-13 VITALS — BP 128/60 | HR 66 | Ht 67.0 in | Wt 116.0 lb

## 2018-05-13 DIAGNOSIS — I6523 Occlusion and stenosis of bilateral carotid arteries: Secondary | ICD-10-CM

## 2018-05-13 DIAGNOSIS — J432 Centrilobular emphysema: Secondary | ICD-10-CM

## 2018-05-13 DIAGNOSIS — I739 Peripheral vascular disease, unspecified: Secondary | ICD-10-CM | POA: Diagnosis not present

## 2018-05-13 DIAGNOSIS — Z87891 Personal history of nicotine dependence: Secondary | ICD-10-CM | POA: Diagnosis not present

## 2018-05-13 DIAGNOSIS — I25718 Atherosclerosis of autologous vein coronary artery bypass graft(s) with other forms of angina pectoris: Secondary | ICD-10-CM | POA: Diagnosis not present

## 2018-05-13 DIAGNOSIS — I10 Essential (primary) hypertension: Secondary | ICD-10-CM

## 2018-05-13 DIAGNOSIS — E782 Mixed hyperlipidemia: Secondary | ICD-10-CM | POA: Diagnosis not present

## 2018-05-13 DIAGNOSIS — D490 Neoplasm of unspecified behavior of digestive system: Secondary | ICD-10-CM

## 2018-05-13 DIAGNOSIS — Z510 Encounter for antineoplastic radiation therapy: Secondary | ICD-10-CM | POA: Diagnosis not present

## 2018-05-13 DIAGNOSIS — C07 Malignant neoplasm of parotid gland: Secondary | ICD-10-CM | POA: Diagnosis not present

## 2018-05-13 NOTE — Patient Instructions (Signed)

## 2018-05-14 ENCOUNTER — Ambulatory Visit
Admission: RE | Admit: 2018-05-14 | Discharge: 2018-05-14 | Disposition: A | Discharge status: Home / Self Care | Payer: Medicare Other | Source: Ambulatory Visit | Attending: Radiation Oncology | Admitting: Radiation Oncology

## 2018-05-14 ENCOUNTER — Inpatient Hospital Stay: Payer: Medicare Other

## 2018-05-14 DIAGNOSIS — Z510 Encounter for antineoplastic radiation therapy: Secondary | ICD-10-CM | POA: Diagnosis not present

## 2018-05-14 DIAGNOSIS — Z87891 Personal history of nicotine dependence: Secondary | ICD-10-CM | POA: Diagnosis not present

## 2018-05-14 DIAGNOSIS — C07 Malignant neoplasm of parotid gland: Secondary | ICD-10-CM | POA: Diagnosis not present

## 2018-05-15 ENCOUNTER — Ambulatory Visit
Admission: RE | Admit: 2018-05-15 | Discharge: 2018-05-15 | Disposition: A | Discharge status: Home / Self Care | Payer: Medicare Other | Source: Ambulatory Visit | Attending: Radiation Oncology | Admitting: Radiation Oncology

## 2018-05-15 DIAGNOSIS — C07 Malignant neoplasm of parotid gland: Secondary | ICD-10-CM | POA: Diagnosis not present

## 2018-05-15 DIAGNOSIS — Z510 Encounter for antineoplastic radiation therapy: Secondary | ICD-10-CM | POA: Diagnosis not present

## 2018-05-15 DIAGNOSIS — Z87891 Personal history of nicotine dependence: Secondary | ICD-10-CM | POA: Diagnosis not present

## 2018-05-20 ENCOUNTER — Ambulatory Visit
Admission: RE | Admit: 2018-05-20 | Discharge: 2018-05-20 | Disposition: A | Discharge status: Home / Self Care | Payer: Medicare Other | Source: Ambulatory Visit | Attending: Radiation Oncology | Admitting: Radiation Oncology

## 2018-05-20 ENCOUNTER — Encounter: Payer: Self-pay | Admitting: Podiatry

## 2018-05-20 ENCOUNTER — Ambulatory Visit (INDEPENDENT_AMBULATORY_CARE_PROVIDER_SITE_OTHER): Payer: Medicare Other | Admitting: Podiatry

## 2018-05-20 DIAGNOSIS — C07 Malignant neoplasm of parotid gland: Secondary | ICD-10-CM | POA: Insufficient documentation

## 2018-05-20 DIAGNOSIS — B351 Tinea unguium: Secondary | ICD-10-CM | POA: Diagnosis not present

## 2018-05-20 DIAGNOSIS — Z87891 Personal history of nicotine dependence: Secondary | ICD-10-CM | POA: Diagnosis not present

## 2018-05-20 DIAGNOSIS — Z510 Encounter for antineoplastic radiation therapy: Secondary | ICD-10-CM | POA: Diagnosis not present

## 2018-05-20 DIAGNOSIS — M79609 Pain in unspecified limb: Secondary | ICD-10-CM | POA: Diagnosis not present

## 2018-05-20 NOTE — Progress Notes (Signed)
Complaint:  Visit Type: Patient returns to my office for continued preventative foot care services. Complaint: Patient states" my nails have grown long and thick and become painful to walk and wear shoes"  The patient presents for preventative foot care services. No changes to ROS.  Patient says she injured her left foot in getting into a van.  Foot has improved but still has discomfort and is purple.  Podiatric Exam: Vascular: dorsalis pedis and posterior tibial pulses are palpable bilateral. Capillary return is immediate. Temperature gradient is WNL. Skin turgor WNL  Sensorium: Normal Semmes Weinstein monofilament test. Normal tactile sensation bilaterally. Nail Exam: Pt has thick disfigured discolored nails with subungual debris noted bilateral entire nail hallux through fifth toenails Ulcer Exam: There is no evidence of ulcer or pre-ulcerative changes or infection. Orthopedic Exam: Muscle tone and strength are WNL. No limitations in general ROM. No crepitus or effusions noted. Hammer toes  2  B/L. Marland Kitchen Bony prominences are unremarkable. Ecchymosis left foot. Skin: No Porokeratosis. No infection or ulcers  Diagnosis:  Onychomycosis, , Pain in right toe, pain in left toes  Treatment & Plan Procedures and Treatment: Consent by patient was obtained for treatment procedures.   Debridement of mycotic and hypertrophic toenails, 1 through 5 bilateral and clearing of subungual debris. No ulceration, no infection noted.  Return Visit-Office Procedure: Patient instructed to return to the office for a follow up visit 3 months for continued evaluation and treatment.    Gardiner Barefoot DPM

## 2018-05-21 ENCOUNTER — Ambulatory Visit
Admission: RE | Admit: 2018-05-21 | Discharge: 2018-05-21 | Disposition: A | Discharge status: Home / Self Care | Payer: Medicare Other | Source: Ambulatory Visit | Attending: Radiation Oncology | Admitting: Radiation Oncology

## 2018-05-21 DIAGNOSIS — Z87891 Personal history of nicotine dependence: Secondary | ICD-10-CM | POA: Diagnosis not present

## 2018-05-21 DIAGNOSIS — Z510 Encounter for antineoplastic radiation therapy: Secondary | ICD-10-CM | POA: Diagnosis not present

## 2018-05-21 DIAGNOSIS — C07 Malignant neoplasm of parotid gland: Secondary | ICD-10-CM | POA: Diagnosis not present

## 2018-05-22 ENCOUNTER — Ambulatory Visit
Admission: RE | Admit: 2018-05-22 | Discharge: 2018-05-22 | Disposition: A | Discharge status: Home / Self Care | Payer: Medicare Other | Source: Ambulatory Visit | Attending: Radiation Oncology | Admitting: Radiation Oncology

## 2018-05-22 ENCOUNTER — Ambulatory Visit: Payer: Medicare Other

## 2018-05-22 DIAGNOSIS — Z510 Encounter for antineoplastic radiation therapy: Secondary | ICD-10-CM | POA: Diagnosis not present

## 2018-05-22 DIAGNOSIS — C07 Malignant neoplasm of parotid gland: Secondary | ICD-10-CM | POA: Diagnosis not present

## 2018-05-22 DIAGNOSIS — Z87891 Personal history of nicotine dependence: Secondary | ICD-10-CM | POA: Diagnosis not present

## 2018-05-23 ENCOUNTER — Ambulatory Visit
Admission: RE | Admit: 2018-05-23 | Discharge: 2018-05-23 | Disposition: A | Discharge status: Home / Self Care | Payer: Medicare Other | Source: Ambulatory Visit | Attending: Radiation Oncology | Admitting: Radiation Oncology

## 2018-05-23 DIAGNOSIS — Z510 Encounter for antineoplastic radiation therapy: Secondary | ICD-10-CM | POA: Diagnosis not present

## 2018-05-23 DIAGNOSIS — C07 Malignant neoplasm of parotid gland: Secondary | ICD-10-CM | POA: Diagnosis not present

## 2018-05-23 DIAGNOSIS — Z87891 Personal history of nicotine dependence: Secondary | ICD-10-CM | POA: Diagnosis not present

## 2018-05-24 ENCOUNTER — Ambulatory Visit: Payer: Medicare Other

## 2018-05-27 ENCOUNTER — Ambulatory Visit
Admission: RE | Admit: 2018-05-27 | Discharge: 2018-05-27 | Disposition: A | Discharge status: Home / Self Care | Payer: Medicare Other | Source: Ambulatory Visit | Attending: Radiation Oncology | Admitting: Radiation Oncology

## 2018-05-27 ENCOUNTER — Ambulatory Visit: Payer: Medicare Other

## 2018-05-27 DIAGNOSIS — Z510 Encounter for antineoplastic radiation therapy: Secondary | ICD-10-CM | POA: Diagnosis not present

## 2018-05-27 DIAGNOSIS — C07 Malignant neoplasm of parotid gland: Secondary | ICD-10-CM | POA: Diagnosis not present

## 2018-05-27 DIAGNOSIS — Z87891 Personal history of nicotine dependence: Secondary | ICD-10-CM | POA: Diagnosis not present

## 2018-05-28 ENCOUNTER — Ambulatory Visit: Payer: Medicare Other

## 2018-05-28 ENCOUNTER — Ambulatory Visit
Admission: RE | Admit: 2018-05-28 | Discharge: 2018-05-28 | Disposition: A | Discharge status: Home / Self Care | Payer: Medicare Other | Source: Ambulatory Visit | Attending: Radiation Oncology | Admitting: Radiation Oncology

## 2018-05-28 DIAGNOSIS — C07 Malignant neoplasm of parotid gland: Secondary | ICD-10-CM | POA: Diagnosis not present

## 2018-05-28 DIAGNOSIS — Z87891 Personal history of nicotine dependence: Secondary | ICD-10-CM | POA: Diagnosis not present

## 2018-05-28 DIAGNOSIS — Z510 Encounter for antineoplastic radiation therapy: Secondary | ICD-10-CM | POA: Diagnosis not present

## 2018-05-29 ENCOUNTER — Ambulatory Visit
Admission: RE | Admit: 2018-05-29 | Discharge: 2018-05-29 | Disposition: A | Discharge status: Home / Self Care | Payer: Medicare Other | Source: Ambulatory Visit | Attending: Radiation Oncology | Admitting: Radiation Oncology

## 2018-05-29 DIAGNOSIS — Z87891 Personal history of nicotine dependence: Secondary | ICD-10-CM | POA: Diagnosis not present

## 2018-05-29 DIAGNOSIS — C07 Malignant neoplasm of parotid gland: Secondary | ICD-10-CM | POA: Diagnosis not present

## 2018-05-29 DIAGNOSIS — Z510 Encounter for antineoplastic radiation therapy: Secondary | ICD-10-CM | POA: Diagnosis not present

## 2018-06-24 ENCOUNTER — Telehealth: Payer: Self-pay | Admitting: Physician Assistant

## 2018-06-24 NOTE — Telephone Encounter (Signed)
[place] [phone]  Calling to let office know pt receives all her future refill medications through:  [facility]  [place], [address]  [phone] - Phone   Thanks, [place]

## 2018-06-24 NOTE — Telephone Encounter (Signed)
Information updated in Epic.

## 2018-06-25 ENCOUNTER — Ambulatory Visit (INDEPENDENT_AMBULATORY_CARE_PROVIDER_SITE_OTHER): Payer: Medicare Other | Admitting: Physician Assistant

## 2018-06-25 ENCOUNTER — Encounter: Payer: Self-pay | Admitting: Physician Assistant

## 2018-06-25 VITALS — BP 137/72 | HR 78 | Temp 98.2°F | Resp 16 | Wt 113.0 lb

## 2018-06-25 DIAGNOSIS — J0100 Acute maxillary sinusitis, unspecified: Secondary | ICD-10-CM

## 2018-06-25 DIAGNOSIS — K5904 Chronic idiopathic constipation: Secondary | ICD-10-CM | POA: Diagnosis not present

## 2018-06-25 MED ORDER — AMOXICILLIN-POT CLAVULANATE 875-125 MG PO TABS: 1.0000 | ORAL_TABLET | Freq: Two times a day (BID) | ORAL | 0 refills | Status: DC

## 2018-06-25 MED ORDER — SENNOSIDES-DOCUSATE SODIUM 8.6-50 MG PO TABS: 2.0000 | ORAL_TABLET | Freq: Every evening | ORAL | 1 refills | Status: AC | PRN

## 2018-06-25 NOTE — Patient Instructions (Signed)
Sinusitis, Adult  Sinusitis is inflammation of your sinuses. Sinuses are hollow spaces in the bones around your face. Your sinuses are located:   Around your eyes.   In the middle of your forehead.   Behind your nose.   In your cheekbones.  Mucus normally drains out of your sinuses. When your nasal tissues become inflamed or swollen, mucus can become trapped or blocked. This allows bacteria, viruses, and fungi to grow, which leads to infection. Most infections of the sinuses are caused by a virus.  Sinusitis can develop quickly. It can last for up to 4 weeks (acute) or for more than 12 weeks (chronic). Sinusitis often develops after a cold.  What are the causes?  This condition is caused by anything that creates swelling in the sinuses or stops mucus from draining. This includes:   Allergies.   Asthma.   Infection from bacteria or viruses.   Deformities or blockages in your nose or sinuses.   Abnormal growths in the nose (nasal polyps).   Pollutants, such as chemicals or irritants in the air.   Infection from fungi (rare).  What increases the risk?  You are more likely to develop this condition if you:   Have a weak body defense system (immune system).   Do a lot of swimming or diving.   Overuse nasal sprays.   Smoke.  What are the signs or symptoms?  The main symptoms of this condition are pain and a feeling of pressure around the affected sinuses. Other symptoms include:   Stuffy nose or congestion.   Thick drainage from your nose.   Swelling and warmth over the affected sinuses.   Headache.   Upper toothache.   A cough that may get worse at night.   Extra mucus that collects in the throat or the back of the nose (postnasal drip).   Decreased sense of smell and taste.   Fatigue.   A fever.   Sore throat.   Bad breath.  How is this diagnosed?  This condition is diagnosed based on:   Your symptoms.   Your medical history.   A physical exam.   Tests to find out if your condition is  acute or chronic. This may include:  ? Checking your nose for nasal polyps.  ? Viewing your sinuses using a device that has a light (endoscope).  ? Testing for allergies or bacteria.  ? Imaging tests, such as an MRI or CT scan.  In rare cases, a bone biopsy may be done to rule out more serious types of fungal sinus disease.  How is this treated?  Treatment for sinusitis depends on the cause and whether your condition is chronic or acute.   If caused by a virus, your symptoms should go away on their own within 10 days. You may be given medicines to relieve symptoms. They include:  ? Medicines that shrink swollen nasal passages (topical intranasal decongestants).  ? Medicines that treat allergies (antihistamines).  ? A spray that eases inflammation of the nostrils (topical intranasal corticosteroids).  ? Rinses that help get rid of thick mucus in your nose (nasal saline washes).   If caused by bacteria, your health care provider may recommend waiting to see if your symptoms improve. Most bacterial infections will get better without antibiotic medicine. You may be given antibiotics if you have:  ? A severe infection.  ? A weak immune system.   If caused by narrow nasal passages or nasal polyps, you may need   to have surgery.  Follow these instructions at home:  Medicines   Take, use, or apply over-the-counter and prescription medicines only as told by your health care provider. These may include nasal sprays.   If you were prescribed an antibiotic medicine, take it as told by your health care provider. Do not stop taking the antibiotic even if you start to feel better.  Hydrate and humidify     Drink enough fluid to keep your urine pale yellow. Staying hydrated will help to thin your mucus.   Use a cool mist humidifier to keep the humidity level in your home above 50%.   Inhale steam for 10-15 minutes, 3-4 times a day, or as told by your health care provider. You can do this in the bathroom while a hot shower is  running.   Limit your exposure to cool or dry air.  Rest   Rest as much as possible.   Sleep with your head raised (elevated).   Make sure you get enough sleep each night.  General instructions     Apply a warm, moist washcloth to your face 3-4 times a day or as told by your health care provider. This will help with discomfort.   Wash your hands often with soap and water to reduce your exposure to germs. If soap and water are not available, use hand sanitizer.   Do not smoke. Avoid being around people who are smoking (secondhand smoke).   Keep all follow-up visits as told by your health care provider. This is important.  Contact a health care provider if:   You have a fever.   Your symptoms get worse.   Your symptoms do not improve within 10 days.  Get help right away if:   You have a severe headache.   You have persistent vomiting.   You have severe pain or swelling around your face or eyes.   You have vision problems.   You develop confusion.   Your neck is stiff.   You have trouble breathing.  Summary   Sinusitis is soreness and inflammation of your sinuses. Sinuses are hollow spaces in the bones around your face.   This condition is caused by nasal tissues that become inflamed or swollen. The swelling traps or blocks the flow of mucus. This allows bacteria, viruses, and fungi to grow, which leads to infection.   If you were prescribed an antibiotic medicine, take it as told by your health care provider. Do not stop taking the antibiotic even if you start to feel better.   Keep all follow-up visits as told by your health care provider. This is important.  This information is not intended to replace advice given to you by your health care provider. Make sure you discuss any questions you have with your health care provider.  Document Released: 06/05/2005 Document Revised: 11/05/2017 Document Reviewed: 11/05/2017  Elsevier Interactive Patient Education  2019 [employer]

## 2018-06-25 NOTE — Progress Notes (Signed)
Patient: Chelsea [name] Female    DOB: 28-Apr-1924   85 y.o.   MRN: [id number] Visit Date: 06/25/2018  Today's Provider: Mar Daring, PA-C   Chief Complaint  Patient presents with  . Sinusitis   Subjective:     Sinusitis  This is a new problem. The current episode started 1 to 4 weeks ago (3 weeks). There has been no fever. Associated symptoms include congestion, headaches, shortness of breath, sinus pressure and a sore throat. Pertinent negatives include no chills, coughing, diaphoresis, ear pain, hoarse voice, neck pain, sneezing or swollen glands. Treatments tried: nasal spray. The treatment provided no relief.   Patient has had sinus pressure and congestion for 3 weeks. Patient states she also has symptoms of mild headaches, shortness of breath, sore throat. Patient has been using a nasal spray with no relief.   Allergies  Allergen Reactions  . Sulfa Antibiotics     Other reaction(s): Dizziness  . Sulfonamide Derivatives Other (See Comments)    Reaction:  Dizziness      Current Outpatient Medications:  .  amLODipine (NORVASC) 5 MG tablet, Take 2 tablets (10 mg total) by mouth daily., Disp: 90 tablet, Rfl: 3 .  atorvastatin (LIPITOR) 80 MG tablet, Take 1 tablet (80 mg total) by mouth daily., Disp: 90 tablet, Rfl: 3 .  Calcium Carbonate-Vit D-Min (CALCIUM 600+D PLUS MINERALS) 600-400 MG-UNIT TABS, Take by mouth., Disp: , Rfl:  .  levothyroxine (SYNTHROID, LEVOTHROID) 75 MCG tablet, , Disp: , Rfl:  .  Multiple Vitamins-Minerals (PRESERVISION AREDS PO), Take 1 capsule by mouth daily. , Disp: , Rfl:  .  nitroGLYCERIN (NITROSTAT) 0.4 MG SL tablet, Place 1 tablet (0.4 mg total) under the tongue every 5 (five) minutes as needed., Disp: 25 tablet, Rfl: 6 .  traMADol (ULTRAM) 50 MG tablet, Take 50 mg by mouth every 6 (six) hours as needed., Disp: , Rfl:  .  vitamin C (ASCORBIC ACID) 500 MG tablet, Take 500 mg by mouth daily., Disp: , Rfl:  .  Cholecalciferol (VITAMIN  D3) 10000 units TABS, Take by mouth., Disp: , Rfl:  .  fluticasone furoate-vilanterol (BREO ELLIPTA) 100-25 MCG/INH AEPB, Inhale 1 puff into the lungs daily., Disp: , Rfl:  .  LORazepam (ATIVAN) 0.5 MG tablet, Take 0.5 mg by mouth at bedtime as needed for anxiety., Disp: , Rfl:  .  metaxalone (SKELAXIN) 800 MG tablet, Take 1 tablet (800 mg total) by mouth 3 (three) times daily. (Patient not taking: Reported on 06/25/2018), Disp: 90 tablet, Rfl: 1 .  NONFORMULARY OR COMPOUNDED ITEM, [facility]  Peripheral Neuropathy Cream- Bupivacaine 1%, Doxepin 3%, Gabapentin 6%, Pentoxifylline 3%, Topiramate 1% Apply 1-2 grams to affected area 3-4 times daily Qty. 120 gm 3 refills, Disp: , Rfl:  .  polyethylene glycol powder (GLYCOLAX/MIRALAX) powder, 1 cap full in a full glass of water, two times a day for 3 days. (Patient not taking: Reported on 06/25/2018), Disp: 255 g, Rfl: 0 .  sucralfate (CARAFATE) 1 g tablet, Take 1 tablet (1 g total) by mouth 2 (two) times daily. Dissolve in 5tbs of warm water swish and swallow (Patient not taking: Reported on 06/25/2018), Disp: 60 tablet, Rfl: 3 .  tiotropium (SPIRIVA HANDIHALER) 18 MCG inhalation capsule, Place 1 capsule (18 mcg total) into inhaler and inhale daily. (Patient not taking: Reported on 06/25/2018), Disp: 90 capsule, Rfl: 0  Review of Systems  Constitutional: Negative for appetite change, chills, diaphoresis, fatigue and fever.  HENT: Positive  for congestion, sinus pressure and sore throat. Negative for ear pain, hoarse voice and sneezing.   Respiratory: Positive for shortness of breath. Negative for cough and chest tightness.   Cardiovascular: Negative for chest pain and palpitations.  Gastrointestinal: Negative for abdominal pain, nausea and vomiting.  Musculoskeletal: Negative for neck pain.  Neurological: Positive for headaches. Negative for dizziness and weakness.    Social History   Tobacco Use  . Smoking status: Former Smoker    Last attempt to  quit: 02/22/1979    Years since quitting: 39.3  . Smokeless tobacco: Never Used  . Tobacco comment: quit 50 years ago  Substance Use Topics  . Alcohol use: No      Objective:   BP 137/72 (BP Location: Left Arm, Patient Position: Sitting, Cuff Size: Normal)   Pulse 78   Temp 98.2 F (36.8 C) (Oral)   Resp 16   Wt 113 lb (51.3 kg)   SpO2 98%   BMI 17.70 kg/m  Vitals:   06/25/18 1600  BP: 137/72  Pulse: 78  Resp: 16  Temp: 98.2 F (36.8 C)  TempSrc: Oral  SpO2: 98%  Weight: 113 lb (51.3 kg)     Physical Exam Vitals signs reviewed.  Constitutional:      General: She is not in acute distress.    Appearance: She is well-developed. She is not diaphoretic.  HENT:     Head: Normocephalic and atraumatic.     Right Ear: Hearing, tympanic membrane, ear canal and external ear normal.     Left Ear: Hearing, tympanic membrane, ear canal and external ear normal.     Nose:     Right Sinus: Maxillary sinus tenderness present. No frontal sinus tenderness.     Left Sinus: Maxillary sinus tenderness present. No frontal sinus tenderness.     Mouth/Throat:     Pharynx: Uvula midline. No oropharyngeal exudate.  Neck:     Musculoskeletal: Normal range of motion and neck supple.     Thyroid: No thyromegaly.     Trachea: No tracheal deviation.  Cardiovascular:     Rate and Rhythm: Normal rate and regular rhythm.     Heart sounds: Normal heart sounds. No murmur. No friction rub. No gallop.   Pulmonary:     Effort: Pulmonary effort is normal. No respiratory distress.     Breath sounds: Normal breath sounds. No stridor. No wheezing or rales.  Lymphadenopathy:     Cervical: No cervical adenopathy.        Assessment & Plan    1. Acute non-recurrent maxillary sinusitis Worsening symptoms that have not responded to OTC medications. Will give augmentin as below. Continue allergy medications. Stay well hydrated and get plenty of rest. Call if no symptom improvement or if symptoms  worsen. - amoxicillin-clavulanate (AUGMENTIN) 875-125 MG tablet; Take 1 tablet by mouth 2 (two) times daily.  Dispense: 20 tablet; Refill: 0  2. Chronic idiopathic constipation Will restart senokot for constipation as below. She is to call if not working. - senna-docusate (SENOKOT-S) 8.6-50 MG tablet; Take 2 tablets by mouth at bedtime as needed for mild constipation.  Dispense: 90 tablet; Refill: [place], PA-C  [facility]

## 2018-07-04 ENCOUNTER — Encounter: Payer: Self-pay | Admitting: Radiation Oncology

## 2018-07-04 ENCOUNTER — Other Ambulatory Visit: Payer: Self-pay

## 2018-07-04 ENCOUNTER — Ambulatory Visit
Admission: RE | Admit: 2018-07-04 | Discharge: 2018-07-04 | Disposition: A | Discharge status: Home / Self Care | Payer: Medicare Other | Source: Ambulatory Visit | Attending: Radiation Oncology | Admitting: Radiation Oncology

## 2018-07-04 VITALS — BP 148/70 | HR 81 | Temp 97.2°F | Resp 16 | Wt 114.1 lb

## 2018-07-04 DIAGNOSIS — M5416 Radiculopathy, lumbar region: Secondary | ICD-10-CM | POA: Diagnosis not present

## 2018-07-04 DIAGNOSIS — M48062 Spinal stenosis, lumbar region with neurogenic claudication: Secondary | ICD-10-CM | POA: Diagnosis not present

## 2018-07-04 DIAGNOSIS — Z923 Personal history of irradiation: Secondary | ICD-10-CM | POA: Diagnosis not present

## 2018-07-04 DIAGNOSIS — M5136 Other intervertebral disc degeneration, lumbar region: Secondary | ICD-10-CM | POA: Diagnosis not present

## 2018-07-04 DIAGNOSIS — C07 Malignant neoplasm of parotid gland: Secondary | ICD-10-CM | POA: Insufficient documentation

## 2018-07-04 NOTE — Progress Notes (Signed)
Radiation Oncology Follow up Note  Name: Chelsea [name]   Date:   07/04/2018 MRN:  [id number] DOB: 1923/10/10    This 85 y.o. 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  REFERRING PROVIDER: Florian Buff*  HPI: patient is a 85 year old female now 1 month out having received radiation therapy to a rapidly growing mass in the right parotid bed causing right ear pain as well as facial discomfort.Biopsy was positive for malignant neoplasm with necrosis and hemorrhage. She underwent radiation therapy and is now seen 1 month out she is doing extremely well mass is completely resolved. She's having no facial pain or discomfort she specifically denies head and neck pain or dysphagia..  COMPLICATIONS OF TREATMENT: none  FOLLOW UP COMPLIANCE: keeps appointments   PHYSICAL EXAM:  BP (!) 148/70 (BP Location: Left Arm, Patient Position: Sitting)   Pulse 81   Temp (!) 97.2 F (36.2 C) (Tympanic)   Resp 16   Wt 114 lb 1.4 oz (51.8 kg)   BMI 17.87 kg/m  Right parotid bed is completely clear of mass or nodularity no evidence of cervical or supraclavicular adenopathies identified. Oral cavity is clear with no oral mucosal lesions identified.Well-developed well-nourished patient in NAD. HEENT reveals PERLA, EOMI, discs not visualized.  Oral cavity is clear. No oral mucosal lesions are identified. Neck is clear without evidence of cervical or supraclavicular adenopathy. Lungs are clear to A&P. Cardiac examination is essentially unremarkable with regular rate and rhythm without murmur rub or thrill. Abdomen is benign with no organomegaly or masses noted. Motor sensory and DTR levels are equal and symmetric in the upper and lower extremities. Cranial nerves II through XII are grossly intact. Proprioception is intact. No peripheral adenopathy or edema is identified. No motor or sensory levels are noted.  Crude visual fields are within normal range.  RADIOLOGY RESULTS: no current films for review  PLAN: present time she's had a complete response to radiation therapy am please were overall progress. I've asked to see her back in 3-4 months for follow-up. Patient is to call with any concerns at any time. I'm extremely pleased with her overall result.  I would like to take this opportunity to thank you for allowing me to participate in the care of your patient.Noreene Filbert, MD

## 2018-07-17 DIAGNOSIS — H353124 Nonexudative age-related macular degeneration, left eye, advanced atrophic with subfoveal involvement: Secondary | ICD-10-CM | POA: Diagnosis not present

## 2018-08-19 ENCOUNTER — Encounter: Payer: Self-pay | Admitting: Podiatry

## 2018-08-19 ENCOUNTER — Ambulatory Visit (INDEPENDENT_AMBULATORY_CARE_PROVIDER_SITE_OTHER): Payer: Medicare Other | Admitting: Podiatry

## 2018-08-19 DIAGNOSIS — B351 Tinea unguium: Secondary | ICD-10-CM | POA: Diagnosis not present

## 2018-08-19 DIAGNOSIS — M79676 Pain in unspecified toe(s): Secondary | ICD-10-CM

## 2018-08-19 DIAGNOSIS — M79609 Pain in unspecified limb: Principal | ICD-10-CM

## 2018-08-19 NOTE — Progress Notes (Signed)
Complaint:  Visit Type: Patient returns to my office for continued preventative foot care services. Complaint: Patient states" my nails have grown long and thick and become painful to walk and wear shoes"  The patient presents for preventative foot care services. No changes to ROS.  Patient says her middle toe right foot is sore.  Podiatric Exam: Vascular: dorsalis pedis and posterior tibial pulses are palpable bilateral. Capillary return is immediate. Temperature gradient is WNL. Skin turgor WNL  Sensorium: Normal Semmes Weinstein monofilament test. Normal tactile sensation bilaterally. Nail Exam: Pt has thick disfigured discolored nails with subungual debris noted bilateral entire nail hallux through fifth toenails Ulcer Exam: There is no evidence of ulcer or pre-ulcerative changes or infection. Orthopedic Exam: Muscle tone and strength are WNL. No limitations in general ROM. No crepitus or effusions noted. Hammer toes  2  B/L. Marland Kitchen Bony prominences are unremarkable Skin: No Porokeratosis. No infection or ulcers  Diagnosis:  Onychomycosis, , Pain in right toe, pain in left toes  Treatment & Plan Procedures and Treatment: Consent by patient was obtained for treatment procedures.   Debridement of mycotic and hypertrophic toenails, 1 through 5 bilateral and clearing of subungual debris. No ulceration, no infection noted. Padding dispensed to wear on third toe right foot. Return Visit-Office Procedure: Patient instructed to return to the office for a follow up visit 3 months for continued evaluation and treatment.    Gardiner Barefoot DPM

## 2018-08-22 ENCOUNTER — Ambulatory Visit (INDEPENDENT_AMBULATORY_CARE_PROVIDER_SITE_OTHER): Payer: Medicare Other | Admitting: Family Medicine

## 2018-08-22 ENCOUNTER — Encounter: Payer: Self-pay | Admitting: Family Medicine

## 2018-08-22 VITALS — BP 124/71 | HR 64 | Temp 97.9°F | Wt 112.6 lb

## 2018-08-22 DIAGNOSIS — J0140 Acute pansinusitis, unspecified: Secondary | ICD-10-CM | POA: Diagnosis not present

## 2018-08-22 MED ORDER — FLUTICASONE PROPIONATE 50 MCG/ACT NA SUSP: 2.0000 | Freq: Every day | NASAL | 6 refills | Status: DC

## 2018-08-22 MED ORDER — AMOXICILLIN-POT CLAVULANATE 875-125 MG PO TABS: 1.0000 | ORAL_TABLET | Freq: Two times a day (BID) | ORAL | 0 refills | Status: AC

## 2018-08-22 NOTE — Progress Notes (Signed)
Patient: Chelsea [name] Female    DOB: 1924/03/09   85 y.o.   MRN: [id number] Visit Date: 08/22/2018  Today's Provider: Lavon Paganini, MD   Chief Complaint  Patient presents with  . Sore Throat   Subjective:     Sore Throat   This is a new problem. The current episode started in the past 7 days. The problem has been unchanged. Neither side of throat is experiencing more pain than the other. The maximum temperature recorded prior to her arrival was 100.4 - 100.9 F (Sunday). The fever has been present for 1 to 2 days. The pain is at a severity of 6/10. The pain is mild. Associated symptoms include coughing, ear pain (both), headaches and a hoarse voice. Associated symptoms comments: Post nasal drip. She has tried nothing for the symptoms. The treatment provided no relief.    Allergies  Allergen Reactions  . Sulfa Antibiotics     Other reaction(s): Dizziness  . Sulfonamide Derivatives Other (See Comments)    Reaction:  Dizziness      Current Outpatient Medications:  .  amLODipine (NORVASC) 5 MG tablet, Take 2 tablets (10 mg total) by mouth daily., Disp: 90 tablet, Rfl: 3 .  aspirin EC 81 MG tablet, Take 81 mg by mouth daily., Disp: , Rfl:  .  atorvastatin (LIPITOR) 80 MG tablet, Take 1 tablet (80 mg total) by mouth daily., Disp: 90 tablet, Rfl: 3 .  Calcium Carbonate-Vit D-Min (CALCIUM 600+D PLUS MINERALS) 600-400 MG-UNIT TABS, Take by mouth., Disp: , Rfl:  .  levothyroxine (SYNTHROID, LEVOTHROID) 75 MCG tablet, , Disp: , Rfl:  .  nitroGLYCERIN (NITROSTAT) 0.4 MG SL tablet, Place 1 tablet (0.4 mg total) under the tongue every 5 (five) minutes as needed., Disp: 25 tablet, Rfl: 6 .  vitamin C (ASCORBIC ACID) 500 MG tablet, Take 500 mg by mouth daily., Disp: , Rfl:  .  amoxicillin-clavulanate (AUGMENTIN) 875-125 MG tablet, Take 1 tablet by mouth 2 (two) times daily. (Patient not taking: Reported on 08/22/2018), Disp: 20 tablet, Rfl: 0 .  Cholecalciferol (VITAMIN D3) 10000 units  TABS, Take by mouth., Disp: , Rfl:  .  fluticasone furoate-vilanterol (BREO ELLIPTA) 100-25 MCG/INH AEPB, Inhale 1 puff into the lungs daily., Disp: , Rfl:  .  LORazepam (ATIVAN) 0.5 MG tablet, Take 0.5 mg by mouth at bedtime as needed for anxiety., Disp: , Rfl:  .  Multiple Vitamins-Minerals (PRESERVISION AREDS PO), Take 1 capsule by mouth daily. , Disp: , Rfl:  .  NONFORMULARY OR COMPOUNDED ITEM, [facility]  Peripheral Neuropathy Cream- Bupivacaine 1%, Doxepin 3%, Gabapentin 6%, Pentoxifylline 3%, Topiramate 1% Apply 1-2 grams to affected area 3-4 times daily Qty. 120 gm 3 refills, Disp: , Rfl:  .  senna-docusate (SENOKOT-S) 8.6-50 MG tablet, Take 2 tablets by mouth at bedtime as needed for mild constipation. (Patient not taking: Reported on 08/22/2018), Disp: 90 tablet, Rfl: 1 .  traMADol (ULTRAM) 50 MG tablet, Take 50 mg by mouth every 6 (six) hours as needed., Disp: , Rfl:   Review of Systems  Constitutional: Positive for appetite change, fatigue and fever. Chills: not hungry.  HENT: Positive for ear pain (both), hoarse voice, postnasal drip, rhinorrhea, sinus pressure, sinus pain, sore throat and voice change.   Respiratory: Positive for cough.   Neurological: Positive for headaches.    Social History   Tobacco Use  . Smoking status: Former Smoker    Last attempt to quit: 02/22/1979    Years since  quitting: 39.5  . Smokeless tobacco: Never Used  . Tobacco comment: quit 50 years ago  Substance Use Topics  . Alcohol use: No      Objective:   There were no vitals taken for this visit. There were no vitals filed for this visit.   Physical Exam Vitals signs reviewed.  Constitutional:      General: She is not in acute distress.    Appearance: She is well-developed. She is not diaphoretic.  HENT:     Head: Normocephalic and atraumatic.     Right Ear: Tympanic membrane, ear canal and external ear normal.     Left Ear: Tympanic membrane, ear canal and external ear normal.      Nose: Congestion present.     Right Turbinates: Enlarged.     Left Turbinates: Enlarged.     Right Sinus: Maxillary sinus tenderness and frontal sinus tenderness present.     Left Sinus: Maxillary sinus tenderness and frontal sinus tenderness present.     Mouth/Throat:     Mouth: Mucous membranes are moist.     Pharynx: Oropharynx is clear. Posterior oropharyngeal erythema (mild) present. No pharyngeal swelling or oropharyngeal exudate.  Eyes:     Conjunctiva/sclera: Conjunctivae normal.     Pupils: Pupils are equal, round, and reactive to light.  Neck:     Musculoskeletal: Neck supple.  Cardiovascular:     Rate and Rhythm: Normal rate and regular rhythm.  Pulmonary:     Effort: Pulmonary effort is normal. No respiratory distress.     Breath sounds: Normal breath sounds. No wheezing or rhonchi.  Abdominal:     General: There is no distension.     Palpations: Abdomen is soft.     Tenderness: There is no abdominal tenderness.  Lymphadenopathy:     Cervical: No cervical adenopathy.  Skin:    General: Skin is warm and dry.     Capillary Refill: Capillary refill takes less than 2 seconds.     Findings: No rash.  Neurological:     General: No focal deficit present.     Mental Status: She is alert and oriented to person, place, and time.  Psychiatric:        Mood and Affect: Mood normal.        Behavior: Behavior normal.         Assessment & Plan   1. Acute non-recurrent pansinusitis - symptoms and exam c/w sinusitis   - no evidence of AOM, CAP, strep pharyngitis, or other infection - will treat with Augmentin x7d - discussed symptomatic management (flonase, decongestants, etc), natural course, and return precautions   - amoxicillin-clavulanate (AUGMENTIN) 875-125 MG tablet; Take 1 tablet by mouth 2 (two) times daily for 7 days.  Dispense: 14 tablet; Refill: 0    Meds ordered this encounter  Medications  . amoxicillin-clavulanate (AUGMENTIN) 875-125 MG tablet    Sig:  Take 1 tablet by mouth 2 (two) times daily for 7 days.    Dispense:  14 tablet    Refill:  0  . fluticasone (FLONASE) 50 MCG/ACT nasal spray    Sig: Place 2 sprays into both nostrils daily.    Dispense:  16 g    Refill:  6     Return if symptoms worsen or fail to improve.   The entirety of the information documented in the History of Present Illness, Review of Systems and Physical Exam were personally obtained by me. Portions of this information were initially documented by Tiburcio Pea  and Lia Foyer, CMA and reviewed by me for thoroughness and accuracy.    Virginia Crews, MD, MPH [facility] 08/22/2018 11:08 AM

## 2018-08-22 NOTE — Patient Instructions (Signed)
Sinusitis, Adult  Sinusitis is inflammation of your sinuses. Sinuses are hollow spaces in the bones around your face. Your sinuses are located:   Around your eyes.   In the middle of your forehead.   Behind your nose.   In your cheekbones.  Mucus normally drains out of your sinuses. When your nasal tissues become inflamed or swollen, mucus can become trapped or blocked. This allows bacteria, viruses, and fungi to grow, which leads to infection. Most infections of the sinuses are caused by a virus.  Sinusitis can develop quickly. It can last for up to 4 weeks (acute) or for more than 12 weeks (chronic). Sinusitis often develops after a cold.  What are the causes?  This condition is caused by anything that creates swelling in the sinuses or stops mucus from draining. This includes:   Allergies.   Asthma.   Infection from bacteria or viruses.   Deformities or blockages in your nose or sinuses.   Abnormal growths in the nose (nasal polyps).   Pollutants, such as chemicals or irritants in the air.   Infection from fungi (rare).  What increases the risk?  You are more likely to develop this condition if you:   Have a weak body defense system (immune system).   Do a lot of swimming or diving.   Overuse nasal sprays.   Smoke.  What are the signs or symptoms?  The main symptoms of this condition are pain and a feeling of pressure around the affected sinuses. Other symptoms include:   Stuffy nose or congestion.   Thick drainage from your nose.   Swelling and warmth over the affected sinuses.   Headache.   Upper toothache.   A cough that may get worse at night.   Extra mucus that collects in the throat or the back of the nose (postnasal drip).   Decreased sense of smell and taste.   Fatigue.   A fever.   Sore throat.   Bad breath.  How is this diagnosed?  This condition is diagnosed based on:   Your symptoms.   Your medical history.   A physical exam.   Tests to find out if your condition is  acute or chronic. This may include:  ? Checking your nose for nasal polyps.  ? Viewing your sinuses using a device that has a light (endoscope).  ? Testing for allergies or bacteria.  ? Imaging tests, such as an MRI or CT scan.  In rare cases, a bone biopsy may be done to rule out more serious types of fungal sinus disease.  How is this treated?  Treatment for sinusitis depends on the cause and whether your condition is chronic or acute.   If caused by a virus, your symptoms should go away on their own within 10 days. You may be given medicines to relieve symptoms. They include:  ? Medicines that shrink swollen nasal passages (topical intranasal decongestants).  ? Medicines that treat allergies (antihistamines).  ? A spray that eases inflammation of the nostrils (topical intranasal corticosteroids).  ? Rinses that help get rid of thick mucus in your nose (nasal saline washes).   If caused by bacteria, your health care provider may recommend waiting to see if your symptoms improve. Most bacterial infections will get better without antibiotic medicine. You may be given antibiotics if you have:  ? A severe infection.  ? A weak immune system.   If caused by narrow nasal passages or nasal polyps, you may need   to have surgery.  Follow these instructions at home:  Medicines   Take, use, or apply over-the-counter and prescription medicines only as told by your health care provider. These may include nasal sprays.   If you were prescribed an antibiotic medicine, take it as told by your health care provider. Do not stop taking the antibiotic even if you start to feel better.  Hydrate and humidify     Drink enough fluid to keep your urine pale yellow. Staying hydrated will help to thin your mucus.   Use a cool mist humidifier to keep the humidity level in your home above 50%.   Inhale steam for 10-15 minutes, 3-4 times a day, or as told by your health care provider. You can do this in the bathroom while a hot shower is  running.   Limit your exposure to cool or dry air.  Rest   Rest as much as possible.   Sleep with your head raised (elevated).   Make sure you get enough sleep each night.  General instructions     Apply a warm, moist washcloth to your face 3-4 times a day or as told by your health care provider. This will help with discomfort.   Wash your hands often with soap and water to reduce your exposure to germs. If soap and water are not available, use hand sanitizer.   Do not smoke. Avoid being around people who are smoking (secondhand smoke).   Keep all follow-up visits as told by your health care provider. This is important.  Contact a health care provider if:   You have a fever.   Your symptoms get worse.   Your symptoms do not improve within 10 days.  Get help right away if:   You have a severe headache.   You have persistent vomiting.   You have severe pain or swelling around your face or eyes.   You have vision problems.   You develop confusion.   Your neck is stiff.   You have trouble breathing.  Summary   Sinusitis is soreness and inflammation of your sinuses. Sinuses are hollow spaces in the bones around your face.   This condition is caused by nasal tissues that become inflamed or swollen. The swelling traps or blocks the flow of mucus. This allows bacteria, viruses, and fungi to grow, which leads to infection.   If you were prescribed an antibiotic medicine, take it as told by your health care provider. Do not stop taking the antibiotic even if you start to feel better.   Keep all follow-up visits as told by your health care provider. This is important.  This information is not intended to replace advice given to you by your health care provider. Make sure you discuss any questions you have with your health care provider.  Document Released: 06/05/2005 Document Revised: 11/05/2017 Document Reviewed: 11/05/2017  Elsevier Interactive Patient Education  2019 [employer]

## 2018-09-02 ENCOUNTER — Ambulatory Visit: Payer: Self-pay

## 2018-09-18 ENCOUNTER — Ambulatory Visit: Payer: Self-pay

## 2018-10-17 ENCOUNTER — Encounter: Payer: Self-pay | Admitting: *Deleted

## 2018-11-06 ENCOUNTER — Ambulatory Visit: Payer: Medicare Other | Admitting: Radiation Oncology

## 2018-11-07 DIAGNOSIS — J449 Chronic obstructive pulmonary disease, unspecified: Secondary | ICD-10-CM | POA: Diagnosis not present

## 2018-11-18 ENCOUNTER — Ambulatory Visit: Payer: Medicare Other | Admitting: Podiatry

## 2018-11-19 DIAGNOSIS — M5416 Radiculopathy, lumbar region: Secondary | ICD-10-CM | POA: Diagnosis not present

## 2018-11-19 DIAGNOSIS — M48062 Spinal stenosis, lumbar region with neurogenic claudication: Secondary | ICD-10-CM | POA: Diagnosis not present

## 2018-11-19 DIAGNOSIS — M5136 Other intervertebral disc degeneration, lumbar region: Secondary | ICD-10-CM | POA: Diagnosis not present

## 2018-11-25 ENCOUNTER — Other Ambulatory Visit: Payer: Self-pay

## 2018-11-25 ENCOUNTER — Ambulatory Visit (INDEPENDENT_AMBULATORY_CARE_PROVIDER_SITE_OTHER): Payer: Medicare Other | Admitting: Podiatry

## 2018-11-25 ENCOUNTER — Encounter: Payer: Self-pay | Admitting: Podiatry

## 2018-11-25 VITALS — Temp 97.5°F

## 2018-11-25 DIAGNOSIS — B351 Tinea unguium: Secondary | ICD-10-CM

## 2018-11-25 DIAGNOSIS — M79676 Pain in unspecified toe(s): Secondary | ICD-10-CM

## 2018-11-25 DIAGNOSIS — M79609 Pain in unspecified limb: Secondary | ICD-10-CM

## 2018-11-25 NOTE — Progress Notes (Signed)
Complaint:  Visit Type: Patient returns to my office for continued preventative foot care services. Complaint: Patient states" my nails have grown long and thick and become painful to walk and wear shoes"  The patient presents for preventative foot care services. No changes to ROS.  Patient says her middle toe right foot is sore.  Podiatric Exam: Vascular: dorsalis pedis and posterior tibial pulses are palpable bilateral. Capillary return is immediate. Temperature gradient is WNL. Skin turgor WNL  Sensorium: Normal Semmes Weinstein monofilament test. Normal tactile sensation bilaterally. Nail Exam: Pt has thick disfigured discolored nails with subungual debris noted bilateral entire nail hallux through fifth toenails Ulcer Exam: There is no evidence of ulcer or pre-ulcerative changes or infection. Orthopedic Exam: Muscle tone and strength are WNL. No limitations in general ROM. No crepitus or effusions noted. Hammer toes  2  B/L. Marland Kitchen Bony prominences are unremarkable Skin: No Porokeratosis. No infection or ulcers  Diagnosis:  Onychomycosis, , Pain in right toe, pain in left toes  Treatment & Plan Procedures and Treatment: Consent by patient was obtained for treatment procedures.   Debridement of mycotic and hypertrophic toenails, 1 through 5 bilateral and clearing of subungual debris. No ulceration, no infection noted.  Return Visit-Office Procedure: Patient instructed to return to the office for a follow up visit 3 months for continued evaluation and treatment.    Gardiner Barefoot DPM

## 2018-12-04 ENCOUNTER — Telehealth: Payer: Self-pay

## 2018-12-04 ENCOUNTER — Telehealth: Payer: Self-pay | Admitting: *Deleted

## 2018-12-04 ENCOUNTER — Telehealth: Payer: Self-pay | Admitting: General Practice

## 2018-12-04 DIAGNOSIS — Z20822 Contact with and (suspected) exposure to covid-19: Secondary | ICD-10-CM

## 2018-12-04 NOTE — Telephone Encounter (Signed)
Porsha with [facility] is the contact to schedule the Covid testing.    Contact number is [phone]   Thanks,   -Mickel Baas

## 2018-12-04 NOTE — Telephone Encounter (Signed)
Notified Porsha at [facility] regarding an appointment for Chelsea [name]. She is scheduled for tomorrow at [place] at 11 am. Durward Fortes that this is a drive thru test site and she should wear a mask, stay in car with windows rolled up until ready for testing. Porsha voice understanding.

## 2018-12-04 NOTE — Telephone Encounter (Signed)
Social worker at [facility] called to say that the patient was notified by the health department she was in contact with a hair dresser that tested positive and she wants to be tested.    Porsh at [facility] Call back # is [phone]

## 2018-12-04 NOTE — Telephone Encounter (Signed)
Sent request to [facility] community testing center.

## 2018-12-04 NOTE — Telephone Encounter (Signed)
Called pt to schedule her for covid testing. Pt asked that I call Gabriel Cirri w/[facility] at: [phone] instead to assist me with scheduling pt.   Called Sabrina, no answer, left detailed vm asking for a call back to assist with scheduling pt.

## 2018-12-05 ENCOUNTER — Other Ambulatory Visit: Payer: Medicare Other

## 2018-12-05 DIAGNOSIS — R6889 Other general symptoms and signs: Secondary | ICD-10-CM | POA: Diagnosis not present

## 2018-12-05 DIAGNOSIS — Z20822 Contact with and (suspected) exposure to covid-19: Secondary | ICD-10-CM

## 2018-12-05 NOTE — Telephone Encounter (Signed)
Pt has been scheduled for covid testing.  ° °

## 2018-12-07 LAB — NOVEL CORONAVIRUS, NAA: SARS-CoV-2, NAA: NOT DETECTED

## 2018-12-24 ENCOUNTER — Ambulatory Visit (INDEPENDENT_AMBULATORY_CARE_PROVIDER_SITE_OTHER): Payer: Medicare Other

## 2018-12-24 ENCOUNTER — Other Ambulatory Visit: Payer: Self-pay

## 2018-12-24 DIAGNOSIS — Z0000 Encounter for general adult medical examination without abnormal findings: Secondary | ICD-10-CM | POA: Diagnosis not present

## 2018-12-24 NOTE — Progress Notes (Signed)
Subjective:   Chelsea [name] is a 85 y.o. female who presents for Medicare Annual (Subsequent) preventive examination.    This visit is being conducted through telemedicine due to the COVID-19 pandemic. This patient has given me verbal consent via doximity to conduct this visit, patient states they are participating from their home address. Some vital signs may be absent or patient reported.    Patient identification: identified by name, DOB, and current address  Review of Systems:  N/A  Cardiac Risk Factors include: advanced age (>45men, >18 women);dyslipidemia;hypertension     Objective:     Vitals: There were no vitals taken for this visit.  There is no height or weight on file to calculate BMI. Unable to obtain vitals due to visit being conducted via telephonically.   Advanced Directives 12/24/2018 07/04/2018 05/09/2018 03/13/2018 02/21/2018 09/25/2017 08/28/2017  Does Patient Have a Medical Advance Directive? Yes Yes Yes Yes Yes No Yes  Type of Paramedic of [place];Living will [place];Living will - Living will;Healthcare Power of [place];Living will - [place];Living will  Does patient want to make changes to medical advance directive? - No - Patient declined No - Patient declined - No - Patient declined - -  Copy of [place] in Chart? No - copy requested No - copy requested - - No - copy requested - No - copy requested  Would patient like information on creating a medical advance directive? - No - Patient declined - - - No - Patient declined -    Tobacco Social History   Tobacco Use  Smoking Status Former Smoker  . Quit date: 02/22/1979  . Years since quitting: 39.8  Smokeless Tobacco Never Used  Tobacco Comment   quit 50 years ago     Counseling given: Not Answered Comment: quit 50 years ago   Clinical Intake:  Pre-visit preparation completed: Yes   Pain : No/denies pain Pain Score: 0-No pain     Nutritional Risks: None Diabetes: No  How often do you need to have someone help you when you read instructions, pamphlets, or other written materials from your doctor or pharmacy?: 1 - Never  Interpreter Needed?: No  Information entered by :: MMarkoski, LPN  Past Medical History:  Diagnosis Date  . Asthma   . Bradycardia    hx of it and fatigue with beta blockade  . Carotid artery disease ([place])    s/p carotid endarterectomy  . Coronary artery disease    a. s/p 2 vessel CABG 1989; b. echo 2003: nl; c. cath 2006: patent grafts, EF 65%; d. nuclear stress test 2007: no ischemia, EF 81%  . Degenerative disc disease   . Dyspnea   . History of hyperkalemia   . Hyperlipidemia   . Hypertension   . Mitral valve prolapse   . Norovirus 2014  . Osteoporosis   . Sleep apnea   . Spinal stenosis in cervical region   . Stroke [facility]) 1980s   left brain   Past Surgical History:  Procedure Laterality Date  . ABDOMINAL HYSTERECTOMY    . BLADDER SUSPENSION    . CAROTID ENDARTERECTOMY     left  . CHOLECYSTECTOMY    . CORONARY ARTERY BYPASS GRAFT    . HIP SURGERY  02/2011   right   Family History  Problem Relation Age of Onset  . Throat cancer Mother   . Stroke Father    Social History  Socioeconomic History  . Marital status: Widowed    Spouse name: Not on file  . Number of children: 1  . Years of education: Not on file  . Highest education level: Some college, no degree  Occupational History  . Occupation: retired  Scientific laboratory technician  . Financial resource strain: Not hard at all  . Food insecurity    Worry: Never true    Inability: Never true  . Transportation needs    Medical: No    Non-medical: No  Tobacco Use  . Smoking status: Former Smoker    Quit date: 02/22/1979    Years since quitting: 39.8  . Smokeless tobacco: Never Used  . Tobacco comment: quit 50 years ago  Substance and Sexual Activity  . Alcohol use: Yes     Comment: 1 glass of wine rarely  . Drug use: No  . Sexual activity: Never  Lifestyle  . Physical activity    Days per week: 0 days    Minutes per session: 0 min  . Stress: Not at all  Relationships  . Social Herbalist on phone: Patient refused    Gets together: Patient refused    Attends religious service: Patient refused    Active member of club or organization: Patient refused    Attends meetings of clubs or organizations: Patient refused    Relationship status: Patient refused  Other Topics Concern  . Not on file  Social History Narrative  . Not on file    Outpatient Encounter Medications as of 12/24/2018  Medication Sig  . amLODipine (NORVASC) 5 MG tablet Take 2 tablets (10 mg total) by mouth daily.  Marland Kitchen aspirin EC 81 MG tablet Take 81 mg by mouth daily.  Marland Kitchen atorvastatin (LIPITOR) 80 MG tablet Take 1 tablet (80 mg total) by mouth daily.  . Calcium Carbonate-Vit D-Min (CALCIUM 600+D PLUS MINERALS) 600-400 MG-UNIT TABS Take by mouth daily.   . Cholecalciferol (VITAMIN D3) 10000 units TABS Take by mouth.  . fluticasone (FLONASE) 50 MCG/ACT nasal spray Place 2 sprays into both nostrils daily. (Patient taking differently: Place 2 sprays into both nostrils daily. As needed)  . fluticasone furoate-vilanterol (BREO ELLIPTA) 100-25 MCG/INH AEPB Inhale 1 puff into the lungs daily.  Marland Kitchen levothyroxine (SYNTHROID, LEVOTHROID) 75 MCG tablet Take 75 mcg by mouth daily before breakfast.   . LORazepam (ATIVAN) 0.5 MG tablet Take 0.5 mg by mouth at bedtime as needed for anxiety.  . Multiple Vitamins-Minerals (PRESERVISION AREDS PO) Take 1 capsule by mouth daily.   . nitroGLYCERIN (NITROSTAT) 0.4 MG SL tablet Place 1 tablet (0.4 mg total) under the tongue every 5 (five) minutes as needed.  . senna-docusate (SENOKOT-S) 8.6-50 MG tablet Take 2 tablets by mouth at bedtime as needed for mild constipation.  Marland Kitchen tiotropium (SPIRIVA) 18 MCG inhalation capsule Place 18 mcg into inhaler and inhale  daily.  . traMADol (ULTRAM) 50 MG tablet Take 50 mg by mouth every 6 (six) hours as needed.  . vitamin C (ASCORBIC ACID) 500 MG tablet Take 500 mg by mouth daily.  . NONFORMULARY OR COMPOUNDED ITEM [facility]  Peripheral Neuropathy Cream- Bupivacaine 1%, Doxepin 3%, Gabapentin 6%, Pentoxifylline 3%, Topiramate 1% Apply 1-2 grams to affected area 3-4 times daily Qty. 120 gm 3 refills   No facility-administered encounter medications on file as of 12/24/2018.     Activities of Daily Living In your present state of health, do you have any difficulty performing the following activities: 12/24/2018  Hearing? Darreld Mclean  Comment Wears bilateral hearing aids.  Vision? N  Comment Wears eye glasses daily.  Difficulty concentrating or making decisions? N  Walking or climbing stairs? N  Comment Avoids steps completely.  Dressing or bathing? N  Doing errands, shopping? Y  Comment Does not drive.  Preparing Food and eating ? N  Using the Toilet? N  In the past six months, have you accidently leaked urine? N  Do you have problems with loss of bowel control? N  Managing your Medications? N  Managing your Finances? Y  Comment Daughter manages medications.  Housekeeping or managing your Housekeeping? Y  Comment [employer].  Some recent data might be hidden    Patient Care Team: Mar Daring, PA-C as PCP - General (Family Medicine) Rockey Situ, Kathlene November, MD as Consulting Physician (Cardiology) Pa, [facility] as Consulting Physician [facility])    Assessment:   This is a routine wellness examination for [place].  Exercise Activities and Dietary recommendations Current Exercise Habits: The patient does not participate in regular exercise at present, Exercise limited by: orthopedic condition(s)  Goals    . DIET - INCREASE WATER INTAKE     Recommend increasing water intake of 4-6 glasses of water a day.        Fall Risk: Fall Risk  12/24/2018 08/28/2017 09/01/2016  04/30/2015  Falls in the past year? 0 No No No    FALL RISK PREVENTION PERTAINING TO THE HOME:  Any stairs in or around the home? Yes  If so, are there any without handrails? No   Home free of loose throw rugs in walkways, pet beds, electrical cords, etc? Yes  Adequate lighting in your home to reduce risk of falls? Yes   ASSISTIVE DEVICES UTILIZED TO PREVENT FALLS:  Life alert? Yes  Use of a cane, walker or w/c? Yes  Grab bars in the bathroom? Yes  Shower chair or bench in shower? Yes  Elevated toilet seat or a handicapped toilet? Yes    TIMED UP AND GO:  Was the test performed? No .    Depression Screen PHQ 2/9 Scores 12/24/2018 08/28/2017 03/22/2017 09/01/2016  PHQ - 2 Score 0 3 2 2   PHQ- 9 Score - 13 2 12      Cognitive Function: Declined today.         Immunization History  Administered Date(s) Administered  . Influenza Split 03/18/2012  . Influenza Whole 03/27/2017  . Influenza, High Dose Seasonal PF 04/06/2014  . Influenza-Unspecified 03/19/2013, 04/13/2016  . Td 07/26/2007  . Zoster 07/27/2006    Qualifies for Shingles Vaccine? Yes  Zostavax completed 07/27/06. Due for Shingrix. Education has been provided regarding the importance of this vaccine. Pt has been advised to call insurance company to determine out of pocket expense. Advised may also receive vaccine at local pharmacy or Health Dept. Verbalized acceptance and understanding.  Tdap: Although this vaccine is not a covered service during a Wellness Exam, does the patient still wish to receive this vaccine today?  No .  Education has been provided regarding the importance of this vaccine. Advised may receive this vaccine at local pharmacy or Health Dept. Aware to provide a copy of the vaccination record if obtained from local pharmacy or Health Dept. Verbalized acceptance and understanding.  Flu Vaccine: Due fall 2020.  Pneumococcal Vaccine: Due for Pneumococcal vaccine. Does the patient want to receive  this vaccine today?  No . Education has been provided regarding the importance of this vaccine but still declined.  Advised may receive this vaccine at local pharmacy or Health Dept. Aware to provide a copy of the vaccination record if obtained from local pharmacy or Health Dept. Verbalized acceptance and understanding.   Screening Tests Health Maintenance  Topic Date Due  . PNA vac Low Risk Adult (1 of 2 - PCV13) 01/29/1991  . DEXA SCAN  07/18/2017  . TETANUS/TDAP  07/25/2017  . INFLUENZA VACCINE  01/18/2019    Cancer Screenings:  Colorectal Screening: No longer required.   Mammogram: No longer required.   Bone Density: Completed 07/19/15. Results reflect OSTEOPOROSIS. Repeat every 2 years. Pt declined DEXA order today.   Lung Cancer Screening: (Low Dose CT Chest recommended if Age 58-80 years, 30 pack-year currently smoking OR have quit w/in 15years.) does not qualify.   Additional Screening:  Vision Screening: Recommended annual ophthalmology exams for early detection of glaucoma and other disorders of the eye.  Dental Screening: Recommended annual dental exams for proper oral hygiene  Community Resource Referral:  CRR required this visit?  No       Plan:  I have personally reviewed and addressed the Medicare Annual Wellness questionnaire and have noted the following in the patient's chart:  A. Medical and social history B. Use of alcohol, tobacco or illicit drugs  C. Current medications and supplements D. Functional ability and status E.  Nutritional status F.  Physical activity G. Advance directives H. List of other physicians I.  Hospitalizations, surgeries, and ER visits in previous 12 months J.  [place] such as hearing and vision if needed, cognitive and depression L. Referrals and appointments   In addition, I have reviewed and discussed with patient certain preventive protocols, quality metrics, and best practice recommendations. A written  personalized care plan for preventive services as well as general preventive health recommendations were provided to patient. Nurse Health Advisor  Signed,    Tishawn Friedhoff [place], [place]  06/26/15 Nurse Health Advisor   Nurse Notes: Pt declined a DEXA order, tetanus vaccine and pneumonia vaccine future orders today.

## 2018-12-24 NOTE — Patient Instructions (Signed)
Chelsea [name] , Thank you for taking time to come for your Medicare Wellness Visit. I appreciate your ongoing commitment to your health goals. Please review the following plan we discussed and let me know if I can assist you in the future.   Screening recommendations/referrals: Colonoscopy: No longer required.  Mammogram: No longer required.  Bone Density: Pt declined a DEXA referral today.  Recommended yearly ophthalmology/optometry visit for glaucoma screening and checkup Recommended yearly dental visit for hygiene and checkup  Vaccinations: Influenza vaccine: Due fall 2020  Pneumococcal vaccine: Pt declines today.  Tdap vaccine: Pt declines today.  Shingles vaccine: Pt declines today.     Advanced directives: Please bring a copy of your POA (Power of Attorney) and/or Living Will to your next appointment.   Conditions/risks identified: Continue to increase water intake to 6-8 8 oz glasses a day.   Next appointment: Pt declined scheduling a CPE for this year or an AWV for 2021 at this time.    Preventive Care 82 Years and Older, Female Preventive care refers to lifestyle choices and visits with your health care provider that can promote health and wellness. What does preventive care include?  A yearly physical exam. This is also called an annual well check.  Dental exams once or twice a year.  Routine eye exams. Ask your health care provider how often you should have your eyes checked.  Personal lifestyle choices, including:  Daily care of your teeth and gums.  Regular physical activity.  Eating a healthy diet.  Avoiding tobacco and drug use.  Limiting alcohol use.  Practicing safe sex.  Taking low-dose aspirin every day.  Taking vitamin and mineral supplements as recommended by your health care provider. What happens during an annual well check? The services and screenings done by your health care provider during your annual well check will depend on your age,  overall health, lifestyle risk factors, and family history of disease. Counseling  Your health care provider may ask you questions about your:  Alcohol use.  Tobacco use.  Drug use.  Emotional well-being.  Home and relationship well-being.  Sexual activity.  Eating habits.  History of falls.  Memory and ability to understand (cognition).  Work and work Statistician.  Reproductive health. Screening  You may have the following tests or measurements:  Height, weight, and BMI.  Blood pressure.  Lipid and cholesterol levels. These may be checked every 5 years, or more frequently if you are over 78 years old.  Skin check.  Lung cancer screening. You may have this screening every year starting at age 40 if you have a 30-pack-year history of smoking and currently smoke or have quit within the past 15 years.  Fecal occult blood test (FOBT) of the stool. You may have this test every year starting at age 17.  Flexible sigmoidoscopy or colonoscopy. You may have a sigmoidoscopy every 5 years or a colonoscopy every 10 years starting at age 76.  Hepatitis C blood test.  Hepatitis B blood test.  Sexually transmitted disease (STD) testing.  Diabetes screening. This is done by checking your blood sugar (glucose) after you have not eaten for a while (fasting). You may have this done every 1-3 years.  Bone density scan. This is done to screen for osteoporosis. You may have this done starting at age 95.  Mammogram. This may be done every 1-2 years. Talk to your health care provider about how often you should have regular mammograms. Talk with your health care provider  about your test results, treatment options, and if necessary, the need for more tests. Vaccines  Your health care provider may recommend certain vaccines, such as:  Influenza vaccine. This is recommended every year.  Tetanus, diphtheria, and acellular pertussis (Tdap, Td) vaccine. You may need a Td booster every 10  years.  Zoster vaccine. You may need this after age 73.  Pneumococcal 13-valent conjugate (PCV13) vaccine. One dose is recommended after age 51.  Pneumococcal polysaccharide (PPSV23) vaccine. One dose is recommended after age 53. Talk to your health care provider about which screenings and vaccines you need and how often you need them. This information is not intended to replace advice given to you by your health care provider. Make sure you discuss any questions you have with your health care provider. Document Released: 07/02/2015 Document Revised: 02/23/2016 Document Reviewed: 04/06/2015 Elsevier Interactive Patient Education  2017 [place] Prevention in the Home Falls can cause injuries. They can happen to people of all ages. There are many things you can do to make your home safe and to help prevent falls. What can I do on the outside of my home?  Regularly fix the edges of walkways and driveways and fix any cracks.  Remove anything that might make you trip as you walk through a door, such as a raised step or threshold.  Trim any bushes or trees on the path to your home.  Use bright outdoor lighting.  Clear any walking paths of anything that might make someone trip, such as rocks or tools.  Regularly check to see if handrails are loose or broken. Make sure that both sides of any steps have handrails.  Any raised decks and porches should have guardrails on the edges.  Have any leaves, snow, or ice cleared regularly.  Use sand or salt on walking paths during winter.  Clean up any spills in your garage right away. This includes oil or grease spills. What can I do in the bathroom?  Use night lights.  Install grab bars by the toilet and in the tub and shower. Do not use towel bars as grab bars.  Use non-skid mats or decals in the tub or shower.  If you need to sit down in the shower, use a plastic, non-slip stool.  Keep the floor dry. Clean up any water that  spills on the floor as soon as it happens.  Remove soap buildup in the tub or shower regularly.  Attach bath mats securely with double-sided non-slip rug tape.  Do not have throw rugs and other things on the floor that can make you trip. What can I do in the bedroom?  Use night lights.  Make sure that you have a light by your bed that is easy to reach.  Do not use any sheets or blankets that are too big for your bed. They should not hang down onto the floor.  Have a firm chair that has side arms. You can use this for support while you get dressed.  Do not have throw rugs and other things on the floor that can make you trip. What can I do in the kitchen?  Clean up any spills right away.  Avoid walking on wet floors.  Keep items that you use a lot in easy-to-reach places.  If you need to reach something above you, use a strong step stool that has a grab bar.  Keep electrical cords out of the way.  Do not use floor polish or  wax that makes floors slippery. If you must use wax, use non-skid floor wax.  Do not have throw rugs and other things on the floor that can make you trip. What can I do with my stairs?  Do not leave any items on the stairs.  Make sure that there are handrails on both sides of the stairs and use them. Fix handrails that are broken or loose. Make sure that handrails are as long as the stairways.  Check any carpeting to make sure that it is firmly attached to the stairs. Fix any carpet that is loose or worn.  Avoid having throw rugs at the top or bottom of the stairs. If you do have throw rugs, attach them to the floor with carpet tape.  Make sure that you have a light switch at the top of the stairs and the bottom of the stairs. If you do not have them, ask someone to add them for you. What else can I do to help prevent falls?  Wear shoes that:  Do not have high heels.  Have rubber bottoms.  Are comfortable and fit you well.  Are closed at the  toe. Do not wear sandals.  If you use a stepladder:  Make sure that it is fully opened. Do not climb a closed stepladder.  Make sure that both sides of the stepladder are locked into place.  Ask someone to hold it for you, if possible.  Clearly mark and make sure that you can see:  Any grab bars or handrails.  First and last steps.  Where the edge of each step is.  Use tools that help you move around (mobility aids) if they are needed. These include:  Canes.  Walkers.  Scooters.  Crutches.  Turn on the lights when you go into a dark area. Replace any light bulbs as soon as they burn out.  Set up your furniture so you have a clear path. Avoid moving your furniture around.  If any of your floors are uneven, fix them.  If there are any pets around you, be aware of where they are.  Review your medicines with your doctor. Some medicines can make you feel dizzy. This can increase your chance of falling. Ask your doctor what other things that you can do to help prevent falls. This information is not intended to replace advice given to you by your health care provider. Make sure you discuss any questions you have with your health care provider. Document Released: 04/01/2009 Document Revised: 11/11/2015 Document Reviewed: 07/10/2014 Elsevier Interactive Patient Education  2017 [employer].

## 2019-01-07 ENCOUNTER — Other Ambulatory Visit: Payer: Self-pay | Admitting: Physician Assistant

## 2019-01-07 DIAGNOSIS — K5904 Chronic idiopathic constipation: Secondary | ICD-10-CM

## 2019-01-07 MED ORDER — LINACLOTIDE 72 MCG PO CAPS: 72.0000 ug | ORAL_CAPSULE | Freq: Every day | ORAL | 1 refills | Status: DC

## 2019-01-07 NOTE — Telephone Encounter (Signed)
Does she want to try linzess again. That has worked in the past.

## 2019-01-07 NOTE — Addendum Note (Signed)
Addended by: Mar Daring on: 01/07/2019 02:18 PM   Modules accepted: Orders

## 2019-01-07 NOTE — Telephone Encounter (Signed)
Patient agrees with going back on Linzess.

## 2019-01-07 NOTE — Addendum Note (Signed)
Addended by: Mar Daring on: 01/07/2019 02:16 PM   Modules accepted: Orders

## 2019-01-07 NOTE — Telephone Encounter (Signed)
I hate to do this but can we call it in? It wont escribe to her pharmacy on file.

## 2019-01-07 NOTE — Telephone Encounter (Signed)
Pt is having issue with the senna-docusate (SENOKOT-S) 8.6-50 MG tablet.  Pt feels this is not working well enough for her.  Needing something stronger.  Please advise.  Thanks, [employer]

## 2019-01-07 NOTE — Telephone Encounter (Signed)
[facility] order was called in for patient. Pharmacy address had to be changed FYI.

## 2019-01-13 ENCOUNTER — Other Ambulatory Visit: Payer: Self-pay

## 2019-01-14 ENCOUNTER — Encounter: Payer: Self-pay | Admitting: Radiation Oncology

## 2019-01-14 ENCOUNTER — Other Ambulatory Visit: Payer: Self-pay

## 2019-01-14 ENCOUNTER — Other Ambulatory Visit: Payer: Self-pay | Admitting: *Deleted

## 2019-01-14 ENCOUNTER — Ambulatory Visit
Admission: RE | Admit: 2019-01-14 | Discharge: 2019-01-14 | Disposition: A | Discharge status: Home / Self Care | Payer: Medicare Other | Source: Ambulatory Visit | Attending: Radiation Oncology | Admitting: Radiation Oncology

## 2019-01-14 VITALS — BP 114/59 | HR 75 | Temp 98.1°F

## 2019-01-14 DIAGNOSIS — Z87891 Personal history of nicotine dependence: Secondary | ICD-10-CM | POA: Diagnosis not present

## 2019-01-14 DIAGNOSIS — Z923 Personal history of irradiation: Secondary | ICD-10-CM | POA: Diagnosis not present

## 2019-01-14 DIAGNOSIS — B488 Other specified mycoses: Secondary | ICD-10-CM | POA: Diagnosis not present

## 2019-01-14 DIAGNOSIS — C07 Malignant neoplasm of parotid gland: Secondary | ICD-10-CM

## 2019-01-14 DIAGNOSIS — Z8589 Personal history of malignant neoplasm of other organs and systems: Secondary | ICD-10-CM | POA: Insufficient documentation

## 2019-01-14 MED ORDER — FLUCONAZOLE 100 MG PO TABS: 100.0000 mg | ORAL_TABLET | Freq: Every day | ORAL | 0 refills | Status: DC

## 2019-01-14 NOTE — Progress Notes (Signed)
Radiation Oncology Follow up Note  Name: Chelsea [name]   Date:   01/14/2019 MRN:  [id number] DOB: [birth year]    This 85 y.o. 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  Biopsy was positive for malignant neoplasm with necrosis and hemorrhage.  She is seen today in routine follow-up her main complaint is some irritation around her lips which I attribute to a fungal infection of the canthus.  She specifically denies any dysphasia or head and neck pain..  COMPLICATIONS OF TREATMENT: none  FOLLOW UP COMPLIANCE: keeps appointments   PHYSICAL EXAM:  BP (!) 114/59 (BP Location: Left Arm, Patient Position: Sitting)   Pulse 75   Temp 98.1 F (36.7 C)  Oral cavity is clear no evidence of mass or nodularity in either parotid bed is noted no cervical or supraclavicular adenopathy is appreciated.  Some mild perioral fungal infection is seen.  Well-developed well-nourished patient in NAD. HEENT reveals PERLA, EOMI, discs not visualized.  Oral cavity is clear. No oral mucosal lesions are identified. Neck is clear without evidence of cervical or supraclavicular adenopathy. Lungs are clear to A&P. Cardiac examination is essentially unremarkable with regular rate and rhythm without murmur rub or thrill. Abdomen is benign with no organomegaly or masses noted. Motor sensory and DTR levels are equal and symmetric in the upper and lower extremities. Cranial nerves II through XII are grossly intact. Proprioception is intact. No peripheral adenopathy or edema is identified. No motor or sensory levels are noted. Crude visual fields are within normal range.  RADIOLOGY RESULTS:  No current films for review  PLAN: Present time patient is doing well with no evidence of disease she is had a complete response in her parotid gland.  I am starting her on Diflucan for 14 days for the perioral fungus.  Otherwise I am pleased with her overall progress asked to see her back in 6 months for follow-up.  Patient knows to call with any concerns.  I would like to take this opportunity to thank you for allowing me to participate in the care of your patient.Noreene Filbert, MD

## 2019-01-17 ENCOUNTER — Telehealth (INDEPENDENT_AMBULATORY_CARE_PROVIDER_SITE_OTHER): Payer: Medicare Other | Admitting: Physician Assistant

## 2019-01-17 DIAGNOSIS — R399 Unspecified symptoms and signs involving the genitourinary system: Secondary | ICD-10-CM

## 2019-01-17 LAB — POCT URINALYSIS DIPSTICK
Bilirubin, UA: NEGATIVE
Blood, UA: NEGATIVE
Glucose, UA: NEGATIVE
Ketones, UA: NEGATIVE
Leukocytes, UA: NEGATIVE
Nitrite, UA: NEGATIVE
Odor: NEGATIVE
Protein, UA: NEGATIVE
Spec Grav, UA: 1.015 (ref 1.010–1.025)
Urobilinogen, UA: 0.2 E.U./dL
pH, UA: 6 (ref 5.0–8.0)

## 2019-01-17 NOTE — Telephone Encounter (Signed)
LM for nurse with [facility] that urine is normal and that we are going to send the urine for culture just to see if it grows bacteria.If it does we will call them and to let them know and send an antibiotic.

## 2019-01-17 NOTE — Telephone Encounter (Signed)
Crystal at [facility] called wanting to know if we have gotten a result on pt's urine.  The nurse has to leave ASAP and needs to know the results so she can have her treated before she leaves.  If positive she is wanting to know if we are sending something to her pharmacy.  [facility]  [phone]  CB#  [phone]  Thanks Con Memos

## 2019-01-17 NOTE — Telephone Encounter (Signed)
Yes this is ok. Lab ordered in this phone note

## 2019-01-17 NOTE — Telephone Encounter (Signed)
Chelsea [name] with [facility] called saying patient is having some confusion and they want to know if they can get a clean catch on her for possible UTI .  No other symptoms  CB#  [phone]  Thanks  Con Memos

## 2019-01-17 NOTE — Telephone Encounter (Signed)
Crystal Madalyn Rob RN with [facility] called saying that they do not have a lab on sight today.  Can they get a clean catch and bring to our office to see if she has a UTI  CB#  [phone] [facility]

## 2019-01-17 NOTE — Telephone Encounter (Signed)
Urine normal, do you want me to send it for Culture?

## 2019-01-19 LAB — URINE CULTURE

## 2019-01-20 ENCOUNTER — Telehealth: Payer: Self-pay

## 2019-01-20 NOTE — Telephone Encounter (Signed)
-----   Message from Mar Daring, PA-C sent at 01/20/2019  8:26 AM EDT ----- Urine culture was negative. No bacterial growth

## 2019-01-20 NOTE — Telephone Encounter (Signed)
Spoke Lar[place] from [facility] and gave patient's urine results.

## 2019-01-20 NOTE — Telephone Encounter (Signed)
Advised on negative urine culture for patient.

## 2019-01-21 DIAGNOSIS — M5416 Radiculopathy, lumbar region: Secondary | ICD-10-CM | POA: Diagnosis not present

## 2019-01-21 DIAGNOSIS — M5136 Other intervertebral disc degeneration, lumbar region: Secondary | ICD-10-CM | POA: Diagnosis not present

## 2019-01-21 DIAGNOSIS — M48062 Spinal stenosis, lumbar region with neurogenic claudication: Secondary | ICD-10-CM | POA: Diagnosis not present

## 2019-02-05 DIAGNOSIS — E039 Hypothyroidism, unspecified: Secondary | ICD-10-CM | POA: Diagnosis not present

## 2019-02-05 DIAGNOSIS — E559 Vitamin D deficiency, unspecified: Secondary | ICD-10-CM | POA: Diagnosis not present

## 2019-02-05 DIAGNOSIS — M810 Age-related osteoporosis without current pathological fracture: Secondary | ICD-10-CM | POA: Diagnosis not present

## 2019-02-10 DIAGNOSIS — Z5181 Encounter for therapeutic drug level monitoring: Secondary | ICD-10-CM | POA: Diagnosis not present

## 2019-02-10 DIAGNOSIS — E559 Vitamin D deficiency, unspecified: Secondary | ICD-10-CM | POA: Diagnosis not present

## 2019-02-10 DIAGNOSIS — R1031 Right lower quadrant pain: Secondary | ICD-10-CM | POA: Diagnosis not present

## 2019-02-10 DIAGNOSIS — K5909 Other constipation: Secondary | ICD-10-CM | POA: Diagnosis not present

## 2019-02-10 DIAGNOSIS — E039 Hypothyroidism, unspecified: Secondary | ICD-10-CM | POA: Diagnosis not present

## 2019-02-10 DIAGNOSIS — R1032 Left lower quadrant pain: Secondary | ICD-10-CM | POA: Diagnosis not present

## 2019-02-10 DIAGNOSIS — M810 Age-related osteoporosis without current pathological fracture: Secondary | ICD-10-CM | POA: Diagnosis not present

## 2019-02-19 ENCOUNTER — Ambulatory Visit (INDEPENDENT_AMBULATORY_CARE_PROVIDER_SITE_OTHER): Payer: Medicare Other | Admitting: Physician Assistant

## 2019-02-19 DIAGNOSIS — K5901 Slow transit constipation: Secondary | ICD-10-CM

## 2019-02-19 DIAGNOSIS — R197 Diarrhea, unspecified: Secondary | ICD-10-CM | POA: Diagnosis not present

## 2019-02-19 NOTE — Progress Notes (Signed)
Subjective:    Patient ID: Chelsea [name], female    DOB: 05/07/1924, 85 y.o.   MRN: [id number]  Chelsea [name] is a 85 y.o. female presenting on 02/19/2019 for Diarrhea and Abdominal Pain  Virtual Visit via Telephone Note  I connected with Chelsea [name] on 02/19/19 at  3:20 PM EDT by telephone and verified that I am speaking with the correct person using two identifiers.   I discussed the limitations, risks, security and privacy concerns of performing an evaluation and management service by telephone and the availability of in person appointments. I also discussed with the patient that there may be a patient responsible charge related to this service. The patient expressed understanding and agreed to proceed.  Patient location: home Provider location: [place] office  Persons involved in the visit: patient, provider   HPI   Patient is a 85 y/o woman with history of severe constipation that have in the past required hospitalization. She has been seen by her GI provider on 02/10/2019 at [facility]. She has a longstanding history of multiple medication failures.   Her history is listed below from [place] most recent note:   Chelsea [name] is a 85 y.o.female with a long-standing history of chronic constipation and chronic RLQ pain. She has been prescribed chronic narcotics for chronic right hip pain for many years. The RLQ pain, worse when leaning forward. It lasts for 5-10 seconds before subsiding, and feels better with pressure on the area. The pain is urelated to meals, but she was not sure if it is affected by BMs. Historical has included a unremarkable abdominal xray (03/31/16), CT a/p w/ contrast (01/2015), and repeat CT abd/pelvis in the ED (12/21/16). Remaining active, drinking water, and taking Linaclotoide 168mcg BID initially provided relief of constipation, but the response was not maintained. She also tried fiber supplementation, Miralax,  Senokot, OTC stool softeners, OTC laxatives, Linactolide 140mcg daily, and Lubiprostone 82mcg BID without finding an appropriate bowel regimen. Most recently, Movantik 12.24mcg and 64mcg daily were also unhelpful. She was taking 1 tab Senokot-S every 3-4 days w/ immediate relief; 2 tabs caused diarrhea. She also found relief by eating strawberries BID, increasing daily intake of soluble fiber, adequate hydration with water, continue OTC Senna daily prn, and heating pad/acetaminophen prn for RLQ pain (likely due to MSK pain or adhesions).    On 02/10/2019 she was instructed to take a 328 gm bottle of miralax in 64 ounces of gatorade, which she did though she does not remember the date. Her linzess was also increased from 145 mcg daily to 290 mcg daily. She believes she is taking this, unsure as her pills come pre-packaged.   She reports that she had two explosive episodes of diarrhea the other day and fecal incontinence. She denies fevers or chills. She denies antibiotics within the past 3 months. Reports pain in her left side above her waist. Denies vomiting. She had some food yesterday and a small amount of water today. She denies blood in her stool. She denies urinary issues.   Social History   Tobacco Use  . Smoking status: Former Smoker    Quit date: 02/22/1979    Years since quitting: 40.0  . Smokeless tobacco: Never Used  . Tobacco comment: quit 50 years ago  Substance Use Topics  . Alcohol use: Yes    Comment: 1 glass of wine rarely  . Drug use: No    Review of Systems Per HPI unless specifically  indicated above     Objective:    There were no vitals taken for this visit.  Wt Readings from Last 3 Encounters:  08/22/18 112 lb 9.6 oz (51.1 kg)  07/04/18 114 lb 1.4 oz (51.8 kg)  06/25/18 113 lb (51.3 kg)    Physical Exam Pulmonary:     Effort: No respiratory distress.     Comments: Patient talking in complete sentences without pause.    Results for orders placed or performed in  visit on 01/17/19  Urine Culture   Specimen: Urine   UR  Result Value Ref Range   Urine Culture, Routine Final report    Organism ID, Bacteria Comment   POCT Urinalysis Dipstick  Result Value Ref Range   Color, UA Yellow    Clarity, UA Clear    Glucose, UA Negative Negative   Bilirubin, UA Negative    Ketones, UA Negative    Spec Grav, UA 1.015 1.010 - 1.025   Blood, UA Negative    pH, UA 6.0 5.0 - 8.0   Protein, UA Negative Negative   Urobilinogen, UA 0.2 0.2 or 1.0 E.U./dL   Nitrite, UA Negative    Leukocytes, UA Negative Negative   Appearance     Odor Negative       Assessment & Plan:  1. Slow transit constipation  Longstanding history of constipation, is followed by [name]. On 02/10/2019 was prescribed miralax clean out and also increased dose of Linzess to 290 mcg daily. I suspect her diarrhea is related to these medications. I have given her two option: since she has completed the miralax clean out, she may remain on 290 mcg linzess daily to see if her symptoms will level off. Or, she may take Linzess 145 mcg daily and follow up with her GI doctor. I think her abdominal pain is also medication related.   Of note, patient did not have her hearing aids in when she was talking on the phone. She had difficulty hearing me and I had to repeat myself many times. She also did not know what her medications looked like so I described the Linzess pill to her. She seemed somewhat confused but agreeable to calling back if symptoms worsen.   2. Diarrhea, unspecified type  See above.  Follow up plan: No follow-ups on file.  Carles Collet, PA-C [facility] 02/19/2019, 3:20 PM

## 2019-02-25 DIAGNOSIS — M810 Age-related osteoporosis without current pathological fracture: Secondary | ICD-10-CM | POA: Diagnosis not present

## 2019-02-27 ENCOUNTER — Ambulatory Visit: Payer: Medicare Other | Admitting: Podiatry

## 2019-03-02 ENCOUNTER — Emergency Department: Payer: Medicare Other

## 2019-03-02 ENCOUNTER — Emergency Department
Admission: EM | Admit: 2019-03-02 | Discharge: 2019-03-02 | Disposition: A | Discharge status: Home / Self Care | Payer: Medicare Other | Attending: Student | Admitting: Student

## 2019-03-02 ENCOUNTER — Other Ambulatory Visit: Payer: Self-pay

## 2019-03-02 ENCOUNTER — Encounter: Payer: Self-pay | Admitting: Emergency Medicine

## 2019-03-02 DIAGNOSIS — K5900 Constipation, unspecified: Secondary | ICD-10-CM | POA: Diagnosis not present

## 2019-03-02 DIAGNOSIS — E039 Hypothyroidism, unspecified: Secondary | ICD-10-CM | POA: Insufficient documentation

## 2019-03-02 DIAGNOSIS — I10 Essential (primary) hypertension: Secondary | ICD-10-CM | POA: Diagnosis not present

## 2019-03-02 DIAGNOSIS — J449 Chronic obstructive pulmonary disease, unspecified: Secondary | ICD-10-CM | POA: Diagnosis not present

## 2019-03-02 DIAGNOSIS — Z79899 Other long term (current) drug therapy: Secondary | ICD-10-CM | POA: Diagnosis not present

## 2019-03-02 DIAGNOSIS — R1084 Generalized abdominal pain: Secondary | ICD-10-CM | POA: Diagnosis not present

## 2019-03-02 DIAGNOSIS — K5909 Other constipation: Secondary | ICD-10-CM | POA: Diagnosis not present

## 2019-03-02 DIAGNOSIS — R52 Pain, unspecified: Secondary | ICD-10-CM | POA: Diagnosis not present

## 2019-03-02 DIAGNOSIS — R001 Bradycardia, unspecified: Secondary | ICD-10-CM | POA: Diagnosis not present

## 2019-03-02 DIAGNOSIS — Z87891 Personal history of nicotine dependence: Secondary | ICD-10-CM | POA: Insufficient documentation

## 2019-03-02 DIAGNOSIS — I25810 Atherosclerosis of coronary artery bypass graft(s) without angina pectoris: Secondary | ICD-10-CM | POA: Insufficient documentation

## 2019-03-02 DIAGNOSIS — I959 Hypotension, unspecified: Secondary | ICD-10-CM | POA: Diagnosis not present

## 2019-03-02 DIAGNOSIS — Z7982 Long term (current) use of aspirin: Secondary | ICD-10-CM | POA: Diagnosis not present

## 2019-03-02 LAB — BASIC METABOLIC PANEL
Anion gap: 11 (ref 5–15)
BUN: 31 mg/dL — ABNORMAL HIGH (ref 8–23)
CO2: 24 mmol/L (ref 22–32)
Calcium: 8.9 mg/dL (ref 8.9–10.3)
Chloride: 103 mmol/L (ref 98–111)
Creatinine, Ser: 1.15 mg/dL — ABNORMAL HIGH (ref 0.44–1.00)
GFR calc Af Amer: 47 mL/min — ABNORMAL LOW (ref >60)
GFR calc non Af Amer: 41 mL/min — ABNORMAL LOW (ref >60)
Glucose, Bld: 105 mg/dL — ABNORMAL HIGH (ref 70–99)
Potassium: 4.5 mmol/L (ref 3.5–5.1)
Sodium: 138 mmol/L (ref 135–145)

## 2019-03-02 LAB — MAGNESIUM: Magnesium: 2.4 mg/dL (ref 1.7–2.4)

## 2019-03-02 MED ORDER — MAGNESIUM CITRATE PO SOLN
1.0000 | Freq: Once | ORAL | Status: AC
  Administered 2019-03-02: 0.5 via ORAL
  Filled 2019-03-02: qty 296

## 2019-03-02 MED ORDER — MINERAL OIL RE ENEM
1.0000 | ENEMA | Freq: Once | RECTAL | Status: AC | PRN
  Administered 2019-03-02: 1 via RECTAL

## 2019-03-02 NOTE — ED Triage Notes (Signed)
Pt arrives with complaints of constipation for the last 2 days with no relief from pt's "normal regimen."

## 2019-03-02 NOTE — ED Notes (Signed)
Pt up to toilet at this time. Pt passed what appeared to be the mineral oil and a very small BM.

## 2019-03-02 NOTE — ED Notes (Signed)
Pt daughter updated regarding pt d/c and transportation.

## 2019-03-02 NOTE — ED Provider Notes (Signed)
[facility] Emergency Department Provider Note  ____________________________________________   First MD Initiated Contact with Patient 03/02/19 1307     (approximate)  I have reviewed the triage vital signs and the nursing notes.  History  Chief Complaint Constipation    HPI Chelsea [name] is a 85 y.o. female with history as below, including chronic constipation followed by [facility], who presents to the emergency department for constipation.  Patient states she has not had a bowel movement for the past 2 days.  She states she tried taking a capful of MiraLAX each day, without improvement.  On chart review, she was last seen in the GI clinic on 8/24, and given a bowel cleanout, as well as started on Linzess daily.  She is not certain, but thinks she has been taking the Linzess daily.  She cannot remember if she had good response to the bowel cleanout.  She denies any fevers, nausea, vomiting, abdominal pain.  She states she is just frustrated because she is unable to have a bowel movement.         Past Medical Hx Past Medical History:  Diagnosis Date  . Asthma   . Bradycardia    hx of it and fatigue with beta blockade  . Carotid artery disease ([place])    s/p carotid endarterectomy  . Coronary artery disease    a. s/p 2 vessel CABG 1989; b. echo 2003: nl; c. cath 2006: patent grafts, EF 65%; d. nuclear stress test 2007: no ischemia, EF 81%  . Degenerative disc disease   . Dyspnea   . History of hyperkalemia   . Hyperlipidemia   . Hypertension   . Mitral valve prolapse   . Norovirus 2014  . Osteoporosis   . Sleep apnea   . Spinal stenosis in cervical region   . Stroke [facility]) 1980s   left brain    Problem List Patient Active Problem List   Diagnosis Date Noted  . 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 Acute lower UTI 09/24/2017  . UTI (urinary tract infection) 09/24/2017  . Vitamin D deficiency 03/08/2017  . Slow transit constipation  02/22/2017  . Low BP 11/11/2015  . Polyarthritis 08/05/2015  . Pulmonary fibrosis ([place]) 05/11/2015  . SOB (shortness of breath)   . Weakness   . 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 PAD (peripheral artery disease) ([place]) 02/09/2015  . 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 OP (osteoporosis) 02/09/2015  . Billowing mitral valve 02/09/2015  . Degeneration macular 02/09/2015  . Hypercholesteremia 02/09/2015  . Adult hypothyroidism 02/09/2015  . Cannot sleep 02/09/2015  . 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 Presence of aortocoronary bypass graft 02/09/2015  . 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 Esophagitis, reflux 02/09/2015  . Diverticulitis 02/09/2015  . Colon, diverticulosis 02/09/2015  . Constipation due to opioid therapy 02/09/2015  . CAD in native artery 02/09/2015  . Colitis 02/09/2015  . Allergic rhinitis 02/09/2015  . Back ache 02/09/2015  . COPD (chronic obstructive pulmonary disease) ([place]) 01/15/2015  . GERD (gastroesophageal reflux disease) 12/23/2014  . Spinal stenosis at L4-L5 level 12/16/2014  . Lumbar nerve root compression 12/16/2014  . Compression fracture of lumbar vertebra ([place]) 12/16/2014  . Lumbar canal stenosis 11/04/2013  . Neuritis or radiculitis due to rupture of lumbar intervertebral disc 11/04/2013  . DDD (degenerative disc disease), lumbar 11/04/2013  . Degeneration of intervertebral disc of lumbar region 11/04/2013  . Dermatophytosis of nail 03/31/2013  . Pain in limb 03/31/2013  . HTN (hypertension) 07/24/2011  .  Hyperlipidemia 01/31/2010  . Carotid artery stenosis 12/21/2009  . 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 Dyspnea on exertion 05/25/2009    Past Surgical Hx Past Surgical History:  Procedure Laterality Date  . ABDOMINAL HYSTERECTOMY    . BLADDER SUSPENSION    . CAROTID ENDARTERECTOMY     left  . CHOLECYSTECTOMY    . CORONARY ARTERY BYPASS GRAFT    . HIP SURGERY   02/2011   right    Medications Prior to Admission medications   Medication Sig Start Date End Date Taking? Authorizing Provider  amLODipine (NORVASC) 5 MG tablet Take 2 tablets (10 mg total) by mouth daily. Patient taking differently: Take 5 mg by mouth 2 (two) times daily.  12/06/17  Yes Mar Daring, PA-C  aspirin EC 81 MG tablet Take 81 mg by mouth daily.   Yes [provider]  atorvastatin (LIPITOR) 80 MG tablet Take 1 tablet (80 mg total) by mouth daily. 03/15/17  Yes Gollan, Kathlene November, MD  Calcium Carbonate-Vit D-Min (CALCIUM 600+D PLUS MINERALS) 600-400 MG-UNIT TABS Take by mouth daily.    Yes [provider]  Cholecalciferol (VITAMIN D3) 10000 units TABS Take 1,000 Units by mouth daily.    Yes [provider]  fluticasone furoate-vilanterol (BREO ELLIPTA) 100-25 MCG/INH AEPB Inhale 1 puff into the lungs daily.   Yes [provider]  ibuprofen (ADVIL) 200 MG tablet Take 200 mg by mouth every 6 (six) hours as needed.   Yes [provider]  levothyroxine (SYNTHROID, LEVOTHROID) 75 MCG tablet Take 75 mcg by mouth daily before breakfast.  04/19/18  Yes [provider]  linaclotide (LINZESS) 290 MCG CAPS capsule Take 290 mcg by mouth daily before breakfast.   Yes [provider]  LORazepam (ATIVAN) 0.5 MG tablet Take 0.5 mg by mouth at bedtime as needed for anxiety.   Yes [provider]  Multiple Vitamins-Minerals (PRESERVISION AREDS PO) Take 1 capsule by mouth daily.    Yes [provider]  tiotropium (SPIRIVA) 18 MCG inhalation capsule Place 18 mcg into inhaler and inhale daily.   Yes [provider]  vitamin C (ASCORBIC ACID) 500 MG tablet Take 500 mg by mouth daily.   Yes [provider]  fluconazole (DIFLUCAN) 100 MG tablet Take 1 tablet (100 mg total) by mouth daily. Patient not taking: Reported on 03/02/2019 01/14/19   Noreene Filbert, MD  fluticasone [facility]) 50 MCG/ACT nasal spray  Place 2 sprays into both nostrils daily. Patient taking differently: Place 2 sprays into both nostrils daily. As needed 08/22/18   Virginia Crews, MD  linaclotide Rolan Lipa) 72 MCG capsule Take 1 capsule (72 mcg total) by mouth daily before breakfast. Patient not taking: Reported on 03/02/2019 01/07/19   Mar Daring, PA-C  nitroGLYCERIN (NITROSTAT) 0.4 MG SL tablet Place 1 tablet (0.4 mg total) under the tongue every 5 (five) minutes as needed. 02/07/16   Minna Merritts, MD  NONFORMULARY OR COMPOUNDED ITEM [facility]  Peripheral Neuropathy Cream- Bupivacaine 1%, Doxepin 3%, Gabapentin 6%, Pentoxifylline 3%, Topiramate 1% Apply 1-2 grams to affected area 3-4 times daily Qty. 120 gm 3 refills    [provider]  senna-docusate (SENOKOT-S) 8.6-50 MG tablet Take 2 tablets by mouth at bedtime as needed for mild constipation. 06/25/18   Mar Daring, PA-C  traMADol (ULTRAM) 50 MG tablet Take 50 mg by mouth every 6 (six) hours as needed.    [provider]    Allergies Sulfa antibiotics and Sulfonamide derivatives  Family Hx Family History  Problem Relation Age of Onset  . Throat cancer Mother   . Stroke Father     Social Hx Social History   Tobacco Use  . Smoking status: Former Smoker    Quit date: 02/22/1979    Years since quitting: 40.0  . Smokeless tobacco: Never Used  . Tobacco comment: quit 50 years ago  Substance Use Topics  . Alcohol use: Yes    Comment: 1 glass of wine rarely  . Drug use: No     Review of Systems  Constitutional: Negative for fever. Negative for chills. Eyes: Negative for visual changes. ENT: Negative for sore throat. Cardiovascular: Negative for chest pain. Respiratory: Negative for shortness of breath. Gastrointestinal: Negative for abdominal pain. Negative for nausea. Negative for vomiting. + constipation Genitourinary: Negative for dysuria. Musculoskeletal: Negative for leg swelling. Skin: Negative for  rash. Neurological: Negative for for headaches.   Physical Exam  Vital Signs: ED Triage Vitals  Enc Vitals Group     BP 03/02/19 1223 119/82     Pulse Rate 03/02/19 1223 78     Resp 03/02/19 1223 18     Temp 03/02/19 1223 98.2 F (36.8 C)     Temp Source 03/02/19 1223 Oral     SpO2 03/02/19 1223 99 %     Weight 03/02/19 1224 114 lb 10.2 oz (52 kg)     Height 03/02/19 1224 5\' 5"  (1.651 m)     Head Circumference --      Peak Flow --      Pain Score 03/02/19 1223 8     Pain Loc --      Pain Edu? --      Excl. in [place]? --     Constitutional: Alert and oriented.  Eyes: Conjunctivae clear. Sclera anicteric. Head: Normocephalic. Atraumatic. Nose: No congestion. No rhinorrhea. Mouth/Throat: Mucous membranes are moist.  Neck: No stridor.   Cardiovascular: Normal rate, regular rhythm. No murmurs. Extremities well perfused. Respiratory: Normal respiratory effort.  Lungs CTAB. Gastrointestinal: Soft and non-tender throughout. No distention.  Patient declines rectal exam. Musculoskeletal: No lower extremity edema. Neurologic:  Normal speech and language. No gross focal neurologic deficits are appreciated.  Skin: Skin is warm, dry and intact. No rash noted. Psychiatric: Mood and affect are appropriate for situation.  EKG  N/A   Radiology  XR abdomen: IMPRESSION: Nonobstructive bowel gas pattern.   Procedures  Procedure(s) performed (including critical care):  Procedures   Initial Impression / Assessment and Plan / ED Course  85 y.o. female with history of chronic constipation who presents to the ED for constipation, no BM x 2 days, as above.   Presentation most consistent with her known, chronic constipation.  No nausea, vomiting, abdominal tension or pain suggestive of obstruction.  We will obtain basic labs, abdomen x-ray, plan for mag citrate and enema and reassess.  Electrolytes within normal limits.  Nonobstructive bowel gas pattern on x-ray.  She has a very  slightly elevated creatinine, updated patient on these results and advised more adequate hydration at home, as dehydration can cause and worsening constipation.  After magnesium citrate and enema, she had a very small bowel movement.  I do not see a large rectal stool ball on x-ray, but did offer the patient a rectal exam for potential evacuation, however she declines this.  Discussed repeating another bowel prep type of treatment, with MiraLAX and Gatorade.  Patient  is agreeable to this, and will try this at home.  She desires discharge at this time.  We will plan for discharge with instructions for bowel cleanout, and advised GI follow-up.  Advise adequate hydration, healthy diet, and adequate exercise as well.  Given return precautions    Final Clinical Impression(s) / ED Diagnosis  Final diagnoses:  Chronic constipation       Note:  This document was prepared using Dragon voice recognition software and may include unintentional dictation errors.   Lilia Pro., MD 03/02/19 [phone]

## 2019-03-02 NOTE — Discharge Instructions (Addendum)
YOU WILL NEED TO PURCHASE AT THE PHARMACY:  1. 238 gram (8.3 oz) bottle of Miralax (over the counter)  2. 64 ounce bottle of sugar free Gatorade   3. Mix the bottle of Miralax with the bottle of Gatorade in a separate container.   4. Drink an 8 oz glass of the Miralax/Gatorade solution every 30 minutes until HALF of the solution is gone.   Please continue to take any regular, prescribed medications. Please stay well-hydrated, maintain a regular exercise regimen, and continue to eat healthy.  All of these will help with your constipation.  Please follow up with: -  Your GI doctor to review your ER visit and follow-up on your symptoms.  Please return to the ER for any new or worsening symptoms.

## 2019-03-02 NOTE — ED Notes (Signed)
Karle Starch, pt neighbor, contacted via phone and states she is coming to transport pt back to pt residence.

## 2019-03-03 ENCOUNTER — Emergency Department: Payer: Medicare Other

## 2019-03-03 ENCOUNTER — Encounter: Payer: Self-pay | Admitting: Emergency Medicine

## 2019-03-03 ENCOUNTER — Emergency Department
Admission: EM | Admit: 2019-03-03 | Discharge: 2019-03-03 | Disposition: A | Discharge status: Home / Self Care | Payer: Medicare Other | Attending: Emergency Medicine | Admitting: Emergency Medicine

## 2019-03-03 ENCOUNTER — Other Ambulatory Visit: Payer: Self-pay

## 2019-03-03 DIAGNOSIS — J449 Chronic obstructive pulmonary disease, unspecified: Secondary | ICD-10-CM | POA: Diagnosis not present

## 2019-03-03 DIAGNOSIS — K5900 Constipation, unspecified: Secondary | ICD-10-CM | POA: Diagnosis not present

## 2019-03-03 DIAGNOSIS — I25810 Atherosclerosis of coronary artery bypass graft(s) without angina pectoris: Secondary | ICD-10-CM | POA: Diagnosis not present

## 2019-03-03 DIAGNOSIS — Z87891 Personal history of nicotine dependence: Secondary | ICD-10-CM | POA: Insufficient documentation

## 2019-03-03 DIAGNOSIS — J80 Acute respiratory distress syndrome: Secondary | ICD-10-CM | POA: Diagnosis not present

## 2019-03-03 DIAGNOSIS — Z20828 Contact with and (suspected) exposure to other viral communicable diseases: Secondary | ICD-10-CM | POA: Insufficient documentation

## 2019-03-03 DIAGNOSIS — R0600 Dyspnea, unspecified: Secondary | ICD-10-CM | POA: Diagnosis not present

## 2019-03-03 DIAGNOSIS — I10 Essential (primary) hypertension: Secondary | ICD-10-CM | POA: Diagnosis not present

## 2019-03-03 DIAGNOSIS — R0602 Shortness of breath: Secondary | ICD-10-CM | POA: Diagnosis not present

## 2019-03-03 LAB — SARS CORONAVIRUS 2 BY RT PCR (HOSPITAL ORDER, PERFORMED IN ~~LOC~~ HOSPITAL LAB): SARS Coronavirus 2: NEGATIVE

## 2019-03-03 LAB — CBC WITH DIFFERENTIAL/PLATELET
Abs Immature Granulocytes: 0.02 10*3/uL (ref 0.00–0.07)
Basophils Absolute: 0 10*3/uL (ref 0.0–0.1)
Basophils Relative: 0 %
Eosinophils Absolute: 0.2 10*3/uL (ref 0.0–0.5)
Eosinophils Relative: 3 %
HCT: 33.9 % — ABNORMAL LOW (ref 36.0–46.0)
Hemoglobin: 10.9 g/dL — ABNORMAL LOW (ref 12.0–15.0)
Immature Granulocytes: 0 %
Lymphocytes Relative: 17 %
Lymphs Abs: 1.1 10*3/uL (ref 0.7–4.0)
MCH: 29.6 pg (ref 26.0–34.0)
MCHC: 32.2 g/dL (ref 30.0–36.0)
MCV: 92.1 fL (ref 80.0–100.0)
Monocytes Absolute: 0.5 10*3/uL (ref 0.1–1.0)
Monocytes Relative: 7 %
Neutro Abs: 5 10*3/uL (ref 1.7–7.7)
Neutrophils Relative %: 73 %
Platelets: 277 10*3/uL (ref 150–400)
RBC: 3.68 MIL/uL — ABNORMAL LOW (ref 3.87–5.11)
RDW: 14.5 % (ref 11.5–15.5)
WBC: 6.9 10*3/uL (ref 4.0–10.5)
nRBC: 0 % (ref 0.0–0.2)

## 2019-03-03 LAB — BASIC METABOLIC PANEL
Anion gap: 10 (ref 5–15)
BUN: 29 mg/dL — ABNORMAL HIGH (ref 8–23)
CO2: 27 mmol/L (ref 22–32)
Calcium: 9 mg/dL (ref 8.9–10.3)
Chloride: 103 mmol/L (ref 98–111)
Creatinine, Ser: 1.13 mg/dL — ABNORMAL HIGH (ref 0.44–1.00)
GFR calc Af Amer: 49 mL/min — ABNORMAL LOW (ref >60)
GFR calc non Af Amer: 42 mL/min — ABNORMAL LOW (ref >60)
Glucose, Bld: 106 mg/dL — ABNORMAL HIGH (ref 70–99)
Potassium: 4.2 mmol/L (ref 3.5–5.1)
Sodium: 140 mmol/L (ref 135–145)

## 2019-03-03 LAB — BRAIN NATRIURETIC PEPTIDE: B Natriuretic Peptide: 117 pg/mL — ABNORMAL HIGH (ref 0.0–100.0)

## 2019-03-03 LAB — TROPONIN I (HIGH SENSITIVITY): Troponin I (High Sensitivity): 10 ng/L (ref <18)

## 2019-03-03 NOTE — ED Provider Notes (Signed)
[facility] Emergency Department Provider Note       Time seen: ----------------------------------------- 10:02 AM on 03/03/2019 -----------------------------------------   I have reviewed the triage vital signs and the nursing notes.  HISTORY   Chief Complaint Shortness of Breath   HPI Chelsea [name] is a 85 y.o. female with a history of asthma, bradycardia, coronary disease, dyspnea, hyperkalemia, hyperlipidemia, hypertension who presents to the ED for shortness of breath.  Patient arrives from [facility] for same.  She was seen here yesterday for constipation he did not have any shortness of breath at that time.  Patient does report shortness of breath from time to time and she was placed on oxygen prehospital which seemed to help by nasal cannula.  She Did complain of some chest tightness that was relieved with the nasal cannula oxygen as well.  She has still not had a bowel movement as well.  Past Medical History:  Diagnosis Date  . Asthma   . Bradycardia    hx of it and fatigue with beta blockade  . Carotid artery disease ([place])    s/p carotid endarterectomy  . Coronary artery disease    a. s/p 2 vessel CABG 1989; b. echo 2003: nl; c. cath 2006: patent grafts, EF 65%; d. nuclear stress test 2007: no ischemia, EF 81%  . Degenerative disc disease   . Dyspnea   . History of hyperkalemia   . Hyperlipidemia   . Hypertension   . Mitral valve prolapse   . Norovirus 2014  . Osteoporosis   . Sleep apnea   . Spinal stenosis in cervical region   . Stroke [facility]) 1980s   left brain    Patient Active Problem List   Diagnosis Date Noted  . 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 Acute lower UTI 09/24/2017  . UTI (urinary tract infection) 09/24/2017  . Vitamin D deficiency 03/08/2017  . Slow transit constipation 02/22/2017  . Low BP 11/11/2015  . Polyarthritis 08/05/2015  . Pulmonary fibrosis ([place]) 05/11/2015  . SOB (shortness of breath)   .  Weakness   . 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 PAD (peripheral artery disease) ([place]) 02/09/2015  . 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 OP (osteoporosis) 02/09/2015  . Billowing mitral valve 02/09/2015  . Degeneration macular 02/09/2015  . Hypercholesteremia 02/09/2015  . Adult hypothyroidism 02/09/2015  . Cannot sleep 02/09/2015  . 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 Presence of aortocoronary bypass graft 02/09/2015  . 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 Esophagitis, reflux 02/09/2015  . Diverticulitis 02/09/2015  . Colon, diverticulosis 02/09/2015  . Constipation due to opioid therapy 02/09/2015  . CAD in native artery 02/09/2015  . Colitis 02/09/2015  . Allergic rhinitis 02/09/2015  . Back ache 02/09/2015  . COPD (chronic obstructive pulmonary disease) ([place]) 01/15/2015  . GERD (gastroesophageal reflux disease) 12/23/2014  . Spinal stenosis at L4-L5 level 12/16/2014  . Lumbar nerve root compression 12/16/2014  . Compression fracture of lumbar vertebra ([place]) 12/16/2014  . Lumbar canal stenosis 11/04/2013  . Neuritis or radiculitis due to rupture of lumbar intervertebral disc 11/04/2013  . DDD (degenerative disc disease), lumbar 11/04/2013  . Degeneration of intervertebral disc of lumbar region 11/04/2013  . Dermatophytosis of nail 03/31/2013  . Pain in limb 03/31/2013  . HTN (hypertension) 07/24/2011  . Hyperlipidemia 01/31/2010  . Carotid artery stenosis 12/21/2009  . Atherosclerosis of autologous vein coronary artery bypass graft with stable angina pectoris ([place]) 05/25/2009  . Dyspnea on exertion 05/25/2009    Past Surgical  History:  Procedure Laterality Date  . ABDOMINAL HYSTERECTOMY    . BLADDER SUSPENSION    . CAROTID ENDARTERECTOMY     left  . CHOLECYSTECTOMY    . CORONARY ARTERY BYPASS GRAFT    . HIP SURGERY  02/2011   right    Allergies Sulfa antibiotics and Sulfonamide derivatives  Social History Social History   Tobacco Use  . Smoking status: Former  Smoker    Quit date: 02/22/1979    Years since quitting: 40.0  . Smokeless tobacco: Never Used  . Tobacco comment: quit 50 years ago  Substance Use Topics  . Alcohol use: Yes    Comment: 1 glass of wine rarely  . Drug use: No   Review of Systems Constitutional: Negative for fever. Cardiovascular: Negative for chest pain. Respiratory: Positive for shortness of breath Gastrointestinal: Negative for abdominal pain, positive for constipation Musculoskeletal: Negative for back pain. Skin: Negative for rash. Neurological: Negative for headaches, focal weakness or numbness.  All systems negative/normal/unremarkable except as stated in the HPI  ____________________________________________   PHYSICAL EXAM:  VITAL SIGNS: ED Triage Vitals  Enc Vitals Group     BP 03/03/19 0955 139/68     Pulse Rate 03/03/19 0955 62     Resp 03/03/19 0955 20     Temp 03/03/19 0955 (!) 97.5 F (36.4 C)     Temp Source 03/03/19 0955 Oral     SpO2 03/03/19 0955 99 %     Weight 03/03/19 0956 114 lb 10.2 oz (52 kg)     Height 03/03/19 0956 5\' 5"  (1.651 m)     Head Circumference --      Peak Flow --      Pain Score 03/03/19 0956 0     Pain Loc --      Pain Edu? --      Excl. in [place]? --    Constitutional: Alert and oriented. Well appearing and in no distress. Eyes: Conjunctivae are normal. Normal extraocular movements. ENT      Head: Normocephalic and atraumatic.      Nose: No congestion/rhinnorhea.      Mouth/Throat: Mucous membranes are moist.      Neck: No stridor. Cardiovascular: Normal rate, regular rhythm. No murmurs, rubs, or gallops. Respiratory: Normal respiratory effort without tachypnea nor retractions. Breath sounds are clear and equal bilaterally. No wheezes/rales/rhonchi. Gastrointestinal: Soft and nontender. Normal bowel sounds Musculoskeletal: Nontender with normal range of motion in extremities. No lower extremity tenderness nor edema. Neurologic:  Normal speech and language. No  gross focal neurologic deficits are appreciated.  Skin:  Skin is warm, dry and intact. No rash noted. Psychiatric: Mood and affect are normal. Speech and behavior are normal.  ____________________________________________  EKG: Interpreted by me.  Sinus rhythm with a rate of 62 bpm, RVH, nonspecific T wave abnormalities, nonspecific ST segment changes  ____________________________________________  ED COURSE:  As part of my medical decision making, I reviewed the following data within the [place] History obtained from family if available, nursing notes, old chart and ekg, as well as notes from prior ED visits. Patient presented for shortness of breath, we will assess with labs and imaging as indicated at this time. Clinical Course as of Mar 02 1258  Mon Mar 03, 2019  1047 No new EKG changes compared to prior   [JW]    Clinical Course User Index [JW] Earleen Newport, MD   Procedures  Chelsea [name] was evaluated in Emergency Department on  03/03/2019 for the symptoms described in the history of present illness. She was evaluated in the context of the global COVID-19 pandemic, which necessitated consideration that the patient might be at risk for infection with the SARS-CoV-2 virus that causes COVID-19. Institutional protocols and algorithms that pertain to the evaluation of patients at risk for COVID-19 are in a state of rapid change based on information released by regulatory bodies including the CDC and federal and state organizations. These policies and algorithms were followed during the patient's care in the ED.  ____________________________________________   LABS (pertinent positives/negatives)  Labs Reviewed  CBC WITH DIFFERENTIAL/PLATELET - Abnormal; Notable for the following components:      Result Value   RBC 3.68 (*)    Hemoglobin 10.9 (*)    HCT 33.9 (*)    All other components within normal limits  BASIC METABOLIC PANEL - Abnormal; Notable for  the following components:   Glucose, Bld 106 (*)    BUN 29 (*)    Creatinine, Ser 1.13 (*)    GFR calc non Af Amer 42 (*)    GFR calc Af Amer 49 (*)    All other components within normal limits  BRAIN NATRIURETIC PEPTIDE - Abnormal; Notable for the following components:   B Natriuretic Peptide 117.0 (*)    All other components within normal limits  SARS CORONAVIRUS 2 (HOSPITAL ORDER, [place] [facility])  TROPONIN I (HIGH SENSITIVITY)    RADIOLOGY Images were viewed by me  Chest x-ray IMPRESSION:  Chronic appearing interstitial lung markings without superimposed  acute cardiopulmonary process.  ____________________________________________   DIFFERENTIAL DIAGNOSIS   CHF, COPD, pneumonia, pneumothorax, PE  FINAL ASSESSMENT AND PLAN  Shortness of breath   Plan: The patient had presented for shortness of breath. Patient's labs did not reveal any acute process. Patient's imaging was overall reassuring.  I have sent a message to her primary care doctor that she may require intermittent oxygen but otherwise is cleared for outpatient follow-up.   Laurence Aly, MD    Note: This note was generated in part or whole with voice recognition software. Voice recognition is usually quite accurate but there are transcription errors that can and very often do occur. I apologize for any typographical errors that were not detected and corrected.     Earleen Newport, MD 03/03/19 1300

## 2019-03-03 NOTE — ED Triage Notes (Signed)
PT to ER via EMS from [facility] with c/o shortness of breath.  Pt was seen here yesterday for c/o constipation, states no relief from that.  Pt reported to EMS c/o chest tightness that was relieved with 2L [place].

## 2019-03-03 NOTE — ED Notes (Signed)
Pt up to toilet at this time to urinate. Pt had large BM with this RN on 03/02/2019 after drinking mag citrate and completing a mineral oil enema.

## 2019-03-03 NOTE — ED Notes (Signed)
Contacted steve with [facility]. Richardson Landry states he will be coming to transport pt home, but will be a while before he arrives. Richardson Landry states he will call this RN prior to his arrival.

## 2019-04-18 DIAGNOSIS — M48062 Spinal stenosis, lumbar region with neurogenic claudication: Secondary | ICD-10-CM | POA: Diagnosis not present

## 2019-04-18 DIAGNOSIS — M5136 Other intervertebral disc degeneration, lumbar region: Secondary | ICD-10-CM | POA: Diagnosis not present

## 2019-04-18 DIAGNOSIS — M5416 Radiculopathy, lumbar region: Secondary | ICD-10-CM | POA: Diagnosis not present

## 2019-05-08 DIAGNOSIS — R0600 Dyspnea, unspecified: Secondary | ICD-10-CM | POA: Diagnosis not present

## 2019-05-08 DIAGNOSIS — J449 Chronic obstructive pulmonary disease, unspecified: Secondary | ICD-10-CM | POA: Diagnosis not present

## 2019-07-15 ENCOUNTER — Ambulatory Visit: Payer: Self-pay

## 2019-07-15 NOTE — Telephone Encounter (Signed)
Pt. Reports she started having "a sinus headache and sore glands last night." Nose feels blocked. No fever. "Face hurts and I know I have a sinus infection." Pain is 8/10. Reviewed home remedies, but requests "something be called in." No availability today for a virtual visit. Please advise pt.  Reason for Disposition . [1] Sinus congestion (pressure, fullness) AND [2] present > 10 days  Answer Assessment - Initial Assessment Questions 1. LOCATION: "Where does it hurt?"      Face 2. ONSET: "When did the sinus pain start?"  (e.g., hours, days)      Last night 3. SEVERITY: "How bad is the pain?"   (Scale 1-10; mild, moderate or severe)   - MILD (1-3): doesn't interfere with normal activities    - MODERATE (4-7): interferes with normal activities (e.g., work or school) or awakens from sleep   - SEVERE (8-10): excruciating pain and patient unable to do any normal activities        8 4. RECURRENT SYMPTOM: "Have you ever had sinus problems before?" If so, ask: "When was the last time?" and "What happened that time?"      Yes 5. NASAL CONGESTION: "Is the nose blocked?" If so, ask, "Can you open it or must you breathe through the mouth?"     Yes 6. NASAL DISCHARGE: "Do you have discharge from your nose?" If so ask, "What color?"     None 7. FEVER: "Do you have a fever?" If so, ask: "What is it, how was it measured, and when did it start?"      No 8. OTHER SYMPTOMS: "Do you have any other symptoms?" (e.g., sore throat, cough, earache, difficulty breathing)     No 9. PREGNANCY: "Is there any chance you are pregnant?" "When was your last menstrual period?"     No  Protocols used: SINUS PAIN OR CONGESTION-A-AH

## 2019-07-15 NOTE — Telephone Encounter (Signed)
We don't call in antibiotics. Chelsea [name] will be back in clinic on Thursday if she would like to wait until then or she may be evaluated by another provider to see if antibiotics are necessary.

## 2019-07-16 ENCOUNTER — Ambulatory Visit: Admission: RE | Admit: 2019-07-16 | Payer: Medicare Other | Source: Ambulatory Visit | Admitting: Radiation Oncology

## 2019-07-16 NOTE — Telephone Encounter (Signed)
Patient states that she feels so much better today,07/16/2019 and will hold off on a visit. FYI

## 2019-08-01 ENCOUNTER — Ambulatory Visit
Admission: RE | Admit: 2019-08-01 | Discharge: 2019-08-01 | Disposition: A | Discharge status: Home / Self Care | Payer: Medicare Other | Source: Ambulatory Visit | Attending: Radiation Oncology | Admitting: Radiation Oncology

## 2019-08-01 ENCOUNTER — Other Ambulatory Visit: Payer: Self-pay

## 2019-08-01 ENCOUNTER — Encounter: Payer: Self-pay | Admitting: Radiation Oncology

## 2019-08-01 DIAGNOSIS — J00 Acute nasopharyngitis [common cold]: Secondary | ICD-10-CM | POA: Diagnosis not present

## 2019-08-01 DIAGNOSIS — Z8589 Personal history of malignant neoplasm of other organs and systems: Secondary | ICD-10-CM | POA: Diagnosis not present

## 2019-08-01 DIAGNOSIS — Z87891 Personal history of nicotine dependence: Secondary | ICD-10-CM | POA: Diagnosis not present

## 2019-08-01 DIAGNOSIS — C07 Malignant neoplasm of parotid gland: Secondary | ICD-10-CM

## 2019-08-01 DIAGNOSIS — Z923 Personal history of irradiation: Secondary | ICD-10-CM | POA: Insufficient documentation

## 2019-08-01 NOTE — Progress Notes (Signed)
Radiation Oncology Follow up Note  Name: Chelsea [name]   Date:   08/01/2019 MRN:  [id number] DOB: 01-Jul-1923    This 86 y.o. 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  REFERRING PROVIDER: Florian Buff*  HPI: Patient is a 86 year old female now out 1 year having completed radiation therapy to a rapidly growing mass in the right parotid region causing right ear pain as well as facial discomfort.  Biopsy was positive for malignant neoplasm with necrosis and hemorrhage.  She is seen today in follow-up and is doing well.  She has a slight cold with a nasal drip.  She specifically denies any facial pain any dysphagia or any ear pain..  COMPLICATIONS OF TREATMENT: none  FOLLOW UP COMPLIANCE: keeps appointments   PHYSICAL EXAM:  BP (!) (P) 126/57 (BP Location: Left Arm, Patient Position: Sitting)   Pulse (P) 76   Resp (P) 18   Wt (P) 107 lb 12.8 oz (48.9 kg)   BMI (P) 17.94 kg/m  Examination parotid area showed no evidence of mass or nodularity.  No cervical or supraclavicular adenopathy is identified.  Well-developed well-nourished patient in NAD. HEENT reveals PERLA, EOMI, discs not visualized.  Oral cavity is clear. No oral mucosal lesions are identified. Neck is clear without evidence of cervical or supraclavicular adenopathy. Lungs are clear to A&P. Cardiac examination is essentially unremarkable with regular rate and rhythm without murmur rub or thrill. Abdomen is benign with no organomegaly or masses noted. Motor sensory and DTR levels are equal and symmetric in the upper and lower extremities. Cranial nerves II through XII are grossly intact. Proprioception is intact. No peripheral adenopathy or edema is identified. No motor or sensory levels are noted. Crude visual fields are within normal range.  RADIOLOGY RESULTS: No current films to review  PLAN: Present time patient  has had an excellent palliative benefit from radiation therapy.  She has low side effect profile.  I am pleased with her overall progress I will see him back in 1 year for follow-up.  Patient knows to call at anytime with any concerns.  I would like to take this opportunity to thank you for allowing me to participate in the care of your patient.Noreene Filbert, MD

## 2019-08-13 DIAGNOSIS — M810 Age-related osteoporosis without current pathological fracture: Secondary | ICD-10-CM | POA: Diagnosis not present

## 2019-08-13 DIAGNOSIS — E039 Hypothyroidism, unspecified: Secondary | ICD-10-CM | POA: Diagnosis not present

## 2019-08-13 DIAGNOSIS — E559 Vitamin D deficiency, unspecified: Secondary | ICD-10-CM | POA: Diagnosis not present

## 2019-08-21 ENCOUNTER — Telehealth: Payer: Self-pay

## 2019-08-21 NOTE — Telephone Encounter (Signed)
I dont think I sent this.  I could not find any notes about anything being sent either.  If she is having urine symptoms I guess we could check. If not I do not think necessary.

## 2019-08-21 NOTE — Telephone Encounter (Signed)
Copied from [place] [phone] [id number]. Topic: General - Inquiry >> Aug 21, 2019  4:03 PM Richardo Priest, NT wrote: Reason for CRM: Residency RN with [facility] called in stating a urine test was sent over to patient with urine specimen cup and hat for clean catch. Patient is confused as to why this was sent to her and is wondering if PCP had it ordered. RN states if PCP would like to have a test done to please fax order and they can work on it, then fax results. Please advise Call back is [phone]

## 2019-08-22 ENCOUNTER — Other Ambulatory Visit: Payer: Self-pay

## 2019-08-22 ENCOUNTER — Ambulatory Visit (INDEPENDENT_AMBULATORY_CARE_PROVIDER_SITE_OTHER): Payer: Medicare Other | Admitting: Physician Assistant

## 2019-08-22 ENCOUNTER — Encounter: Payer: Self-pay | Admitting: Physician Assistant

## 2019-08-22 DIAGNOSIS — J0140 Acute pansinusitis, unspecified: Secondary | ICD-10-CM | POA: Diagnosis not present

## 2019-08-22 MED ORDER — AMOXICILLIN-POT CLAVULANATE 875-125 MG PO TABS: 1.0000 | ORAL_TABLET | Freq: Two times a day (BID) | ORAL | 0 refills | Status: DC

## 2019-08-22 MED ORDER — FLUTICASONE PROPIONATE 50 MCG/ACT NA SUSP: 2.0000 | Freq: Every day | NASAL | 1 refills | Status: DC

## 2019-08-22 NOTE — Telephone Encounter (Signed)
Pt has virtual visit today (08/22/2019)   Thanks,   -Mickel Baas

## 2019-08-22 NOTE — Patient Instructions (Signed)

## 2019-08-22 NOTE — Progress Notes (Signed)
Patient: Chelsea [name] Female    DOB: 05/18/1924   86 y.o.   MRN: [id number] Visit Date: 08/22/2019  Today's Provider: Mar Daring, PA-C   No chief complaint on file.  Subjective:    Virtual Visit via Telephone Note  I connected with Chelsea [name] on 08/22/19 at 10:40 AM EST by telephone and verified that I am speaking with the correct person using two identifiers.  Location: Patient: [facility]) Provider: [facility]   I discussed the limitations, risks, security and privacy concerns of performing an evaluation and management service by telephone and the availability of in person appointments. I also discussed with the patient that there may be a patient responsible charge related to this service. The patient expressed understanding and agreed to proceed.  Sinusitis This is a new problem. The current episode started in the past 7 days (past 2-3 days). The problem has been gradually worsening since onset. There has been no fever. The pain is mild. Associated symptoms include congestion, coughing, headaches, a hoarse voice and sinus pressure. Pertinent negatives include no chills, diaphoresis, ear pain, neck pain, shortness of breath, sneezing, sore throat or swollen glands. Past treatments include acetaminophen. The treatment provided no relief.    Allergies  Allergen Reactions  . Sulfa Antibiotics     Other reaction(s): Dizziness  . Sulfonamide Derivatives Other (See Comments)    Reaction:  Dizziness      Current Outpatient Medications:  .  albuterol (VENTOLIN HFA) 108 (90 Base) MCG/ACT inhaler, , Disp: , Rfl:  .  amLODipine (NORVASC) 5 MG tablet, Take 2 tablets (10 mg total) by mouth daily. (Patient taking differently: Take 5 mg by mouth 2 (two) times daily. ), Disp: 90 tablet, Rfl: 3 .  aspirin EC 81 MG tablet, Take 81 mg by mouth daily., Disp: , Rfl:  .  atorvastatin (LIPITOR) 80 MG tablet, Take 1 tablet (80 mg total) by mouth daily., Disp: 90 tablet,  Rfl: 3 .  Calcium Carbonate-Vit D-Min (CALCIUM 600+D PLUS MINERALS) 600-400 MG-UNIT TABS, Take by mouth daily. , Disp: , Rfl:  .  Cholecalciferol (VITAMIN D3) 10000 units TABS, Take 1,000 Units by mouth daily. , Disp: , Rfl:  .  fluticasone (FLONASE) 50 MCG/ACT nasal spray, Place 2 sprays into both nostrils daily. (Patient taking differently: Place 2 sprays into both nostrils daily. As needed), Disp: 16 g, Rfl: 6 .  fluticasone furoate-vilanterol (BREO ELLIPTA) 100-25 MCG/INH AEPB, Inhale 1 puff into the lungs daily., Disp: , Rfl:  .  ibuprofen (ADVIL) 200 MG tablet, Take 200 mg by mouth every 6 (six) hours as needed., Disp: , Rfl:  .  levothyroxine (SYNTHROID, LEVOTHROID) 75 MCG tablet, Take 75 mcg by mouth daily before breakfast. , Disp: , Rfl:  .  linaclotide (LINZESS) 290 MCG CAPS capsule, Take 290 mcg by mouth daily before breakfast., Disp: , Rfl:  .  linaclotide (LINZESS) 72 MCG capsule, Take 1 capsule (72 mcg total) by mouth daily before breakfast., Disp: 90 capsule, Rfl: 1 .  LORazepam (ATIVAN) 0.5 MG tablet, Take 0.5 mg by mouth at bedtime as needed for anxiety., Disp: , Rfl:  .  Multiple Vitamins-Minerals (PRESERVISION AREDS PO), Take 1 capsule by mouth daily. , Disp: , Rfl:  .  nitroGLYCERIN (NITROSTAT) 0.4 MG SL tablet, Place 1 tablet (0.4 mg total) under the tongue every 5 (five) minutes as needed., Disp: 25 tablet, Rfl: 6 .  NONFORMULARY OR COMPOUNDED ITEM, [facility]  Peripheral Neuropathy  Cream- Bupivacaine 1%, Doxepin 3%, Gabapentin 6%, Pentoxifylline 3%, Topiramate 1% Apply 1-2 grams to affected area 3-4 times daily Qty. 120 gm 3 refills, Disp: , Rfl:  .  senna-docusate (SENOKOT-S) 8.6-50 MG tablet, Take 2 tablets by mouth at bedtime as needed for mild constipation., Disp: 90 tablet, Rfl: 1 .  tiotropium (SPIRIVA) 18 MCG inhalation capsule, Place 18 mcg into inhaler and inhale daily., Disp: , Rfl:  .  traMADol (ULTRAM) 50 MG tablet, Take 50 mg by mouth every 6 (six) hours as  needed., Disp: , Rfl:  .  vitamin C (ASCORBIC ACID) 500 MG tablet, Take 500 mg by mouth daily., Disp: , Rfl:   Review of Systems  Constitutional: Negative for chills and diaphoresis.  HENT: Positive for congestion, hoarse voice, postnasal drip, rhinorrhea, sinus pressure and sinus pain. Negative for ear pain, sneezing and sore throat.   Respiratory: Positive for cough. Negative for chest tightness and shortness of breath.   Cardiovascular: Negative for chest pain, palpitations and leg swelling.  Gastrointestinal: Negative for abdominal pain, nausea and vomiting.  Musculoskeletal: Negative for neck pain.  Neurological: Positive for headaches. Negative for dizziness.    Social History   Tobacco Use  . Smoking status: Former Smoker    Quit date: 02/22/1979    Years since quitting: 40.5  . Smokeless tobacco: Never Used  . Tobacco comment: quit 50 years ago  Substance Use Topics  . Alcohol use: Yes    Comment: 1 glass of wine rarely      Objective:   There were no vitals taken for this visit. There were no vitals filed for this visit.There is no height or weight on file to calculate BMI.   Physical Exam Vitals reviewed.  Constitutional:      General: She is not in acute distress. Pulmonary:     Effort: No respiratory distress (sounded congested but able to speak in full sentences).  Neurological:     Mental Status: She is alert.      No results found for any visits on 08/22/19.     Assessment & Plan    1. Acute non-recurrent pansinusitis Worsening symptoms that have not responded to OTC medications. Will give augmentin as below. Continue allergy medications. Stay well hydrated and get plenty of rest. Call if no symptom improvement or if symptoms worsen. - amoxicillin-clavulanate (AUGMENTIN) 875-125 MG tablet; Take 1 tablet by mouth 2 (two) times daily.  Dispense: 20 tablet; Refill: 0 - fluticasone (FLONASE) 50 MCG/ACT nasal spray; Place 2 sprays into both nostrils daily.  As needed  Dispense: 48 mL; Refill: 1    I discussed the assessment and treatment plan with the patient. The patient was provided an opportunity to ask questions and all were answered. The patient agreed with the plan and demonstrated an understanding of the instructions.   The patient was advised to call back or seek an in-person evaluation if the symptoms worsen or if the condition fails to improve as anticipated.  I provided 7 minutes of non-face-to-face time during this encounter.   Mar Daring, PA-C  [facility]

## 2019-09-09 ENCOUNTER — Other Ambulatory Visit: Payer: Self-pay

## 2019-09-09 ENCOUNTER — Emergency Department
Admission: EM | Admit: 2019-09-09 | Discharge: 2019-09-09 | Disposition: A | Discharge status: Home / Self Care | Payer: Medicare Other | Attending: Emergency Medicine | Admitting: Emergency Medicine

## 2019-09-09 DIAGNOSIS — R29810 Facial weakness: Secondary | ICD-10-CM | POA: Diagnosis not present

## 2019-09-09 DIAGNOSIS — I959 Hypotension, unspecified: Secondary | ICD-10-CM | POA: Diagnosis not present

## 2019-09-09 DIAGNOSIS — I10 Essential (primary) hypertension: Secondary | ICD-10-CM | POA: Insufficient documentation

## 2019-09-09 DIAGNOSIS — I2510 Atherosclerotic heart disease of native coronary artery without angina pectoris: Secondary | ICD-10-CM | POA: Diagnosis not present

## 2019-09-09 DIAGNOSIS — Z951 Presence of aortocoronary bypass graft: Secondary | ICD-10-CM | POA: Diagnosis not present

## 2019-09-09 DIAGNOSIS — J449 Chronic obstructive pulmonary disease, unspecified: Secondary | ICD-10-CM | POA: Diagnosis not present

## 2019-09-09 DIAGNOSIS — R519 Headache, unspecified: Secondary | ICD-10-CM | POA: Diagnosis not present

## 2019-09-09 DIAGNOSIS — R42 Dizziness and giddiness: Secondary | ICD-10-CM

## 2019-09-09 DIAGNOSIS — Z79899 Other long term (current) drug therapy: Secondary | ICD-10-CM | POA: Insufficient documentation

## 2019-09-09 DIAGNOSIS — Z87891 Personal history of nicotine dependence: Secondary | ICD-10-CM | POA: Insufficient documentation

## 2019-09-09 DIAGNOSIS — Z7982 Long term (current) use of aspirin: Secondary | ICD-10-CM | POA: Diagnosis not present

## 2019-09-09 DIAGNOSIS — R52 Pain, unspecified: Secondary | ICD-10-CM | POA: Diagnosis not present

## 2019-09-09 LAB — CBC
HCT: 36 % (ref 36.0–46.0)
Hemoglobin: 11.2 g/dL — ABNORMAL LOW (ref 12.0–15.0)
MCH: 29.1 pg (ref 26.0–34.0)
MCHC: 31.1 g/dL (ref 30.0–36.0)
MCV: 93.5 fL (ref 80.0–100.0)
Platelets: 259 10*3/uL (ref 150–400)
RBC: 3.85 MIL/uL — ABNORMAL LOW (ref 3.87–5.11)
RDW: 15.3 % (ref 11.5–15.5)
WBC: 7 10*3/uL (ref 4.0–10.5)
nRBC: 0 % (ref 0.0–0.2)

## 2019-09-09 LAB — URINALYSIS, COMPLETE (UACMP) WITH MICROSCOPIC
Bilirubin Urine: NEGATIVE
Glucose, UA: NEGATIVE mg/dL
Hgb urine dipstick: NEGATIVE
Ketones, ur: NEGATIVE mg/dL
Nitrite: NEGATIVE
Protein, ur: NEGATIVE mg/dL
Specific Gravity, Urine: 1.017 (ref 1.005–1.030)
pH: 6 (ref 5.0–8.0)

## 2019-09-09 LAB — BASIC METABOLIC PANEL
Anion gap: 10 (ref 5–15)
BUN: 35 mg/dL — ABNORMAL HIGH (ref 8–23)
CO2: 26 mmol/L (ref 22–32)
Calcium: 9 mg/dL (ref 8.9–10.3)
Chloride: 103 mmol/L (ref 98–111)
Creatinine, Ser: 1.09 mg/dL — ABNORMAL HIGH (ref 0.44–1.00)
GFR calc Af Amer: 51 mL/min — ABNORMAL LOW (ref >60)
GFR calc non Af Amer: 44 mL/min — ABNORMAL LOW (ref >60)
Glucose, Bld: 101 mg/dL — ABNORMAL HIGH (ref 70–99)
Potassium: 4.4 mmol/L (ref 3.5–5.1)
Sodium: 139 mmol/L (ref 135–145)

## 2019-09-09 MED ORDER — SODIUM CHLORIDE 0.9% FLUSH: 3.0000 mL | Freq: Once | INTRAVENOUS | Status: DC

## 2019-09-09 NOTE — ED Triage Notes (Signed)
Pt comes from [facility] with c/o dizziness. Pt states this started this am. Pt denies any CP or SOB.  Pt states little headache. Pt denies fever, chills, N/V/D.

## 2019-09-09 NOTE — ED Provider Notes (Signed)
[facility] Emergency Department Provider Note   ____________________________________________   First MD Initiated Contact with Patient 09/09/19 1853     (approximate)  I have reviewed the triage vital signs and the nursing notes.   HISTORY  Chief Complaint Weakness    HPI Chelsea [name] is a 86 y.o. female with past medical history of CAD status post CABG, hypertension, hyperlipidemia, COPD, PAD, and pulmonary fibrosis who presents to the ED complaining of lightheadedness.  Patient reports that she had an episode of lightheadedness earlier today when she was moving laundry out of the dryer at [facility].  She states the lightheadedness has since resolved and she did not have any chest pain or shortness of breath with this episode.  When she spoke with the nurse at [facility], it was advised that she be evaluated in the ED.  She states she has otherwise been feeling well with no fevers, cough, vomiting, diarrhea, dysuria, or hematuria.        Past Medical History:  Diagnosis Date  . Asthma   . Bradycardia    hx of it and fatigue with beta blockade  . Carotid artery disease ([place])    s/p carotid endarterectomy  . Coronary artery disease    a. s/p 2 vessel CABG 1989; b. echo 2003: nl; c. cath 2006: patent grafts, EF 65%; d. nuclear stress test 2007: no ischemia, EF 81%  . Degenerative disc disease   . Dyspnea   . History of hyperkalemia   . Hyperlipidemia   . Hypertension   . Mitral valve prolapse   . Norovirus 2014  . Osteoporosis   . Sleep apnea   . Spinal stenosis in cervical region   . Stroke [facility]) 1980s   left brain    Patient Active Problem List   Diagnosis Date Noted  . 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 Acute lower UTI 09/24/2017  . UTI (urinary tract infection) 09/24/2017  . Vitamin D deficiency 03/08/2017  . Slow transit constipation 02/22/2017  . Low BP 11/11/2015  . Polyarthritis 08/05/2015  . Pulmonary fibrosis ([place])  05/11/2015  . SOB (shortness of breath)   . Weakness   . 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 PAD (peripheral artery disease) ([place]) 02/09/2015  . 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 OP (osteoporosis) 02/09/2015  . Billowing mitral valve 02/09/2015  . Degeneration macular 02/09/2015  . Hypercholesteremia 02/09/2015  . Adult hypothyroidism 02/09/2015  . Cannot sleep 02/09/2015  . 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 Presence of aortocoronary bypass graft 02/09/2015  . 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 Esophagitis, reflux 02/09/2015  . Diverticulitis 02/09/2015  . Colon, diverticulosis 02/09/2015  . Constipation due to opioid therapy 02/09/2015  . CAD in native artery 02/09/2015  . Colitis 02/09/2015  . Allergic rhinitis 02/09/2015  . Back ache 02/09/2015  . COPD (chronic obstructive pulmonary disease) ([place]) 01/15/2015  . GERD (gastroesophageal reflux disease) 12/23/2014  . Spinal stenosis at L4-L5 level 12/16/2014  . Lumbar nerve root compression 12/16/2014  . Compression fracture of lumbar vertebra ([place]) 12/16/2014  . Lumbar canal stenosis 11/04/2013  . Neuritis or radiculitis due to rupture of lumbar intervertebral disc 11/04/2013  . DDD (degenerative disc disease), lumbar 11/04/2013  . Degeneration of intervertebral disc of lumbar region 11/04/2013  . Dermatophytosis of nail 03/31/2013  . Pain in limb 03/31/2013  . HTN (hypertension) 07/24/2011  . Hyperlipidemia 01/31/2010  . Carotid artery stenosis 12/21/2009  . Atherosclerosis of autologous vein coronary artery bypass graft with  stable angina pectoris ([place]) 05/25/2009  . Dyspnea on exertion 05/25/2009    Past Surgical History:  Procedure Laterality Date  . ABDOMINAL HYSTERECTOMY    . BLADDER SUSPENSION    . CAROTID ENDARTERECTOMY     left  . CHOLECYSTECTOMY    . CORONARY ARTERY BYPASS GRAFT    . HIP SURGERY  02/2011   right    Prior to Admission medications   Medication Sig Start Date End Date Taking? Authorizing  Provider  albuterol (VENTOLIN HFA) 108 (90 Base) MCG/ACT inhaler  05/08/19   [provider]  amLODipine (NORVASC) 5 MG tablet Take 2 tablets (10 mg total) by mouth daily. Patient taking differently: Take 5 mg by mouth 2 (two) times daily.  12/06/17   Mar Daring, PA-C  amoxicillin-clavulanate (AUGMENTIN) 875-125 MG tablet Take 1 tablet by mouth 2 (two) times daily. 08/22/19   Mar Daring, PA-C  aspirin EC 81 MG tablet Take 81 mg by mouth daily.    [provider]  atorvastatin (LIPITOR) 80 MG tablet Take 1 tablet (80 mg total) by mouth daily. 03/15/17   Minna Merritts, MD  Calcium Carbonate-Vit D-Min (CALCIUM 600+D PLUS MINERALS) 600-400 MG-UNIT TABS Take by mouth daily.     [provider]  Cholecalciferol (VITAMIN D3) 10000 units TABS Take 1,000 Units by mouth daily.     [provider]  fluticasone (FLONASE) 50 MCG/ACT nasal spray Place 2 sprays into both nostrils daily. As needed 08/22/19   Mar Daring, PA-C  fluticasone furoate-vilanterol (BREO ELLIPTA) 100-25 MCG/INH AEPB Inhale 1 puff into the lungs daily.    [provider]  ibuprofen (ADVIL) 200 MG tablet Take 200 mg by mouth every 6 (six) hours as needed.    [provider]  levothyroxine (SYNTHROID, LEVOTHROID) 75 MCG tablet Take 75 mcg by mouth daily before breakfast.  04/19/18   [provider]  linaclotide (LINZESS) 290 MCG CAPS capsule Take 290 mcg by mouth daily before breakfast.    [provider]  linaclotide (LINZESS) 72 MCG capsule Take 1 capsule (72 mcg total) by mouth daily before breakfast. 01/07/19   [place]ette, Clearnce Sorrel, PA-C  LORazepam (ATIVAN) 0.5 MG tablet Take 0.5 mg by mouth at bedtime as needed for anxiety.    [provider]  Multiple Vitamins-Minerals (PRESERVISION AREDS PO) Take 1 capsule by mouth daily.     [provider]  nitroGLYCERIN (NITROSTAT) 0.4 MG SL tablet Place 1 tablet (0.4 mg total) under  the tongue every 5 (five) minutes as needed. 02/07/16   Minna Merritts, MD  NONFORMULARY OR COMPOUNDED ITEM [facility]  Peripheral Neuropathy Cream- Bupivacaine 1%, Doxepin 3%, Gabapentin 6%, Pentoxifylline 3%, Topiramate 1% Apply 1-2 grams to affected area 3-4 times daily Qty. 120 gm 3 refills    [provider]  senna-docusate (SENOKOT-S) 8.6-50 MG tablet Take 2 tablets by mouth at bedtime as needed for mild constipation. 06/25/18   Mar Daring, PA-C  tiotropium (SPIRIVA) 18 MCG inhalation capsule Place 18 mcg into inhaler and inhale daily.    [provider]  traMADol (ULTRAM) 50 MG tablet Take 50 mg by mouth every 6 (six) hours as needed.    [provider]  vitamin C (ASCORBIC ACID) 500 MG tablet Take 500 mg by mouth daily.    [provider]    Allergies Sulfa antibiotics and Sulfonamide derivatives  Family History  Problem Relation Age of Onset  . Throat cancer Mother   .  Stroke Father     Social History Social History   Tobacco Use  . Smoking status: Former Smoker    Quit date: 02/22/1979    Years since quitting: 40.5  . Smokeless tobacco: Never Used  . Tobacco comment: quit 50 years ago  Substance Use Topics  . Alcohol use: Yes    Comment: 1 glass of wine rarely  . Drug use: No    Review of Systems  Constitutional: No fever/chills Eyes: No visual changes. ENT: No sore throat. Cardiovascular: Denies chest pain.  Positive for lightheadedness. Respiratory: Denies shortness of breath. Gastrointestinal: No abdominal pain.  No nausea, no vomiting.  No diarrhea.  No constipation. Genitourinary: Negative for dysuria. Musculoskeletal: Negative for back pain. Skin: Negative for rash. Neurological: Negative for headaches, focal weakness or numbness.  ____________________________________________   PHYSICAL EXAM:  VITAL SIGNS: ED Triage Vitals [09/09/19 1521]  Enc Vitals Group     BP (!) 156/51     Pulse Rate 72       Resp 18     Temp 98 F (36.7 C)     Temp src      SpO2 100 %     Weight 115 lb (52.2 kg)     Height 5\' 2"  (1.575 m)     Head Circumference      Peak Flow      Pain Score 0     Pain Loc      Pain Edu?      Excl. in [place]?     Constitutional: Alert and oriented. Eyes: Conjunctivae are normal. Head: Atraumatic. Nose: No congestion/rhinnorhea. Mouth/Throat: Mucous membranes are moist. Neck: Normal ROM Cardiovascular: Normal rate, regular rhythm. Grossly normal heart sounds. Respiratory: Normal respiratory effort.  No retractions. Lungs CTAB. Gastrointestinal: Soft and nontender. No distention. Genitourinary: deferred Musculoskeletal: No lower extremity tenderness nor edema. Neurologic:  Normal speech and language. No gross focal neurologic deficits are appreciated. Skin:  Skin is warm, dry and intact. No rash noted. Psychiatric: Mood and affect are normal. Speech and behavior are normal.  ____________________________________________   LABS (all labs ordered are listed, but only abnormal results are displayed)  Labs Reviewed  BASIC METABOLIC PANEL - Abnormal; Notable for the following components:      Result Value   Glucose, Bld 101 (*)    BUN 35 (*)    Creatinine, Ser 1.09 (*)    GFR calc non Af Amer 44 (*)    GFR calc Af Amer 51 (*)    All other components within normal limits  CBC - Abnormal; Notable for the following components:   RBC 3.85 (*)    Hemoglobin 11.2 (*)    All other components within normal limits  URINALYSIS, COMPLETE (UACMP) WITH MICROSCOPIC - Abnormal; Notable for the following components:   Color, Urine YELLOW (*)    APPearance CLEAR (*)    Leukocytes,Ua SMALL (*)    Bacteria, UA RARE (*)    All other components within normal limits  CBG MONITORING, ED   ____________________________________________  EKG  ED ECG REPORT I, Blake Divine, the attending physician, personally viewed and interpreted this ECG.   Date: 09/09/2019  EKG Time:  15:34  Rate: 59  Rhythm: normal sinus rhythm  Axis: Normal  Intervals:none  ST&T Change: None   PROCEDURES  Procedure(s) performed (including Critical Care):  Procedures   ____________________________________________   INITIAL IMPRESSION / ASSESSMENT AND PLAN / ED COURSE       86 year old female with history of  CAD status post CABG, hypertension, hyperlipidemia, COPD, and pulmonary fibrosis presents to the ED following an episode of lightheadedness while moving laundry earlier this morning.  Symptoms have since resolved and patient states she has been asymptomatic since arriving here in the ED.  EKG shows no evidence of arrhythmia or ischemia and patient denies any chest pain or shortness of breath with this episode.  She currently denies any headache and has a nonfocal neurologic exam.  Lab work is reassuring, electrolytes within normal limits and UA without evidence of infection.  She is requesting to be discharged home at this time, which is appropriate.  She was counseled to follow-up with her PCP and to return to the ED for new worsening symptoms.  Patient agrees with plan.      ____________________________________________   FINAL CLINICAL IMPRESSION(S) / ED DIAGNOSES  Final diagnoses:  Lightheadedness     ED Discharge Orders    None       Note:  This document was prepared using Dragon voice recognition software and may include unintentional dictation errors.   Blake Divine, MD 09/09/19 Lurena Nida

## 2019-09-11 DIAGNOSIS — M810 Age-related osteoporosis without current pathological fracture: Secondary | ICD-10-CM | POA: Diagnosis not present

## 2019-09-11 NOTE — Progress Notes (Signed)
Patient: Chelsea [name] Female    DOB: 1923/07/29   86 y.o.   MRN: [id number] Visit Date: 09/12/2019  Today's Provider: Mar Daring, PA-C   Chief Complaint  Patient presents with  . Follow-up    ER Visit   Subjective:     HPI   Follow Up ER Visit  Patient is here for ER follow up.  She was recently seen at [facility] for [place] on 09/09/2019. Treatment for this included none. Resolved. Patient had EKG no evidence of arrhythmia or ischemia -, UA-no sign of infection, Labs all normal. She reports good compliance with treatment. She reports this condition is improved. Has had no recurrence since.  Reports she had been doing laundry and was bending and twisting going from her washer and dryer when she noticed that she became lightheaded and dizzy. She called the triage RN at [address] [facility] and was advised to be evaluated at the ER.   On evaluation it is noted that patient has been losing weight. Over the last year she has lost a total of around 10 pounds per our scales, but per records she appears to have lost almost 4 pounds since 08/13/19. She reports she just does not have the same appetite she once did. She reports she eats toast for breakfast, may have cheese and crackers for lunch, has a frappacino every afternoon and then has supper delivered from [facility]. She denies any nausea or vomiting. States "I just do not snack like I used to." Does have chronic constipation that is currently stable and controlled with Linzess 54mcg.  ------------------------------------------------------------------------------------  Allergies  Allergen Reactions  . Sulfa Antibiotics     Other reaction(s): Dizziness  . Sulfonamide Derivatives Other (See Comments)    Reaction:  Dizziness      Current Outpatient Medications:  .  albuterol (VENTOLIN HFA) 108 (90 Base) MCG/ACT inhaler, , Disp: , Rfl:  .  amLODipine (NORVASC) 5 MG tablet, Take 2 tablets (10 mg total) by mouth daily.  (Patient taking differently: Take 5 mg by mouth 2 (two) times daily. ), Disp: 90 tablet, Rfl: 3 .  aspirin EC 81 MG tablet, Take 81 mg by mouth daily., Disp: , Rfl:  .  atorvastatin (LIPITOR) 80 MG tablet, Take 1 tablet (80 mg total) by mouth daily., Disp: 90 tablet, Rfl: 3 .  Calcium Carbonate-Vit D-Min (CALCIUM 600+D PLUS MINERALS) 600-400 MG-UNIT TABS, Take by mouth daily. , Disp: , Rfl:  .  Cholecalciferol (VITAMIN D3) 10000 units TABS, Take 1,000 Units by mouth daily. , Disp: , Rfl:  .  levothyroxine (SYNTHROID, LEVOTHROID) 75 MCG tablet, Take 75 mcg by mouth daily before breakfast. , Disp: , Rfl:  .  linaclotide (LINZESS) 290 MCG CAPS capsule, Take 290 mcg by mouth daily before breakfast., Disp: , Rfl:  .  LORazepam (ATIVAN) 0.5 MG tablet, Take 0.5 mg by mouth at bedtime as needed for anxiety., Disp: , Rfl:  .  Multiple Vitamins-Minerals (PRESERVISION AREDS PO), Take 1 capsule by mouth daily. , Disp: , Rfl:  .  nitroGLYCERIN (NITROSTAT) 0.4 MG SL tablet, Place 1 tablet (0.4 mg total) under the tongue every 5 (five) minutes as needed., Disp: 25 tablet, Rfl: 6 .  senna-docusate (SENOKOT-S) 8.6-50 MG tablet, Take 2 tablets by mouth at bedtime as needed for mild constipation., Disp: 90 tablet, Rfl: 1 .  vitamin C (ASCORBIC ACID) 500 MG tablet, Take 500 mg by mouth daily., Disp: , Rfl:  .  amoxicillin-clavulanate (  AUGMENTIN) 875-125 MG tablet, Take 1 tablet by mouth 2 (two) times daily., Disp: 20 tablet, Rfl: 0 .  fluticasone (FLONASE) 50 MCG/ACT nasal spray, Place 2 sprays into both nostrils daily. As needed (Patient not taking: Reported on 09/12/2019), Disp: 48 mL, Rfl: 1 .  fluticasone furoate-vilanterol (BREO ELLIPTA) 100-25 MCG/INH AEPB, Inhale 1 puff into the lungs daily., Disp: , Rfl:  .  ibuprofen (ADVIL) 200 MG tablet, Take 200 mg by mouth every 6 (six) hours as needed., Disp: , Rfl:  .  linaclotide (LINZESS) 72 MCG capsule, Take 1 capsule (72 mcg total) by mouth daily before breakfast.  (Patient not taking: Reported on 09/12/2019), Disp: 90 capsule, Rfl: 1 .  NONFORMULARY OR COMPOUNDED ITEM, [facility]  Peripheral Neuropathy Cream- Bupivacaine 1%, Doxepin 3%, Gabapentin 6%, Pentoxifylline 3%, Topiramate 1% Apply 1-2 grams to affected area 3-4 times daily Qty. 120 gm 3 refills, Disp: , Rfl:  .  tiotropium (SPIRIVA) 18 MCG inhalation capsule, Place 18 mcg into inhaler and inhale daily., Disp: , Rfl:  .  traMADol (ULTRAM) 50 MG tablet, Take 50 mg by mouth every 6 (six) hours as needed., Disp: , Rfl:   Review of Systems  Constitutional: Positive for appetite change, fatigue and unexpected weight change.  HENT: Negative.   Eyes: Negative for visual disturbance.  Respiratory: Negative.   Cardiovascular: Negative.   Gastrointestinal: Positive for constipation. Negative for abdominal pain.  Musculoskeletal: Positive for back pain.  Neurological: Negative for dizziness, light-headedness and headaches.    Social History   Tobacco Use  . Smoking status: Former Smoker    Quit date: 02/22/1979    Years since quitting: 40.5  . Smokeless tobacco: Never Used  . Tobacco comment: quit 50 years ago  Substance Use Topics  . Alcohol use: Yes    Comment: 1 glass of wine rarely      Objective:   BP 123/64 (BP Location: Left Arm, Patient Position: Sitting, Cuff Size: Normal)   Pulse 66   Temp (!) 96.9 F (36.1 C) (Temporal)   Resp 16   Wt 105 lb 3.2 oz (47.7 kg)   SpO2 98%   BMI 19.24 kg/m  Vitals:   09/12/19 1003  BP: 123/64  Pulse: 66  Resp: 16  Temp: (!) 96.9 F (36.1 C)  TempSrc: Temporal  SpO2: 98%  Weight: 105 lb 3.2 oz (47.7 kg)  Body mass index is 19.24 kg/m.   Physical Exam Vitals reviewed.  Constitutional:      General: She is not in acute distress.    Appearance: Normal appearance. She is well-developed and normal weight. She is not ill-appearing or diaphoretic.  Cardiovascular:     Rate and Rhythm: Normal rate and regular rhythm.     Pulses:  Normal pulses.     Heart sounds: Normal heart sounds. No murmur. No friction rub. No gallop.   Pulmonary:     Effort: Pulmonary effort is normal. No respiratory distress.     Breath sounds: Normal breath sounds. No wheezing or rales.  Musculoskeletal:     Cervical back: Normal range of motion and neck supple.     Right lower leg: No edema.     Left lower leg: No edema.     Comments: Severe kyphosis  Neurological:     Mental Status: She is alert.      No results found for any visits on 09/12/19.     Assessment & Plan    1. Lightheaded Resolved. Notes, labs and any images  from ER visit were reviewed personally by me prior to appt.   2. Weight loss Suspect decreased intake. Advised to add high protein shake every morning at breakfast. She voices understanding and agrees.  - Nutritional Supplements (ENSURE HIGH PROTEIN) LIQD; Drink one protein drink with breakfast daily  Dispense: 2844 mL; Refill: 5  3. Mild protein-calorie malnutrition ([place]) See above medical treatment plan. - Nutritional Supplements (ENSURE HIGH PROTEIN) LIQD; Drink one protein drink with breakfast daily  Dispense: 2844 mL; Refill: Langdon Place, PA-C  [facility]

## 2019-09-12 ENCOUNTER — Encounter: Payer: Self-pay | Admitting: Physician Assistant

## 2019-09-12 ENCOUNTER — Ambulatory Visit (INDEPENDENT_AMBULATORY_CARE_PROVIDER_SITE_OTHER): Payer: Medicare Other | Admitting: Physician Assistant

## 2019-09-12 VITALS — BP 123/64 | HR 66 | Temp 96.9°F | Resp 16 | Wt 105.2 lb

## 2019-09-12 DIAGNOSIS — R42 Dizziness and giddiness: Secondary | ICD-10-CM | POA: Diagnosis not present

## 2019-09-12 DIAGNOSIS — R634 Abnormal weight loss: Secondary | ICD-10-CM

## 2019-09-12 DIAGNOSIS — E441 Mild protein-calorie malnutrition: Secondary | ICD-10-CM | POA: Diagnosis not present

## 2019-09-12 MED ORDER — ENSURE HIGH PROTEIN PO LIQD: ORAL | 5 refills | Status: DC

## 2019-09-12 NOTE — Patient Instructions (Signed)
Protein-Energy Malnutrition Protein-energy malnutrition is when a person does not eat enough protein, fat, and calories. When this happens over time, it can lead to severe loss of muscle tissue (muscle wasting). This condition also affects the body's defense system (immune system) and can lead to other health problems. What are the causes? This condition may be caused by:  Not eating enough protein, fat, or calories.  Having certain chronic medical conditions.  Eating too little. What increases the risk? The following factors may make you more likely to develop this condition:  Living in poverty.  Long-term hospitalization.  Alcohol or drug dependency. Addiction often leads to a lifestyle in which proper diet is ignored. Dependency can also hurt the metabolism and the body's ability to absorb nutrients.  Eating disorders, such as anorexia nervosa or bulimia.  Chewing or swallowing problems. People with these disorders may not eat enough.  Having certain conditions, such as: ? Inflammatory bowel disease. Inflammation of the intestines makes it difficult for the body to absorb nutrients. ? Cancer or AIDS. These diseases can cause a loss of appetite. ? Chronic heart failure. This interferes with how the body uses nutrients. ? Cystic fibrosis. This disease can make it difficult for the body to absorb nutrients.  Eating a diet that extremely restricts protein, fat, or calorie intake. What are the signs or symptoms? Symptoms of this condition include:  Fatigue.  Weakness.  Dizziness.  Fainting.  Weight loss.  Loss of muscle tone and muscle mass.  Poor immune response.  Lack of menstruation.  Poor memory.  Hair loss.  Skin changes. How is this diagnosed? This condition may be diagnosed based on:  Your medical and dietary history.  A physical exam. This may include a measurement of your body mass index (BMI).  Blood tests. How is this treated? This condition may  be managed with:  Nutrition therapy. This may include working with a diet and nutrition specialist (dietitian).  Treatment for underlying conditions. People with severe protein-energy malnutrition may need to be treated in a hospital. This may involve receiving nutrition and fluids through an IV. Follow these instructions at home:   Eat a balanced diet. In each meal, include at least one food that is high in protein. Foods that are high in protein include: ? Meat. ? Poultry. ? Fish. ? Eggs. ? Cheese. ? Milk. ? Beans. ? Nuts.  Eat nutrient-rich foods that are easy to swallow and digest, such as: ? Fruit and yogurt smoothies. ? Oatmeal with nut butter.  Try to eat six small meals each day instead of three large meals.  Take vitamin and protein supplements as told by your health care provider or dietitian.  Follow your health care provider's recommendations about exercise and activity.  Keep all follow-up visits as told by your health care provider. This is important. Contact a health care provider if you:  Have increased weakness or fatigue.  Faint.  Are a woman and you stop having your period (menstruating).  Have rapid hair loss.  Have unexpected weight loss.  Have diarrhea.  Have nausea and vomiting. Get help right away if you have:  Difficulty breathing.  Chest pain. Summary  Protein-energy malnutrition is when a person does not eat enough protein, fat, and calories.  Protein-energy malnutrition can lead to severe loss of muscle tissue (muscle wasting). This condition also affects the body's defense system (immune system) and can lead to other health problems.  Talk with your health care provider about treatment for this   condition. Effective treatment depends on the underlying cause of the malnutrition. This information is not intended to replace advice given to you by your health care provider. Make sure you discuss any questions you have with your health  care provider. Document Revised: 06/20/2017 Document Reviewed: 06/20/2017 Elsevier Patient Education  2020 [employer] Inc[employer]  

## 2019-10-02 ENCOUNTER — Telehealth: Payer: Self-pay | Admitting: Physician Assistant

## 2019-10-02 NOTE — Telephone Encounter (Signed)
She can cut back and use every other day to see how that affects her bowels.

## 2019-10-02 NOTE — Telephone Encounter (Signed)
Pt called to speak with Tawanna Sat about the Ensure she has been drinking. Pt thinks the ensure has been sending her to bathroom a lot/ Pt called it a laxative / Pt wants to know if she needs to stop drinking them /please advise

## 2019-10-03 ENCOUNTER — Other Ambulatory Visit: Payer: Self-pay | Admitting: Physician Assistant

## 2019-10-03 DIAGNOSIS — R634 Abnormal weight loss: Secondary | ICD-10-CM

## 2019-10-03 DIAGNOSIS — E441 Mild protein-calorie malnutrition: Secondary | ICD-10-CM

## 2019-10-03 MED ORDER — ENSURE HIGH PROTEIN PO LIQD: ORAL | 5 refills | Status: AC

## 2019-10-03 NOTE — Telephone Encounter (Signed)
Patient advised.

## 2019-10-03 NOTE — Telephone Encounter (Signed)
Medication Refill - Medication:  Nutritional Supplements (ENSURE HIGH PROTEIN)  Has the patient contacted their pharmacy? Yes.   (Agent: If no, request that the patient contact the pharmacy for the refill.) (Agent: If yes, when and what did the pharmacy advise?)  Preferred Pharmacy (with phone number or street name):  [place], [place] - [place]  [address] [place] [address]  Phone: [phone] Fax: [phone]     Agent: Please be advised that RX refills may take up to 3 business days. We ask that you follow-up with your pharmacy.

## 2019-10-06 ENCOUNTER — Telehealth: Payer: Self-pay | Admitting: Physician Assistant

## 2019-10-06 NOTE — Telephone Encounter (Signed)
Chelsea [name]  Social Worker at [employer] [employer] is clarify prescribtion for ensure that was sent to [facility] Please advise Cb- [phone]

## 2019-10-07 NOTE — Telephone Encounter (Signed)
LM for Fleet Contras social worker at [facility] to call me back.

## 2019-10-07 NOTE — Telephone Encounter (Signed)
Chelsea [name] returned call during lunch, please return call

## 2019-10-10 NOTE — Telephone Encounter (Signed)
Copied from [place] [id number]. Topic: General - Inquiry >> Oct 10, 2019  9:16 AM Mathis Bud wrote: Reason for CRM: Porsha from [facility], would like to check status of ensure order for patient. Call back [phone]

## 2019-10-10 NOTE — Telephone Encounter (Signed)
Spoke with Evlyn Clines and clairified prescription.

## 2019-10-20 ENCOUNTER — Emergency Department
Admission: EM | Admit: 2019-10-20 | Discharge: 2019-10-20 | Disposition: A | Discharge status: Home / Self Care | Payer: Medicare Other | Attending: Student | Admitting: Student

## 2019-10-20 ENCOUNTER — Other Ambulatory Visit: Payer: Self-pay

## 2019-10-20 ENCOUNTER — Encounter: Payer: Self-pay | Admitting: Emergency Medicine

## 2019-10-20 DIAGNOSIS — I10 Essential (primary) hypertension: Secondary | ICD-10-CM | POA: Insufficient documentation

## 2019-10-20 DIAGNOSIS — I2510 Atherosclerotic heart disease of native coronary artery without angina pectoris: Secondary | ICD-10-CM | POA: Insufficient documentation

## 2019-10-20 DIAGNOSIS — Z951 Presence of aortocoronary bypass graft: Secondary | ICD-10-CM | POA: Insufficient documentation

## 2019-10-20 DIAGNOSIS — Z7982 Long term (current) use of aspirin: Secondary | ICD-10-CM | POA: Insufficient documentation

## 2019-10-20 DIAGNOSIS — Z87891 Personal history of nicotine dependence: Secondary | ICD-10-CM | POA: Diagnosis not present

## 2019-10-20 DIAGNOSIS — J449 Chronic obstructive pulmonary disease, unspecified: Secondary | ICD-10-CM | POA: Diagnosis not present

## 2019-10-20 DIAGNOSIS — Z8673 Personal history of transient ischemic attack (TIA), and cerebral infarction without residual deficits: Secondary | ICD-10-CM | POA: Diagnosis not present

## 2019-10-20 DIAGNOSIS — R42 Dizziness and giddiness: Secondary | ICD-10-CM | POA: Diagnosis not present

## 2019-10-20 DIAGNOSIS — Z79899 Other long term (current) drug therapy: Secondary | ICD-10-CM | POA: Insufficient documentation

## 2019-10-20 LAB — URINALYSIS, COMPLETE (UACMP) WITH MICROSCOPIC
Bacteria, UA: NONE SEEN
Bilirubin Urine: NEGATIVE
Glucose, UA: NEGATIVE mg/dL
Hgb urine dipstick: NEGATIVE
Ketones, ur: NEGATIVE mg/dL
Leukocytes,Ua: NEGATIVE
Nitrite: NEGATIVE
Protein, ur: 100 mg/dL — AB
RBC / HPF: NONE SEEN RBC/hpf (ref 0–5)
Specific Gravity, Urine: 1.02 (ref 1.005–1.030)
WBC, UA: NONE SEEN WBC/hpf (ref 0–5)
pH: 8 (ref 5.0–8.0)

## 2019-10-20 LAB — CBC WITH DIFFERENTIAL/PLATELET
Abs Immature Granulocytes: 0.02 10*3/uL (ref 0.00–0.07)
Basophils Absolute: 0 10*3/uL (ref 0.0–0.1)
Basophils Relative: 1 %
Eosinophils Absolute: 0.1 10*3/uL (ref 0.0–0.5)
Eosinophils Relative: 1 %
HCT: 33.2 % — ABNORMAL LOW (ref 36.0–46.0)
Hemoglobin: 10.7 g/dL — ABNORMAL LOW (ref 12.0–15.0)
Immature Granulocytes: 0 %
Lymphocytes Relative: 13 %
Lymphs Abs: 0.8 10*3/uL (ref 0.7–4.0)
MCH: 29.8 pg (ref 26.0–34.0)
MCHC: 32.2 g/dL (ref 30.0–36.0)
MCV: 92.5 fL (ref 80.0–100.0)
Monocytes Absolute: 0.4 10*3/uL (ref 0.1–1.0)
Monocytes Relative: 6 %
Neutro Abs: 5.2 10*3/uL (ref 1.7–7.7)
Neutrophils Relative %: 79 %
Platelets: 267 10*3/uL (ref 150–400)
RBC: 3.59 MIL/uL — ABNORMAL LOW (ref 3.87–5.11)
RDW: 15.1 % (ref 11.5–15.5)
WBC: 6.6 10*3/uL (ref 4.0–10.5)
nRBC: 0 % (ref 0.0–0.2)

## 2019-10-20 LAB — BASIC METABOLIC PANEL
Anion gap: 11 (ref 5–15)
BUN: 28 mg/dL — ABNORMAL HIGH (ref 8–23)
CO2: 25 mmol/L (ref 22–32)
Calcium: 9 mg/dL (ref 8.9–10.3)
Chloride: 103 mmol/L (ref 98–111)
Creatinine, Ser: 1.2 mg/dL — ABNORMAL HIGH (ref 0.44–1.00)
GFR calc Af Amer: 45 mL/min — ABNORMAL LOW (ref >60)
GFR calc non Af Amer: 39 mL/min — ABNORMAL LOW (ref >60)
Glucose, Bld: 115 mg/dL — ABNORMAL HIGH (ref 70–99)
Potassium: 4.3 mmol/L (ref 3.5–5.1)
Sodium: 139 mmol/L (ref 135–145)

## 2019-10-20 MED ORDER — SODIUM CHLORIDE 0.9 % IV BOLUS
500.0000 mL | Freq: Once | INTRAVENOUS | Status: AC
  Administered 2019-10-20: 500 mL via INTRAVENOUS

## 2019-10-20 NOTE — ED Notes (Signed)
Pt ambulated down the hallway with assistance with a walker. Pt normally walks with walker at home

## 2019-10-20 NOTE — ED Notes (Signed)
Pt ambulated to the restroom with 1-person assistance. Pt states she did feel light headed when she went to the bathroom.

## 2019-10-20 NOTE — ED Triage Notes (Signed)
Pt to ED via [facility] from [facility] independent living. Per EMS pt has missed 3 out of 4 doses of her medication. Pt states that she does not remember missing medications. Pts BP has been running higher. Pt states that she feels lightheaded and is having slight headache. Pt is in NAD.

## 2019-10-20 NOTE — ED Notes (Addendum)
Representative at [facility] states that the pt's purse is at her home at this time.

## 2019-10-20 NOTE — ED Provider Notes (Signed)
[facility] Emergency Department Provider Note  ____________________________________________   First MD Initiated Contact with Patient 10/20/19 1202     (approximate)  I have reviewed the triage vital signs and the nursing notes.  History  Chief Complaint Hypertension    HPI Chelsea [name] is a 86 y.o. female past medical history as below, including CAD s/p CABG, HTN, HLD, COPD, PAD, carotid artery disease s/p CEA on L, who presents to the emergency department for lightheadedness and elevated blood pressure reading.  Patient states this morning when she woke up she felt a little bit lightheaded.  Denies any vertiginous or spinning sensation.  Denies any other associated symptoms, including syncope, chest pain, palpitations, shortness of breath, lateralizing weakness/numbness/tingling, dysuria, nausea, vomiting, diarrhea, or recent illnesses.  Patient lives at [facility] in independent living.  EMS were called due to her symptoms.  On EMS evaluation, her blood pressure was reportedly elevated and they noted that she had missed a few of her doses of her anti-hypertensives.  On arrival to the emergency department her blood pressure is within normal limits for her age, 7/74.  She denies any further lightheaded symptoms and is asymptomatic.  Her main complaint is that she is missing her purse, which she thinks was left in the ambulance.    Past Medical Hx Past Medical History:  Diagnosis Date  . Asthma   . Bradycardia    hx of it and fatigue with beta blockade  . Carotid artery disease ([place])    s/p carotid endarterectomy  . Coronary artery disease    a. s/p 2 vessel CABG 1989; b. echo 2003: nl; c. cath 2006: patent grafts, EF 65%; d. nuclear stress test 2007: no ischemia, EF 81%  . Degenerative disc disease   . Dyspnea   . History of hyperkalemia   . Hyperlipidemia   . Hypertension   . Mitral valve prolapse   . Norovirus 2014  . Osteoporosis   . Sleep  apnea   . Spinal stenosis in cervical region   . Stroke [facility]) 1980s   left brain    Problem List Patient Active Problem List   Diagnosis Date Noted  . 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 Acute lower UTI 09/24/2017  . UTI (urinary tract infection) 09/24/2017  . Vitamin D deficiency 03/08/2017  . Slow transit constipation 02/22/2017  . Low BP 11/11/2015  . Polyarthritis 08/05/2015  . Pulmonary fibrosis ([place]) 05/11/2015  . SOB (shortness of breath)   . Weakness   . 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 PAD (peripheral artery disease) ([place]) 02/09/2015  . 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 OP (osteoporosis) 02/09/2015  . Billowing mitral valve 02/09/2015  . Degeneration macular 02/09/2015  . Hypercholesteremia 02/09/2015  . Adult hypothyroidism 02/09/2015  . Cannot sleep 02/09/2015  . 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 Presence of aortocoronary bypass graft 02/09/2015  . 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 Esophagitis, reflux 02/09/2015  . Diverticulitis 02/09/2015  . Colon, diverticulosis 02/09/2015  . Constipation due to opioid therapy 02/09/2015  . CAD in native artery 02/09/2015  . Colitis 02/09/2015  . Allergic rhinitis 02/09/2015  . Back ache 02/09/2015  . COPD (chronic obstructive pulmonary disease) ([place]) 01/15/2015  . GERD (gastroesophageal reflux disease) 12/23/2014  . Spinal stenosis at L4-L5 level 12/16/2014  . Lumbar nerve root compression 12/16/2014  . Compression fracture of lumbar vertebra ([place]) 12/16/2014  . Lumbar canal stenosis 11/04/2013  . Neuritis or radiculitis due to rupture of lumbar intervertebral disc  11/04/2013  . DDD (degenerative disc disease), lumbar 11/04/2013  . Degeneration of intervertebral disc of lumbar region 11/04/2013  . Dermatophytosis of nail 03/31/2013  . Pain in limb 03/31/2013  . HTN (hypertension) 07/24/2011  . Hyperlipidemia 01/31/2010  . Carotid artery stenosis 12/21/2009  . Atherosclerosis of autologous vein coronary  artery bypass graft with stable angina pectoris ([place]) 05/25/2009  . Dyspnea on exertion 05/25/2009    Past Surgical Hx Past Surgical History:  Procedure Laterality Date  . ABDOMINAL HYSTERECTOMY    . BLADDER SUSPENSION    . CAROTID ENDARTERECTOMY     left  . CHOLECYSTECTOMY    . CORONARY ARTERY BYPASS GRAFT    . HIP SURGERY  02/2011   right    Medications Prior to Admission medications   Medication Sig Start Date End Date Taking? Authorizing Provider  albuterol (VENTOLIN HFA) 108 (90 Base) MCG/ACT inhaler  05/08/19   [provider]  amLODipine (NORVASC) 5 MG tablet Take 2 tablets (10 mg total) by mouth daily. Patient taking differently: Take 5 mg by mouth 2 (two) times daily.  12/06/17   Mar Daring, PA-C  aspirin EC 81 MG tablet Take 81 mg by mouth daily.    [provider]  atorvastatin (LIPITOR) 80 MG tablet Take 1 tablet (80 mg total) by mouth daily. 03/15/17   Minna Merritts, MD  Calcium Carbonate-Vit D-Min (CALCIUM 600+D PLUS MINERALS) 600-400 MG-UNIT TABS Take by mouth daily.     [provider]  Cholecalciferol (VITAMIN D3) 10000 units TABS Take 1,000 Units by mouth daily.     [provider]  fluticasone (FLONASE) 50 MCG/ACT nasal spray Place 2 sprays into both nostrils daily. As needed Patient not taking: Reported on 09/12/2019 08/22/19   Mar Daring, PA-C  fluticasone furoate-vilanterol (BREO ELLIPTA) 100-25 MCG/INH AEPB Inhale 1 puff into the lungs daily.    [provider]  ibuprofen (ADVIL) 200 MG tablet Take 200 mg by mouth every 6 (six) hours as needed.    [provider]  levothyroxine (SYNTHROID, LEVOTHROID) 75 MCG tablet Take 75 mcg by mouth daily before breakfast.  04/19/18   [provider]  linaclotide (LINZESS) 290 MCG CAPS capsule Take 290 mcg by mouth daily before breakfast.    [provider]  linaclotide (LINZESS) 72 MCG capsule Take 1 capsule (72 mcg total) by mouth  daily before breakfast. Patient not taking: Reported on 09/12/2019 01/07/19   Mar Daring, PA-C  LORazepam (ATIVAN) 0.5 MG tablet Take 0.5 mg by mouth at bedtime as needed for anxiety.    [provider]  Multiple Vitamins-Minerals (PRESERVISION AREDS PO) Take 1 capsule by mouth daily.     [provider]  nitroGLYCERIN (NITROSTAT) 0.4 MG SL tablet Place 1 tablet (0.4 mg total) under the tongue every 5 (five) minutes as needed. 02/07/16   Minna Merritts, MD  NONFORMULARY OR COMPOUNDED ITEM [facility]  Peripheral Neuropathy Cream- Bupivacaine 1%, Doxepin 3%, Gabapentin 6%, Pentoxifylline 3%, Topiramate 1% Apply 1-2 grams to affected area 3-4 times daily Qty. 120 gm 3 refills    [provider]  Nutritional Supplements (ENSURE HIGH PROTEIN) LIQD Drink one protein drink with breakfast daily 10/03/19   Mar Daring, PA-C  senna-docusate (SENOKOT-S) 8.6-50 MG tablet Take 2 tablets by mouth at bedtime as needed for mild constipation. 06/25/18   Mar Daring, PA-C  tiotropium (SPIRIVA) 18 MCG inhalation capsule Place 18 mcg into inhaler and inhale daily.  [provider]  traMADol (ULTRAM) 50 MG tablet Take 50 mg by mouth every 6 (six) hours as needed.    [provider]  vitamin C (ASCORBIC ACID) 500 MG tablet Take 500 mg by mouth daily.    [provider]    Allergies Sulfa antibiotics and Sulfonamide derivatives  Family Hx Family History  Problem Relation Age of Onset  . Throat cancer Mother   . Stroke Father     Social Hx Social History   Tobacco Use  . Smoking status: Former Smoker    Quit date: 02/22/1979    Years since quitting: 40.6  . Smokeless tobacco: Never Used  . Tobacco comment: quit 50 years ago  Substance Use Topics  . Alcohol use: Yes    Comment: 1 glass of wine rarely  . Drug use: No     Review of Systems  Constitutional: Negative for fever. Negative for chills.  Positive for  lightheadedness. Eyes: Negative for visual changes. ENT: Negative for sore throat. Cardiovascular: Negative for chest pain. Respiratory: Negative for shortness of breath. Gastrointestinal: Negative for nausea. Negative for vomiting.  Genitourinary: Negative for dysuria. Musculoskeletal: Negative for leg swelling. Skin: Negative for rash. Neurological: Negative for headaches.   Physical Exam  Vital Signs: ED Triage Vitals  Enc Vitals Group     BP 10/20/19 1148 (!) 145/70     Pulse Rate 10/20/19 1148 69     Resp 10/20/19 1148 16     Temp 10/20/19 1148 97.8 F (36.6 C)     Temp Source 10/20/19 1148 Oral     SpO2 10/20/19 1148 99 %     Weight --      Height --      Head Circumference --      Peak Flow --      Pain Score 10/20/19 1144 5     Pain Loc --      Pain Edu? --      Excl. in [place]? --     Constitutional: Alert and oriented. Well appearing. NAD.  Head: Normocephalic. Atraumatic. Eyes: Conjunctivae clear. Sclera anicteric. Pupils equal and symmetric. Nose: No masses or lesions. No congestion or rhinorrhea. Mouth/Throat: Wearing mask.  Neck: No stridor. Trachea midline.  Cardiovascular: Normal rate, regular rhythm. Extremities well perfused. Respiratory: Normal respiratory effort.  Lungs CTAB. Gastrointestinal: Soft. Non-distended. Non-tender.  Genitourinary: Deferred. Musculoskeletal: No lower extremity edema. No deformities. Neurologic:  Normal speech and language. No gross focal or lateralizing neurologic deficits are appreciated.  Skin: Skin is warm, dry and intact. No rash noted. Psychiatric: Mood and affect are appropriate for situation.  EKG  Personally reviewed and interpreted by myself.   Date: 10/20/19 Time: 1313 Rate: 68 Rhythm: sinus Axis: normal Intervals: WNL No acute ischemic changes, no evidence of prolonged QTC, Brugada, or WPW Appears unchanged from prior No STEMI      Procedures  Procedure(s) performed (including critical  care):  Procedures   Initial Impression / Assessment and Plan / MDM / ED Course  86 y.o. female who presents to the ED for an episode of lightheadedness this AM.  Symptoms have since resolved and patient is asymptomatic in the ED.  Reportedly hypertensive with EMS evaluation at her living facility, however on arrival to the ED her blood pressure is 147/74 and she is asymptomatic from this perspective as well.  Ddx: anemia, electrolyte abnormality, UTI, orthostatic, dehydration, carotid stenosis.  Her blood pressure in the emergency department is appropriate for her age.  She  is asymptomatic, therefore do not suspect any hypertensive emergency or urgency.  No headache, and neurological exam is normal & nonfocal, therefore doubt acute intracranial etiology. No hypoxia, tachypnea, or SOB to suggest PE.  Will plan for basic labs, EKG, urine studies, orthostatic vital signs.  EKG as above, no evidence of arrhythmia or ischemia, patient denies any chest pain or shortness of breath with episode, as noted above.  Clinical Course as of Oct 20 1635  Mon Oct 20, 2019  1228 Hemoglobin and creatinine consistent with priors.   [SM]  1305 Patient does appear to have positive orthostatic vital signs, will give IVF and encourage PO. Suspect this is the likely etiology of her symptoms.   [SM]  1341 Patient became lightheaded trying to ambulate back from the restroom, needed assistance to sit back into a wheelchair and then transferred to bed. Will continue with IVF hydration. If remains symptomatic after 2nd bolus may need to consider admission for fluids.    [SM]  [id number] Patient feeling improved after 2nd IVF bolus. Able to ambulate with walker (her baseline) without difficulty and w/o recurrence of symptoms. As such, feel patient is stable for discharge w/ outpatient follow up. Given return precautions.   [SM]    Clinical Course User Index [SM] Lilia Pro., MD      _______________________________   As part of my medical decision making I have reviewed available labs, radiology tests, reviewed old records/performed chart review.    Final Clinical Impression(s) / ED Diagnosis  Final diagnoses:  Lightheaded  Orthostatic lightheadedness       Note:  This document was prepared using Dragon voice recognition software and may include unintentional dictation errors.   Lilia Pro., MD 10/20/19 [phone]

## 2019-10-20 NOTE — ED Notes (Signed)
Unable to sign d/c signature due to failed [place]. Pt verbalized understanding of the d/c instructions. Denies any questions at this time

## 2019-10-20 NOTE — ED Notes (Signed)
First Nurse Note: Pt to ED via [facility] from [facility] for HBP. Pt has missed her meds 3 out of the last 4 days. Pts BP with EMS 168/74. Pt is in NAD.

## 2019-10-20 NOTE — ED Notes (Signed)
Ambulated pt to the restroom. On the way back to bed pt became dizzy. Placed her in a wheelchair to her bed. Pt is in bed resting. Urine sample was sent to lab. Reported to RN Caryl Pina and Dr. Joan Mayans.

## 2019-10-20 NOTE — ED Notes (Signed)
Rainbow sent to the lab.  

## 2019-10-20 NOTE — ED Notes (Signed)
Spoke to representative at [facility]. States that they will send someone to pick pt up within 15-20mins.

## 2019-10-20 NOTE — ED Notes (Signed)
Pt ambulatory to the restroom with walker and standby assistance.

## 2019-10-20 NOTE — Discharge Instructions (Addendum)
Thank you for letting us take care of you in the emergency department today.   Please continue to take any regular, prescribed medications. Continue to maintain a healthy diet and stay well hydrated.  Move slowly when going from laying to sitting and/or sitting to standing to let your body adjust.  Please follow up with: Your primary care doctor to review your ER visit and follow up on your symptoms.   Please return to the ER for any new or worsening symptoms.

## 2019-10-20 NOTE — ED Notes (Signed)
Placed pt on bed pan. Pt stated she wanted to stay on it for a while,because she keeps urinating.

## 2019-11-14 ENCOUNTER — Telehealth: Payer: Self-pay

## 2019-11-14 NOTE — Telephone Encounter (Signed)
Copied from [place] [id number]. Topic: General - Other >> Nov 14, 2019  9:56 AM Alanda Slim E wrote: Reason for CRM: Requesting PCP health and physical notes from last 2-3 visits/ wanted to confirm her fax was received and being processed/ please advise

## 2019-11-14 NOTE — Telephone Encounter (Signed)
Have you seen this fax?

## 2019-11-18 ENCOUNTER — Telehealth: Payer: Self-pay

## 2019-11-18 NOTE — Telephone Encounter (Signed)
Faxes to [phone], continue to fail. Will call Debbie to see if there is another fax#. Thanks TNP

## 2019-11-18 NOTE — Telephone Encounter (Signed)
Debbie received a fax but she only received the first page and the rest didn't come through / She needs last few OV notes for Pt / please advise and call when re sent

## 2019-11-18 NOTE — Telephone Encounter (Signed)
Copied from [place] [id number]. Topic: General - Call Back - No Documentation >> Nov 18, 2019  1:37 PM Erick Blinks wrote: Jackelyn Poling, from [facility] called to report that they are having trouble receiving a fax document for this patient. Only the cover letter is transmitted. Please advise. Must be resolved by today, pt has an appt tomorrow.   [phone]

## 2019-11-19 ENCOUNTER — Telehealth: Payer: Self-pay

## 2019-11-19 NOTE — Telephone Encounter (Signed)
Fleet Contras with [facility] came by to pick up faxes since complete fax wouldn't transmit. Thanks TNP

## 2019-11-19 NOTE — Telephone Encounter (Signed)
Copied from [place] [id number]. Topic: General - Call Back - No Documentation >> Nov 18, 2019  1:37 PM Erick Blinks wrote: Jackelyn Poling, from [facility] called to report that they are having trouble receiving a fax document for this patient. Only the cover letter is transmitted. Please advise. Must be resolved by today, pt has an appt tomorrow.   [phone] >> Nov 18, 2019  4:29 PM Jodie Echevaria wrote: Jackelyn Poling called back to say that fax is still not coming through only the first page. She is asking if possible can form be scanned and emailed to her secured email of [email or web address] and would like a call back at Ph# [phone]

## 2019-11-26 ENCOUNTER — Emergency Department
Admission: EM | Admit: 2019-11-26 | Discharge: 2019-11-26 | Disposition: A | Discharge status: Skilled Nursing Facility | Payer: Medicare Other | Attending: Student in an Organized Health Care Education/Training Program | Admitting: Student in an Organized Health Care Education/Training Program

## 2019-11-26 ENCOUNTER — Other Ambulatory Visit: Payer: Self-pay

## 2019-11-26 ENCOUNTER — Encounter: Payer: Self-pay | Admitting: Emergency Medicine

## 2019-11-26 ENCOUNTER — Emergency Department: Payer: Medicare Other

## 2019-11-26 DIAGNOSIS — R069 Unspecified abnormalities of breathing: Secondary | ICD-10-CM | POA: Diagnosis not present

## 2019-11-26 DIAGNOSIS — I2510 Atherosclerotic heart disease of native coronary artery without angina pectoris: Secondary | ICD-10-CM | POA: Insufficient documentation

## 2019-11-26 DIAGNOSIS — Z951 Presence of aortocoronary bypass graft: Secondary | ICD-10-CM | POA: Diagnosis not present

## 2019-11-26 DIAGNOSIS — Z79899 Other long term (current) drug therapy: Secondary | ICD-10-CM | POA: Insufficient documentation

## 2019-11-26 DIAGNOSIS — I10 Essential (primary) hypertension: Secondary | ICD-10-CM | POA: Diagnosis not present

## 2019-11-26 DIAGNOSIS — Z7982 Long term (current) use of aspirin: Secondary | ICD-10-CM | POA: Insufficient documentation

## 2019-11-26 DIAGNOSIS — R0602 Shortness of breath: Secondary | ICD-10-CM | POA: Insufficient documentation

## 2019-11-26 DIAGNOSIS — R0902 Hypoxemia: Secondary | ICD-10-CM | POA: Diagnosis not present

## 2019-11-26 DIAGNOSIS — I739 Peripheral vascular disease, unspecified: Secondary | ICD-10-CM | POA: Diagnosis not present

## 2019-11-26 DIAGNOSIS — E039 Hypothyroidism, unspecified: Secondary | ICD-10-CM | POA: Insufficient documentation

## 2019-11-26 DIAGNOSIS — J449 Chronic obstructive pulmonary disease, unspecified: Secondary | ICD-10-CM | POA: Insufficient documentation

## 2019-11-26 DIAGNOSIS — R42 Dizziness and giddiness: Secondary | ICD-10-CM | POA: Diagnosis not present

## 2019-11-26 LAB — TROPONIN I (HIGH SENSITIVITY)
Troponin I (High Sensitivity): 12 ng/L (ref <18)
Troponin I (High Sensitivity): 11 ng/L (ref <18)

## 2019-11-26 LAB — COMPREHENSIVE METABOLIC PANEL
ALT: 16 U/L (ref 0–44)
AST: 27 U/L (ref 15–41)
Albumin: 4.1 g/dL (ref 3.5–5.0)
Alkaline Phosphatase: 101 U/L (ref 38–126)
Anion gap: 11 (ref 5–15)
BUN: 25 mg/dL — ABNORMAL HIGH (ref 8–23)
CO2: 27 mmol/L (ref 22–32)
Calcium: 9.3 mg/dL (ref 8.9–10.3)
Chloride: 105 mmol/L (ref 98–111)
Creatinine, Ser: 1.2 mg/dL — ABNORMAL HIGH (ref 0.44–1.00)
GFR calc Af Amer: 45 mL/min — ABNORMAL LOW (ref >60)
GFR calc non Af Amer: 39 mL/min — ABNORMAL LOW (ref >60)
Glucose, Bld: 106 mg/dL — ABNORMAL HIGH (ref 70–99)
Potassium: 4.1 mmol/L (ref 3.5–5.1)
Sodium: 143 mmol/L (ref 135–145)
Total Bilirubin: 0.7 mg/dL (ref 0.3–1.2)
Total Protein: 7.9 g/dL (ref 6.5–8.1)

## 2019-11-26 LAB — CBC WITH DIFFERENTIAL/PLATELET
Abs Immature Granulocytes: 0.02 10*3/uL (ref 0.00–0.07)
Basophils Absolute: 0 10*3/uL (ref 0.0–0.1)
Basophils Relative: 1 %
Eosinophils Absolute: 0.1 10*3/uL (ref 0.0–0.5)
Eosinophils Relative: 2 %
HCT: 34.5 % — ABNORMAL LOW (ref 36.0–46.0)
Hemoglobin: 10.9 g/dL — ABNORMAL LOW (ref 12.0–15.0)
Immature Granulocytes: 0 %
Lymphocytes Relative: 15 %
Lymphs Abs: 0.8 10*3/uL (ref 0.7–4.0)
MCH: 29.4 pg (ref 26.0–34.0)
MCHC: 31.6 g/dL (ref 30.0–36.0)
MCV: 93 fL (ref 80.0–100.0)
Monocytes Absolute: 0.4 10*3/uL (ref 0.1–1.0)
Monocytes Relative: 7 %
Neutro Abs: 3.8 10*3/uL (ref 1.7–7.7)
Neutrophils Relative %: 75 %
Platelets: 274 10*3/uL (ref 150–400)
RBC: 3.71 MIL/uL — ABNORMAL LOW (ref 3.87–5.11)
RDW: 15.5 % (ref 11.5–15.5)
WBC: 5.1 10*3/uL (ref 4.0–10.5)
nRBC: 0 % (ref 0.0–0.2)

## 2019-11-26 MED ORDER — IPRATROPIUM-ALBUTEROL 0.5-2.5 (3) MG/3ML IN SOLN
3.0000 mL | Freq: Once | RESPIRATORY_TRACT | Status: AC
  Administered 2019-11-26: 3 mL via RESPIRATORY_TRACT
  Filled 2019-11-26: qty 3

## 2019-11-26 NOTE — ED Notes (Signed)
[facility] Caregiver at bedside.

## 2019-11-26 NOTE — ED Triage Notes (Addendum)
Pt from [facility]. Per EMS pt c/o SHOB 3 hours ago with dizziness when standing. Pt used albuterol inhaler at home w/o relief.  Denies CP. Pt uses O2 PRN. Pt sating  98% RA upon arrival. NAD.  HOH

## 2019-11-26 NOTE — ED Notes (Signed)
DNR- yellow form given and received by pt. Pt A/ox4. NAD upon DC.

## 2019-11-26 NOTE — ED Notes (Signed)
This RN spoke with lab requesting lab results.

## 2019-11-26 NOTE — ED Notes (Signed)
This RN spoke with [facility] security, Jeanell Sparrow. [phone] who st "someone will get there in 1 hour". Pt made aware.

## 2019-11-26 NOTE — ED Notes (Addendum)
Pt able to ambulate to toilet with 1 staff assistance. NAD noted.  Pt sat at 100% RA.

## 2019-11-26 NOTE — ED Notes (Signed)
Pt ambulatory to toilet with this RN assistance. NAD.

## 2019-11-26 NOTE — ED Provider Notes (Signed)
[facility] Emergency Department Provider Note    First MD Initiated Contact with Patient 11/26/19 [phone]     (approximate)  I have reviewed the triage vital signs and the nursing notes.   HISTORY  Chief Complaint Shortness of Breath    HPI Chelsea [name] is a 86 y.o. female to ER for evaluation of shortness of breath started this morning.  Says she took inhaler and is already feeling better.  States she has chronic shortness of breath.  Wears O2 as needed.  She denies any pain or pressure at this time.  Denies any nausea or vomiting.  No recent fevers.    Past Medical History:  Diagnosis Date  . Asthma   . Bradycardia    hx of it and fatigue with beta blockade  . Carotid artery disease ([place])    s/p carotid endarterectomy  . Coronary artery disease    a. s/p 2 vessel CABG 1989; b. echo 2003: nl; c. cath 2006: patent grafts, EF 65%; d. nuclear stress test 2007: no ischemia, EF 81%  . Degenerative disc disease   . Dyspnea   . History of hyperkalemia   . Hyperlipidemia   . Hypertension   . Mitral valve prolapse   . Norovirus 2014  . Osteoporosis   . Sleep apnea   . Spinal stenosis in cervical region   . Stroke [facility]) 1980s   left brain   Family History  Problem Relation Age of Onset  . Throat cancer Mother   . Stroke Father    Past Surgical History:  Procedure Laterality Date  . ABDOMINAL HYSTERECTOMY    . BLADDER SUSPENSION    . CAROTID ENDARTERECTOMY     left  . CHOLECYSTECTOMY    . CORONARY ARTERY BYPASS GRAFT    . HIP SURGERY  02/2011   right   Patient Active Problem List   Diagnosis Date Noted  . 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 Acute lower UTI 09/24/2017  . UTI (urinary tract infection) 09/24/2017  . Vitamin D deficiency 03/08/2017  . Slow transit constipation 02/22/2017  . Low BP 11/11/2015  . Polyarthritis 08/05/2015  . Pulmonary fibrosis ([place]) 05/11/2015  . SOB (shortness of breath)   . Weakness   .  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 PAD (peripheral artery disease) ([place]) 02/09/2015  . 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 OP (osteoporosis) 02/09/2015  . Billowing mitral valve 02/09/2015  . Degeneration macular 02/09/2015  . Hypercholesteremia 02/09/2015  . Adult hypothyroidism 02/09/2015  . Cannot sleep 02/09/2015  . 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 Presence of aortocoronary bypass graft 02/09/2015  . 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 Esophagitis, reflux 02/09/2015  . Diverticulitis 02/09/2015  . Colon, diverticulosis 02/09/2015  . Constipation due to opioid therapy 02/09/2015  . CAD in native artery 02/09/2015  . Colitis 02/09/2015  . Allergic rhinitis 02/09/2015  . Back ache 02/09/2015  . COPD (chronic obstructive pulmonary disease) ([place]) 01/15/2015  . GERD (gastroesophageal reflux disease) 12/23/2014  . Spinal stenosis at L4-L5 level 12/16/2014  . Lumbar nerve root compression 12/16/2014  . Compression fracture of lumbar vertebra ([place]) 12/16/2014  . Lumbar canal stenosis 11/04/2013  . Neuritis or radiculitis due to rupture of lumbar intervertebral disc 11/04/2013  . DDD (degenerative disc disease), lumbar 11/04/2013  . Degeneration of intervertebral disc of lumbar region 11/04/2013  . Dermatophytosis of nail 03/31/2013  . Pain in limb 03/31/2013  . HTN (hypertension) 07/24/2011  . Hyperlipidemia 01/31/2010  . Carotid artery  stenosis 12/21/2009  . Atherosclerosis of autologous vein coronary artery bypass graft with stable angina pectoris ([place]) 05/25/2009  . Dyspnea on exertion 05/25/2009      Prior to Admission medications   Medication Sig Start Date End Date Taking? Authorizing Provider  amLODipine (NORVASC) 5 MG tablet Take 2 tablets (10 mg total) by mouth daily. Patient taking differently: Take 5 mg by mouth daily.  12/06/17  Yes Mar Daring, PA-C  aspirin EC 81 MG tablet Take 81 mg by mouth daily.   Yes [provider]  atorvastatin (LIPITOR)  80 MG tablet Take 1 tablet (80 mg total) by mouth daily. 03/15/17  Yes Gollan, Kathlene November, MD  fluticasone furoate-vilanterol (BREO ELLIPTA) 100-25 MCG/INH AEPB Inhale 1 puff into the lungs daily.   Yes [provider]  levothyroxine (SYNTHROID, LEVOTHROID) 75 MCG tablet Take 75 mcg by mouth daily before breakfast.  04/19/18  Yes [provider]  linaclotide (LINZESS) 290 MCG CAPS capsule Take 290 mcg by mouth daily before breakfast.   Yes [provider]  Vitamin D, Ergocalciferol, (DRISDOL) 1.25 MG (50000 UNIT) CAPS capsule  10/13/19  Yes [provider]  Cholecalciferol (VITAMIN D3) 10000 units TABS Take 1,000 Units by mouth daily.     [provider]  Multiple Vitamins-Minerals (PRESERVISION AREDS PO) Take 1 capsule by mouth daily.     [provider]  nitroGLYCERIN (NITROSTAT) 0.4 MG SL tablet Place 1 tablet (0.4 mg total) under the tongue every 5 (five) minutes as needed. 02/07/16   Minna Merritts, MD  Nutritional Supplements (ENSURE HIGH PROTEIN) LIQD Drink one protein drink with breakfast daily 10/03/19   Mar Daring, PA-C  senna-docusate (SENOKOT-S) 8.6-50 MG tablet Take 2 tablets by mouth at bedtime as needed for mild constipation. Patient not taking: Reported on 10/20/2019 06/25/18   Mar Daring, PA-C  vitamin C (ASCORBIC ACID) 500 MG tablet Take 500 mg by mouth daily.    [provider]    Allergies Sulfa antibiotics and Sulfonamide derivatives    Social History Social History   Tobacco Use  . Smoking status: Former Smoker    Quit date: 02/22/1979    Years since quitting: 40.7  . Smokeless tobacco: Never Used  . Tobacco comment: quit 50 years ago  Substance Use Topics  . Alcohol use: Yes    Comment: 1 glass of wine rarely  . Drug use: No    Review of Systems Patient denies headaches, rhinorrhea, blurry vision, numbness, shortness of breath, chest pain, edema, cough, abdominal pain, nausea, vomiting,  diarrhea, dysuria, fevers, rashes or hallucinations unless otherwise stated above in HPI. ____________________________________________   PHYSICAL EXAM:  VITAL SIGNS: Vitals:   11/26/19 1115 11/26/19 1130  BP:  (!) 152/75  Pulse: 63 71  Resp: 19 12  Temp:    SpO2: 100% 100%    Constitutional: Alert and oriented.  Eyes: Conjunctivae are normal.  Head: Atraumatic. Nose: No congestion/rhinnorhea. Mouth/Throat: Mucous membranes are moist.   Neck: No stridor. Painless ROM.  Cardiovascular: Normal rate, regular rhythm. Grossly normal heart sounds.  Good peripheral circulation. Respiratory: Normal respiratory effort.  No retractions. Lungs with faint expiratory wheeze Gastrointestinal: Soft and nontender. No distention. No abdominal bruits. No CVA tenderness. Genitourinary:  Musculoskeletal: No lower extremity tenderness nor edema.  No joint effusions. Neurologic:  Normal speech and language. No gross focal neurologic deficits are appreciated. No facial droop Skin:  Skin is warm, dry and intact. No rash noted. Psychiatric: Mood and affect  are normal. Speech and behavior are normal.  ____________________________________________   LABS (all labs ordered are listed, but only abnormal results are displayed)  Results for orders placed or performed during the hospital encounter of 11/26/19 (from the past 24 hour(s))  CBC with Differential/Platelet     Status: Abnormal   Collection Time: 11/26/19 10:35 AM  Result Value Ref Range   WBC 5.1 4.0 - 10.5 K/uL   RBC 3.71 (L) 3.87 - 5.11 MIL/uL   Hemoglobin 10.9 (L) 12.0 - 15.0 g/dL   HCT 34.5 (L) 36.0 - 46.0 %   MCV 93.0 80.0 - 100.0 fL   MCH 29.4 26.0 - 34.0 pg   MCHC 31.6 30.0 - 36.0 g/dL   RDW 15.5 11.5 - 15.5 %   Platelets 274 150 - 400 K/uL   nRBC 0.0 0.0 - 0.2 %   Neutrophils Relative % 75 %   Neutro Abs 3.8 1.7 - 7.7 K/uL   Lymphocytes Relative 15 %   Lymphs Abs 0.8 0.7 - 4.0 K/uL   Monocytes Relative 7 %   Monocytes Absolute  0.4 0.1 - 1.0 K/uL   Eosinophils Relative 2 %   Eosinophils Absolute 0.1 0.0 - 0.5 K/uL   Basophils Relative 1 %   Basophils Absolute 0.0 0.0 - 0.1 K/uL   Immature Granulocytes 0 %   Abs Immature Granulocytes 0.02 0.00 - 0.07 K/uL  Comprehensive metabolic panel     Status: Abnormal   Collection Time: 11/26/19 10:35 AM  Result Value Ref Range   Sodium 143 135 - 145 mmol/L   Potassium 4.1 3.5 - 5.1 mmol/L   Chloride 105 98 - 111 mmol/L   CO2 27 22 - 32 mmol/L   Glucose, Bld 106 (H) 70 - 99 mg/dL   BUN 25 (H) 8 - 23 mg/dL   Creatinine, Ser 1.20 (H) 0.44 - 1.00 mg/dL   Calcium 9.3 8.9 - 10.3 mg/dL   Total Protein 7.9 6.5 - 8.1 g/dL   Albumin 4.1 3.5 - 5.0 g/dL   AST 27 15 - 41 U/L   ALT 16 0 - 44 U/L   Alkaline Phosphatase 101 38 - 126 U/L   Total Bilirubin 0.7 0.3 - 1.2 mg/dL   GFR calc non Af Amer 39 (L) >60 mL/min   GFR calc Af Amer 45 (L) >60 mL/min   Anion gap 11 5 - 15  Troponin I (High Sensitivity)     Status: None   Collection Time: 11/26/19 10:35 AM  Result Value Ref Range   Troponin I (High Sensitivity) 12 <18 ng/L  Troponin I (High Sensitivity)     Status: None   Collection Time: 11/26/19 12:03 PM  Result Value Ref Range   Troponin I (High Sensitivity) 11 <18 ng/L   ____________________________________________  EKG My review and personal interpretation at Time: 10:26   Indication: sob  Rate: 60  Rhythm: sinus Axis: normal Other: normal intervals, non specific st abn, no stemi, c/w previous tracings ____________________________________________  RADIOLOGY  I personally reviewed all radiographic images ordered to evaluate for the above acute complaints and reviewed radiology reports and findings.  These findings were personally discussed with the patient.  Please see medical record for radiology report.  ____________________________________________   PROCEDURES  Procedure(s) performed:  Procedures    Critical Care performed:  no ____________________________________________   INITIAL IMPRESSION / ASSESSMENT AND PLAN / ED COURSE  Pertinent labs & imaging results that were available during my care of the patient were reviewed by  me and considered in my medical decision making (see chart for details).   DDX: Asthma, copd, CHF, pna, ptx, malignancy, Pe, anemia   Chelsea [name] is a 86 y.o. who presents to the ED with complaint of shortness of breath occurred this morning.  Denies any symptoms at this time.  Did get relief after albuterol.  She does have as needed oxygen was not requiring oxygen.  Her exam is reassuring.  No respiratory distress she speaking complete sentences.  Blood work we ordered for the but differential.  Doubt CHF, ACS, PE or dissection given her well appearance.  The patient will be placed on continuous pulse oximetry and telemetry for monitoring.  Laboratory evaluation will be sent to evaluate for the above complaints.     Clinical Course as of Nov 25 1257  Wed Nov 26, 2019  1241 Patient reassessed.  She remains symptom-free no hypoxia.  Troponin stable.  Patient requesting discharge home which I think is reasonable.  She does have inhalers at home I do suspect a component of bronchitis which resolved.  Do not feel that she requires prednisone at this time..  I think is appropriate for outpatient follow-up   [PR]    Clinical Course User Index [PR] Merlyn Lot, MD    The patient was evaluated in Emergency Department today for the symptoms described in the history of present illness. He/she was evaluated in the context of the global COVID-19 pandemic, which necessitated consideration that the patient might be at risk for infection with the SARS-CoV-2 virus that causes COVID-19. Institutional protocols and algorithms that pertain to the evaluation of patients at risk for COVID-19 are in a state of rapid change based on information released by regulatory bodies including the CDC and federal  and state organizations. These policies and algorithms were followed during the patient's care in the ED.  As part of my medical decision making, I reviewed the following data within the [place] notes reviewed and incorporated, Labs reviewed, notes from prior ED visits and [place] Controlled Substance Database   ____________________________________________   FINAL CLINICAL IMPRESSION(S) / ED DIAGNOSES  Final diagnoses:  SOB (shortness of breath)      NEW MEDICATIONS STARTED DURING THIS VISIT:  New Prescriptions   No medications on file     Note:  This document was prepared using Dragon voice recognition software and may include unintentional dictation errors.    Merlyn Lot, MD 11/26/19 1259

## 2019-11-26 NOTE — ED Notes (Signed)
Pt taken to Xray.

## 2019-11-28 ENCOUNTER — Other Ambulatory Visit: Payer: Self-pay

## 2019-11-28 ENCOUNTER — Ambulatory Visit (INDEPENDENT_AMBULATORY_CARE_PROVIDER_SITE_OTHER): Payer: Medicare Other | Admitting: Physician Assistant

## 2019-11-28 ENCOUNTER — Encounter: Payer: Self-pay | Admitting: Physician Assistant

## 2019-11-28 VITALS — BP 137/67 | HR 78 | Temp 97.1°F | Resp 16 | Wt 97.0 lb

## 2019-11-28 DIAGNOSIS — J432 Centrilobular emphysema: Secondary | ICD-10-CM

## 2019-11-28 DIAGNOSIS — R0602 Shortness of breath: Secondary | ICD-10-CM

## 2019-11-28 MED ORDER — IPRATROPIUM-ALBUTEROL 0.5-2.5 (3) MG/3ML IN SOLN: 3.0000 mL | RESPIRATORY_TRACT | 11 refills | Status: AC | PRN

## 2019-11-28 NOTE — Progress Notes (Signed)
Established patient visit   Patient: Chelsea [name]   DOB: 06-May-1924   86 y.o. Female  MRN: [id number] Visit Date: 11/28/2019  Today's healthcare provider: Mar Daring, PA-C   Chief Complaint  Patient presents with  . Follow-up    ER   Subjective    HPI Follow up ER visit  Patient was seen in ER for shortness of breath on 11/27/2019. She was treated for shortness of breath. Treatment for this included patient is using Albuterol inhaler several times a day. She reports excellent compliance with treatment. She reports this condition is Improved.  Reports she does ok, but then will have SOB that can last for a short while, then will improve. Albuterol inhaler helps some.  -----------------------------------------------------------------------------------------    Patient Active Problem List   Diagnosis Date Noted  . 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 Acute lower UTI 09/24/2017  . UTI (urinary tract infection) 09/24/2017  . Vitamin D deficiency 03/08/2017  . Slow transit constipation 02/22/2017  . Low BP 11/11/2015  . Polyarthritis 08/05/2015  . Pulmonary fibrosis ([place]) 05/11/2015  . SOB (shortness of breath)   . Weakness   . 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 PAD (peripheral artery disease) ([place]) 02/09/2015  . 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 OP (osteoporosis) 02/09/2015  . Billowing mitral valve 02/09/2015  . Degeneration macular 02/09/2015  . Hypercholesteremia 02/09/2015  . Adult hypothyroidism 02/09/2015  . Cannot sleep 02/09/2015  . 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 Presence of aortocoronary bypass graft 02/09/2015  . 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 Esophagitis, reflux 02/09/2015  . Diverticulitis 02/09/2015  . Colon, diverticulosis 02/09/2015  . Constipation due to opioid therapy 02/09/2015  . CAD in native artery 02/09/2015  . Colitis 02/09/2015  . Allergic rhinitis 02/09/2015  . Back ache 02/09/2015  . COPD (chronic  obstructive pulmonary disease) ([place]) 01/15/2015  . GERD (gastroesophageal reflux disease) 12/23/2014  . Spinal stenosis at L4-L5 level 12/16/2014  . Lumbar nerve root compression 12/16/2014  . Compression fracture of lumbar vertebra ([place]) 12/16/2014  . Lumbar canal stenosis 11/04/2013  . Neuritis or radiculitis due to rupture of lumbar intervertebral disc 11/04/2013  . DDD (degenerative disc disease), lumbar 11/04/2013  . Degeneration of intervertebral disc of lumbar region 11/04/2013  . Dermatophytosis of nail 03/31/2013  . Pain in limb 03/31/2013  . HTN (hypertension) 07/24/2011  . Hyperlipidemia 01/31/2010  . Carotid artery stenosis 12/21/2009  . Atherosclerosis of autologous vein coronary artery bypass graft with stable angina pectoris ([place]) 05/25/2009  . Dyspnea on exertion 05/25/2009   Social History   Tobacco Use  . Smoking status: Former Smoker    Quit date: 02/22/1979    Years since quitting: 40.7  . Smokeless tobacco: Never Used  . Tobacco comment: quit 50 years ago  Vaping Use  . Vaping Use: Never used  Substance Use Topics  . Alcohol use: Yes    Comment: 1 glass of wine rarely  . Drug use: No       Medications: Outpatient Medications Prior to Visit  Medication Sig  . amLODipine (NORVASC) 5 MG tablet Take 2 tablets (10 mg total) by mouth daily. (Patient taking differently: Take 5 mg by mouth daily. )  . aspirin EC 81 MG tablet Take 81 mg by mouth daily.  Marland Kitchen atorvastatin (LIPITOR) 80 MG tablet Take 1 tablet (80 mg total) by mouth daily.  . Cholecalciferol (VITAMIN D3) 10000 units TABS Take 1,000 Units by mouth daily.   Marland Kitchen levothyroxine (SYNTHROID,  LEVOTHROID) 75 MCG tablet Take 75 mcg by mouth daily before breakfast.   . linaclotide (LINZESS) 290 MCG CAPS capsule Take 290 mcg by mouth daily before breakfast.  . Multiple Vitamins-Minerals (PRESERVISION AREDS PO) Take 1 capsule by mouth daily.   . vitamin C (ASCORBIC ACID) 500 MG tablet Take 500 mg by mouth daily.  .  Vitamin D, Ergocalciferol, (DRISDOL) 1.25 MG (50000 UNIT) CAPS capsule   . fluticasone furoate-vilanterol (BREO ELLIPTA) 100-25 MCG/INH AEPB Inhale 1 puff into the lungs daily.  . nitroGLYCERIN (NITROSTAT) 0.4 MG SL tablet Place 1 tablet (0.4 mg total) under the tongue every 5 (five) minutes as needed. (Patient not taking: Reported on 11/28/2019)  . Nutritional Supplements (ENSURE HIGH PROTEIN) LIQD Drink one protein drink with breakfast daily (Patient not taking: Reported on 11/28/2019)  . senna-docusate (SENOKOT-S) 8.6-50 MG tablet Take 2 tablets by mouth at bedtime as needed for mild constipation. (Patient not taking: Reported on 10/20/2019)   No facility-administered medications prior to visit.    Review of Systems  Constitutional: Negative for chills and fever.  HENT: Negative.   Respiratory: Positive for shortness of breath and wheezing.   Cardiovascular: Negative.   Neurological: Positive for weakness.    Last CBC Lab Results  Component Value Date   WBC 5.1 11/26/2019   HGB 10.9 (L) 11/26/2019   HCT 34.5 (L) 11/26/2019   MCV 93.0 11/26/2019   MCH 29.4 11/26/2019   RDW 15.5 11/26/2019   PLT 274 16/03/9603   Last metabolic panel Lab Results  Component Value Date   GLUCOSE 106 (H) 11/26/2019   NA 143 11/26/2019   K 4.1 11/26/2019   CL 105 11/26/2019   CO2 27 11/26/2019   BUN 25 (H) 11/26/2019   CREATININE 1.20 (H) 11/26/2019   GFRNONAA 39 (L) 11/26/2019   GFRAA 45 (L) 11/26/2019   CALCIUM 9.3 11/26/2019   PROT 7.9 11/26/2019   ALBUMIN 4.1 11/26/2019   LABGLOB 2.5 09/13/2016   AGRATIO 1.6 09/13/2016   BILITOT 0.7 11/26/2019   ALKPHOS 101 11/26/2019   AST 27 11/26/2019   ALT 16 11/26/2019   ANIONGAP 11 11/26/2019      Objective    BP 137/67 (BP Location: Left Arm, Patient Position: Sitting, Cuff Size: Normal)   Pulse 78   Temp (!) 97.1 F (36.2 C) (Temporal)   Resp 16   Wt 97 lb (44 kg)   SpO2 98%   BMI 15.19 kg/m  BP Readings from Last 3 Encounters:    11/28/19 137/67  11/26/19 (!) 135/96  10/20/19 (!) 158/85   Wt Readings from Last 3 Encounters:  11/28/19 97 lb (44 kg)  11/26/19 125 lb (56.7 kg)  09/12/19 105 lb 3.2 oz (47.7 kg)      Physical Exam Vitals reviewed.  Constitutional:      General: She is not in acute distress.    Appearance: Normal appearance. She is well-developed. She is cachectic. She is not ill-appearing or diaphoretic.     Comments: Very kyphotic in appearance and scoliosis noted  Neck:     Thyroid: No thyromegaly.     Vascular: No JVD.     Trachea: No tracheal deviation.  Cardiovascular:     Rate and Rhythm: Normal rate and regular rhythm.     Heart sounds: Normal heart sounds. No murmur heard.  No friction rub. No gallop.   Pulmonary:     Effort: Pulmonary effort is normal. No respiratory distress.     Breath sounds: Decreased air  movement present. Decreased breath sounds present. No wheezing or rales.  Chest:     Chest wall: No tenderness.  Musculoskeletal:     Cervical back: Normal range of motion and neck supple.  Lymphadenopathy:     Cervical: No cervical adenopathy.  Neurological:     Mental Status: She is alert.  Psychiatric:        Behavior: Behavior is cooperative.      No results found for any visits on 11/28/19.  Assessment & Plan     1. Shortness of breath Will get Chest CT to confirm no other abnormalities. CXR had been unremarkable, known emphysema. Will get a nebulizer machine for patient to have as I feel that due to her kyphotic posturing she is unable to pull the albuterol inhaler medication down into her lungs adequately. I feel she would benefit better by having nebulizer treatments instead. She can use the nebulizer every 4-6 hours with Duoneb as below for shortness of breath and emphysema. Call if symptoms persist or worsen.  - CT Chest Wo Contrast; Future - For home use only DME Nebulizer machine - ipratropium-albuterol (DUONEB) 0.5-2.5 (3) MG/3ML SOLN; Take 3 mLs by  nebulization every 4 (four) hours as needed.  Dispense: 360 mL; Refill: 11  2. Centrilobular emphysema ([place]) See above medical treatment plan. - ipratropium-albuterol (DUONEB) 0.5-2.5 (3) MG/3ML SOLN; Take 3 mLs by nebulization every 4 (four) hours as needed.  Dispense: 360 mL; Refill: 11   No follow-ups on file.      Reynolds Bowl, PA-C, have reviewed all documentation for this visit. The documentation on 12/01/19 for the exam, diagnosis, procedures, and orders are all accurate and complete.   [place]  [facility] [phone] (phone) [phone] (fax)  [place]

## 2019-12-01 ENCOUNTER — Encounter: Payer: Self-pay | Admitting: Physician Assistant

## 2019-12-05 ENCOUNTER — Telehealth: Payer: Self-pay

## 2019-12-05 NOTE — Telephone Encounter (Signed)
Can you check to see if this is in your pile please. I'm pretty sure it was faxed yesterday. If yes can we re fax please.

## 2019-12-05 NOTE — Telephone Encounter (Signed)
This was faxed yesterday

## 2019-12-05 NOTE — Telephone Encounter (Signed)
Copied from [place] [id number]. Topic: General - Other >> Dec 05, 2019  9:10 AM Rainey Pines A wrote: [facility] called to inform they are faxing over shortly some paperwork for [facility] to fill out for patient. Please advise >> Dec 05, 2019  9:19 AM Yvette Rack wrote: Pt daughter called to request that the paperwork be completed asap due to pt needing to be moved to another location within the facility where she lives.

## 2019-12-08 DIAGNOSIS — M4800 Spinal stenosis, site unspecified: Secondary | ICD-10-CM | POA: Diagnosis not present

## 2019-12-08 DIAGNOSIS — M546 Pain in thoracic spine: Secondary | ICD-10-CM | POA: Diagnosis not present

## 2019-12-08 DIAGNOSIS — Z741 Need for assistance with personal care: Secondary | ICD-10-CM | POA: Diagnosis not present

## 2019-12-08 DIAGNOSIS — F39 Unspecified mood [affective] disorder: Secondary | ICD-10-CM | POA: Diagnosis not present

## 2019-12-08 DIAGNOSIS — R4189 Other symptoms and signs involving cognitive functions and awareness: Secondary | ICD-10-CM | POA: Diagnosis not present

## 2019-12-08 DIAGNOSIS — R262 Difficulty in walking, not elsewhere classified: Secondary | ICD-10-CM | POA: Diagnosis not present

## 2019-12-08 DIAGNOSIS — R2681 Unsteadiness on feet: Secondary | ICD-10-CM | POA: Diagnosis not present

## 2019-12-08 DIAGNOSIS — I10 Essential (primary) hypertension: Secondary | ICD-10-CM | POA: Diagnosis not present

## 2019-12-09 DIAGNOSIS — M546 Pain in thoracic spine: Secondary | ICD-10-CM | POA: Diagnosis not present

## 2019-12-09 DIAGNOSIS — J439 Emphysema, unspecified: Secondary | ICD-10-CM | POA: Diagnosis not present

## 2019-12-09 DIAGNOSIS — I10 Essential (primary) hypertension: Secondary | ICD-10-CM | POA: Diagnosis not present

## 2019-12-09 DIAGNOSIS — I2510 Atherosclerotic heart disease of native coronary artery without angina pectoris: Secondary | ICD-10-CM | POA: Diagnosis not present

## 2019-12-09 DIAGNOSIS — R262 Difficulty in walking, not elsewhere classified: Secondary | ICD-10-CM | POA: Diagnosis not present

## 2019-12-09 DIAGNOSIS — R2681 Unsteadiness on feet: Secondary | ICD-10-CM | POA: Diagnosis not present

## 2019-12-09 DIAGNOSIS — G3184 Mild cognitive impairment, so stated: Secondary | ICD-10-CM | POA: Diagnosis not present

## 2019-12-09 DIAGNOSIS — E441 Mild protein-calorie malnutrition: Secondary | ICD-10-CM | POA: Diagnosis not present

## 2019-12-09 DIAGNOSIS — M4800 Spinal stenosis, site unspecified: Secondary | ICD-10-CM | POA: Diagnosis not present

## 2019-12-09 DIAGNOSIS — R531 Weakness: Secondary | ICD-10-CM | POA: Diagnosis not present

## 2019-12-09 DIAGNOSIS — F39 Unspecified mood [affective] disorder: Secondary | ICD-10-CM | POA: Diagnosis not present

## 2019-12-09 NOTE — Telephone Encounter (Signed)
Yes it was re-faxed.  TNP

## 2019-12-10 ENCOUNTER — Other Ambulatory Visit: Payer: Self-pay

## 2019-12-10 ENCOUNTER — Ambulatory Visit
Admission: RE | Admit: 2019-12-10 | Discharge: 2019-12-10 | Disposition: A | Discharge status: Home / Self Care | Payer: Medicare Other | Source: Ambulatory Visit | Attending: Physician Assistant | Admitting: Physician Assistant

## 2019-12-10 DIAGNOSIS — R2681 Unsteadiness on feet: Secondary | ICD-10-CM | POA: Diagnosis not present

## 2019-12-10 DIAGNOSIS — R262 Difficulty in walking, not elsewhere classified: Secondary | ICD-10-CM | POA: Diagnosis not present

## 2019-12-10 DIAGNOSIS — F39 Unspecified mood [affective] disorder: Secondary | ICD-10-CM | POA: Diagnosis not present

## 2019-12-10 DIAGNOSIS — I10 Essential (primary) hypertension: Secondary | ICD-10-CM | POA: Diagnosis not present

## 2019-12-10 DIAGNOSIS — M546 Pain in thoracic spine: Secondary | ICD-10-CM | POA: Diagnosis not present

## 2019-12-10 DIAGNOSIS — M4800 Spinal stenosis, site unspecified: Secondary | ICD-10-CM | POA: Diagnosis not present

## 2019-12-10 DIAGNOSIS — R0602 Shortness of breath: Secondary | ICD-10-CM

## 2019-12-11 DIAGNOSIS — I10 Essential (primary) hypertension: Secondary | ICD-10-CM | POA: Diagnosis not present

## 2019-12-11 DIAGNOSIS — R262 Difficulty in walking, not elsewhere classified: Secondary | ICD-10-CM | POA: Diagnosis not present

## 2019-12-11 DIAGNOSIS — M546 Pain in thoracic spine: Secondary | ICD-10-CM | POA: Diagnosis not present

## 2019-12-11 DIAGNOSIS — M4800 Spinal stenosis, site unspecified: Secondary | ICD-10-CM | POA: Diagnosis not present

## 2019-12-11 DIAGNOSIS — F39 Unspecified mood [affective] disorder: Secondary | ICD-10-CM | POA: Diagnosis not present

## 2019-12-11 DIAGNOSIS — R2681 Unsteadiness on feet: Secondary | ICD-10-CM | POA: Diagnosis not present

## 2019-12-12 ENCOUNTER — Telehealth: Payer: Self-pay

## 2019-12-12 NOTE — Telephone Encounter (Signed)
Copied from [place] [id number]. Topic: General - Other >> Dec 12, 2019  3:25 PM Wynetta Emery, [place] C wrote: Reason for CRM: pt's daughter called in for pt's imaging results. Please assist.    CB: [phone]

## 2019-12-12 NOTE — Telephone Encounter (Signed)
Please review for Jenni.   Thanks,   -Daryan Buell  

## 2019-12-15 ENCOUNTER — Telehealth: Payer: Self-pay

## 2019-12-15 DIAGNOSIS — R2681 Unsteadiness on feet: Secondary | ICD-10-CM | POA: Diagnosis not present

## 2019-12-15 DIAGNOSIS — M546 Pain in thoracic spine: Secondary | ICD-10-CM | POA: Diagnosis not present

## 2019-12-15 DIAGNOSIS — F39 Unspecified mood [affective] disorder: Secondary | ICD-10-CM | POA: Diagnosis not present

## 2019-12-15 DIAGNOSIS — M4800 Spinal stenosis, site unspecified: Secondary | ICD-10-CM | POA: Diagnosis not present

## 2019-12-15 DIAGNOSIS — R262 Difficulty in walking, not elsewhere classified: Secondary | ICD-10-CM | POA: Diagnosis not present

## 2019-12-15 DIAGNOSIS — R413 Other amnesia: Secondary | ICD-10-CM

## 2019-12-15 DIAGNOSIS — I10 Essential (primary) hypertension: Secondary | ICD-10-CM | POA: Diagnosis not present

## 2019-12-15 NOTE — Telephone Encounter (Signed)
Please advise 

## 2019-12-15 NOTE — Telephone Encounter (Signed)
Copied from [place] [id number]. Topic: General - Other >> Dec 15, 2019  2:05 PM Celene Kras wrote: Reason for CRM: Racheal Patches, social worker from [facility], called stating that the pt is ready to leave the niursing facility, but that they have concerns as the pt did not do well on the cognitive interview. She is requesting a call back to discuss with pcp. Please advise.

## 2019-12-16 NOTE — Telephone Encounter (Signed)
Can someone please call and get more information since I am not in the office. If needed I can call her from my personal cell phone

## 2019-12-17 DIAGNOSIS — M4800 Spinal stenosis, site unspecified: Secondary | ICD-10-CM | POA: Diagnosis not present

## 2019-12-17 DIAGNOSIS — R262 Difficulty in walking, not elsewhere classified: Secondary | ICD-10-CM | POA: Diagnosis not present

## 2019-12-17 DIAGNOSIS — R2681 Unsteadiness on feet: Secondary | ICD-10-CM | POA: Diagnosis not present

## 2019-12-17 DIAGNOSIS — M546 Pain in thoracic spine: Secondary | ICD-10-CM | POA: Diagnosis not present

## 2019-12-17 DIAGNOSIS — F39 Unspecified mood [affective] disorder: Secondary | ICD-10-CM | POA: Diagnosis not present

## 2019-12-17 DIAGNOSIS — I10 Essential (primary) hypertension: Secondary | ICD-10-CM | POA: Diagnosis not present

## 2019-12-17 NOTE — Telephone Encounter (Signed)
Fleet Contras Social Worker from [place] called back in reference to previous message left on 12/15/19 Chelsea [name] can be reached at Ph# [phone]. Asking for a call back today if possible please

## 2019-12-18 ENCOUNTER — Telehealth: Payer: Self-pay

## 2019-12-18 DIAGNOSIS — M546 Pain in thoracic spine: Secondary | ICD-10-CM | POA: Diagnosis not present

## 2019-12-18 NOTE — Telephone Encounter (Signed)
LMTCB, PEC may give result.  

## 2019-12-18 NOTE — Telephone Encounter (Signed)
There is another phone message on the same issue where I stated she would need a neurology referral.

## 2019-12-18 NOTE — Telephone Encounter (Signed)
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 There is also scarring noted from previous infections or inflammation. Also noted to have a moderately sized hiatal hernia (where the stomach is rising into the chest cavity). This can cause heartburn/GERD symptoms and can also lead to possible aspirations when she eats or drinks. This can lead to atypical pneumonias. I would recommend to be referred to pulmonology for the MAC infection/appearance and consider swallowing study for aspiration risk. If agreeable will order both.

## 2019-12-18 NOTE — Telephone Encounter (Signed)
Spoke with [place] worker from [facility] and she is asking if the provider do this type of assesment on patient or do they need to seek help from someone else like a cognitive/ capacity evaluation.

## 2019-12-18 NOTE — Addendum Note (Signed)
Addended by: Mar Daring on: 12/18/2019 12:24 PM   Modules accepted: Orders

## 2019-12-18 NOTE — Telephone Encounter (Signed)
I spoke with Evlyn Clines (the Education officer, museum at [facility]). Ms. Mcmaster is in skilled nursing right now, and she is determined to go home some.  Evlyn Clines states Ms Starn lives alone and is concerned for her safety.  She says Ms. Kuzel's memory has declined significantly.  She is wanting to know if there is a way for Ms. Whitcome to be deemed incompetent so she can remain in skilled nursing.  She states the only relative is a daughter that lives in [place] and she will not be help.   Porsha advised that Ms Riding will need to be referred to Neurology to be evaluated for memory loss.   Thanks,   -Mickel Baas

## 2019-12-19 DIAGNOSIS — M546 Pain in thoracic spine: Secondary | ICD-10-CM | POA: Diagnosis not present

## 2019-12-19 NOTE — Telephone Encounter (Signed)
Phone call to pt.  Left vm to return call to office to discuss CT results.

## 2019-12-19 NOTE — Telephone Encounter (Signed)
Patient and daughter called, left VM to return the call to the office for CT results.

## 2019-12-22 DIAGNOSIS — M546 Pain in thoracic spine: Secondary | ICD-10-CM | POA: Diagnosis not present

## 2019-12-23 DIAGNOSIS — M546 Pain in thoracic spine: Secondary | ICD-10-CM | POA: Diagnosis not present

## 2019-12-24 DIAGNOSIS — M546 Pain in thoracic spine: Secondary | ICD-10-CM | POA: Diagnosis not present

## 2019-12-25 DIAGNOSIS — M546 Pain in thoracic spine: Secondary | ICD-10-CM | POA: Diagnosis not present

## 2019-12-25 NOTE — Telephone Encounter (Addendum)
Was able to get in contact with patient's daughter Anderson [place] (on [place]) and advised her of CT results. Per daughter she is going to talk to the Education officer, museum and will get back to [place]. She also apologized for not returning the phone calls. When she calls back [facility] for [facility] nurse to give the information again if needed or get information.

## 2019-12-26 DIAGNOSIS — M546 Pain in thoracic spine: Secondary | ICD-10-CM | POA: Diagnosis not present

## 2019-12-29 DIAGNOSIS — M546 Pain in thoracic spine: Secondary | ICD-10-CM | POA: Diagnosis not present

## 2019-12-30 DIAGNOSIS — M546 Pain in thoracic spine: Secondary | ICD-10-CM | POA: Diagnosis not present

## 2019-12-30 DIAGNOSIS — N390 Urinary tract infection, site not specified: Secondary | ICD-10-CM | POA: Diagnosis not present

## 2019-12-30 DIAGNOSIS — R413 Other amnesia: Secondary | ICD-10-CM | POA: Diagnosis not present

## 2019-12-31 DIAGNOSIS — M546 Pain in thoracic spine: Secondary | ICD-10-CM | POA: Diagnosis not present

## 2020-01-01 DIAGNOSIS — M546 Pain in thoracic spine: Secondary | ICD-10-CM | POA: Diagnosis not present

## 2020-01-02 DIAGNOSIS — B351 Tinea unguium: Secondary | ICD-10-CM | POA: Diagnosis not present

## 2020-01-07 DIAGNOSIS — M546 Pain in thoracic spine: Secondary | ICD-10-CM | POA: Diagnosis not present

## 2020-01-08 DIAGNOSIS — M546 Pain in thoracic spine: Secondary | ICD-10-CM | POA: Diagnosis not present

## 2020-01-09 DIAGNOSIS — M546 Pain in thoracic spine: Secondary | ICD-10-CM | POA: Diagnosis not present

## 2020-01-12 DIAGNOSIS — M546 Pain in thoracic spine: Secondary | ICD-10-CM | POA: Diagnosis not present

## 2020-01-13 DIAGNOSIS — M546 Pain in thoracic spine: Secondary | ICD-10-CM | POA: Diagnosis not present

## 2020-01-14 DIAGNOSIS — H90A31 Mixed conductive and sensorineural hearing loss, unilateral, right ear with restricted hearing on the contralateral side: Secondary | ICD-10-CM | POA: Diagnosis not present

## 2020-01-14 DIAGNOSIS — H6123 Impacted cerumen, bilateral: Secondary | ICD-10-CM | POA: Diagnosis not present

## 2020-01-14 DIAGNOSIS — H903 Sensorineural hearing loss, bilateral: Secondary | ICD-10-CM | POA: Diagnosis not present

## 2020-01-15 DIAGNOSIS — E441 Mild protein-calorie malnutrition: Secondary | ICD-10-CM | POA: Diagnosis not present

## 2020-01-15 DIAGNOSIS — F39 Unspecified mood [affective] disorder: Secondary | ICD-10-CM | POA: Diagnosis not present

## 2020-01-15 DIAGNOSIS — M546 Pain in thoracic spine: Secondary | ICD-10-CM | POA: Diagnosis not present

## 2020-01-16 DIAGNOSIS — M546 Pain in thoracic spine: Secondary | ICD-10-CM | POA: Diagnosis not present

## 2020-01-20 DIAGNOSIS — M546 Pain in thoracic spine: Secondary | ICD-10-CM | POA: Diagnosis not present

## 2020-01-21 DIAGNOSIS — M546 Pain in thoracic spine: Secondary | ICD-10-CM | POA: Diagnosis not present

## 2020-01-23 DIAGNOSIS — M546 Pain in thoracic spine: Secondary | ICD-10-CM | POA: Diagnosis not present

## 2020-01-27 DIAGNOSIS — M546 Pain in thoracic spine: Secondary | ICD-10-CM | POA: Diagnosis not present

## 2020-01-28 DIAGNOSIS — M546 Pain in thoracic spine: Secondary | ICD-10-CM | POA: Diagnosis not present

## 2020-01-30 DIAGNOSIS — M546 Pain in thoracic spine: Secondary | ICD-10-CM | POA: Diagnosis not present

## 2020-02-03 DIAGNOSIS — M546 Pain in thoracic spine: Secondary | ICD-10-CM | POA: Diagnosis not present

## 2020-02-04 DIAGNOSIS — M546 Pain in thoracic spine: Secondary | ICD-10-CM | POA: Diagnosis not present

## 2020-02-06 DIAGNOSIS — M546 Pain in thoracic spine: Secondary | ICD-10-CM | POA: Diagnosis not present

## 2020-02-19 DIAGNOSIS — F39 Unspecified mood [affective] disorder: Secondary | ICD-10-CM | POA: Diagnosis not present

## 2020-02-19 DIAGNOSIS — I10 Essential (primary) hypertension: Secondary | ICD-10-CM | POA: Diagnosis not present

## 2020-02-19 DIAGNOSIS — I25119 Atherosclerotic heart disease of native coronary artery with unspecified angina pectoris: Secondary | ICD-10-CM | POA: Diagnosis not present

## 2020-02-19 DIAGNOSIS — F0150 Vascular dementia without behavioral disturbance: Secondary | ICD-10-CM | POA: Diagnosis not present

## 2020-02-19 DIAGNOSIS — J439 Emphysema, unspecified: Secondary | ICD-10-CM | POA: Diagnosis not present

## 2020-02-19 DIAGNOSIS — E441 Mild protein-calorie malnutrition: Secondary | ICD-10-CM | POA: Diagnosis not present

## 2020-02-24 DIAGNOSIS — J441 Chronic obstructive pulmonary disease with (acute) exacerbation: Secondary | ICD-10-CM | POA: Diagnosis not present

## 2020-03-29 DIAGNOSIS — F39 Unspecified mood [affective] disorder: Secondary | ICD-10-CM | POA: Diagnosis not present

## 2020-03-31 DIAGNOSIS — G4733 Obstructive sleep apnea (adult) (pediatric): Secondary | ICD-10-CM | POA: Diagnosis not present

## 2020-03-31 DIAGNOSIS — I10 Essential (primary) hypertension: Secondary | ICD-10-CM | POA: Diagnosis not present

## 2020-03-31 DIAGNOSIS — K5904 Chronic idiopathic constipation: Secondary | ICD-10-CM | POA: Diagnosis not present

## 2020-03-31 DIAGNOSIS — J449 Chronic obstructive pulmonary disease, unspecified: Secondary | ICD-10-CM | POA: Diagnosis not present

## 2020-03-31 DIAGNOSIS — I672 Cerebral atherosclerosis: Secondary | ICD-10-CM | POA: Diagnosis not present

## 2020-03-31 DIAGNOSIS — E43 Unspecified severe protein-calorie malnutrition: Secondary | ICD-10-CM | POA: Diagnosis not present

## 2020-03-31 DIAGNOSIS — E039 Hypothyroidism, unspecified: Secondary | ICD-10-CM | POA: Diagnosis not present

## 2020-03-31 DIAGNOSIS — F39 Unspecified mood [affective] disorder: Secondary | ICD-10-CM | POA: Diagnosis not present

## 2020-03-31 DIAGNOSIS — F0150 Vascular dementia without behavioral disturbance: Secondary | ICD-10-CM | POA: Diagnosis not present

## 2020-03-31 DIAGNOSIS — I2510 Atherosclerotic heart disease of native coronary artery without angina pectoris: Secondary | ICD-10-CM | POA: Diagnosis not present

## 2020-03-31 NOTE — Telephone Encounter (Signed)
error 

## 2020-04-19 DIAGNOSIS — H6502 Acute serous otitis media, left ear: Secondary | ICD-10-CM | POA: Diagnosis not present

## 2020-05-19 DEATH — deceased

## 2020-06-23 IMAGING — CT NM PET TUM IMG INITIAL (PI) SKULL BASE T - THIGH
9 of 10 series · 19 of 25 positions shown · non-contrast
Comparison: Neck CT 02/06/2018.  Abdomen and pelvis CT 10/07/2017.

CLINICAL DATA: Initial treatment strategy for parotid gland tumor.

EXAM:
NUCLEAR MEDICINE PET SKULL BASE TO THIGH
TECHNIQUE: 5.8 mCi F-18 FDG was injected intravenously. Full-ring PET imaging
was performed from the skull base to thigh after the radiotracer. CT
data was obtained and used for attenuation correction and anatomic
localization.
Fasting blood glucose: 93 mg/dl

[Series 3: ct wb 5.0 b30f · axial · 5.0mm · 0.98mm/px · z∈[-1097,-665]mm · 2 of 290 slices shown]
[im 1/290]
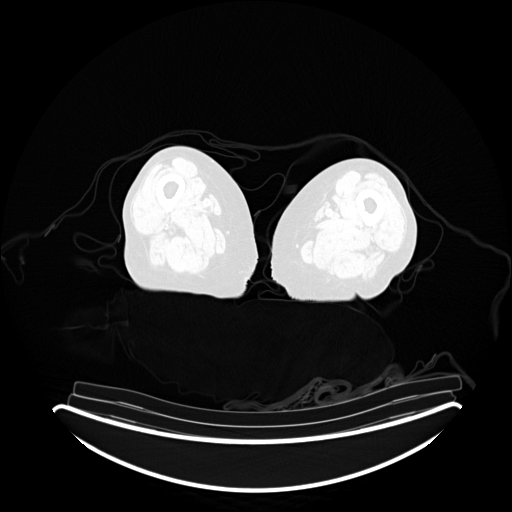
[im 145/290]
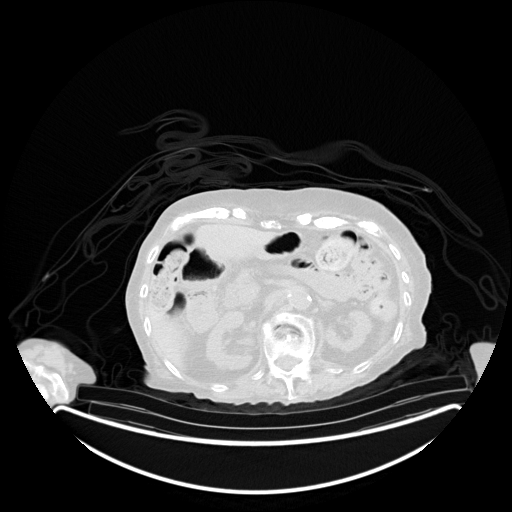

[Series 4: pet wb (ac) · axial · 5.0mm · 2.72mm/px · z∈[-1097,-230]mm · 3 of 290 slices shown]
[im 1/290]
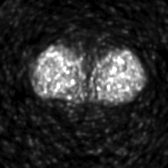
[im 145/290]
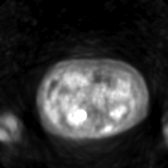
[im 290/290]
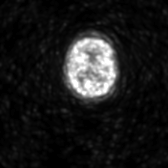

[Series 5: pet wb uncorrected (nac) · axial · 5.0mm · 4.07mm/px · z∈[-809,-230]mm · 3 of 290 slices shown]
[im 97/290]
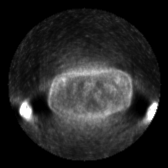
[im 193/290]
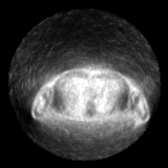
[im 290/290]
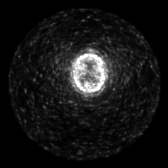

[Series 603: pet_ct axial fused · 3 of 290 slices shown]
[im 97/290]
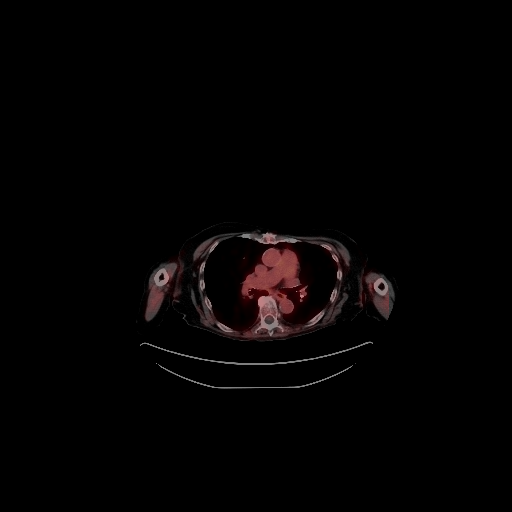
[im 193/290]
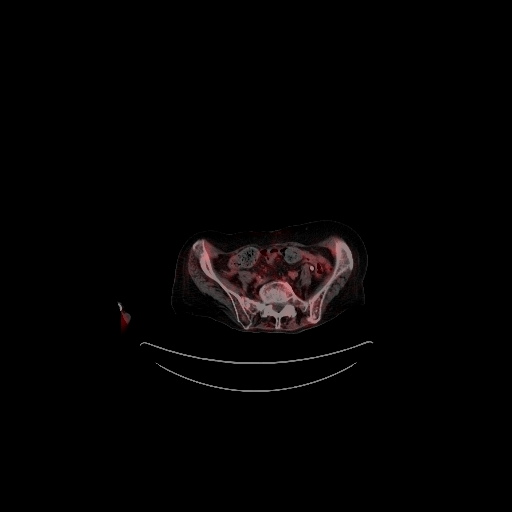
[im 290/290]
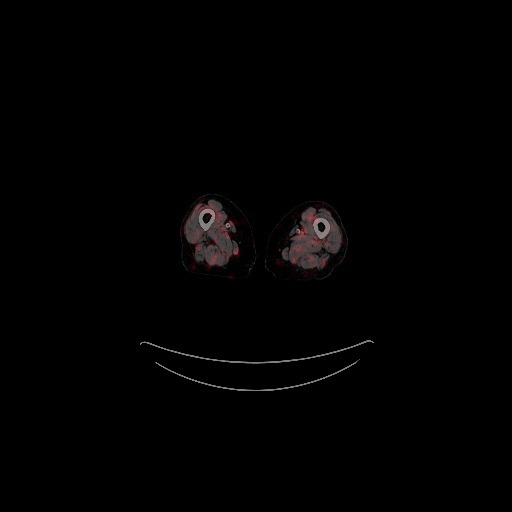

[Series 605: pet_ct sagittal fused · 2 of 157 slices shown]
[im 1/157]
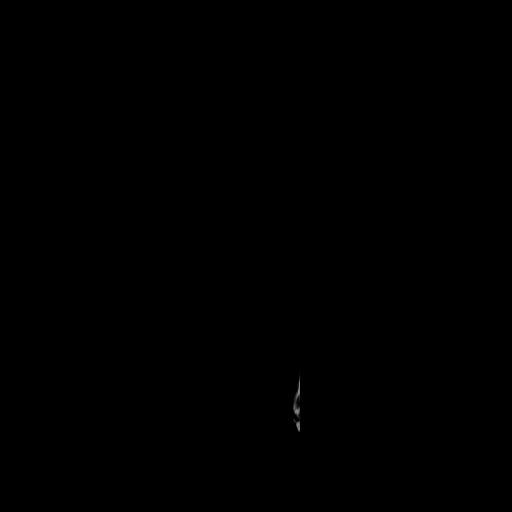
[im 157/157]
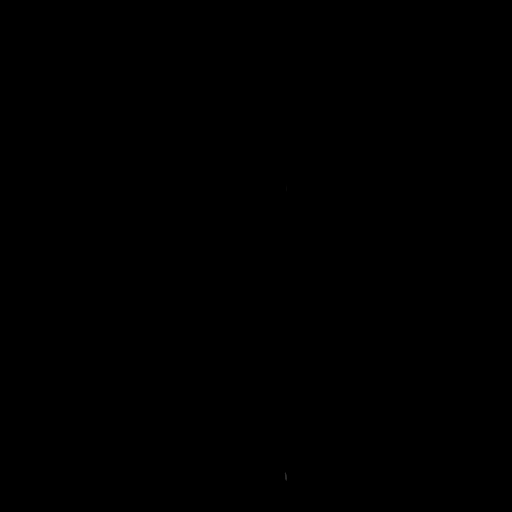

[Series 606: pet axial · 3 of 284 slices shown]
[im 1/284]
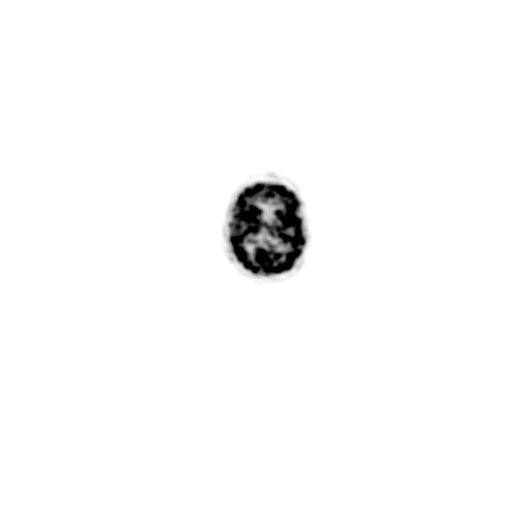
[im 189/284]
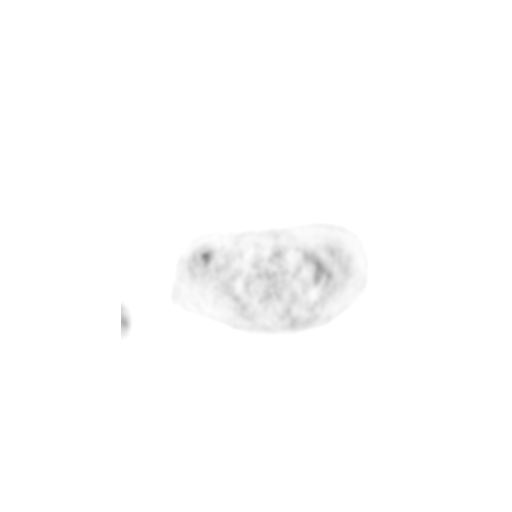
[im 284/284]
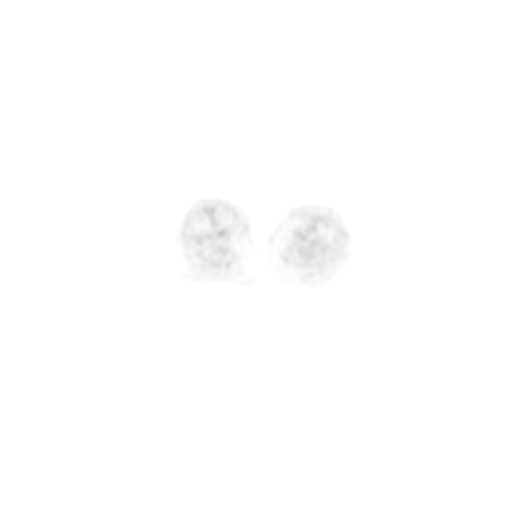

[Series 607: pet coronal · 1 of 106 slices shown]
[im 1/106]
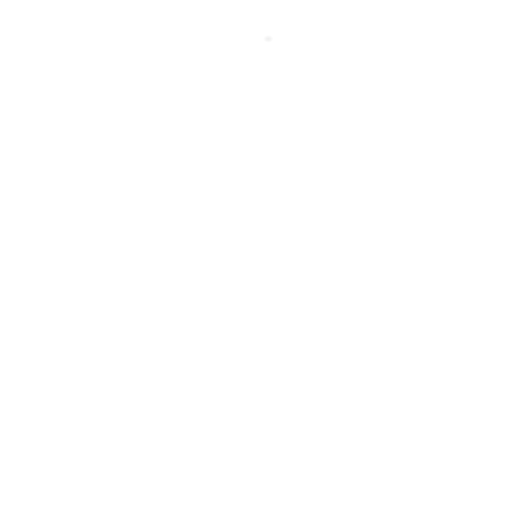

[Series 608: pet sagittal · 1 of 152 slices shown]
[im 152/152]
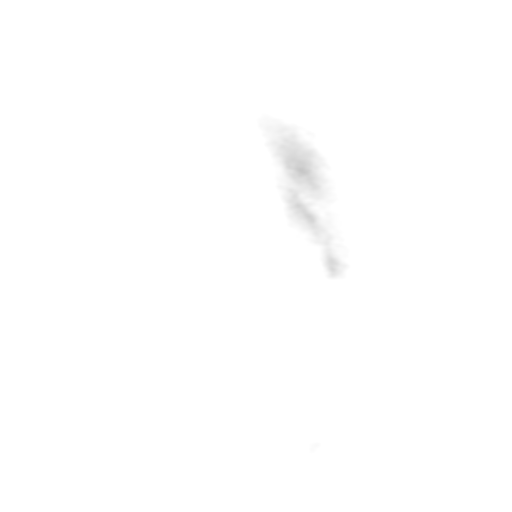

[Series 1069: results mm oncology reading · 1.0mm · 0.51mm/px · 1 of 5 slices shown]
[im 1/5]
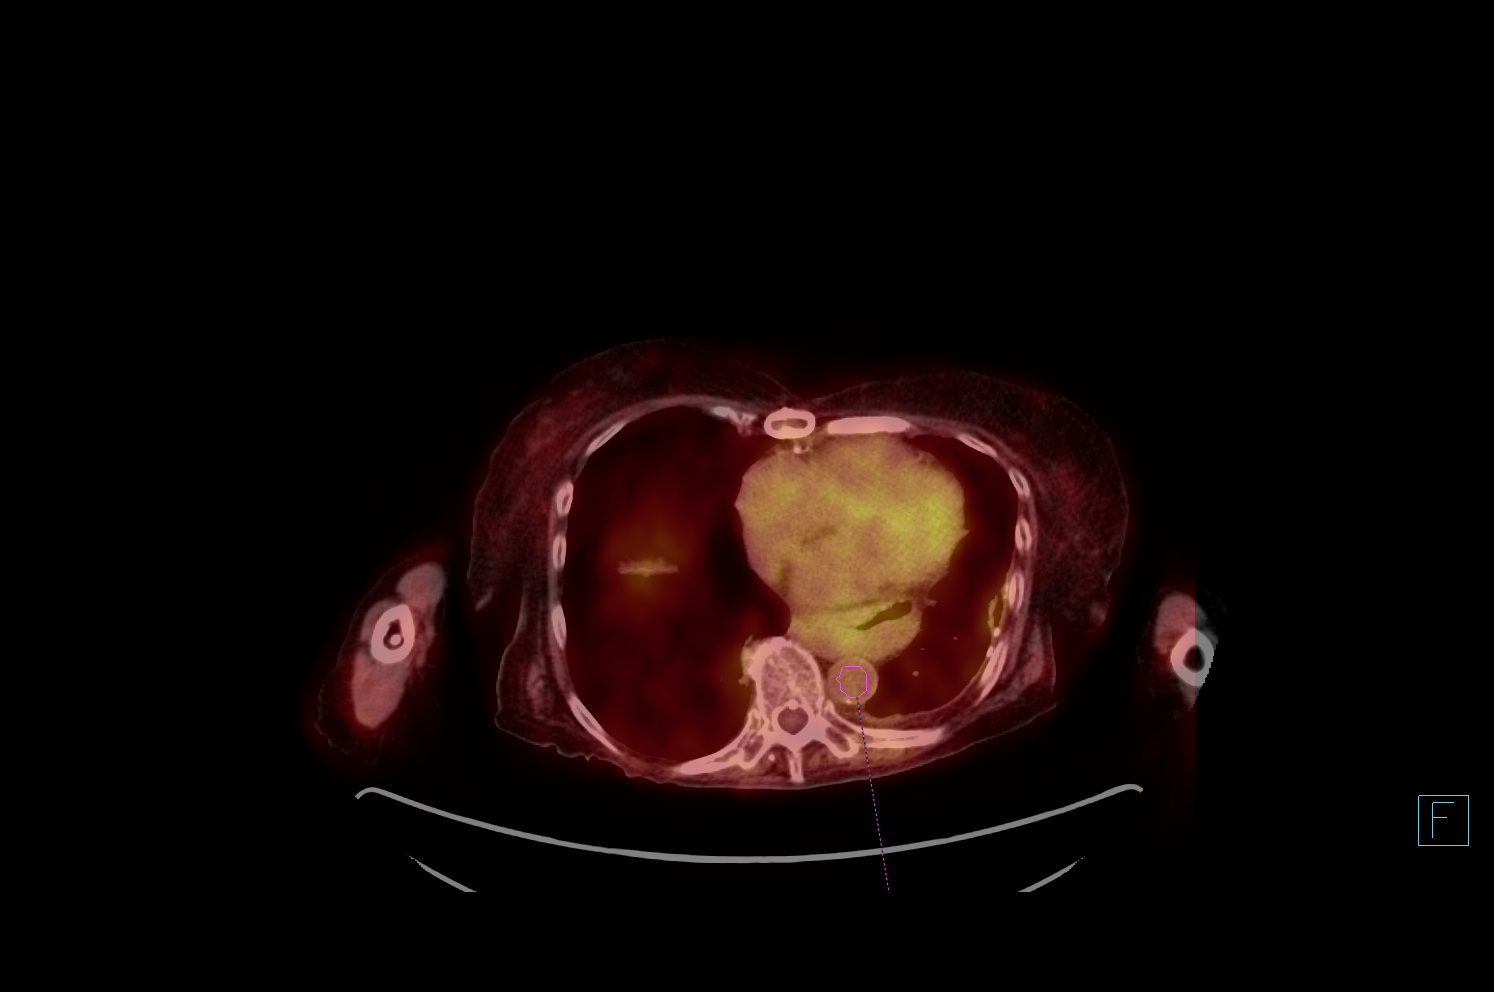

[19 of 25 positions shown; findings below may reference images not displayed]

FINDINGS: Mediastinal blood pool activity: SUV max

NECK: Hypermetabolic lesion right parotid gland is hypermetabolic
with SUV max = 7.3. No hypermetabolic cervical lymphadenopathy.

Incidental CT findings: none

CHEST: Focal hypermetabolic uptake in the right hilum demonstrates
SUV max = 4.4. No underlying lymph node evident on noncontrast CT
dated for attenuation correction. 9 mm short axis precarinal lymph
node shows low level FDG uptake with SUV max = 2.9, nonspecific.
This lymph node was present on a study from 07/01/2014 and is
unchanged in the interval. No other unexpected in hypermetabolism
within the chest.

Incidental CT findings: Status post median sternotomy. Coronary
artery calcification is evident. Atherosclerotic calcification is
noted in the wall of the thoracic aorta.

ABDOMEN/PELVIS: No abnormal hypermetabolic activity within the
liver, pancreas, adrenal glands, or spleen. 8 mm short axis central
small bowel mesenteric lymph node (173/3) shows low level FDG uptake
with SUV max = 3.0. This lymph node is stable compared to CT scan of
02/10/2015 and is associated with other central mesenteric lymph
nodes and mesenteric calcification, potentially reflecting sequelae
of prior infection/inflammation. No hypermetabolic lymph nodes in
the pelvis.

Incidental CT findings: Moderate hiatal hernia. There is abdominal
aortic atherosclerosis without aneurysm.

SKELETON: No focal hypermetabolic activity to suggest skeletal
metastasis.

Incidental CT findings: Status post right hip replacement. Marked
osteopenia with multilevel thoracolumbar compression deformities
noted on previous diagnostic CT scans.
IMPRESSION: 1. Hypermetabolic right parotid lesion consistent with neoplasm
without definite metastatic disease.
2. Low level, indeterminate uptake in the right hilum and precarinal
space of the mediastinum. No underlying lymph node visible in the
right hilum and the 9 mm short axis precarinal lymph node has been
stable since 07/01/2014. Attention on follow-up suggested.
3.  Aortic Atherosclerois ([id number])
4. Hiatal hernia.

## 2020-07-28 ENCOUNTER — Ambulatory Visit: Payer: Medicare Other | Admitting: Radiation Oncology
